# Patient Record
Sex: Male | Born: 1945 | Race: White | Hispanic: No | Marital: Married | Smoking: Former smoker
Health system: Southern US, Community
[De-identification: ages and names within clinical notes are randomized; demographics above are authoritative.]

## PROBLEM LIST (undated history)

## (undated) DIAGNOSIS — I639 Cerebral infarction, unspecified: Secondary | ICD-10-CM

## (undated) DIAGNOSIS — I350 Nonrheumatic aortic (valve) stenosis: Secondary | ICD-10-CM

## (undated) DIAGNOSIS — N183 Chronic kidney disease, stage 3 (moderate): Secondary | ICD-10-CM

## (undated) DIAGNOSIS — Z973 Presence of spectacles and contact lenses: Secondary | ICD-10-CM

## (undated) DIAGNOSIS — J111 Influenza due to unidentified influenza virus with other respiratory manifestations: Secondary | ICD-10-CM

## (undated) DIAGNOSIS — I77811 Abdominal aortic ectasia: Secondary | ICD-10-CM

## (undated) DIAGNOSIS — M1990 Unspecified osteoarthritis, unspecified site: Secondary | ICD-10-CM

## (undated) DIAGNOSIS — L409 Psoriasis, unspecified: Secondary | ICD-10-CM

## (undated) DIAGNOSIS — R55 Syncope and collapse: Secondary | ICD-10-CM

## (undated) DIAGNOSIS — I2510 Atherosclerotic heart disease of native coronary artery without angina pectoris: Secondary | ICD-10-CM

## (undated) DIAGNOSIS — G4730 Sleep apnea, unspecified: Secondary | ICD-10-CM

## (undated) DIAGNOSIS — I10 Essential (primary) hypertension: Secondary | ICD-10-CM

## (undated) DIAGNOSIS — K219 Gastro-esophageal reflux disease without esophagitis: Secondary | ICD-10-CM

## (undated) DIAGNOSIS — H25813 Combined forms of age-related cataract, bilateral: Secondary | ICD-10-CM

## (undated) DIAGNOSIS — R011 Cardiac murmur, unspecified: Secondary | ICD-10-CM

## (undated) DIAGNOSIS — H35033 Hypertensive retinopathy, bilateral: Secondary | ICD-10-CM

## (undated) DIAGNOSIS — H40003 Preglaucoma, unspecified, bilateral: Secondary | ICD-10-CM

## (undated) DIAGNOSIS — H43812 Vitreous degeneration, left eye: Secondary | ICD-10-CM

## (undated) DIAGNOSIS — Z860100 Personal history of colon polyps, unspecified: Secondary | ICD-10-CM

## (undated) DIAGNOSIS — I6529 Occlusion and stenosis of unspecified carotid artery: Secondary | ICD-10-CM

## (undated) DIAGNOSIS — R42 Dizziness and giddiness: Secondary | ICD-10-CM

## (undated) DIAGNOSIS — F341 Dysthymic disorder: Secondary | ICD-10-CM

## (undated) DIAGNOSIS — N529 Male erectile dysfunction, unspecified: Secondary | ICD-10-CM

## (undated) DIAGNOSIS — E785 Hyperlipidemia, unspecified: Secondary | ICD-10-CM

## (undated) DIAGNOSIS — Z87891 Personal history of nicotine dependence: Secondary | ICD-10-CM

## (undated) DIAGNOSIS — F482 Pseudobulbar affect: Secondary | ICD-10-CM

## (undated) DIAGNOSIS — J189 Pneumonia, unspecified organism: Secondary | ICD-10-CM

## (undated) HISTORY — PX: WISDOM TOOTH EXTRACTION: SHX21

## (undated) HISTORY — DX: Influenza due to unidentified influenza virus with other respiratory manifestations: J11.1

## (undated) HISTORY — DX: Dizziness and giddiness: R42

## (undated) HISTORY — PX: COLONOSCOPY W/ BIOPSIES AND POLYPECTOMY: SHX1376

## (undated) HISTORY — DX: Syncope and collapse: R55

## (undated) HISTORY — DX: Pneumonia, unspecified organism: J18.9

## (undated) HISTORY — DX: Male erectile dysfunction, unspecified: N52.9

## (undated) HISTORY — DX: Hyperlipidemia, unspecified: E78.5

## (undated) HISTORY — DX: Personal history of colonic polyps: Z86.010

## (undated) HISTORY — DX: Chronic kidney disease, stage 3 (moderate): N18.3

## (undated) HISTORY — DX: Dysthymic disorder: F34.1

## (undated) HISTORY — DX: Essential (primary) hypertension: I10

## (undated) HISTORY — DX: Preglaucoma, unspecified, bilateral: H40.003

## (undated) HISTORY — DX: Cerebral infarction, unspecified: I63.9

## (undated) HISTORY — DX: Combined forms of age-related cataract, bilateral: H25.813

## (undated) HISTORY — DX: Hypertensive retinopathy, bilateral: H35.033

## (undated) HISTORY — DX: Personal history of nicotine dependence: Z87.891

## (undated) HISTORY — DX: Abdominal aortic ectasia: I77.811

## (undated) HISTORY — PX: EYE SURGERY: SHX253

## (undated) HISTORY — DX: Personal history of colon polyps, unspecified: Z86.0100

## (undated) HISTORY — DX: Occlusion and stenosis of unspecified carotid artery: I65.29

## (undated) HISTORY — DX: Vitreous degeneration, left eye: H43.812

## (undated) HISTORY — DX: Pseudobulbar affect: F48.2

---

## 2013-12-30 ENCOUNTER — Encounter: Payer: Self-pay | Admitting: Emergency Medicine

## 2013-12-30 ENCOUNTER — Emergency Department
Admission: EM | Admit: 2013-12-30 | Discharge: 2013-12-30 | Disposition: A | Discharge status: Home / Self Care | Payer: PRIVATE HEALTH INSURANCE | Source: Home / Self Care | Attending: Family Medicine | Admitting: Family Medicine

## 2013-12-30 DIAGNOSIS — S61209A Unspecified open wound of unspecified finger without damage to nail, initial encounter: Secondary | ICD-10-CM

## 2013-12-30 DIAGNOSIS — IMO0001 Reserved for inherently not codable concepts without codable children: Secondary | ICD-10-CM

## 2013-12-30 HISTORY — DX: Psoriasis, unspecified: L40.9

## 2013-12-30 NOTE — Discharge Instructions (Signed)
Leave present dressing on wound for two days, then apply new Mepetel dressing and leave on for 3 days.  May apply Bacitracin ointment.  Keep wound clean and dry.  Return for any signs of infection (or follow-up with family doctor):  Increasing redness, swelling, pain, heat, drainage, etc. Return in 10 days for suture removal.     Laceration Care, Adult A laceration is a cut or lesion that goes through all layers of the skin and into the tissue just beneath the skin. TREATMENT  Some lacerations may not require closure. Some lacerations may not be able to be closed due to an increased risk of infection. It is important to see your caregiver as soon as possible after an injury to minimize the risk of infection and maximize the opportunity for successful closure. If closure is appropriate, pain medicines may be given, if needed. The wound will be cleaned to help prevent infection. Your caregiver will use stitches (sutures), staples, wound glue (adhesive), or skin adhesive strips to repair the laceration. These tools bring the skin edges together to allow for faster healing and a better cosmetic outcome. However, all wounds will heal with a scar. Once the wound has healed, scarring can be minimized by covering the wound with sunscreen during the day for 1 full year. HOME CARE INSTRUCTIONS  For sutures or staples:  Keep the wound clean and dry.  Wash the wound with soap and water. Rinse the wound off with water to remove all soap. Pat the wound dry with a clean towel.  After cleaning, apply a thin layer of the antibiotic ointment as recommended by your caregiver. This will help prevent infection and keep the dressing from sticking.   Do not soak the wound in water until the sutures are removed.  Only take over-the-counter or prescription medicines for pain, discomfort, or fever as directed by your caregiver.  Get your sutures or staples removed as directed by your caregiver.   You may need a  tetanus shot if:  You cannot remember when you had your last tetanus shot.  You have never had a tetanus shot. If you get a tetanus shot, your arm may swell, get red, and feel warm to the touch. This is common and not a problem. If you need a tetanus shot and you choose not to have one, there is a rare chance of getting tetanus. Sickness from tetanus can be serious. SEEK MEDICAL CARE IF:   You have redness, swelling, or increasing pain in the wound.  You see a red line that goes away from the wound.  You have yellowish-white fluid (pus) coming from the wound.  You have a fever.  You notice a bad smell coming from the wound or dressing.  Your wound breaks open before or after sutures have been removed.  You notice something coming out of the wound such as wood or glass.  Your wound is on your hand or foot and you cannot move a finger or toe. SEEK IMMEDIATE MEDICAL CARE IF:   Your pain is not controlled with prescribed medicine.  You have severe swelling around the wound causing pain and numbness or a change in color in your arm, hand, leg, or foot.  Your wound splits open and starts bleeding.  You have worsening numbness, weakness, or loss of function of any joint around or beyond the wound.  You develop painful lumps near the wound or on the skin anywhere on your body. MAKE SURE YOU:   Understand  these instructions.  Will watch your condition.  Will get help right away if you are not doing well or get worse. Document Released: 07/23/2005 Document Revised: 10/15/2011 Document Reviewed: 01/16/2011 [facility] Patient Information 2014 [place], [place].

## 2013-12-30 NOTE — ED Notes (Addendum)
Laceration to R ring finger from releasing a ratchet strap too quickly.   .Occured 12/29/13 @ 8 pm.  Bleeding stopped and site cleansed with Hibiclens. Tetanus UTD.

## 2013-12-30 NOTE — ED Provider Notes (Signed)
CSN: [id number]     [id number] date & time 12/30/13  1236 History   First MD Initiated Contact with Patient 12/30/13 1302     Chief Complaint  Patient presents with  . Extremity Laceration      HPI Comments: Patient injured his right fourth finger on the ratcheting mechanism of a tie-down strap.  Last Tdap was in 2012.  Patient is a 68 y.o. male presenting with skin laceration. The history is provided by the patient.  Laceration Location:  Finger Finger laceration location:  R ring finger Length (cm):  1.5 Depth:  Through dermis Bleeding: controlled   Time since incident:  2 hours Laceration mechanism:  Metal edge Pain details:    Quality:  Aching   Severity:  Mild   Timing:  Constant   Progression:  Unchanged Foreign body present:  No foreign bodies Relieved by:  Nothing Worsened by:  Movement Ineffective treatments:  None tried Tetanus status:  Up to date   Past Medical History  Diagnosis Date  . Psoriasis    History reviewed. No pertinent past surgical history. Family History  Problem Relation Age of Onset  . Cancer Mother     Uterine CA  . Cancer Father     Prostate   History  Substance Use Topics  . Smoking status: Former Games developermoker  . Smokeless tobacco: Never Used  . Alcohol Use: Yes    Review of Systems  All other systems reviewed and are negative.   Allergies  Review of patient's allergies indicates no known allergies.  Home Medications   Prior to Admission medications   Not on File   BP 187/80  Pulse 69  Temp(Src) 98.2 F (36.8 C) (Oral)  Ht 5' 8.5" (1.74 m)  Wt 188 lb (85.276 kg)  BMI 28.17 kg/m2  SpO2 99% Physical Exam  Nursing note and vitals reviewed. Constitutional: He is oriented to person, place, and time. He appears well-developed and well-nourished. No distress.  HENT:  Head: Atraumatic.  Eyes: Conjunctivae are normal. Pupils are equal, round, and reactive to light.  Musculoskeletal:       Right hand: He exhibits tenderness and  laceration. He exhibits normal range of motion, no bony tenderness, normal two-point discrimination, normal capillary refill, no deformity and no swelling. Normal sensation noted. Normal strength noted.       Hands: The palmar surface of the right 4th finger middle phalanx has a 1cm by 1.5cm simple flap laceration.  Finger has full range of motion.  Distal neurovascular function is intact.   Neurological: He is alert and oriented to person, place, and time.  Skin: Skin is warm and dry.    ED Course  Procedures  Laceration Repair Discussed benefits and risks of procedure and verbal consent obtained. Using sterile technique and digital 2% lidocaine without epinephrine, cleansed wound with Betadine followed by copious lavage with normal saline.  Wound carefully inspected for debris and foreign bodies; none found.  Wound closed with #3, 5-0 interrupted nylon sutures.   Non-stick Mepetel Lite dreessing applied.  Wound precautions explained to patient.  Return for suture removal in 10 days.           MDM   1. Laceration of fourth finger of right hand     Leave present dressing on wound for two days, then apply new Mepetel dressing and leave on for 3 days.  May apply Bacitracin ointment.  Keep wound clean and dry.  Return for any signs of infection (or follow-up with  family doctor):  Increasing redness, swelling, pain, heat, drainage, etc. Return in 10 days for suture removal.      Lattie Haw, MD 12/30/13 [phone]

## 2014-01-05 ENCOUNTER — Ambulatory Visit: Payer: PRIVATE HEALTH INSURANCE | Admitting: Family Medicine

## 2014-01-07 ENCOUNTER — Encounter: Payer: PRIVATE HEALTH INSURANCE | Admitting: Family Medicine

## 2014-01-09 ENCOUNTER — Encounter: Payer: Self-pay | Admitting: Emergency Medicine

## 2014-01-09 ENCOUNTER — Emergency Department
Admission: EM | Admit: 2014-01-09 | Discharge: 2014-01-09 | Disposition: A | Discharge status: Home / Self Care | Payer: PRIVATE HEALTH INSURANCE | Source: Home / Self Care | Attending: Family Medicine | Admitting: Family Medicine

## 2014-01-09 DIAGNOSIS — Z4802 Encounter for removal of sutures: Secondary | ICD-10-CM

## 2014-01-09 NOTE — Discharge Instructions (Signed)
Apply Bacitracin and bandage daily for two more days.  Begin finger stretching exercises.

## 2014-01-09 NOTE — ED Notes (Signed)
Here for removal of sutures from lateral R ring finger from 12/30/13.  Area cleaned with alcohol

## 2014-01-09 NOTE — ED Provider Notes (Signed)
CSN: [id number]     Arrival date & time 01/09/14  1019 History   First MD Initiated Contact with Patient 01/09/14 1039     Chief Complaint  Patient presents with  . Suture / Staple Removal      HPI Comments: Patient presents for suture removal.  He has no complaints.  The history is provided by the patient.    Past Medical History  Diagnosis Date  . Psoriasis   . Hypertension    No past surgical history on file. Family History  Problem Relation Age of Onset  . Cancer Mother     Uterine CA  . Cancer Father     Prostate   History  Substance Use Topics  . Smoking status: Former Games developer  . Smokeless tobacco: Never Used  . Alcohol Use: Yes    Review of Systems Right 4th finger has no pain.  No recent drainage from wound Allergies  Review of patient's allergies indicates no known allergies.  Home Medications   Prior to Admission medications   Medication Sig Start Date End Date Taking? Authorizing Provider  UNKNOWN TO PATIENT Topical ointment for Psoriasis    Historical Provider, MD   BP 162/84  Pulse 68  Temp(Src) 97.7 F (36.5 C) (Oral)  Ht 5' 8.5" (1.74 m)  Wt 190 lb 4 oz (86.297 kg)  BMI 28.50 kg/m2  SpO2 98% Physical Exam Nursing notes and Vital Signs reviewed. Appearance:  Patient appears healthy, stated age, and in no acute distress Right 4th finger:  Sutured wound has no drainage or swelling.  No erythema.  Finger has good range of motion.  ED Course  Procedures  none     MDM   1. Visit for suture removal; healing well.  No evidence infection   Sutures removed Bacitracin and bandage applied.  Apply Bacitracin and bandage daily for two more days.  Begin finger stretching exercises.   Lattie Haw, MD 01/09/14 1050

## 2014-09-16 ENCOUNTER — Emergency Department
Admission: EM | Admit: 2014-09-16 | Discharge: 2014-09-16 | Disposition: A | Discharge status: Home / Self Care | Payer: Medicare Other | Source: Home / Self Care | Attending: Emergency Medicine | Admitting: Emergency Medicine

## 2014-09-16 ENCOUNTER — Encounter: Payer: Self-pay | Admitting: *Deleted

## 2014-09-16 DIAGNOSIS — H109 Unspecified conjunctivitis: Secondary | ICD-10-CM | POA: Diagnosis not present

## 2014-09-16 MED ORDER — TOBRAMYCIN 0.3 % OP SOLN: 2.0000 [drp] | OPHTHALMIC | Status: DC

## 2014-09-16 NOTE — ED Notes (Signed)
Leonard Padilla c/o right eye pain and drainage x 3 days.

## 2014-09-16 NOTE — Discharge Instructions (Signed)

## 2014-09-16 NOTE — ED Provider Notes (Signed)
CSN: [id number]     Arrival date & time 09/16/14  1338 History   First MD Initiated Contact with Patient 09/16/14 1411     Chief Complaint  Patient presents with  . Eye Drainage   (Consider location/radiation/quality/duration/timing/severity/associated sxs/prior Treatment) Patient is a 69 y.o. male presenting with conjunctivitis. The history is provided by the patient. No language interpreter was used.  Conjunctivitis This is a new problem. The current episode started more than 2 days ago. The problem occurs constantly. The problem has not changed since onset.Nothing aggravates the symptoms. He has tried nothing for the symptoms. The treatment provided no relief.  normal eye exam and normal pressures 8 months ago  Past Medical History  Diagnosis Date  . Psoriasis    History reviewed. No pertinent past surgical history. Family History  Problem Relation Age of Onset  . Cancer Mother     Uterine CA  . Cancer Father     Prostate   History  Substance Use Topics  . Smoking status: Former Games developermoker  . Smokeless tobacco: Never Used  . Alcohol Use: Yes    Review of Systems  All other systems reviewed and are negative.   Allergies  Review of patient's allergies indicates no known allergies.  Home Medications   Prior to Admission medications   Medication Sig Start Date End Date Taking? Authorizing Provider  tobramycin (TOBREX) 0.3 % ophthalmic solution Place 2 drops into the right eye every 4 (four) hours. 09/16/14   Elson AreasLeslie K Elverta Dimiceli, PA-C  UNKNOWN TO PATIENT Topical ointment for Psoriasis    Historical Provider, MD   BP 162/96 mmHg  Pulse 84  Temp(Src) 97.8 F (36.6 C) (Oral)  Resp 14  Wt 204 lb (92.534 kg)  SpO2 99% Physical Exam  Constitutional: He is oriented to person, place, and time. He appears well-developed and well-nourished.  HENT:  Head: Normocephalic and atraumatic.  Eyes: Conjunctivae and EOM are normal. Pupils are equal, round, and reactive to light.  Injected  right conjunctiva  Neck: Normal range of motion.  Cardiovascular: Normal rate.   Pulmonary/Chest: Effort normal.  Abdominal: He exhibits no distension.  Musculoskeletal: Normal range of motion.  Neurological: He is alert and oriented to person, place, and time.  Skin: Skin is warm.  Psychiatric: He has a normal mood and affect.  Nursing note and vitals reviewed.   ED Course  Procedures (including critical care time) Labs Review Labs Reviewed - No data to display  Imaging Review No results found.   MDM   1. Conjunctivitis of right eye    Cool compresses Tobrex See your eye doctor for recheck if symptoms persist more than 3 days AVS   Elson AreasLeslie K Mosie Angus, PA-C 09/16/14 1439

## 2014-12-02 DIAGNOSIS — L409 Psoriasis, unspecified: Secondary | ICD-10-CM | POA: Insufficient documentation

## 2014-12-30 LAB — HM HEPATITIS C SCREENING LAB: HM Hepatitis Screen: NEGATIVE

## 2015-09-07 DIAGNOSIS — J189 Pneumonia, unspecified organism: Secondary | ICD-10-CM | POA: Insufficient documentation

## 2015-09-07 DIAGNOSIS — I639 Cerebral infarction, unspecified: Secondary | ICD-10-CM | POA: Insufficient documentation

## 2015-09-07 DIAGNOSIS — J111 Influenza due to unidentified influenza virus with other respiratory manifestations: Secondary | ICD-10-CM | POA: Insufficient documentation

## 2015-09-07 HISTORY — DX: Pneumonia, unspecified organism: J18.9

## 2015-09-07 HISTORY — DX: Influenza due to unidentified influenza virus with other respiratory manifestations: J11.1

## 2015-09-07 HISTORY — DX: Cerebral infarction, unspecified: I63.9

## 2015-09-30 LAB — LIPID PANEL
Cholesterol: 137 mg/dL (ref 0–200)
HDL: 43 mg/dL (ref 35–70)
LDL Cholesterol: 69 mg/dL
Triglycerides: 123 mg/dL (ref 40–160)

## 2015-09-30 LAB — BASIC METABOLIC PANEL
Calcium: 8.4 mg/dL
Creatinine: 1.1 mg/dL (ref <=1.3)
Glucose: 90 mg/dL
Potassium: 3.7 mmol/L (ref 3.4–5.3)

## 2015-09-30 LAB — COMPREHENSIVE METABOLIC PANEL
Alanine: 46
Alkaline Phosphatase: 66 U/L
Aspartate: 30

## 2015-09-30 LAB — CBC AND DIFFERENTIAL
Hemoglobin: 12.8 g/dL — AB (ref 13.5–17.5)
Platelets: 163 10*3/uL (ref 150–399)

## 2015-10-13 ENCOUNTER — Encounter: Payer: Self-pay | Admitting: Family Medicine

## 2015-10-13 ENCOUNTER — Ambulatory Visit (INDEPENDENT_AMBULATORY_CARE_PROVIDER_SITE_OTHER): Payer: Medicare Other | Admitting: Family Medicine

## 2015-10-13 VITALS — BP 131/86 | HR 86 | Wt 168.0 lb

## 2015-10-13 DIAGNOSIS — I10 Essential (primary) hypertension: Secondary | ICD-10-CM

## 2015-10-13 DIAGNOSIS — I6320 Cerebral infarction due to unspecified occlusion or stenosis of unspecified precerebral arteries: Secondary | ICD-10-CM

## 2015-10-13 DIAGNOSIS — K219 Gastro-esophageal reflux disease without esophagitis: Secondary | ICD-10-CM | POA: Insufficient documentation

## 2015-10-13 DIAGNOSIS — I639 Cerebral infarction, unspecified: Secondary | ICD-10-CM | POA: Insufficient documentation

## 2015-10-13 HISTORY — DX: Essential (primary) hypertension: I10

## 2015-10-13 LAB — LIPID PANEL
Cholesterol: 164 mg/dL (ref 125–200)
HDL: 53 mg/dL (ref >=40)
LDL Cholesterol: 78 mg/dL (ref <130)
Total CHOL/HDL Ratio: 3.1 Ratio (ref <=5.0)
Triglycerides: 167 mg/dL — ABNORMAL HIGH (ref <150)
VLDL: 33 mg/dL — ABNORMAL HIGH (ref <30)

## 2015-10-13 MED ORDER — OMEPRAZOLE 20 MG PO CPDR: 20.0000 mg | DELAYED_RELEASE_CAPSULE | Freq: Every day | ORAL | Status: DC

## 2015-10-13 NOTE — Progress Notes (Signed)
CC: Leonard Padilla is a 70 y.o. male is here for Establish Care   Subjective: HPI:  Very pleasant 70 year old here to establish care, husband of victoria.  Complains of epigastric burning that today is absent but yesterday in a few days beforehand was causing some mild discomfort. Pain was nonradiating and worse after eating. No interventions as of yet. He denies any regurgitation or nauseousness.   Earlier in February [place] was [place]. He had an episode where he woke up from sleeping and had lost strength in both legs. He was seen at a local urgent care and diagnosed with pneumonia and influenza A. The next day symptoms were worsening and he was seen in the emergency room and admitted. He was still having the weakness and after slurred speech and left-sided facial droop were noticed he had an MRI done which revealed an ischemic stroke. He was then started on 81 mg of aspirin daily, HCTZ and atorvastatin. He is taking his medication as prescribed. He denies any known side effects. He continues to unintentionally veer to the left when walking and has some residual weakness in his left hand. Family's request a referral to neurology which seems reasonable. He was not taking aspirin prior to the stroke.  Review Of Systems Outlined In HPI  Past Medical History  Diagnosis Date  . Psoriasis   . Essential hypertension 10/13/2015  . Hyperlipidemia   . Stroke [facility](HCC)     History reviewed. No pertinent past surgical history. Family History  Problem Relation Age of Onset  . Cancer Mother     Uterine CA  . Cancer Father     Prostate    Social History   Social History  . Marital Status: Married    Spouse Name: N/A  . Number of Children: N/A  . Years of Education: N/A   Occupational History  . Not on file.   Social History Main Topics  . Smoking status: Former Smoker    Quit date: 08/06/1984  . Smokeless tobacco: Never Used  . Alcohol Use: Yes  . Drug Use: No  . Sexual  Activity: Not Currently   Other Topics Concern  . Not on file   Social History Narrative     Objective: BP 131/86 mmHg  Pulse 86  Wt 168 lb (76.204 kg)  General: Alert and Oriented, No Acute Distress HEENT: Pupils equal, round, reactive to light. Conjunctivae clear.  Moist membranes pharynx unremarkable Lungs: Clear to auscultation bilaterally, no wheezing/ronchi/rales.  Comfortable work of breathing. Good air movement. Cardiac: Regular rate and rhythm. Normal S1/S2.  No murmurs, rubs, nor gallops.   Abdomen: Soft nontender Extremities: No peripheral edema.  Strong peripheral pulses.  Gait normal Mental Status: No depression, anxiety, nor agitation. Skin: Warm and dry.  Assessment & Plan: Leonard Padilla was seen today for establish care.  Diagnoses and all orders for this visit:  Gastroesophageal reflux disease, esophagitis presence not specified  Cerebrovascular accident (CVA) due to occlusion of precerebral artery (HCC) -     Lipid panel -     Ambulatory referral to Neurology -     Ambulatory referral to Physical Therapy  Essential hypertension -     Lipid panel  Other orders -     omeprazole (PRILOSEC) 20 MG capsule; Take 1 capsule (20 mg total) by mouth daily.   GERD: Uncontrolled, starting Prilosec for at least a month now. CVA: Continue aspirin and statin. Continue on hydrochlorothiazide blood pressure is acceptable today. Checking LDL cholesterol today.  Referral to physical therapy Assessment hypertension: Controlled   Return in about 3 months (around 01/13/2016).

## 2015-10-14 ENCOUNTER — Other Ambulatory Visit: Payer: Self-pay

## 2015-10-14 ENCOUNTER — Encounter: Payer: Self-pay | Admitting: Family Medicine

## 2015-10-14 MED ORDER — HYDROCHLOROTHIAZIDE 25 MG PO TABS: 25.0000 mg | ORAL_TABLET | Freq: Every day | ORAL | Status: DC

## 2015-10-14 MED ORDER — ATORVASTATIN CALCIUM 10 MG PO TABS: 10.0000 mg | ORAL_TABLET | Freq: Every day | ORAL | Status: DC

## 2015-10-17 ENCOUNTER — Other Ambulatory Visit: Payer: Self-pay | Admitting: Family Medicine

## 2015-10-17 NOTE — Progress Notes (Signed)
As of today patient has changes minor now would like physical therapy to come home due to difficulty with transporting to and from outpatient physical therapy in our building.

## 2015-10-17 NOTE — Addendum Note (Signed)
Addended by: Laren BoomHOMMEL, Matan Steen on: 10/17/2015 04:03 PM   Modules accepted: Orders

## 2015-10-18 ENCOUNTER — Encounter: Payer: Self-pay | Admitting: Family Medicine

## 2015-10-26 ENCOUNTER — Telehealth: Payer: Self-pay | Admitting: *Deleted

## 2015-10-26 ENCOUNTER — Ambulatory Visit: Payer: Medicare Other | Admitting: Diagnostic Neuroimaging

## 2015-10-26 NOTE — Telephone Encounter (Signed)
[place]ed to [place]; number no longer in service.

## 2015-10-26 NOTE — Telephone Encounter (Signed)
LVM requesting call back to reschedule due to provider being out of office today with illness.

## 2015-11-01 ENCOUNTER — Ambulatory Visit (INDEPENDENT_AMBULATORY_CARE_PROVIDER_SITE_OTHER): Payer: Medicare Other | Admitting: Diagnostic Neuroimaging

## 2015-11-01 ENCOUNTER — Encounter: Payer: Self-pay | Admitting: Neurology

## 2015-11-01 ENCOUNTER — Ambulatory Visit (INDEPENDENT_AMBULATORY_CARE_PROVIDER_SITE_OTHER): Payer: Self-pay | Admitting: Neurology

## 2015-11-01 ENCOUNTER — Encounter: Payer: Self-pay | Admitting: Diagnostic Neuroimaging

## 2015-11-01 VITALS — BP 124/80 | HR 78 | Resp 16 | Ht 68.0 in | Wt 173.0 lb

## 2015-11-01 VITALS — BP 148/81 | HR 79 | Ht 68.0 in | Wt 173.6 lb

## 2015-11-01 DIAGNOSIS — R351 Nocturia: Secondary | ICD-10-CM

## 2015-11-01 DIAGNOSIS — R0683 Snoring: Secondary | ICD-10-CM | POA: Diagnosis not present

## 2015-11-01 DIAGNOSIS — I10 Essential (primary) hypertension: Secondary | ICD-10-CM

## 2015-11-01 DIAGNOSIS — I63231 Cerebral infarction due to unspecified occlusion or stenosis of right carotid arteries: Secondary | ICD-10-CM | POA: Diagnosis not present

## 2015-11-01 DIAGNOSIS — E785 Hyperlipidemia, unspecified: Secondary | ICD-10-CM | POA: Diagnosis not present

## 2015-11-01 DIAGNOSIS — G4719 Other hypersomnia: Secondary | ICD-10-CM | POA: Diagnosis not present

## 2015-11-01 DIAGNOSIS — Z0289 Encounter for other administrative examinations: Secondary | ICD-10-CM

## 2015-11-01 DIAGNOSIS — R011 Cardiac murmur, unspecified: Secondary | ICD-10-CM

## 2015-11-01 DIAGNOSIS — R0681 Apnea, not elsewhere classified: Secondary | ICD-10-CM

## 2015-11-01 MED ORDER — CLOPIDOGREL BISULFATE 75 MG PO TABS: 75.0000 mg | ORAL_TABLET | Freq: Every day | ORAL | Status: DC

## 2015-11-01 NOTE — Progress Notes (Addendum)
Subjective:    Patient ID: Leonard Padilla is a 70 y.o. male.  HPI     Leonard Foley, MD, PhD [facility] [address] [address] [place], [place] [address]  Dear Satira Sark,   I saw your patient, Leonard Padilla, upon your kind request in my clinic today for initial consultation of his sleep disorder, concern for underlying obstructive sleep apnea. The patient is accompanied by his wife today. As you know, Leonard Padilla is a 70 year old right-handed gentleman with an underlying medical history of hypertension, hyperlipidemia, stroke in February 2017, pneumonia and influenza also in February 2017, psoriasis, and overweight state, who reports snoring and excessive daytime somnolence. His Epworth sleepiness score is 7 out of 24 today, his fatigue score is 24 out of 63.  I reviewed your office note from 11/01/2015.she has noted occasional pauses in his breathing. He was diagnosed with right basal ganglia acute stroke. He had a MRI brain on 09/27/2015. He had a CT angiography head and neck on 09/29/2015. These showed moderate stenosis of the right supraclinoid ICA, severe stenosis of the left vertebral artery, mild to moderate narrowing of the right vertebral artery, moderate right ICA stenosis, mild left ICA stenosis, mild to moderate right vertebral artery stenosis, mild left vertebral artery stenosis. Bilateral carotid ultrasound from 09/29/2015 showed 50-69% bilateral ICA stenosis. Transthoracic echocardiogram in February 2017 showed an EF of 65%, mild MR, mild AS.  His bedtime and waketime was variable prior to the stroke. Symptoms started on 09/22/2015 when he started having a cough. On 09/24/2014 he got up at night and fell and did not have the strength to get up. He was taken to urgent care. He was found to have the flu and was also diagnosed with pneumonia. He had some slurring of speech and weakness and was finally also diagnosed with stroke. Of note, he had not been compliant  with his blood pressure medication. He was not hydrating well. He was in [place] helping his youngest son fix his new home. He was physically active and also exerting himself but was not always hydrating well. He was not sleeping very well. He was in [place] for about 6 weeks altogether. Since history to [place] he has lost about 30 pounds. Snoring has since then improved. However, he does not sleep very well. In the past, he would not keep a set bedtime and wake time routine. Since his stroke he tries to be in bed between 10 and 11 PM. It may take him up to an hour and half to fall asleep. Wakeup time is around 8:30 AM. He does not have a family history of obstructive sleep apnea, denies restless leg symptoms, or leg twitching at night but has significant nocturia, about 3-4 times each night. He has also reduced his caffeine intake since the stroke and has increased his water intake. He tries to walk regularly and stay active physically. He has been compliant with his medications.  He quit smoking in 1985. He was a heavy smoker, 2-3 packs per day at the time. He drinks 2 glasses of wine per day, he denies using any illicit drugs, he drinks caffeine in the form of coffee, 3-4 cups per day. He has 2 grandchildren. He lives with his wife. He is retired. He has worked as a Automotive engineer other jobs.  His Past Medical History Is Significant For: Past Medical History  Diagnosis Date  . Psoriasis   . Essential hypertension 10/13/2015  . Hyperlipidemia   .  Stroke (HCC) 09/2015  . Flu 09/2015    influenza A  . Pneumonia 09/2015    His Past Surgical History Is Significant For: No past surgical history on file.  His Family History Is Significant For: Family History  Problem Relation Age of Onset  . Cancer Mother     ovarian cancer  . Cancer Father     Prostate    His Social History Is Significant For: Social History   Social History  . Marital Status: Married    Spouse Name: [name]  .  Number of Children: 2  . Years of Education: 13   Occupational History  . Retired     retired Clinical biochemist, Radiographer, therapeutic   Social History Main Topics  . Smoking status: Former Smoker    Quit date: 08/07/1983  . Smokeless tobacco: Never Used  . Alcohol Use: Yes     Comment: 2 glasses wine daily  . Drug Use: No  . Sexual Activity: Not Currently   Other Topics Concern  . None   Social History Narrative   Lives with wife   Caffeine use- coffee 3-4 cups daily    His Allergies Are:  No Known Allergies:   His Current Medications Are:  Outpatient Encounter Prescriptions as of 11/01/2015  Medication Sig  . aspirin 81 MG tablet Take 81 mg by mouth daily.  Marland Kitchen atorvastatin (LIPITOR) 10 MG tablet Take 1 tablet (10 mg total) by mouth daily.  . clopidogrel (PLAVIX) 75 MG tablet Take 1 tablet (75 mg total) by mouth daily.  . halobetasol (ULTRAVATE) 0.05 % ointment Apply topically 2 (two) times daily.  . hydrochlorothiazide (HYDRODIURIL) 25 MG tablet Take 1 tablet (25 mg total) by mouth daily.  Marland Kitchen omeprazole (PRILOSEC) 20 MG capsule Take 1 capsule (20 mg total) by mouth daily.  . psyllium (REGULOID) 0.52 g capsule Take 0.52 g by mouth daily.   No facility-administered encounter medications on file as of 11/01/2015.  :  Review of Systems:  Out of a complete 14 point review of systems, all are reviewed and negative with the exception of these symptoms as listed below:   Review of Systems  Neurological:       Patient reports that since his stroke he has had trouble falling asleep, wakes up many times during the night, Nocturia, snoring, witnessed apnea, wakes up feeling tired, denies taking naps.   Epworth Sleepiness Scale 0= would never doze 1= slight chance of dozing 2= moderate chance of dozing 3= high chance of dozing  Sitting and reading:1 Watching TV:3 Sitting inactive in a public place (ex. Theater or meeting):1 As a passenger in a car for an hour without a break:0 Lying down to  rest in the afternoon:2 Sitting and talking to someone:0 Sitting quietly after lunch (no alcohol):0 In a car, while stopped in traffic:0 Total:7   Objective:  Neurologic Exam  Physical Exam Physical Examination:   Filed Vitals:   11/01/15 1414  BP: 124/80  Pulse: 78  Resp: 16    General Examination: The patient is a very pleasant 70 y.o. male in no acute distress. He appears well-developed and well-nourished and well groomed.   HEENT: Normocephalic, atraumatic, pupils are equal, round and reactive to light and accommodation. Funduscopic exam is normal with sharp disc margins noted. Extraocular tracking is good without limitation to gaze excursion or nystagmus noted. Normal smooth pursuit is noted. Hearing is grossly intact. Tympanic membranes are clear bilaterally. Face is symmetric with normal facial animation and normal facial  sensation. Speech is clear with no dysarthria noted. There is no hypophonia. There is no lip, neck/head, jaw or voice tremor. Neck is supple with full range of passive and active motion. There are no carotid bruits on auscultation. Oropharynx exam reveals: mild mouth dryness, good dental hygiene and mild airway crowding, due to smaller airway entry and mildly redundant soft palate, otherwise a fairly patent airway, tonsils are small, Mallampati class I, tongue protrudes centrally and palate elevates symmetrically. Neck size is 16.5 inches. He has a Mild overbite. Nasal inspection reveals no significant nasal mucosal bogginess or redness and mild septal deviation to the left.   Chest: Clear to auscultation without wheezing, rhonchi or crackles noted.  Heart: S1+S2+0, regular and normal with 3/6 pansystolic heart murmur (not new, has had a murmur since he was 70 yo he says), no rubs or gallops noted.   Abdomen: Soft, non-tender and non-distended with normal bowel sounds appreciated on auscultation.  Extremities: There is no pitting edema in the distal lower  extremities bilaterally. Pedal pulses are intact.  Skin: Warm and dry without trophic changes noted, except for mild evidence of psoriasis in the extensor aspect of both lower legs. There are no varicose veins.  Musculoskeletal: exam reveals no obvious joint deformities, tenderness or joint swelling or erythema.   Neurologically:  Mental status: The patient is awake, alert and oriented in all 4 spheres. His immediate and remote memory, attention, language skills and fund of knowledge are appropriate. There is no evidence of aphasia, agnosia, apraxia or anomia. Speech is clear with normal prosody and enunciation. Thought process is linear. Mood is normal and affect is normal.  Cranial nerves II - XII are as described above under HEENT exam. In addition: shoulder shrug is normal with equal shoulder height noted. Motor exam: Normal bulk, strength and tone is noted. There is no drift, tremor or rebound. Romberg is negative. Reflexes are 1+ throughout. Babinski: Toes are flexor bilaterally. Fine motor skills and coordination: intact with normal finger taps, normal hand movements, normal rapid alternating patting, normal foot taps and normal foot agility.  Cerebellar testing: No dysmetria or intention tremor on finger to nose testing. Heel to shin is unremarkable bilaterally. There is no truncal or gait ataxia.  Sensory exam: intact to light touch, pinprick, vibration, temperature sense in the upper and lower extremities.  Gait, station and balance: He stands easily. No veering to one side is noted. No leaning to one side is noted. Posture is age-appropriate and stance is narrow based. Gait shows normal stride length and normal pace. No problems turning are noted. He turns en bloc. Tandem walk is very slightly challenging for him.  Assessment and Plan:   In summary, Josetta Huddleaul Chiou is a very pleasant 70 y.o.-year old male with an underlying medical history of hypertension, hyperlipidemia, stroke in February  2017, pneumonia and influenza also in February 2017, psoriasis, and overweight state, whose history and physical exam are indeed concerning for obstructive sleep apnea (OSA). I had a long chat with the patient and his wife about my findings and the diagnosis of OSA, its prognosis and treatment options. We talked about medical treatments, surgical interventions and non-pharmacological approaches. I explained in particular the risks and ramifications of untreated moderate to severe OSA, especially with respect to developing cardiovascular disease down the Road, including congestive heart failure, difficult to treat hypertension, cardiac arrhythmias, or stroke. Even type 2 diabetes has, in part, been linked to untreated OSA. Symptoms of untreated OSA include daytime sleepiness,  memory problems, mood irritability and mood disorder such as depression and anxiety, lack of energy, as well as recurrent headaches, especially morning headaches. We talked about trying to maintain a healthy lifestyle in general, as well as the importance of weight control. I encouraged the patient to eat healthy, exercise daily and keep well hydrated, to keep a scheduled bedtime and wake time routine, to not skip any meals and eat healthy snacks in between meals. I advised the patient not to drive when feeling sleepy. In addition, I talked to him about his stroke, secondary prevention for stroke, and recent test results, as well as the importance of medication and medical compliance. He is advised to further reduce his caffeine intake and increase his water intake, as well as limit his alcohol intake to perhaps 1 glass of wine per day, as opposed to 2 per day. I recommended the following at this time: sleep study with potential positive airway pressure titration. (We will score hypopneas at 4% and split the sleep study into diagnostic and treatment portion, if the estimated. 2 hour AHI is >20/h).   I explained the sleep test procedure to  the patient and also outlined possible surgical and non-surgical treatment options of OSA, including the use of a custom-made dental device (which would require a referral to a specialist dentist or oral surgeon), upper airway surgical options, such as pillar implants, radiofrequency surgery, tongue base surgery, and UPPP (which would involve a referral to an ENT surgeon). Rarely, jaw surgery such as mandibular advancement may be considered.  I also explained the CPAP treatment option to the patient, who indicated that he would be willing to try CPAP if the need arises. I explained the importance of being compliant with PAP treatment, not only for insurance purposes but primarily to improve His symptoms, and for the patient's long term health benefit, including to reduce His cardiovascular risks. I answered all their questions today and the patient and his wife were in agreement. I would like to see him back after the sleep study is completed and encouraged him to call with any interim questions, concerns, problems or updates.   Thank you very much for allowing me to participate in the care of this nice patient. If I can be of any further assistance to you please do not hesitate to talk to me. This visit was high in complexity.   Sincerely,   Leonard Foley, MD, PhD

## 2015-11-01 NOTE — Patient Instructions (Signed)
Thank you for coming to see [place] at [facility]. I hope we have been able to provide you high quality care today.  You may receive a patient satisfaction survey over the next few weeks. We would appreciate your feedback and comments so that we may continue to improve ourselves and the health of our patients.  - add plavix 25m to aspirin 860mdaily for 3 months; then will treat with plavix 75108maily alone - check sleep study - nutrition, physical activity reviewed   ~~~~~~~~~~~~~~~~~~~~~~~~~~~~~~~~~~~~~~~~~~~~~~~~~~~~~~~~~~~~~~~~~  DR. PENUMALLI'S GUIDE TO HAPPY AND HEALTHY LIVING These are some of my general health and wellness recommendations. Some of them may apply to you better than others. Please use common sense as you try these suggestions and feel free to ask me any questions.   ACTIVITY/FITNESS Mental, social, emotional and physical stimulation are very important for brain and body health. Try learning a new activity (arts, music, language, sports, games).  Keep moving your body to the best of your abilities. You can do this at home, inside or outside, the park, community center, gym or anywhere you like. Consider a physical therapist or personal trainer to get started. Consider the app Sworkit. Fitness trackers such as smart-watches, smart-phones or Fitbits can help as well.   NUTRITION Eat more plants: colorful vegetables, nuts, seeds and berries.  Eat less sugar, salt, preservatives and processed foods.  Avoid toxins such as cigarettes and alcohol.  Drink water when you are thirsty. Warm water with a slice of lemon is an excellent morning drink to start the day.  Consider these websites for more information The Nutrition Source (htthttps://www.henry-hernandez.biz/recision Nutrition (wwwWindowBlog.ch RELAXATION Consider practicing mindfulness meditation or other relaxation techniques such as deep breathing,  prayer, yoga, tai chi, massage. See website mindful.org or the apps Headspace or Calm to help get started.   SLEEP Try to get at least 7-8+ hours sleep per day. Regular exercise and reduced caffeine will help you sleep better. Practice good sleep hygeine techniques. See website sleep.org for more information.   PLANNING Prepare estate planning, living will, healthcare POA documents. Sometimes this is best planned with the help of an attorney. Theconversationproject.org and agingwithdignity.org are excellent resources.

## 2015-11-01 NOTE — Patient Instructions (Signed)

## 2015-11-01 NOTE — Progress Notes (Signed)
[facility]  PATIENT: Leonard Padilla DOB: 1945-10-12  REFERRING CLINICIAN: Laren Boom HISTORY FROM: patient and wife  REASON FOR VISIT: new consult    HISTORICAL  CHIEF COMPLAINT:  Chief Complaint  Patient presents with  . Cerebrovascular Accident    rm 7 New pt, wife- [name], hospital follow up to stroke, was in [place]    HISTORY OF PRESENT ILLNESS:   70 year old right-handed male here for evaluation of stroke. Patient has hypertension, hypercholesteremia, prior heavy tobacco user (quit 1985, prior 3 packs per day).  09/22/15 patient was visiting family in [place] when he developed cough and generalized weakness. In 09/26/15 patient fell out of bed several times. The next day he was severely ill and went to the hospital. He was diagnosed with influenza and pneumonia. Patient's son living in [place] had called to inquire how patient was doing. Patient's son raise possibility of stroke due to patient's left-sided weakness. Patient had CT and MRI of the brain which confirmed acute right brain subcortical ischemic infarction. Patient was found to have intracranial and extra cranial stenoses. Patient was started on aspirin, blood pressure control, statin. After a few days patient gradually improved symptoms. He was discharged home. Patient took a train back to [place].  Patient history of high blood pressure high cholesterol date back to his 40s. He was on medication for 15-20 years. Around age 57 years old he decided to stop all his medications on his own. In 2013 patient moved to [facility]. In 2016 he reestablished with a primary care doctor and was sporadic in taking his medications.  Since patient stroke in February 2017, he has improved 80% back to normal. Patient is taking his medications on a regular basis. Patient is trying to improve his nutrition, physical activity and general health habits.   REVIEW OF SYSTEMS: Full 14 system review of systems  performed and negative with exception of: Joint pain insomnia snoring impotence rash ringing in ears murmur.  ALLERGIES: No Known Allergies  HOME MEDICATIONS: Outpatient Prescriptions Prior to Visit  Medication Sig Dispense Refill  . aspirin 81 MG tablet Take 81 mg by mouth daily.    Marland Kitchen atorvastatin (LIPITOR) 10 MG tablet Take 1 tablet (10 mg total) by mouth daily. 90 tablet 0  . halobetasol (ULTRAVATE) 0.05 % ointment Apply topically 2 (two) times daily.    . hydrochlorothiazide (HYDRODIURIL) 25 MG tablet Take 1 tablet (25 mg total) by mouth daily. 90 tablet 0  . omeprazole (PRILOSEC) 20 MG capsule Take 1 capsule (20 mg total) by mouth daily. 30 capsule 0   No facility-administered medications prior to visit.    PAST MEDICAL HISTORY: Past Medical History  Diagnosis Date  . Psoriasis   . Essential hypertension 10/13/2015  . Hyperlipidemia   . Stroke (HCC) 09/2015  . Flu 09/2015    influenza A  . Pneumonia 09/2015    PAST SURGICAL HISTORY: History reviewed. No pertinent past surgical history.  FAMILY HISTORY: Family History  Problem Relation Age of Onset  . Cancer Mother     ovarian cancer  . Cancer Father     Prostate    SOCIAL HISTORY:  Social History   Social History  . Marital Status: Married    Spouse Name: [name]  . Number of Children: 2  . Years of Education: 13   Occupational History  .      retired Clinical biochemist, Radiographer, therapeutic   Social History Main Topics  . Smoking status: Former Smoker  Quit date: 08/07/1983  . Smokeless tobacco: Never Used  . Alcohol Use: Yes     Comment: 2 glasses wine daily  . Drug Use: No  . Sexual Activity: Not Currently   Other Topics Concern  . Not on file   Social History Narrative   Lives with wife   Caffeine use- coffee 3-4 cups daily     PHYSICAL EXAM  GENERAL EXAM/CONSTITUTIONAL: Vitals:  Filed Vitals:   11/01/15 0927  BP: 148/81  Pulse: 79  Height:  (1.727 m)  Weight: 173 lb 9.6 oz (78.744 kg)      Body mass index is 26.4 kg/(m^2).  Visual Acuity Screening   Right eye Left eye Both eyes  Without correction: 20/30 20/30   With correction:        Patient is in no distress; well developed, nourished and groomed; neck is supple  CARDIOVASCULAR:  Examination of carotid arteries is normal; BILATERAL (RIGHT > LEFT CAROTID BRUITS)  Regular rate and rhythm; SYSTOLIC MURMUR  Examination of peripheral vascular system by observation and palpation is normal  EYES:  Ophthalmoscopic exam of optic discs and posterior segments is normal; no papilledema or hemorrhages  MUSCULOSKELETAL:  Gait, strength, tone, movements noted in Neurologic exam below  NEUROLOGIC: MENTAL STATUS:  No flowsheet data found.  awake, alert, oriented to person, place and time  recent and remote memory intact  normal attention and concentration  language fluent, comprehension intact, naming intact,   fund of knowledge appropriate  CRANIAL NERVE:   2nd - no papilledema on fundoscopic exam  2nd, 3rd, 4th, 6th - pupils equal and reactive to light, visual fields full to confrontation, extraocular muscles intact, no nystagmus  5th - facial sensation symmetric  7th - facial strength --> SLIGHT DECR LEFT NASOLABIAL FOLD  8th - hearing intact  9th - palate elevates symmetrically, uvula midline  11th - shoulder shrug symmetric  12th - tongue protrusion midline  MOTOR:   normal bulk and tone, full strength in the BUE, BLE  SENSORY:   normal and symmetric to light touch, temperature, vibration  COORDINATION:   finger-nose-finger, fine finger movements normal  REFLEXES:   deep tendon reflexes present and symmetric  GAIT/STATION:   narrow based gait; able to walk on toes, tandem; romberg is negative    DIAGNOSTIC DATA (LABS, IMAGING, TESTING) - I reviewed patient records, labs, notes, testing and imaging myself where available.  Lab Results  Component Value Date   HGB 12.8*  09/30/2015   PLT 163 09/30/2015      Component Value Date/Time   K 3.7 09/30/2015   CREATININE 1.1 09/30/2015   CALCIUM 8.4 09/30/2015   ALKPHOS 66 09/30/2015   Lab Results  Component Value Date   CHOL 164 10/13/2015   HDL 53 10/13/2015   LDLCALC 78 10/13/2015   TRIG 167* 10/13/2015   CHOLHDL 3.1 10/13/2015   No results found for: HGBA1C No results found for: VITAMINB12 No results found for: TSH   09/27/15 MRI brain [I reviewed images myself and agree with interpretation. -VRP]  - acute right basal ganglia, periventricular white matter, frontal centrum semiovale, right insular non-hemorrhagic ischemic infarction - mild chronic small vessel ischemic disease   09/29/15 CTA head [I reviewed images myself and agree with interpretation. -VRP]  - moderate stenosis of right supraclinoid ICA - severe stenosis of left vertebral artery - mild-moderate narrowing of right vertebral artery  09/29/15 CTA neck [I reviewed images myself and agree with interpretation. -VRP]  - moderate  right ICA stenosis --> 50-60% stenosis with calcifications - mild left ICA stenosis - mild-moderate right vertebral artery stenosis - mild left vertebral artery stenosis  09/29/15 carotid u/s - 50-69% bilateral ICA stenosis - antegrade flow in vertebral arteries  Feb 2017 TTE - EF 65% - mild MR, mild AS     ASSESSMENT AND PLAN  70 y.o. year old male here with right brain subcortical ischemic infarction likely due to right ICA stenosis. Patient has tandem stenosis of right ICA in the right supraclinoid segment as well as right cervical proximal segment. Recommend aggressive risk factor management. We'll add Plavix 75 mg to aspirin 81 mg for 3 months and then go to single antiplatelet regimen. Will check sleep study to rule out obstructive sleep apnea. Will refer patient to vascular surgery for consideration of revascularization options of right internal carotid artery stenosis.   Dx: symptomatic right  ICA stenosis with right brain infarcts (Feb 2017)  1. 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 Accelerated hypertension   3. Hyperlipidemia   4. Snoring   5. Excessive daytime sleepiness      PLAN: - add plavix 75mg  to aspirin 81mg  daily for 3 months; then will treat with plavix 75mg  daily alone - check sleep study - nutrition, physical activity reviewed - will consider revascularization of right ICA (will refer to vascular surgery)   Orders Placed This Encounter  Procedures  . Ambulatory referral to Sleep Studies  . Ambulatory referral to Vascular Surgery   Meds ordered this encounter  Medications  . clopidogrel (PLAVIX) 75 MG tablet    Sig: Take 1 tablet (75 mg total) by mouth daily.    Dispense:  30 tablet    Refill:  11   Return in about 6 weeks (around 12/13/2015).  I reviewed images, labs, notes, records myself. I summarized findings and reviewed with patient, for this high risk condition (stroke, HTN, carotid stenosis) requiring high complexity decision making.    Suanne MarkerVIKRAM R. Keiston Manley, MD 11/01/2015, 9:45 AM Certified in Neurology, Neurophysiology and Neuroimaging  [facility] [address], [address] [place], [place] [address] [phone]

## 2015-11-07 ENCOUNTER — Encounter: Payer: Self-pay | Admitting: *Deleted

## 2015-11-17 ENCOUNTER — Encounter: Payer: Self-pay | Admitting: Surgery

## 2015-11-22 ENCOUNTER — Encounter: Payer: Self-pay | Admitting: Surgery

## 2015-11-22 ENCOUNTER — Ambulatory Visit (INDEPENDENT_AMBULATORY_CARE_PROVIDER_SITE_OTHER): Payer: Medicare Other | Admitting: Surgery

## 2015-11-22 VITALS — BP 136/86 | HR 82 | Temp 97.4°F | Resp 16 | Ht 69.0 in | Wt 171.0 lb

## 2015-11-22 DIAGNOSIS — I6521 Occlusion and stenosis of right carotid artery: Secondary | ICD-10-CM

## 2015-11-22 NOTE — Progress Notes (Signed)
[facility] [place]  Patient name: Leonard Padilla MRN: [id number] DOB: 10/11/1945 Sex: male  REASON FOR CONSULT: Carotid Stenosis Referring Physician:   Dr. Danae OrleansPenumali  HPI: Leonard Huddleaul Cerniglia is a 70 y.o. male, who is referred for management of carotid stenosis in the setting of a stroke.  In February 2017, while visiting family in [place], the patient developed a cough and weakness.  He was diagnosed with influenza a and pneumonia.  Also at that time he began having symptoms of left-sided weakness and slurred speech and facial drooping.  Imaging studies revealed an acute right brain subcortical ischemic infarct.  He was also found to have intracranial and extracranial stenosis.  Several years ago, the patient stopped taking his medications, after this event he was started back on a statin for hypercholesterolemia medications for his hypertension as well as an aspirin and Plavix.  His symptoms have nearly resolved.  He no longer has left arm or leg symptoms.  He will occasionally have some difficulty with cognitive issues but these are getting better.  The patient has a history of smoking this was many years ago.    Past Medical History  Diagnosis Date  . Psoriasis   . Essential hypertension 10/13/2015  . Hyperlipidemia   . Stroke (HCC) 09/2015  . Flu 09/2015    influenza A  . Pneumonia 09/2015    Family History  Problem Relation Age of Onset  . Cancer Mother     ovarian cancer  . Cancer Father     Prostate    SOCIAL HISTORY: Social History   Social History  . Marital Status: Married    Spouse Name: [name]  . Number of Children: 2  . Years of Education: 13   Occupational History  . 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 Smoking status: Former Smoker    Quit date: 08/07/1983  . Smokeless tobacco: Never Used  . Alcohol Use: Yes     Comment: 2 glasses wine daily  . Drug Use: No  . Sexual Activity: Not Currently   Other  Topics Concern  . Not on file   Social History Narrative   Lives with wife   Caffeine use- coffee 3-4 cups daily    No Known Allergies  Current Outpatient Prescriptions  Medication Sig Dispense Refill  . aspirin 81 MG tablet Take 81 mg by mouth daily.    Marland Kitchen. atorvastatin (LIPITOR) 10 MG tablet Take 1 tablet (10 mg total) by mouth daily. 90 tablet 0  . clopidogrel (PLAVIX) 75 MG tablet Take 1 tablet (75 mg total) by mouth daily. 30 tablet 11  . halobetasol (ULTRAVATE) 0.05 % ointment Apply topically 2 (two) times daily.    . hydrochlorothiazide (HYDRODIURIL) 25 MG tablet Take 1 tablet (25 mg total) by mouth daily. 90 tablet 0  . omeprazole (PRILOSEC) 20 MG capsule Take 1 capsule (20 mg total) by mouth daily. 30 capsule 0  . psyllium (REGULOID) 0.52 g capsule Take 0.52 g by mouth daily.     No current facility-administered medications for this visit.    REVIEW OF SYSTEMS:  [X]  denotes positive finding, [ ]  denotes negative finding Cardiac  Comments:  Chest pain or chest pressure:    Shortness of breath upon exertion:    Short of breath when lying flat:    Irregular heart rhythm:        Vascular    Pain in calf, thigh, or  hip brought on by ambulation:    Pain in feet at night that wakes you up from your sleep:     Blood clot in your veins:    Leg swelling:         Pulmonary    Oxygen at home:    Productive cough:     Wheezing:         Neurologic    Sudden weakness in arms or legs:     Sudden numbness in arms or legs:     Sudden onset of difficulty speaking or slurred speech:    Temporary loss of vision in one eye:     Problems with dizziness:         Gastrointestinal    Blood in stool:     Vomited blood:         Genitourinary    Burning when urinating:     Blood in urine:        Psychiatric    Major depression:         Hematologic    Bleeding problems:    Problems with blood clotting too easily:        Skin    Rashes or ulcers: Psoriasis         Constitutional    Fever or chills:      PHYSICAL EXAM: Filed Vitals:   11/22/15 0959 11/22/15 1002  BP: 137/82 136/86  Pulse: 82 82  Temp: 97.4 F (36.3 C)   TempSrc: Oral   Resp: 16   Height: 5' 9" (1.753 m)   Weight: 171 lb (77.565 kg)   SpO2: 99%     GENERAL: The patient is a well-nourished male, in no acute distress. The vital signs are documented above. CARDIAC: There is a regular rate and rhythm.  VASCULAR: Systolic ejection murmur which radiates into both carotid arteries  PULMONARY: There is good air exchange bilaterally without wheezing or rales. ABDOMEN: Soft and non-tender with normal pitched bowel sounds.  MUSCULOSKELETAL: There are no major deformities or cyanosis. NEUROLOGIC: No focal weakness or paresthesias are detected. SKIN: There are no ulcers or rashes noted. PSYCHIATRIC: The patient has a normal affect.  DATA:  Have personally reviewed the images for the CT scan.  The carotid bifurcation is in the mid neck.  This approximates 60-70% right carotid stenosis.    MEDICAL ISSUES: Symptomatic carotid stenosis: I discussed with the patient proceeding with right carotid endarterectomy for stroke prevention.  In addition, we will continue with maximal medical therapy, which he is on.  We discussed the risks and benefits surgery including the risk of incisional numbness, stroke, nerve injury, cardiopulmonary couple cases, and bleeding.  I will not stop his Plavix.  I'm going to send the patient for cardiac clearance.  He wants to continue to think about whether or not he wishes to proceed with surgery.  He understands the pros and cons of medical management versus intervention.  He will contact me after his cardiology visit.   Javelle Donigan, [place] [facility] [place] Beeper: [phone]     

## 2015-11-24 ENCOUNTER — Ambulatory Visit (INDEPENDENT_AMBULATORY_CARE_PROVIDER_SITE_OTHER): Payer: Medicare Other | Admitting: Neurology

## 2015-11-24 DIAGNOSIS — G4733 Obstructive sleep apnea (adult) (pediatric): Secondary | ICD-10-CM

## 2015-11-24 DIAGNOSIS — G4720 Circadian rhythm sleep disorder, unspecified type: Secondary | ICD-10-CM

## 2015-11-24 DIAGNOSIS — G479 Sleep disorder, unspecified: Secondary | ICD-10-CM

## 2015-11-29 ENCOUNTER — Telehealth: Payer: Self-pay | Admitting: Neurology

## 2015-11-29 ENCOUNTER — Encounter: Payer: Self-pay | Admitting: Family Medicine

## 2015-11-29 DIAGNOSIS — G4733 Obstructive sleep apnea (adult) (pediatric): Secondary | ICD-10-CM

## 2015-11-29 DIAGNOSIS — G479 Sleep disorder, unspecified: Secondary | ICD-10-CM

## 2015-11-29 DIAGNOSIS — G4720 Circadian rhythm sleep disorder, unspecified type: Secondary | ICD-10-CM

## 2015-11-29 HISTORY — DX: Obstructive sleep apnea (adult) (pediatric): G47.33

## 2015-11-29 NOTE — Telephone Encounter (Signed)
Patient referred by Dr. Marjory LiesPenumalli, seen by me on 11/01/15, diagnostic PSG on 11/24/15, ins: [employer] MCR.   Please call and notify the patient that the recent sleep study did confirm the diagnosis of near moderate to severe obstructive sleep apnea and that I recommend treatment for this in the form of CPAP. This will require a repeat sleep study for proper titration and mask fitting. Please explain to patient and arrange for a CPAP titration study. I have placed an order in the chart. Thanks, and please [facility] for scheduling next sleep study.  Huston FoleySaima Jastin Fore, MD, PhD [facility]

## 2015-11-29 NOTE — Telephone Encounter (Signed)
Called pt. Relayed results per Dr Frances FurbishAthar note below. He would like to move forward with second sleep study. Advised I will let Dawn know and she will call and get second sleep study scheduled. He verbalized understanding. Told him to call if he has further questions/concerns. He verbalized understanding.

## 2015-11-30 ENCOUNTER — Encounter: Payer: Self-pay | Admitting: Cardiology

## 2015-11-30 ENCOUNTER — Ambulatory Visit (INDEPENDENT_AMBULATORY_CARE_PROVIDER_SITE_OTHER): Payer: Medicare Other | Admitting: Cardiology

## 2015-11-30 VITALS — BP 120/90 | HR 98 | Ht 69.0 in | Wt 171.2 lb

## 2015-11-30 DIAGNOSIS — Z01818 Encounter for other preprocedural examination: Secondary | ICD-10-CM | POA: Diagnosis not present

## 2015-11-30 NOTE — Progress Notes (Signed)
[name]   Date:  11/30/2015   ID:  Leonard Padilla, DOB Nov 12, 1945, MRN [id number]  PCP:  Leonard BoomHommel, Sean, DO  Cardiologist:   Leonard Barletta Jorja LoaMartin Jahred Tatar, MD    Chief Complaint  Patient presents with  . New Patient (Initial Visit)    cardiac clearance     History of Present Illness: Leonard Padilla is a 70 y.o. male who presents today for cardiology evaluation.   He has carotid stenosis in the setting of stroke.  In February 2017, he was visiting [place] influenza A and pneumonia. During that time, he was found to have left-sided weakness with slurred speech and facial drooping. Imaging studies showed a right brain subcortical ischemic infarct. He was found to have intracranial and extracranial carotid stenosis. Several years ago, he stopped taking his medications, but was started back on a statin, aspirin and Plavix during the time of this episode. Since that time, he has recovered well. He has not had any recent chest pain, shortness of breath, PND, orthopnea. He sleeps on one pillow and does not wake up at night feeling short of breath. He says that he does get short of breath when he exerts himself but he does not feel like his shortness of breath is worse then his friends that are exercising with him.   Today, he denies symptoms of palpitations, chest pain, shortness of breath, orthopnea, PND, lower extremity edema, claudication, dizziness, presyncope, syncope, bleeding, or neurologic sequela. The patient is tolerating medications without difficulties and is otherwise without complaint today.    Past Medical History  Diagnosis Date  . Psoriasis   . Essential hypertension 10/13/2015  . Hyperlipidemia   . Stroke (HCC) 09/2015  . Flu 09/2015    influenza A  . Pneumonia 09/2015   Past Surgical History  Procedure Laterality Date  . No past surgeries       Current Outpatient Prescriptions  Medication Sig Dispense Refill  . aspirin 81 MG tablet Take 81 mg by mouth  daily.    Marland Kitchen. atorvastatin (LIPITOR) 10 MG tablet Take 1 tablet (10 mg total) by mouth daily. 90 tablet 0  . clopidogrel (PLAVIX) 75 MG tablet Take 1 tablet (75 mg total) by mouth daily. 30 tablet 11  . halobetasol (ULTRAVATE) 0.05 % ointment Apply topically 2 (two) times daily.    . hydrochlorothiazide (HYDRODIURIL) 25 MG tablet Take 1 tablet (25 mg total) by mouth daily. 90 tablet 0  . omeprazole (PRILOSEC) 20 MG capsule Take 1 capsule (20 mg total) by mouth daily. 30 capsule 0  . psyllium (REGULOID) 0.52 g capsule Take 0.52 g by mouth daily.     No current facility-administered medications for this visit.    Allergies:   Review of patient's allergies indicates no known allergies.   Social History:  The patient  reports that he quit smoking about 32 years ago. He has never used smokeless tobacco. He reports that he drinks alcohol. He reports that he does not use illicit drugs.   Family History:  The patient's family history includes Cancer in his father and mother.    ROS:  Please see the history of present illness.   Otherwise, review of systems is positive for rash (psoriasis).   All other systems are reviewed and negative.    PHYSICAL EXAM: VS:  BP 120/90 mmHg  Pulse 98  Ht 5\' 9"  (1.753 m)  Wt 171 lb 3.2 oz (77.656 kg)  BMI 25.27 kg/m2 , BMI  Body mass index is 25.27 kg/(m^2). GEN: Well nourished, well developed, in no acute distress HEENT: normal Neck: no JVD, carotid bruits, or masses Cardiac: RRR; no murmurs, rubs, or gallops,no edema  Respiratory:  clear to auscultation bilaterally, normal work of breathing GI: soft, nontender, nondistended, + BS MS: no deformity or atrophy Skin: warm and dry Neuro:  Strength and sensation are intact Psych: euthymic mood, full affect  EKG:  EKG is ordered today. The ekg ordered today shows sinus rhythm, inferior Q waves, lateral Q waves, rate 98  Recent Labs: 09/30/2015: Creatinine 1.1; Hemoglobin 12.8*; Platelets 163; Potassium 3.7     Lipid Panel     Component Value Date/Time   CHOL 164 10/13/2015 1145   TRIG 167* 10/13/2015 1145   HDL 53 10/13/2015 1145   CHOLHDL 3.1 10/13/2015 1145   VLDL 33* 10/13/2015 1145   LDLCALC 78 10/13/2015 1145     Wt Readings from Last 3 Encounters:  11/30/15 171 lb 3.2 oz (77.656 kg)  11/22/15 171 lb (77.565 kg)  11/01/15 173 lb (78.472 kg)      Other studies Reviewed: Additional studies/ records that were reviewed today include: Epic notes    ASSESSMENT AND PLAN:  1.  Pre surgical evaluation: Currently, he is not having chest pain, shortness of breath, PND, or orthopnea. He does not have any signs or symptoms of heart failure or coronary artery disease. He currently has plans for carotid endarterectomy. He does have abnormalities on his EKG with inferior and lateral Q waves. We'll get an echocardiogram to determine if he has any structural heart disease. If he does not, he would be at intermediate risk for an intermediate to high risk procedure. He is on a statin for his hyperlipidemia. Would continue the statin through the periprocedural time.    Current medicines are reviewed at length with the patient today.   The patient does not have concerns regarding his medicines.  The following changes were made today:  none  Labs/ tests ordered today include:  No orders of the defined types were placed in this encounter.     Disposition:   FU with Treylan Mcclintock PRN post TTE  Signed, Zaharah Amir Jorja Loa, MD  11/30/2015 3:03 PM     [facility] [address] [place] [address] [phone] (office) [phone] (fax)

## 2015-11-30 NOTE — Patient Instructions (Signed)
Medication Instructions:  Your physician recommends that you continue on your current medications as directed. Please refer to the Current Medication list given to you today.   Labwork: None ordered   Testing/Procedures: Your physician has requested that you have an echocardiogram. Echocardiography is a painless test that uses sound waves to create images of your heart. It provides your doctor with information about the size and shape of your heart and how well your heart's chambers and valves are working. This procedure takes approximately one hour. There are no restrictions for this procedure.    Follow-Up: Your physician recommends that you schedule a follow-up appointment as needed.  Will call you with the results of your echo and clear for surgery based on results.   Any Other Special Instructions Will Be Listed Below (If Applicable).     If you need a refill on your cardiac medications before your next appointment, please call your pharmacy.

## 2015-12-01 ENCOUNTER — Ambulatory Visit (HOSPITAL_COMMUNITY): Payer: Medicare Other | Attending: Cardiology

## 2015-12-01 ENCOUNTER — Other Ambulatory Visit: Payer: Self-pay

## 2015-12-01 ENCOUNTER — Ambulatory Visit (INDEPENDENT_AMBULATORY_CARE_PROVIDER_SITE_OTHER): Payer: Medicare Other | Admitting: Neurology

## 2015-12-01 DIAGNOSIS — E785 Hyperlipidemia, unspecified: Secondary | ICD-10-CM | POA: Insufficient documentation

## 2015-12-01 DIAGNOSIS — Z87891 Personal history of nicotine dependence: Secondary | ICD-10-CM | POA: Diagnosis not present

## 2015-12-01 DIAGNOSIS — Z01810 Encounter for preprocedural cardiovascular examination: Secondary | ICD-10-CM | POA: Diagnosis not present

## 2015-12-01 DIAGNOSIS — G4733 Obstructive sleep apnea (adult) (pediatric): Secondary | ICD-10-CM

## 2015-12-01 DIAGNOSIS — G479 Sleep disorder, unspecified: Secondary | ICD-10-CM

## 2015-12-01 DIAGNOSIS — I350 Nonrheumatic aortic (valve) stenosis: Secondary | ICD-10-CM | POA: Diagnosis not present

## 2015-12-01 DIAGNOSIS — Z01818 Encounter for other preprocedural examination: Secondary | ICD-10-CM | POA: Diagnosis not present

## 2015-12-01 DIAGNOSIS — I10 Essential (primary) hypertension: Secondary | ICD-10-CM | POA: Diagnosis not present

## 2015-12-01 DIAGNOSIS — G4720 Circadian rhythm sleep disorder, unspecified type: Secondary | ICD-10-CM

## 2015-12-05 ENCOUNTER — Other Ambulatory Visit: Payer: Self-pay

## 2015-12-06 ENCOUNTER — Telehealth: Payer: Self-pay | Admitting: Neurology

## 2015-12-06 ENCOUNTER — Telehealth: Payer: Self-pay | Admitting: Family Medicine

## 2015-12-06 DIAGNOSIS — G4733 Obstructive sleep apnea (adult) (pediatric): Secondary | ICD-10-CM

## 2015-12-06 NOTE — Telephone Encounter (Signed)
[place] gave results and recommendations. She states that patient wants to proceed with treatment. [facility] I will send report to PCP and send the patient a letter reminding him to make f/u appt and stress the importance of compliance.

## 2015-12-06 NOTE — Telephone Encounter (Signed)
cpap started

## 2015-12-06 NOTE — Telephone Encounter (Signed)
Patient referred by Dr. Marjory LiesPenumalli, seen by me on 11/01/15, diagnostic PSG on 11/24/15, CPAP study on 12/01/15, ins: [employer] MCR.  Please call and inform patient that I have entered an order for treatment with positive airway pressure (PAP) treatment of obstructive sleep apnea (OSA). He did well during the latest sleep study with CPAP. We will, therefore, arrange for a machine for home use through a DME (durable medical equipment) company of His choice; and I will see the patient back in follow-up in about 8-10 weeks. Please also explain to the patient that I will be looking out for compliance data, which can be downloaded from the machine (stored on an SD card, that is inserted in the machine) or via remote access through a modem, that is built into the machine. At the time of the followup appointment we will discuss sleep study results and how it is going with PAP treatment at home. Please advise patient to bring His machine at the time of the first FU visit, even though this is cumbersome. Bringing the machine for every visit after that will likely not be needed, but often helps for the first visit to troubleshoot if needed. Please re-enforce the importance of compliance with treatment and the need for us to monitor compliance data - often an insurance requirement and actually good feedback for the patient as far as how they are doing.  Also remind patient, that any interim PAP machine or mask issues should be first addressed with the DME company, as they can often help better with technical and mask fit issues. Please ask if patient has a preference regarding DME company.  Please also make sure, the patient has a follow-up appointment with me in about 8-10 weeks from the setup date, thanks.  Once you have spoken to the patient - and faxed/routed report to PCP and referring MD (if other than PCP), you can close this encounter, thanks,   Huston FoleySaima Kristol Almanzar, MD, PhD [facility] ([facility])

## 2015-12-07 ENCOUNTER — Telehealth: Payer: Self-pay | Admitting: *Deleted

## 2015-12-07 NOTE — Telephone Encounter (Signed)
Patient echo results reviewed by Dr. Elberta Fortisamnitz.  Advised to follow with general cardiology in 6 months to follow moderate aortic stenosis. Recall placed in EPIC to establish care with Dr. Clifton JamesMcAlhany in 6 months (reviewed with his nurse, Dennie BiblePat)

## 2015-12-12 ENCOUNTER — Encounter (HOSPITAL_COMMUNITY): Payer: Self-pay

## 2015-12-12 ENCOUNTER — Encounter (HOSPITAL_COMMUNITY)
Admission: RE | Admit: 2015-12-12 | Discharge: 2015-12-12 | Disposition: A | Discharge status: Home / Self Care | Payer: Medicare Other | Source: Ambulatory Visit | Attending: Surgery | Admitting: Surgery

## 2015-12-12 ENCOUNTER — Ambulatory Visit: Payer: Self-pay | Admitting: Family Medicine

## 2015-12-12 HISTORY — DX: Unspecified osteoarthritis, unspecified site: M19.90

## 2015-12-12 HISTORY — DX: Nonrheumatic aortic (valve) stenosis: I35.0

## 2015-12-12 HISTORY — DX: Gastro-esophageal reflux disease without esophagitis: K21.9

## 2015-12-12 HISTORY — DX: Sleep apnea, unspecified: G47.30

## 2015-12-12 HISTORY — DX: Cardiac murmur, unspecified: R01.1

## 2015-12-12 LAB — CBC
HCT: 42.1 % (ref 39.0–52.0)
Hemoglobin: 13.6 g/dL (ref 13.0–17.0)
MCH: 29.8 pg (ref 26.0–34.0)
MCHC: 32.3 g/dL (ref 30.0–36.0)
MCV: 92.3 fL (ref 78.0–100.0)
Platelets: 230 10*3/uL (ref 150–400)
RBC: 4.56 MIL/uL (ref 4.22–5.81)
RDW: 13.1 % (ref 11.5–15.5)
WBC: 6 10*3/uL (ref 4.0–10.5)

## 2015-12-12 LAB — COMPREHENSIVE METABOLIC PANEL
ALT: 30 U/L (ref 17–63)
AST: 28 U/L (ref 15–41)
Albumin: 4.2 g/dL (ref 3.5–5.0)
Alkaline Phosphatase: 73 U/L (ref 38–126)
Anion gap: 11 (ref 5–15)
BUN: 20 mg/dL (ref 6–20)
CO2: 30 mmol/L (ref 22–32)
Calcium: 9.9 mg/dL (ref 8.9–10.3)
Chloride: 97 mmol/L — ABNORMAL LOW (ref 101–111)
Creatinine, Ser: 1.54 mg/dL — ABNORMAL HIGH (ref 0.61–1.24)
GFR calc Af Amer: 51 mL/min — ABNORMAL LOW (ref >60)
GFR calc non Af Amer: 44 mL/min — ABNORMAL LOW (ref >60)
Glucose, Bld: 103 mg/dL — ABNORMAL HIGH (ref 65–99)
Potassium: 4.2 mmol/L (ref 3.5–5.1)
Sodium: 138 mmol/L (ref 135–145)
Total Bilirubin: 0.9 mg/dL (ref 0.3–1.2)
Total Protein: 7.7 g/dL (ref 6.5–8.1)

## 2015-12-12 LAB — TYPE AND SCREEN
ABO/RH(D): A POS
Antibody Screen: NEGATIVE

## 2015-12-12 LAB — APTT: aPTT: 31 seconds (ref 24–37)

## 2015-12-12 LAB — PROTIME-INR
INR: 1.02 (ref 0.00–1.49)
Prothrombin Time: 13.6 seconds (ref 11.6–15.2)

## 2015-12-12 LAB — SURGICAL PCR SCREEN
MRSA, PCR: NEGATIVE
Staphylococcus aureus: POSITIVE — AB

## 2015-12-12 LAB — ABO/RH: ABO/RH(D): A POS

## 2015-12-12 NOTE — Progress Notes (Signed)
I called a prescription for Mupiro[place] ointment to [facility], [place]

## 2015-12-12 NOTE — Pre-Procedure Instructions (Addendum)
Leonard Padilla  12/12/2015    Your procedure is scheduled on Thursday, May 11.  [facility] 7:30 A.M.   Call this number if you have problems the morning of surgery:[phone]     Remember:  Do not eat food or drink liquids after midnight Wednesday, May 10. ,   Take these medicines the morning of surgery with A SIP OF WATER : Aspirin                  Clopidrogrel (Plavix) as instructed by  Dr Leonard Padilla.                  DO not take any Ibuprofen, Vitamins, Herbal Products, Naproxex starting now   Do not wear jewelry, make-up or nail polish.  Do not wear lotions, powders, or perfumes.              Men may shave face and neck.  Do not bring valuables to the hospital.  [facility] is not responsible for any belongings or valuables.  Contacts, dentures or bridgework may not be worn into surgery.  Leave your suitcase in the car.  After surgery it may be brought to your room.  For patients admitted to the hospital, discharge time will be determined by your treatment team.  Special instructions:   Special Instructions: Leonard Padilla - Preparing for Surgery  Before surgery, you can play an important role.  Because skin is not sterile, your skin needs to be as free of germs as possible.  You can reduce the number of germs on you skin by washing with CHG (chlorahexidine gluconate) soap before surgery.  CHG is an antiseptic cleaner which kills germs and bonds with the skin to continue killing germs even after washing.  Please DO NOT use if you have an allergy to CHG or antibacterial soaps.  If your skin becomes reddened/irritated stop using the CHG and inform your nurse when you arrive at Short Stay.  Do not shave (including legs and underarms) for at least 48 hours prior to the first CHG shower.  You may shave your face.  Please follow these instructions carefully:   1.  Shower with CHG Soap the night before surgery and the morning of Surgery.  2.  If you  choose to wash your hair, wash your hair first as usual with your normal shampoo.  3.  After you shampoo, rinse your hair and body thoroughly to remove the Shampoo.  4.  Use CHG as you would any other liquid soap.  You can apply chg directly  to the skin and wash gently with scrungie or a clean washcloth.  5.  Apply the CHG Soap to your body ONLY FROM THE NECK DOWN.  Do not use on open wounds or open sores.  Avoid contact with your eyes ears, mouth and genitals (private parts).  Wash genitals (private parts)       with your normal soap.  6.  Wash thoroughly, paying special attention to the area where your surgery will be performed.  7.  Thoroughly rinse your body with warm water from the neck down.  8.  DO NOT shower/wash with your normal soap after using and rinsing off the CHG Soap.  9.  Pat yourself dry with a clean towel.            10.  Wear clean pajamas.            11.  Place clean sheets on your bed the night of your first shower and do not sleep with pets.  Day of Surgery  Do not apply any lotions/deodorants the morning of surgery.  Please wear clean clothes to the hospital/surgery center.  Please read over the following fact sheets that you were given. Pain Booklet, Coughing and Deep Breathing, Blood Transfusion Information, MRSA Information and Surgical Site Infection Prevention

## 2015-12-12 NOTE — Progress Notes (Signed)
To continue aspirin and plavix and take dos per pt per dr Myra Gianottibrabham. To pick up cpap tomorrow and start

## 2015-12-13 ENCOUNTER — Encounter (HOSPITAL_COMMUNITY): Payer: Self-pay

## 2015-12-13 NOTE — Progress Notes (Signed)
Anesthesia Chart Review: Patient is a 70 year old male scheduled for right CEA on 12/15/15 by Dr. Myra GianottiBrabham. He has symptomatic right carotid stenosis.  History includes HTN, HLD, psoriasis, murmur with moderate AS (12/01/15 echo), OSA (picking up CPAP 12/13/15), GERD, arthritis, influenza A with PNA 09/2015, CVA 09/2015 (while visiting family in [place]).   - PCP is Dr. Laren BoomSean Hommel. - Neurologist is Dr. Marjory LiesPenumalli. Saw Dr. Frances FurbishAthar for OSA. - He was seen by cardiologist Dr. Elberta Fortisamnitz on 11/30/15 for preoperative cardiology evaluation. Following a recent echo, he felt patient would be okay to proceed with surgery but recommended 6-12 months follow-up regarding moderate AS (he is being referred for general cardiology follow-up with Dr. Clifton JamesMcAlhany).   Meds include ASA, Lipitor, Plavix, Fiber, HCTZ. VVS has instructed him to continue ASA and Plavix perioperatively.   PAT Vitals: BP 145/74, HR 81, RR 18, T 36.4C, O2 sat 100%. BMI 24.70.   11/30/15 EKG: NSR, inferior infarct (age undetermined), possible anterolateral infarct (age undetermined).  12/01/15 Echo: Study Conclusions - Left ventricle: The cavity size was normal. Systolic function was  normal. The estimated ejection fraction was in the range of 55%  to 60%. Wall motion was normal; there were no regional wall  motion abnormalities. Doppler parameters are consistent with  abnormal left ventricular relaxation (grade 1 diastolic  dysfunction). - Aortic valve: Valve mobility was restricted. There was moderate  stenosis. Peak velocity (S): 349 cm/s. Mean gradient (S): 28 mm  Hg.  09/27/15 [facility] brain([facility]) - acute right basal ganglia, periventricular white matter, frontal centrum semiovale, right insular non-hemorrhagic ischemic infarction - mild chronic small vessel ischemic disease   09/29/15 CTA head [facility]) - moderate stenosis of right supraclinoid ICA - severe stenosis of left vertebral artery - mild-moderate  narrowing of right vertebral artery  09/29/15 CTA neck [facility]) - moderate right ICA stenosis --> 50-60% stenosis with calcifications - mild left ICA stenosis - mild-moderate right vertebral artery stenosis - mild left vertebral artery stenosis  09/29/15 carotid u/s [facility]) - 50-69% bilateral ICA stenosis - antegrade flow in vertebral arteries  09/29/15 1V CXR [facility])  - Mild interval increase in patchy LLL infiltrates. - Mild interval increase in focal RUL infiltrates. - Stable RLL infiltrates. - Stable hyperexpansion of the lungs.   Preoperative labs noted. BUN 20, Cr 1.54 (previous labs show Cr 1.0 - 1.4 09/2015; some scanned under Media tab). Glucose 103. CBC, PT/PTT WNL. Needs UA day of surgery. A1c 5.5 on 09/28/15 (scanned under Media tab).   If no acute changes then I anticipate that he can proceed as planned. He will need follow-up of his renal function post-operatively.  Velna Ochsllison Zelenak, PA-C [facility] Short Stay Center/Anesthesiology Phone [phone] 12/13/2015 10:48 AM

## 2015-12-15 ENCOUNTER — Inpatient Hospital Stay (HOSPITAL_COMMUNITY): Payer: Medicare Other | Admitting: Vascular Surgery

## 2015-12-15 ENCOUNTER — Encounter (HOSPITAL_COMMUNITY)
Admission: RE | Disposition: A | Discharge status: Home / Self Care | Payer: Self-pay | Source: Ambulatory Visit | Attending: Surgery

## 2015-12-15 ENCOUNTER — Encounter (HOSPITAL_COMMUNITY): Payer: Self-pay | Admitting: *Deleted

## 2015-12-15 ENCOUNTER — Inpatient Hospital Stay (HOSPITAL_COMMUNITY)
Admission: RE | Admit: 2015-12-15 | Discharge: 2015-12-16 | DRG: 039 | Disposition: A | Discharge status: Home / Self Care | Payer: Medicare Other | Source: Ambulatory Visit | Attending: Surgery | Admitting: Surgery

## 2015-12-15 ENCOUNTER — Inpatient Hospital Stay (HOSPITAL_COMMUNITY): Payer: Medicare Other | Admitting: Anesthesiology

## 2015-12-15 DIAGNOSIS — E876 Hypokalemia: Secondary | ICD-10-CM | POA: Diagnosis present

## 2015-12-15 DIAGNOSIS — Z87891 Personal history of nicotine dependence: Secondary | ICD-10-CM | POA: Diagnosis not present

## 2015-12-15 DIAGNOSIS — I6521 Occlusion and stenosis of right carotid artery: Principal | ICD-10-CM | POA: Diagnosis present

## 2015-12-15 DIAGNOSIS — I10 Essential (primary) hypertension: Secondary | ICD-10-CM | POA: Diagnosis present

## 2015-12-15 DIAGNOSIS — Z7982 Long term (current) use of aspirin: Secondary | ICD-10-CM

## 2015-12-15 DIAGNOSIS — Z8673 Personal history of transient ischemic attack (TIA), and cerebral infarction without residual deficits: Secondary | ICD-10-CM

## 2015-12-15 DIAGNOSIS — E785 Hyperlipidemia, unspecified: Secondary | ICD-10-CM | POA: Diagnosis present

## 2015-12-15 DIAGNOSIS — Z7902 Long term (current) use of antithrombotics/antiplatelets: Secondary | ICD-10-CM | POA: Diagnosis not present

## 2015-12-15 DIAGNOSIS — Z79899 Other long term (current) drug therapy: Secondary | ICD-10-CM | POA: Diagnosis not present

## 2015-12-15 DIAGNOSIS — I6529 Occlusion and stenosis of unspecified carotid artery: Secondary | ICD-10-CM

## 2015-12-15 DIAGNOSIS — Z23 Encounter for immunization: Secondary | ICD-10-CM | POA: Diagnosis not present

## 2015-12-15 DIAGNOSIS — Z9889 Other specified postprocedural states: Secondary | ICD-10-CM | POA: Diagnosis present

## 2015-12-15 HISTORY — DX: Other specified postprocedural states: Z98.890

## 2015-12-15 HISTORY — PX: ENDARTERECTOMY: SHX5162

## 2015-12-15 HISTORY — PX: PATCH ANGIOPLASTY: SHX6230

## 2015-12-15 HISTORY — DX: Occlusion and stenosis of unspecified carotid artery: I65.29

## 2015-12-15 LAB — URINALYSIS, ROUTINE W REFLEX MICROSCOPIC
Bilirubin Urine: NEGATIVE
Glucose, UA: NEGATIVE mg/dL
Hgb urine dipstick: NEGATIVE
Ketones, ur: NEGATIVE mg/dL
Leukocytes, UA: NEGATIVE
Nitrite: NEGATIVE
Protein, ur: NEGATIVE mg/dL
Specific Gravity, Urine: 1.008 (ref 1.005–1.030)
pH: 5.5 (ref 5.0–8.0)

## 2015-12-15 SURGERY — ENDARTERECTOMY, CAROTID
Anesthesia: General | Site: Neck | Laterality: Right

## 2015-12-15 MED ORDER — LACTATED RINGERS IV SOLN
INTRAVENOUS | Status: DC
  Administered 2015-12-15 (×3): via INTRAVENOUS

## 2015-12-15 MED ORDER — SODIUM CHLORIDE 0.9 % IV SOLN: INTRAVENOUS | Status: DC

## 2015-12-15 MED ORDER — LIDOCAINE HCL (CARDIAC) 20 MG/ML IV SOLN
INTRAVENOUS | Status: DC | PRN
  Administered 2015-12-15: 40 mg via INTRAVENOUS

## 2015-12-15 MED ORDER — LIDOCAINE HCL (PF) 1 % IJ SOLN
INTRAMUSCULAR | Status: AC
  Filled 2015-12-15: qty 30

## 2015-12-15 MED ORDER — SUGAMMADEX SODIUM 200 MG/2ML IV SOLN
INTRAVENOUS | Status: AC
  Filled 2015-12-15: qty 2

## 2015-12-15 MED ORDER — PANTOPRAZOLE SODIUM 40 MG PO TBEC
40.0000 mg | DELAYED_RELEASE_TABLET | Freq: Every day | ORAL | Status: DC
  Administered 2015-12-15 – 2015-12-16 (×2): 40 mg via ORAL
  Filled 2015-12-15 (×2): qty 1

## 2015-12-15 MED ORDER — CHLORHEXIDINE GLUCONATE CLOTH 2 % EX PADS: 6.0000 | MEDICATED_PAD | Freq: Once | CUTANEOUS | Status: DC

## 2015-12-15 MED ORDER — ACETAMINOPHEN 325 MG PO TABS: 325.0000 mg | ORAL_TABLET | ORAL | Status: DC | PRN

## 2015-12-15 MED ORDER — POTASSIUM CHLORIDE CRYS ER 20 MEQ PO TBCR
20.0000 meq | EXTENDED_RELEASE_TABLET | Freq: Every day | ORAL | Status: AC | PRN
  Administered 2015-12-16: 40 meq via ORAL
  Filled 2015-12-15: qty 2

## 2015-12-15 MED ORDER — PNEUMOCOCCAL VAC POLYVALENT 25 MCG/0.5ML IJ INJ
0.5000 mL | INJECTION | INTRAMUSCULAR | Status: AC
  Administered 2015-12-16: 0.5 mL via INTRAMUSCULAR
  Filled 2015-12-15: qty 0.5

## 2015-12-15 MED ORDER — PROTAMINE SULFATE 10 MG/ML IV SOLN
INTRAVENOUS | Status: DC | PRN
  Administered 2015-12-15 (×2): 25 mg via INTRAVENOUS
  Administered 2015-12-15: 5 mg via INTRAVENOUS
  Administered 2015-12-15: 20 mg via INTRAVENOUS

## 2015-12-15 MED ORDER — PHENYLEPHRINE HCL 10 MG/ML IJ SOLN
INTRAMUSCULAR | Status: DC | PRN
  Administered 2015-12-15 (×2): 120 ug via INTRAVENOUS
  Administered 2015-12-15: 80 ug via INTRAVENOUS

## 2015-12-15 MED ORDER — FENTANYL CITRATE (PF) 250 MCG/5ML IJ SOLN
INTRAMUSCULAR | Status: AC
  Filled 2015-12-15: qty 5

## 2015-12-15 MED ORDER — OXYCODONE HCL 5 MG PO TABS: 5.0000 mg | ORAL_TABLET | Freq: Once | ORAL | Status: DC | PRN

## 2015-12-15 MED ORDER — OXYCODONE-ACETAMINOPHEN 5-325 MG PO TABS: 1.0000 | ORAL_TABLET | ORAL | Status: DC | PRN

## 2015-12-15 MED ORDER — DEXTROSE 5 % IV SOLN
1.5000 g | Freq: Two times a day (BID) | INTRAVENOUS | Status: AC
  Administered 2015-12-15 – 2015-12-16 (×2): 1.5 g via INTRAVENOUS
  Filled 2015-12-15 (×3): qty 1.5

## 2015-12-15 MED ORDER — SODIUM CHLORIDE 0.9 % IV SOLN
INTRAVENOUS | Status: DC | PRN
  Administered 2015-12-15: 10:00:00

## 2015-12-15 MED ORDER — SODIUM CHLORIDE 0.9 % IV SOLN
INTRAVENOUS | Status: DC
  Administered 2015-12-15: 19:00:00 via INTRAVENOUS

## 2015-12-15 MED ORDER — LABETALOL HCL 5 MG/ML IV SOLN: 10.0000 mg | INTRAVENOUS | Status: DC | PRN

## 2015-12-15 MED ORDER — OXYCODONE HCL 5 MG/5ML PO SOLN: 5.0000 mg | Freq: Once | ORAL | Status: DC | PRN

## 2015-12-15 MED ORDER — 0.9 % SODIUM CHLORIDE (POUR BTL) OPTIME
TOPICAL | Status: DC | PRN
Start: 2015-12-15 — End: 2015-12-15
  Administered 2015-12-15: 2000 mL
  Administered 2015-12-15: 1000 mL

## 2015-12-15 MED ORDER — ACETAMINOPHEN 325 MG PO TABS
325.0000 mg | ORAL_TABLET | ORAL | Status: DC | PRN
  Administered 2015-12-16 (×2): 650 mg via ORAL
  Filled 2015-12-15 (×2): qty 2

## 2015-12-15 MED ORDER — ONDANSETRON HCL 4 MG/2ML IJ SOLN
4.0000 mg | Freq: Four times a day (QID) | INTRAMUSCULAR | Status: DC | PRN
Start: 2015-12-15 — End: 2015-12-16

## 2015-12-15 MED ORDER — PHENOL 1.4 % MT LIQD: 1.0000 | OROMUCOSAL | Status: DC | PRN

## 2015-12-15 MED ORDER — ACETAMINOPHEN 325 MG RE SUPP: 325.0000 mg | RECTAL | Status: DC | PRN

## 2015-12-15 MED ORDER — ALBUMIN HUMAN 5 % IV SOLN
12.5000 g | Freq: Once | INTRAVENOUS | Status: AC
  Administered 2015-12-15: 12.5 g via INTRAVENOUS

## 2015-12-15 MED ORDER — HEMOSTATIC AGENTS (NO CHARGE) OPTIME
TOPICAL | Status: DC | PRN
  Administered 2015-12-15 (×2): 1 via TOPICAL

## 2015-12-15 MED ORDER — METOPROLOL TARTRATE 5 MG/5ML IV SOLN: 2.0000 mg | INTRAVENOUS | Status: DC | PRN

## 2015-12-15 MED ORDER — HYDROCORTISONE 1 % EX OINT
1.0000 | TOPICAL_OINTMENT | Freq: Two times a day (BID) | CUTANEOUS | Status: DC
Start: 2015-12-15 — End: 2015-12-16
  Filled 2015-12-15: qty 28.35

## 2015-12-15 MED ORDER — PROPOFOL 10 MG/ML IV BOLUS
INTRAVENOUS | Status: AC
  Filled 2015-12-15: qty 20

## 2015-12-15 MED ORDER — PHENYLEPHRINE HCL 10 MG/ML IJ SOLN
10.0000 mg | INTRAVENOUS | Status: DC | PRN
  Administered 2015-12-15: 10 ug/min via INTRAVENOUS

## 2015-12-15 MED ORDER — CEFUROXIME SODIUM 1.5 G IJ SOLR
1.5000 g | INTRAMUSCULAR | Status: AC
  Administered 2015-12-15: 1.5 g via INTRAVENOUS
  Filled 2015-12-15: qty 1.5

## 2015-12-15 MED ORDER — FENTANYL CITRATE (PF) 100 MCG/2ML IJ SOLN
INTRAMUSCULAR | Status: DC | PRN
  Administered 2015-12-15: 100 ug via INTRAVENOUS

## 2015-12-15 MED ORDER — ALUM & MAG HYDROXIDE-SIMETH 200-200-20 MG/5ML PO SUSP: 15.0000 mL | ORAL | Status: DC | PRN

## 2015-12-15 MED ORDER — ONDANSETRON HCL 4 MG/2ML IJ SOLN
INTRAMUSCULAR | Status: DC | PRN
  Administered 2015-12-15: 4 mg via INTRAVENOUS

## 2015-12-15 MED ORDER — LIDOCAINE HCL 4 % EX SOLN
CUTANEOUS | Status: DC | PRN
  Administered 2015-12-15: 4 mL via TOPICAL

## 2015-12-15 MED ORDER — ASPIRIN EC 81 MG PO TBEC
81.0000 mg | DELAYED_RELEASE_TABLET | Freq: Every day | ORAL | Status: DC
  Administered 2015-12-15 – 2015-12-16 (×2): 81 mg via ORAL
  Filled 2015-12-15 (×3): qty 1

## 2015-12-15 MED ORDER — ROCURONIUM BROMIDE 50 MG/5ML IV SOLN
INTRAVENOUS | Status: AC
  Filled 2015-12-15: qty 1

## 2015-12-15 MED ORDER — SUGAMMADEX SODIUM 200 MG/2ML IV SOLN
INTRAVENOUS | Status: DC | PRN
  Administered 2015-12-15: 150 mg via INTRAVENOUS

## 2015-12-15 MED ORDER — ALBUMIN HUMAN 5 % IV SOLN
INTRAVENOUS | Status: AC
  Administered 2015-12-15: 12.5 g via INTRAVENOUS
  Filled 2015-12-15: qty 250

## 2015-12-15 MED ORDER — DOCUSATE SODIUM 100 MG PO CAPS
100.0000 mg | ORAL_CAPSULE | Freq: Every day | ORAL | Status: DC
  Administered 2015-12-16: 100 mg via ORAL
  Filled 2015-12-15: qty 1

## 2015-12-15 MED ORDER — ATORVASTATIN CALCIUM 10 MG PO TABS
10.0000 mg | ORAL_TABLET | Freq: Every day | ORAL | Status: DC
  Administered 2015-12-15 – 2015-12-16 (×2): 10 mg via ORAL
  Filled 2015-12-15 (×2): qty 1

## 2015-12-15 MED ORDER — ROCURONIUM BROMIDE 100 MG/10ML IV SOLN
INTRAVENOUS | Status: DC | PRN
  Administered 2015-12-15: 50 mg via INTRAVENOUS

## 2015-12-15 MED ORDER — SODIUM CHLORIDE 0.9 % IV SOLN
0.0125 ug/kg/min | INTRAVENOUS | Status: AC
  Administered 2015-12-15: .3 ug/kg/min via INTRAVENOUS
  Filled 2015-12-15: qty 2000

## 2015-12-15 MED ORDER — FENTANYL CITRATE (PF) 100 MCG/2ML IJ SOLN: 25.0000 ug | INTRAMUSCULAR | Status: DC | PRN

## 2015-12-15 MED ORDER — GUAIFENESIN-DM 100-10 MG/5ML PO SYRP: 15.0000 mL | ORAL_SOLUTION | ORAL | Status: DC | PRN

## 2015-12-15 MED ORDER — HEPARIN SODIUM (PORCINE) 1000 UNIT/ML IJ SOLN
INTRAMUSCULAR | Status: DC | PRN
  Administered 2015-12-15: 7000 [IU] via INTRAVENOUS

## 2015-12-15 MED ORDER — HYDRALAZINE HCL 20 MG/ML IJ SOLN: 5.0000 mg | INTRAMUSCULAR | Status: DC | PRN

## 2015-12-15 MED ORDER — CLOPIDOGREL BISULFATE 75 MG PO TABS
75.0000 mg | ORAL_TABLET | Freq: Every day | ORAL | Status: DC
  Administered 2015-12-16: 75 mg via ORAL
  Filled 2015-12-15: qty 1

## 2015-12-15 MED ORDER — MORPHINE SULFATE (PF) 2 MG/ML IV SOLN: 2.0000 mg | INTRAVENOUS | Status: DC | PRN

## 2015-12-15 MED ORDER — OXYCODONE-ACETAMINOPHEN 5-325 MG PO TABS: 1.0000 | ORAL_TABLET | Freq: Four times a day (QID) | ORAL | Status: DC | PRN

## 2015-12-15 MED ORDER — ONDANSETRON HCL 4 MG/2ML IJ SOLN
INTRAMUSCULAR | Status: AC
  Filled 2015-12-15: qty 2

## 2015-12-15 MED ORDER — HYDROCHLOROTHIAZIDE 25 MG PO TABS
25.0000 mg | ORAL_TABLET | Freq: Every day | ORAL | Status: DC
  Administered 2015-12-15 – 2015-12-16 (×2): 25 mg via ORAL
  Filled 2015-12-15 (×2): qty 1

## 2015-12-15 MED ORDER — ACETAMINOPHEN 160 MG/5ML PO SOLN
325.0000 mg | ORAL | Status: DC | PRN
Start: 2015-12-15 — End: 2015-12-15
  Filled 2015-12-15: qty 20.3

## 2015-12-15 MED ORDER — MAGNESIUM SULFATE 2 GM/50ML IV SOLN: 2.0000 g | Freq: Every day | INTRAVENOUS | Status: DC | PRN

## 2015-12-15 MED ORDER — EPHEDRINE SULFATE 50 MG/ML IJ SOLN
INTRAMUSCULAR | Status: DC | PRN
  Administered 2015-12-15: 10 mg via INTRAVENOUS
  Administered 2015-12-15: 5 mg via INTRAVENOUS
  Administered 2015-12-15: 10 mg via INTRAVENOUS
  Administered 2015-12-15: 5 mg via INTRAVENOUS

## 2015-12-15 MED ORDER — PROPOFOL 10 MG/ML IV BOLUS
INTRAVENOUS | Status: DC | PRN
  Administered 2015-12-15 (×2): 20 mg via INTRAVENOUS
  Administered 2015-12-15: 30 mg via INTRAVENOUS

## 2015-12-15 MED ORDER — SODIUM CHLORIDE 0.9 % IV SOLN: 500.0000 mL | Freq: Once | INTRAVENOUS | Status: DC | PRN

## 2015-12-15 SURGICAL SUPPLY — 47 items
CANISTER SUCTION 2500CC (MISCELLANEOUS) ×2 IMPLANT
CATH ROBINSON RED A/P 18FR (CATHETERS) ×2 IMPLANT
CATH SUCT 10FR WHISTLE TIP (CATHETERS) ×2 IMPLANT
CLIP TI MEDIUM 6 (CLIP) ×2 IMPLANT
CLIP TI WIDE RED SMALL 6 (CLIP) ×2 IMPLANT
CRADLE DONUT ADULT HEAD (MISCELLANEOUS) ×2 IMPLANT
DRAIN CHANNEL 15F RND FF W/TCR (WOUND CARE) ×2 IMPLANT
ELECT REM PT RETURN 9FT ADLT (ELECTROSURGICAL) ×2
ELECTRODE REM PT RTRN 9FT ADLT (ELECTROSURGICAL) ×1 IMPLANT
EVACUATOR SILICONE 100CC (DRAIN) IMPLANT
GAUZE SPONGE 4X4 12PLY STRL (GAUZE/BANDAGES/DRESSINGS) ×2 IMPLANT
GLOVE BIO SURGEON STRL SZ 6.5 (GLOVE) ×4 IMPLANT
GLOVE BIOGEL PI IND STRL 6.5 (GLOVE) ×2 IMPLANT
GLOVE BIOGEL PI IND STRL 7.5 (GLOVE) ×1 IMPLANT
GLOVE BIOGEL PI INDICATOR 6.5 (GLOVE) ×2
GLOVE BIOGEL PI INDICATOR 7.5 (GLOVE) ×1
GLOVE SURG SS PI 7.5 STRL IVOR (GLOVE) ×2 IMPLANT
GOWN STRL REUS W/ TWL LRG LVL3 (GOWN DISPOSABLE) ×2 IMPLANT
GOWN STRL REUS W/ TWL XL LVL3 (GOWN DISPOSABLE) ×1 IMPLANT
GOWN STRL REUS W/TWL LRG LVL3 (GOWN DISPOSABLE) ×2
GOWN STRL REUS W/TWL XL LVL3 (GOWN DISPOSABLE) ×1
HEMOSTAT SNOW SURGICEL 2X4 (HEMOSTASIS) ×4 IMPLANT
INSERT FOGARTY SM (MISCELLANEOUS) IMPLANT
KIT BASIN OR (CUSTOM PROCEDURE TRAY) ×2 IMPLANT
KIT ROOM TURNOVER OR (KITS) ×2 IMPLANT
LIQUID BAND (GAUZE/BANDAGES/DRESSINGS) ×2 IMPLANT
NEEDLE HYPO 25GX1X1/2 BEV (NEEDLE) IMPLANT
NS IRRIG 1000ML POUR BTL (IV SOLUTION) ×6 IMPLANT
PACK CAROTID (CUSTOM PROCEDURE TRAY) ×2 IMPLANT
PAD ARMBOARD 7.5X6 YLW CONV (MISCELLANEOUS) ×4 IMPLANT
PATCH VASC XENOSURE 1CMX6CM (Vascular Products) ×1 IMPLANT
PATCH VASC XENOSURE 1X6 (Vascular Products) ×1 IMPLANT
SHUNT CAROTID BYPASS 10 (VASCULAR PRODUCTS) IMPLANT
SHUNT CAROTID BYPASS 12FRX15.5 (VASCULAR PRODUCTS) IMPLANT
SPONGE INTESTINAL PEANUT (DISPOSABLE) ×2 IMPLANT
SUT ETHILON 3 0 PS 1 (SUTURE) IMPLANT
SUT PROLENE 6 0 BV (SUTURE) ×10 IMPLANT
SUT PROLENE 7 0 BV 1 (SUTURE) IMPLANT
SUT PROLENE 7 0 BV1 MDA (SUTURE) ×2 IMPLANT
SUT SILK 2 0 SH (SUTURE) ×2 IMPLANT
SUT SILK 3 0 TIES 17X18 (SUTURE)
SUT SILK 3-0 18XBRD TIE BLK (SUTURE) IMPLANT
SUT VIC AB 3-0 SH 27 (SUTURE) ×2
SUT VIC AB 3-0 SH 27X BRD (SUTURE) ×2 IMPLANT
SUT VICRYL 4-0 PS2 18IN ABS (SUTURE) ×2 IMPLANT
SYR CONTROL 10ML LL (SYRINGE) IMPLANT
WATER STERILE IRR 1000ML POUR (IV SOLUTION) ×2 IMPLANT

## 2015-12-15 NOTE — Interval H&P Note (Signed)
History and Physical Interval Note:  12/15/2015 7:38 AM  Bertha StakesPaul M Ebert  has presented today for surgery, with the diagnosis of Right carotid artery stenosis I65.21  The various methods of treatment have been discussed with the patient and family. After consideration of risks, benefits and other options for treatment, the patient has consented to  Procedure(s): ENDARTERECTOMY CAROTID (Right) as a surgical intervention .  The patient's history has been reviewed, patient examined, no change in status, stable for surgery.  I have reviewed the patient's chart and labs.  Questions were answered to the patient's satisfaction.     Durene CalBrabham, [place]

## 2015-12-15 NOTE — Anesthesia Procedure Notes (Addendum)
Procedure Name: Intubation Date/Time: 12/15/2015 9:36 AM Performed by: Trixie Deis A Pre-anesthesia Checklist: Patient identified, Timeout performed, Emergency Drugs available, Suction available and Patient being monitored Patient Re-evaluated:Patient Re-evaluated prior to inductionOxygen Delivery Method: Circle system utilized Preoxygenation: Pre-oxygenation with 100% oxygen Intubation Type: IV induction Ventilation: Mask ventilation without difficulty and Oral airway inserted - appropriate to patient size Laryngoscope Size: Mac and 4 Grade View: Grade I Tube type: Oral Tube size: 7.5 mm Number of attempts: 1 Airway Equipment and Method: Stylet and LTA kit utilized Placement Confirmation: ETT inserted through vocal cords under direct vision,  breath sounds checked- equal and bilateral and positive ETCO2 Secured at: 23 cm Tube secured with: Tape

## 2015-12-15 NOTE — H&P (View-Only) (Signed)
[facility]ist of [place]  Patient name: Leonard Padilla Messineo MRN: [id number] DOB: [birth year] Sex: male  REASON FOR CONSULT: Carotid Stenosis Referring Physician:   Dr. Danae OrleansPenumali  HPI: Leonard Padilla Hepp is a 70 y.o. male, who is referred for management of carotid stenosis in the setting of a stroke.  In February 2017, while visiting family in [place], the patient developed a cough and weakness.  He was diagnosed with influenza a and pneumonia.  Also at that time he began having symptoms of left-sided weakness and slurred speech and facial drooping.  Imaging studies revealed an acute right brain subcortical ischemic infarct.  He was also found to have intracranial and extracranial stenosis.  Several years ago, the patient stopped taking his medications, after this event he was started back on a statin for hypercholesterolemia medications for his hypertension as well as an aspirin and Plavix.  His symptoms have nearly resolved.  He no longer has left arm or leg symptoms.  He will occasionally have some difficulty with cognitive issues but these are getting better.  The patient has a history of smoking this was many years ago.    Past Medical History  Diagnosis Date  . Psoriasis   . Essential hypertension 10/13/2015  . Hyperlipidemia   . Stroke (HCC) 09/2015  . Flu 09/2015    influenza A  . Pneumonia 09/2015    Family History  Problem Relation Age of Onset  . Cancer Mother     ovarian cancer  . Cancer Father     Prostate    SOCIAL HISTORY: Social History   Social History  . Marital Status: Married    Spouse Name: [name]  . Number of Children: 2  . Years of Education: 13   Occupational History  . 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 Smoking status: Former Smoker    Quit date: 08/07/1983  . Smokeless tobacco: Never Used  . Alcohol Use: Yes     Comment: 2 glasses wine daily  . Drug Use: No  . Sexual Activity: Not Currently   Other  Topics Concern  . Not on file   Social History Narrative   Lives with wife   Caffeine use- coffee 3-4 cups daily    No Known Allergies  Current Outpatient Prescriptions  Medication Sig Dispense Refill  . aspirin 81 MG tablet Take 81 mg by mouth daily.    Marland Kitchen. atorvastatin (LIPITOR) 10 MG tablet Take 1 tablet (10 mg total) by mouth daily. 90 tablet 0  . clopidogrel (PLAVIX) 75 MG tablet Take 1 tablet (75 mg total) by mouth daily. 30 tablet 11  . halobetasol (ULTRAVATE) 0.05 % ointment Apply topically 2 (two) times daily.    . hydrochlorothiazide (HYDRODIURIL) 25 MG tablet Take 1 tablet (25 mg total) by mouth daily. 90 tablet 0  . omeprazole (PRILOSEC) 20 MG capsule Take 1 capsule (20 mg total) by mouth daily. 30 capsule 0  . psyllium (REGULOID) 0.52 g capsule Take 0.52 g by mouth daily.     No current facility-administered medications for this visit.    REVIEW OF SYSTEMS:  [X]  denotes positive finding, [ ]  denotes negative finding Cardiac  Comments:  Chest pain or chest pressure:    Shortness of breath upon exertion:    Short of breath when lying flat:    Irregular heart rhythm:        Vascular    Pain in calf, thigh, or  hip brought on by ambulation:    Pain in feet at night that wakes you up from your sleep:     Blood clot in your veins:    Leg swelling:         Pulmonary    Oxygen at home:    Productive cough:     Wheezing:         Neurologic    Sudden weakness in arms or legs:     Sudden numbness in arms or legs:     Sudden onset of difficulty speaking or slurred speech:    Temporary loss of vision in one eye:     Problems with dizziness:         Gastrointestinal    Blood in stool:     Vomited blood:         Genitourinary    Burning when urinating:     Blood in urine:        Psychiatric    Major depression:         Hematologic    Bleeding problems:    Problems with blood clotting too easily:        Skin    Rashes or ulcers: Psoriasis         Constitutional    Fever or chills:      PHYSICAL EXAM: Filed Vitals:   11/22/15 0959 11/22/15 1002  BP: 137/82 136/86  Pulse: 82 82  Temp: 97.4 F (36.3 C)   TempSrc: Oral   Resp: 16   Height:  (1.753 m)   Weight: 171 lb (77.565 kg)   SpO2: 99%     GENERAL: The patient is a well-nourished male, in no acute distress. The vital signs are documented above. CARDIAC: There is a regular rate and rhythm.  VASCULAR: Systolic ejection murmur which radiates into both carotid arteries  PULMONARY: There is good air exchange bilaterally without wheezing or rales. ABDOMEN: Soft and non-tender with normal pitched bowel sounds.  MUSCULOSKELETAL: There are no major deformities or cyanosis. NEUROLOGIC: No focal weakness or paresthesias are detected. SKIN: There are no ulcers or rashes noted. PSYCHIATRIC: The patient has a normal affect.  DATA:  Have personally reviewed the images for the CT scan.  The carotid bifurcation is in the mid neck.  This approximates 60-70% right carotid stenosis.    MEDICAL ISSUES: Symptomatic carotid stenosis: I discussed with the patient proceeding with right carotid endarterectomy for stroke prevention.  In addition, we will continue with maximal medical therapy, which he is on.  We discussed the risks and benefits surgery including the risk of incisional numbness, stroke, nerve injury, cardiopulmonary couple cases, and bleeding.  I will not stop his Plavix.  I'm going to send the patient for cardiac clearance.  He wants to continue to think about whether or not he wishes to proceed with surgery.  He understands the pros and cons of medical management versus intervention.  He will contact me after his cardiology visit.   Durene Cal [facility] [employer]: [phone]

## 2015-12-15 NOTE — Transfer of Care (Signed)
Immediate Anesthesia Transfer of Care Note  Patient: Leonard Padilla  Procedure(s) Performed: Procedure(s): RIGHT CAROTID ENDARTERECTOMY WITH PATCH ANGIOPLASTY (Right) RIGHT CAROTID PATCH ANGIOPLASTY (Right)  Patient Location: PACU  Anesthesia Type:General  Level of Consciousness: awake, alert  and oriented  Airway & Oxygen Therapy: Patient Spontanous Breathing and Patient connected to nasal cannula oxygen  Post-op Assessment: Report given to RN, Post -op Vital signs reviewed and stable, Patient moving all extremities and L sided drooping when patient smiles. Pt reports this as baseline even though he denied residual weakness preop.   Post vital signs: Reviewed and stable  Last Vitals:  Filed Vitals:   12/15/15 0800 12/15/15 1152  BP: 161/73   Pulse: 70   Temp: 37.1 C 36.4 C  Resp: 18     Last Pain: There were no vitals filed for this visit.    Patients Stated Pain Goal: 3 (12/15/15 0804)  Complications: No apparent anesthesia complications

## 2015-12-15 NOTE — Progress Notes (Addendum)
[facility] [place]  Subjective  - Comfortable.   Objective 121/88 57 97.6 F (36.4 C) (Oral) 16 100%  Intake/Output Summary (Last 24 hours) at 12/15/15 1416 Last data filed at 12/15/15 1112  Gross per 24 hour  Intake   1000 ml  Output    200 ml  Net    800 ml    Right neck incision soft, JP output 50 cc since surgery No tongue deviation and smile is symmetric  Assessment/Planning: S/P right CEA  Pending 3S bed Disposition stable  COLLINS, EMMA MAUREEN 12/15/2015 2:16 PM --  Laboratory Lab Results: No results for input(s): WBC, HGB, HCT, PLT in the last 72 hours. BMET No results for input(s): NA, K, CL, CO2, GLUCOSE, BUN, CREATININE, CALCIUM in the last 72 hours.  COAG Lab Results  Component Value Date   INR 1.02 12/12/2015   No results found for: PTT    S/p R CEA.  Neuro intact.  JP minimal Anticipate d/c tomorrow am  WElls L-3 CommunicationsBRabham

## 2015-12-15 NOTE — Anesthesia Preprocedure Evaluation (Signed)
Anesthesia Evaluation  Patient identified by MRN, date of birth, ID band Patient awake    Reviewed: Allergy & Precautions, NPO status , Patient's Chart, lab work & pertinent test results  History of Anesthesia Complications Negative for: history of anesthetic complications  Airway Mallampati: II  TM Distance: >3 FB Neck ROM: Full    Dental  (+) Teeth Intact,    Pulmonary neg shortness of breath, sleep apnea and Continuous Positive Airway Pressure Ventilation , neg COPD, neg recent URI, former smoker,    breath sounds clear to auscultation       Cardiovascular hypertension, Pt. on medications (-) angina(-) CAD, (-) Past MI and (-) CHF + Valvular Problems/Murmurs AS  Rhythm:Regular     Neuro/Psych neg Seizures CVA, No Residual Symptoms negative psych ROS   GI/Hepatic GERD  Medicated and Controlled,  Endo/Other  negative endocrine ROS  Renal/GU negative Renal ROS     Musculoskeletal  (+) Arthritis ,   Abdominal   Peds  Hematology negative hematology ROS (+)   Anesthesia Other Findings Moderate AS  Reproductive/Obstetrics                             Anesthesia Physical Anesthesia Plan  ASA: III  Anesthesia Plan: General   Post-op Pain Management:    Induction: Intravenous  Airway Management Planned: Oral ETT  Additional Equipment: Arterial line  Intra-op Plan:   Post-operative Plan: Extubation in OR  Informed Consent: I have reviewed the patients History and Physical, chart, labs and discussed the procedure including the risks, benefits and alternatives for the proposed anesthesia with the patient or authorized representative who has indicated his/her understanding and acceptance.   Dental advisory given  Plan Discussed with: CRNA and Surgeon  Anesthesia Plan Comments:         Anesthesia Quick Evaluation

## 2015-12-15 NOTE — Anesthesia Postprocedure Evaluation (Signed)
Anesthesia Post Note  Patient: Leonard Padilla  Procedure(s) Performed: Procedure(s) (LRB): RIGHT CAROTID ENDARTERECTOMY WITH PATCH ANGIOPLASTY (Right) RIGHT CAROTID PATCH ANGIOPLASTY (Right)  Patient location during evaluation: PACU Anesthesia Type: General Level of consciousness: awake Pain management: pain level controlled Vital Signs Assessment: post-procedure vital signs reviewed and stable Respiratory status: spontaneous breathing Cardiovascular status: stable Postop Assessment: no signs of nausea or vomiting Anesthetic complications: no    Last Vitals:  Filed Vitals:   12/15/15 1430 12/15/15 1435  BP: 103/60 109/66  Pulse: 53 61  Temp:    Resp: 17 20    Last Pain: There were no vitals filed for this visit.               Tyasia Packard

## 2015-12-15 NOTE — Op Note (Addendum)
Patient name: Leonard Padilla M Ashton MRN: [id number] DOB: 07/27/1946 Sex: male  12/15/2015 Pre-operative Diagnosis: symptomatic   right carotid stenosis Post-operative diagnosis:  Same Surgeon:  Durene CalBrabham, Wells Assistants:  Karsten RoKim Trinh Procedure:    right carotid Endarterectomy with bovine pericardial patch angioplasty Anesthesia:  General Blood Loss:  See anesthesia record Specimens:  Carotid Plaque to pathology  Findings:  70 %stenosis; Thrombus:  none  Indications:  Leonard Padilla is a 70 y.o. male, who is referred for management of carotid stenosis in the setting of a stroke. In February 2017, while visiting family in [place], the patient developed a cough and weakness. He was diagnosed with influenza a and pneumonia. Also at that time he began having symptoms of left-sided weakness and slurred speech and facial drooping. Imaging studies revealed an acute right brain subcortical ischemic infarct. He was also found to have intracranial and extracranial stenosis.  Procedure:  The patient was identified in the holding area and [facility]  The patient was then placed supine on the table.   General endotrachial anesthesia was administered.  The patient was prepped and draped in the usual sterile fashion.  A time out was called and antibiotics were administered.  The incision was made along the anterior border of the right sternocleidomastoid muscle.  Cautery was used to dissect through the subcutaneous tissue.  The platysma muscle was divided with cautery.  The internal jugular vein was exposed along its anterior medial border.  The common facial vein was exposed and then divided between 2-0 silk ties and metal clips.  The common carotid artery was then circumferentially exposed and encircled with an umbilical tape.  The vagus nerve was identified and protected.  Next sharp dissection was used to expose the external carotid artery and the superior thyroid artery.  The were encircled with a blue  vessel loop and a 2-0 silk tie respectively.  Finally, the internal carotid was carefully dissected free.  An umbilical tape was placed around the internal carotid artery distal to the diseased segment.  The hypoglossal nerve was visualized throughout and protected.  The patient was given systemic heparinization.  A bovine carotid patch was selected and prepared on the back table.  A 10 french shunt was also prepared.  After blood pressure readings were appropriate and the heparin had been given time to circulate, the internal carotid artery was occluded with a baby Gregory clamp.  The external and common carotid arteries were then occluded with vascular clamps and the 2-0 tie tightened on the superior thyroid artery.  A #11 blade was used to make an arteriotomy in the common carotid artery.  This was extended with Potts scissors along the anterior and lateral border of the common and internal carotid artery.  Approximately 70% stenosis was identified.  There was no thrombus identified.  The 10 french shunt was then placed.  A kleiner kuntz elevator was used to perform endarterectomy.  An eversion endarterectomy was performed in the external carotid artery.  A good distal endpoint was obtained in the internal carotid artery.  The specimen was removed and sent to pathology.  Heparinized saline was used to irrigate the endarterectomized field.  All potential embolic debris was removed.  Bovine pericardial patch angioplasty was then performed using a running 6-0 Prolene. Just prior to completion of the repair, the shunt was removed. The common internal and external carotid arteries were all appropriately flushed. The artery was again irrigated with heparin saline.  The anastomosis  was then secured. The clamp was first released on the external carotid artery followed by the common carotid artery approximately 30 seconds later, bloodflow was reestablish through the internal carotid artery.  Next, a hand-held  Doppler  was used to evaluate the signals in the common, external, and internal  carotid arteries, all of which had appropriate signals. I then administered  50 mg protamine. The wound was then irrigated.  After hemostasis was achieved, the carotid sheath was reapproximated with 3-0 Vicryl. The  platysma muscle was reapproximated with running 3-0 Vicryl. The skin  was closed with 4-0 Vicryl. Dermabond was placed on the skin. The  patient was then successfully extubated. His neurologic exam was  similar to his preprocedural exam. The patient was then taken to recovery room  in stable condition. There were no complications.     Disposition:  To PACU in stable condition.  Relevant Operative Details:  Normal bifurcation.  Decreased ICA backbleeding.  Minimal time without shunt. Hypoglossal and vagus nerves visualized and protected.  Small ulcer at bifurcation  V. Durene Cal, M.D. [facility] Office: [phone] Pager:  [phone]

## 2015-12-15 NOTE — Progress Notes (Signed)
PCR positive for staph, has had 6 doses of mupirocin. Last dose was this morning. Pt brought from home, placed on chart.

## 2015-12-16 ENCOUNTER — Encounter (HOSPITAL_COMMUNITY): Payer: Self-pay | Admitting: Surgery

## 2015-12-16 ENCOUNTER — Telehealth: Payer: Self-pay | Admitting: Surgery

## 2015-12-16 DIAGNOSIS — I6521 Occlusion and stenosis of right carotid artery: Secondary | ICD-10-CM | POA: Diagnosis not present

## 2015-12-16 LAB — CBC
HCT: 32.3 % — ABNORMAL LOW (ref 39.0–52.0)
Hemoglobin: 10.3 g/dL — ABNORMAL LOW (ref 13.0–17.0)
MCH: 29.1 pg (ref 26.0–34.0)
MCHC: 31.9 g/dL (ref 30.0–36.0)
MCV: 91.2 fL (ref 78.0–100.0)
Platelets: 168 10*3/uL (ref 150–400)
RBC: 3.54 MIL/uL — ABNORMAL LOW (ref 4.22–5.81)
RDW: 13 % (ref 11.5–15.5)
WBC: 8 10*3/uL (ref 4.0–10.5)

## 2015-12-16 LAB — BASIC METABOLIC PANEL
Anion gap: 11 (ref 5–15)
BUN: 17 mg/dL (ref 6–20)
CO2: 28 mmol/L (ref 22–32)
Calcium: 9.2 mg/dL (ref 8.9–10.3)
Chloride: 100 mmol/L — ABNORMAL LOW (ref 101–111)
Creatinine, Ser: 1.43 mg/dL — ABNORMAL HIGH (ref 0.61–1.24)
GFR calc Af Amer: 55 mL/min — ABNORMAL LOW (ref >60)
GFR calc non Af Amer: 48 mL/min — ABNORMAL LOW (ref >60)
Glucose, Bld: 105 mg/dL — ABNORMAL HIGH (ref 65–99)
Potassium: 3.3 mmol/L — ABNORMAL LOW (ref 3.5–5.1)
Sodium: 139 mmol/L (ref 135–145)

## 2015-12-16 NOTE — Progress Notes (Addendum)
[facility]  Subjective  - Doing well, has voided and tolerating PO's.   Objective 129/70 67 98.6 F (37 C) (Oral) 19 99%  Intake/Output Summary (Last 24 hours) at 12/16/15 0728 Last data filed at 12/16/15 0719  Gross per 24 hour  Intake 1944.34 ml  Output   1560 ml  Net 384.34 ml    Drain Out put 60 cc since surgery Right neck incision clean and dry Grip 5/5 bilaterally, no tongue deviation, smile is symmetric Lung non labored breathing Heart RRR  Assessment/Planning: POD # 1 right CEA  Pending tolerating breakfast and ambulating he will be discharged home later today. D/V JP drain Hypokalemia replenished K+ 40 meq given PO Disposition stable F/U in 2 weeks with Dr. Diamond NickelBrabham  COLLINS, EMMA [facility] 12/16/2015 7:28 AM --  Laboratory Lab Results:  Recent Labs  12/16/15 0419  WBC 8.0  HGB 10.3*  HCT 32.3*  PLT 168   BMET  Recent Labs  12/16/15 0419  NA 139  K 3.3*  CL 100*  CO2 28  GLUCOSE 105*  BUN 17  CREATININE 1.43*  CALCIUM 9.2    COAG Lab Results  Component Value Date   INR 1.02 12/12/2015   No results found for: PTT

## 2015-12-16 NOTE — Progress Notes (Signed)
Patient discharge instructions reviewed with patient and wife TurkeyVictoria who is at bedside. S/s of infection reviewed and both verbalize understanding of instructions. Prescription given to patient. Appointment information reviewed. PIV removed.  VSS. Pt in no acute distress. Patient discharged via wheelchair.

## 2015-12-16 NOTE — Telephone Encounter (Signed)
Sched appt 5/22 ato 9:45. Spoke to pt's wife to inform them of appt.

## 2015-12-16 NOTE — Telephone Encounter (Signed)
-----   Message from Phillips Odorarol S Pullins, RN sent at 12/15/2015  1:13 PM EDT ----- Regarding: needs 2 wk. f/u with [facility]   ----- Message -----    From: Raymond GurneyKimberly A Trinh, PA-C    Sent: 12/15/2015  11:31 AM      To: [facility] Charge Pool  S/p right CEA 12/15/15  F/u with Dr. Myra GianottiBrabham in 2 weeks  Thanks Selena BattenKim

## 2015-12-20 ENCOUNTER — Ambulatory Visit: Payer: Medicare Other | Admitting: Diagnostic Neuroimaging

## 2015-12-20 ENCOUNTER — Encounter: Payer: Self-pay | Admitting: Surgery

## 2015-12-20 NOTE — Discharge Summary (Signed)
[facility] Discharge Summary   Patient ID:  Leonard Padilla MRN: [id number] DOB/AGE: [birth year] 70 y.o.  Admit date: 12/15/2015 Discharge date: 12/16/2015 Date of Surgery: 12/15/2015 Surgeon: Surgeon(s): Nada LibmanVance W Brabham, MD  Admission Diagnosis: Right carotid artery stenosis I65.21  Discharge Diagnoses:  Right carotid artery stenosis I65.21  Secondary Diagnoses: Past Medical History  Diagnosis Date  . Psoriasis   . Essential hypertension 10/13/2015  . Hyperlipidemia   . Stroke (HCC) 09/2015  . Flu 09/2015    influenza A  . Pneumonia 09/2015  . Heart murmur   . Sleep apnea     to pick cpap tomorrow 12/13/15  . GERD (gastroesophageal reflux disease)     occ  . Arthritis   . Aortic stenosis     moderate AS 12/01/15 echo (Dr. Elberta Fortisamnitz)    Procedure(s): RIGHT CAROTID ENDARTERECTOMY WITH PATCH ANGIOPLASTY RIGHT CAROTID PATCH ANGIOPLASTY  Discharged Condition: good  HPI: Leonard Padilla is a 70 y.o. male, who is referred for management of carotid stenosis in the setting of a stroke. In February 2017, while visiting family in [place], the patient developed a cough and weakness. He was diagnosed with influenza a and pneumonia. Also at that time he began having symptoms of left-sided weakness and slurred speech and facial drooping. Imaging studies revealed an acute right brain subcortical ischemic infarct. He was also found to have intracranial and extracranial stenosis. Several years ago, the patient stopped taking his medications, after this event he was started back on a statin for hypercholesterolemia medications for his hypertension as well as an aspirin and Plavix. His symptoms have nearly resolved. He no longer has left arm or leg symptoms. He will occasionally have some difficulty with cognitive issues but these are getting better.  CT scan reviewed by Dr. Myra GianottiBrabham showsapproximates 60-70% right carotid stenosis.    Hospital Course:  Leonard Padilla is a 70  y.o. male is S/P Right Procedure(s): RIGHT CAROTID ENDARTERECTOMY WITH PATCH ANGIOPLASTY RIGHT CAROTID PATCH ANGIOPLASTY  Drain Out put 60 cc since surgery Right neck incision clean and dry Grip 5/5 bilaterally, no tongue deviation, smile is symmetric Lung non labored breathing Heart RRR  Assessment/Planning: POD # 1 right CEA  Pending tolerating breakfast and ambulating he will be discharged home later today. D/V JP drain Hypokalemia replenished K+ 40 meq given PO Disposition stable F/U in 2 weeks with Dr. Myra GianottiBrabham   Significant Diagnostic Studies: CBC Lab Results  Component Value Date   WBC 8.0 12/16/2015   HGB 10.3* 12/16/2015   HCT 32.3* 12/16/2015   MCV 91.2 12/16/2015   PLT 168 12/16/2015    BMET    Component Value Date/Time   NA 139 12/16/2015 0419   K 3.3* 12/16/2015 0419   CL 100* 12/16/2015 0419   CO2 28 12/16/2015 0419   GLUCOSE 105* 12/16/2015 0419   BUN 17 12/16/2015 0419   CREATININE 1.43* 12/16/2015 0419   CREATININE 1.1 09/30/2015   CALCIUM 9.2 12/16/2015 0419   CALCIUM 8.4 09/30/2015   GFRNONAA 48* 12/16/2015 0419   GFRAA 55* 12/16/2015 0419   COAG Lab Results  Component Value Date   INR 1.02 12/12/2015     Disposition:  Discharge to :Home Discharge Instructions    CAROTID Sugery: Call MD for difficulty swallowing or speaking; weakness in arms or legs that is a new symtom; severe headache.  If you have increased swelling in the neck and/or  are having difficulty breathing, CALL [phone]    Complete by:  As directed      Call MD for:  redness, tenderness, or signs of infection (pain, swelling, bleeding, redness, odor or green/yellow discharge around incision site)    Complete by:  As directed      Call MD for:  severe or increased pain, loss or decreased feeling  in affected limb(s)    Complete by:  As directed      Call MD for:  temperature >100.5    Complete by:  As directed      Discharge patient    Complete by:  As directed   Discharge  pt to home     Discharge wound care:    Complete by:  As directed   Wash wound daily with soap and water and pat dry. Do not apply any creams or ointments on your incisions.     Driving Restrictions    Complete by:  As directed   No driving for 1 week     Increase activity slowly    Complete by:  As directed   Walk with assistance use walker or cane as needed     Lifting restrictions    Complete by:  As directed   No lifting for 2 weeks     Resume previous diet    Complete by:  As directed             Medication List    TAKE these medications        aspirin 81 MG tablet  Take 81 mg by mouth daily.     atorvastatin 10 MG tablet  Commonly known as:  LIPITOR  Take 1 tablet (10 mg total) by mouth daily.     clopidogrel 75 MG tablet  Commonly known as:  PLAVIX  Take 1 tablet (75 mg total) by mouth daily.     FIBER PO  Take 6 capsules by mouth 2 (two) times daily.     hydrochlorothiazide 25 MG tablet  Commonly known as:  HYDRODIURIL  Take 1 tablet (25 mg total) by mouth daily.     hydrocortisone 1 % ointment  Apply 1 application topically 2 (two) times daily.     omeprazole 20 MG capsule  Commonly known as:  PRILOSEC  Take 1 capsule (20 mg total) by mouth daily.     oxyCODONE-acetaminophen 5-325 MG tablet  Commonly known as:  ROXICET  Take 1 tablet by mouth every 6 (six) hours as needed.       Verbal and written Discharge instructions given to the patient. Wound care per Discharge AVS     Follow-up Information    Follow up with Durene Cal, MD In 2 weeks.   Specialties:  Vascular Surgery, Cardiology   Why:  Our office will call you to arrange an appointment (sent)   Contact information:   [address] [place] [address] [phone]       Signed: Clinton Gallant [facility] 12/20/2015, 1:12 PM  --- For [facility] use --- Instructions: Press F2 to tab through selections.  Delete question if not applicable.   Modified Rankin score at D/C (0-6):  Rankin Score=0  IV medication needed for:  1. Hypertension: No 2. Hypotension: No  Post-op Complications: No  1. Post-op CVA or TIA: No  If yes: Event classification (right eye, left eye, right cortical, left cortical, verterobasilar, other):   If yes: Timing of event (intra-op, <6 hrs post-op, >=6 hrs post-op, unknown):   2. CN injury: No  If yes: CN 0 injuried  3. Myocardial infarction: No  If yes: Dx by (EKG or clinical, Troponin):   4.  CHF: No  5.  Dysrhythmia (new): No  6. Wound infection: No  7. Reperfusion symptoms: No  8. Return to OR: No  If yes: return to OR for (bleeding, neurologic, other CEA incision, other):   Discharge medications: Statin use:  Yes ASA use:  Yes Beta blocker use:  No  for medical reason   ACE-Inhibitor use:  No  for medical reason   P2Y12 Antagonist use: [ ]  None, [x ] Plavix, [ ]  Plasugrel, [ ]  Ticlopinine, [ ]  Ticagrelor, [ ]  Other, [ ]  No for medical reason, [ ]  Non-compliant, [ ]  Not-indicated Anti-coagulant use:  [x ] None, [ ]  Warfarin, [ ]  Rivaroxaban, [ ]  Dabigatran, [ ]  Other, [ ]  No for medical reason, [ ]  Non-compliant, [ ]  Not-indicated

## 2015-12-26 ENCOUNTER — Ambulatory Visit (INDEPENDENT_AMBULATORY_CARE_PROVIDER_SITE_OTHER): Payer: Medicare Other | Admitting: Surgery

## 2015-12-26 ENCOUNTER — Encounter: Payer: Self-pay | Admitting: Surgery

## 2015-12-26 VITALS — BP 150/84 | HR 70 | Temp 97.3°F | Ht 69.0 in | Wt 166.0 lb

## 2015-12-26 DIAGNOSIS — I6521 Occlusion and stenosis of right carotid artery: Secondary | ICD-10-CM

## 2015-12-26 NOTE — Progress Notes (Signed)
   Patient name: Leonard Padilla MRN: [id number] DOB: [birth year] Sex: male  REASON FOR VISIT: Post-op  HPI: Leonard Padilla is a 70 y.o. male who returns for follow-up.  He is status post right carotid endarterectomy with patch angioplasty on 12/15/2015 for symptomatic right carotid stenosis.  Intraoperative findings included a 70% stenosis.  The patient initially began having symptoms in February 2017 while visiting family in [place].  He had slurred speech and facial drooping.  Imaging revealed an acute ischemic infarct as well as intracranial and extracranial stenosis.  He also had left-sided symptoms.  His postoperative course was uncomplicated.  Current Outpatient Prescriptions  Medication Sig Dispense Refill  . aspirin 81 MG tablet Take 81 mg by mouth daily.    Marland Kitchen. atorvastatin (LIPITOR) 10 MG tablet Take 1 tablet (10 mg total) by mouth daily. 90 tablet 0  . clopidogrel (PLAVIX) 75 MG tablet Take 1 tablet (75 mg total) by mouth daily. 30 tablet 11  . FIBER PO Take 6 capsules by mouth 2 (two) times daily.    . hydrochlorothiazide (HYDRODIURIL) 25 MG tablet Take 1 tablet (25 mg total) by mouth daily. 90 tablet 0  . hydrocortisone 1 % ointment Apply 1 application topically 2 (two) times daily.    Marland Kitchen. oxyCODONE-acetaminophen (ROXICET) 5-325 MG tablet Take 1 tablet by mouth every 6 (six) hours as needed. 15 tablet 0  . omeprazole (PRILOSEC) 20 MG capsule Take 1 capsule (20 mg total) by mouth daily. (Patient not taking: Reported on 12/08/2015) 30 capsule 0   No current facility-administered medications for this visit.    REVIEW OF SYSTEMS:  [X]  denotes positive finding, [ ]  denotes negative finding Cardiac  Comments:  Chest pain or chest pressure: \   Shortness of breath upon exertion:    Short of breath when lying flat:    Irregular heart rhythm:    Constitutional    Fever or chills:      PHYSICAL EXAM: Filed Vitals:   12/26/15 0954 12/26/15 1005  BP: 160/77 150/84  Pulse: 70   Temp:  97.3 F (36.3 C)   TempSrc: Oral   Height: 5\' 9"  (1.753 m)   Weight: 166 lb (75.297 kg)   SpO2: 99%     GENERAL: The patient is a well-nourished male, in no acute distress. The vital signs are documented above. CARDIOVASCULAR: There is a regular rate and rhythm. PULMONARY: There is good air exchange bilaterally without wheezing or rales. Right carotid incision is healing nicely.  He is neurologically intact  MEDICAL ISSUES: Patient will continue with maximal medical therapy.  He'll follow up in 8 months with a repeat carotid duplex.  Durene CalWells Brabham. MD [facility] Tel: [phone] Pager: [phone]

## 2016-01-23 ENCOUNTER — Encounter: Payer: Self-pay | Admitting: Neurology

## 2016-01-23 ENCOUNTER — Ambulatory Visit (INDEPENDENT_AMBULATORY_CARE_PROVIDER_SITE_OTHER): Payer: Medicare Other | Admitting: Neurology

## 2016-01-23 VITALS — BP 146/75 | HR 72 | Resp 16 | Ht 69.0 in | Wt 168.0 lb

## 2016-01-23 DIAGNOSIS — G4733 Obstructive sleep apnea (adult) (pediatric): Secondary | ICD-10-CM | POA: Diagnosis not present

## 2016-01-23 DIAGNOSIS — Z9989 Dependence on other enabling machines and devices: Principal | ICD-10-CM

## 2016-01-23 DIAGNOSIS — Z9889 Other specified postprocedural states: Secondary | ICD-10-CM

## 2016-01-23 NOTE — Patient Instructions (Addendum)
Please continue using your CPAP regularly. While your insurance requires that you use CPAP at least 4 hours each night on 70% of the nights, I recommend, that you not skip any nights and use it throughout the night if you can. Getting used to CPAP and staying with the treatment long term does take time and patience and discipline. Untreated obstructive sleep apnea when it is moderate to severe can have an adverse impact on cardiovascular health and raise her risk for heart disease, arrhythmias, hypertension, congestive heart failure, stroke and diabetes. Untreated obstructive sleep apnea causes sleep disruption, nonrestorative sleep, and sleep deprivation. This can have an impact on your day to day functioning and cause daytime sleepiness and impairment of cognitive function, memory loss, mood disturbance, and problems focussing. Using CPAP regularly can improve these symptoms.  Please try for at least another 3 months with the CPAP, I appreciate your willingness to keep going for now.   You can try Melatonin at night for sleep: take 2 to 3 mg, one to 2 hours before your bedtime. You can go up to 5 mg if needed, even 10 mg as needed. It is over the counter and comes in pill form, chewable form and spray, if you prefer.

## 2016-01-23 NOTE — Progress Notes (Signed)
Subjective:    Patient ID: Leonard Padilla is a 70 y.o. male.  HPI     Interim history:   Leonard Padilla is a 70 year old right-handed gentleman with an underlying medical history of hypertension, hyperlipidemia, stroke in February 2017, pneumonia and influenza also in February 2017, psoriasis, and overweight state, who presents for follow-up consultation of his obstructive sleep apnea, after his recent sleep studies. The patient is unaccompanied today. I first met him on 11/01/2015 at the request of Dr. Leta Baptist, at which time the patient reported snoring and excessive daytime somnolence. I invited him back for sleep study. He had a baseline sleep study, followed by a CPAP titration study. I went over his test results with him in detail today. His baseline sleep study from 11/24/2015 showed a sleep efficiency of only 48.1% with a latency to sleep of 29 minutes and wake after sleep onset of 226 minutes with moderate sleep fragmentation noted. He had an increased percentage of stage II sleep, absence of slow-wave sleep and a decreased percentage of REM sleep at 5.1% with a markedly prolonged REM latency. He had no significant PLMS, EKG or EEG changes. He had overall mild snoring, AHI was 14.7 per hour, rising to 35 per hour during supine sleep. Average oxygen saturation was 94%, nadir was 85%. Based on his sleep related complaints and his medical history of invited him back for a CPAP titration study. He had this on 12/01/2015. Sleep efficiency was 66.7%, sleep latency 39 minutes and wake after sleep onset was 103.5 minutes with mild to moderate sleep fragmentation noted. He had an increased percentage of stage II sleep, absence of slow-wave sleep and a mildly decreased percentage of REM sleep with a normal REM latency. He had no significant PLMS, EKG or EEG changes. CPAP was titrated from 5 cm to 10 cm. AHI was 0 per hour on the final pressure with supine REM sleep achieved. Average oxygen saturation was 96%,  nadir was 90%. Based on his test results are prescribed CPAP therapy for home use at a pressure of 10 cm be a nasal pillows.  Today, 01/23/2016: I reviewed his CPAP compliance data from 12/20/2015 through 01/18/2016 which is a total of 30 days during which time he used his machine 29 days with percent used days greater than 4 hours at 77%, indicating adequate compliance with an average usage for all nights of 4 hours and 30 minutes, residual AHI of 4.2 per hour, leak at times high with the 95th percentile at 24.7 L/m, pressure of 10 cm with EPR of 2.  Today, 01/23/2016: He reports that he is struggling with CPAP therapy. He would like if anything to stop using it. He has not noticed much in the way of improvement in his sleep or sleep related symptoms. In fact, he has trouble going to sleep and staying asleep with the CPAP on, most nights he will put it on while awake and watch TV and have trouble going to*sleep. He has never tried an over-the-counter sleep aid as he feels that without CPAP he had no trouble going to sleep or staying asleep. He is somewhat frustrated about the situation. He is now on a full face mask which he preferred over the nasal pillows. In the interim, on 12/15/2015 he underwent right carotid endarterectomy with patch angioplasty under Dr. Trula Slade. He has done well after surgery. He has no other new complaints.  Previously:  11/01/2015: He reports snoring and excessive daytime somnolence. His Epworth sleepiness score is  7 out of 24 today, his fatigue score is 24 out of 63.  I reviewed your office note from 11/01/2015.she has noted occasional pauses in his breathing. He was diagnosed with right basal ganglia acute stroke. He had a MRI brain on 09/27/2015. He had a CT angiography head and neck on 09/29/2015. These showed moderate stenosis of the right supraclinoid ICA, severe stenosis of the left vertebral artery, mild to moderate narrowing of the right vertebral artery, moderate  right ICA stenosis, mild left ICA stenosis, mild to moderate right vertebral artery stenosis, mild left vertebral artery stenosis. Bilateral carotid ultrasound from 09/29/2015 showed 50-69% bilateral ICA stenosis. Transthoracic echocardiogram in February 2017 showed an EF of 65%, mild MR, mild AS.  His bedtime and waketime was variable prior to the stroke. Symptoms started on 09/22/2015 when he started having a cough. On 09/24/2014 he got up at night and fell and did not have the strength to get up. He was taken to urgent care. He was found to have the flu and was also diagnosed with pneumonia. He had some slurring of speech and weakness and was finally also diagnosed with stroke. Of note, he had not been compliant with his blood pressure medication. He was not hydrating well. He was in [place] helping his youngest son fix his new home. He was physically active and also exerting himself but was not always hydrating well. He was not sleeping very well. He was in [place] for about 6 weeks altogether. Since history to [place] he has lost about 30 pounds. Snoring has since then improved. However, he does not sleep very well. In the past, he would not keep a set bedtime and wake time routine. Since his stroke he tries to be in bed between 10 and 11 PM. It may take him up to an hour and half to fall asleep. Wakeup time is around 8:30 AM. He does not have a family history of obstructive sleep apnea, denies restless leg symptoms, or leg twitching at night but has significant nocturia, about 3-4 times each night. He has also reduced his caffeine intake since the stroke and has increased his water intake. He tries to walk regularly and stay active physically. He has been compliant with his medications.  He quit smoking in 1985. He was a heavy smoker, 2-3 packs per day at the time. He drinks 2 glasses of wine per day, he denies using any illicit drugs, he drinks caffeine in the form of coffee, 3-4 cups per day. He  has 2 grandchildren. He lives with his wife. He is retired. He has worked as a Building surveyor other jobs.  His Past Medical History Is Significant For: Past Medical History  Diagnosis Date  . Psoriasis   . Essential hypertension 10/13/2015  . Hyperlipidemia   . Stroke ([place]) 09/2015  . Flu 09/2015    influenza A  . Pneumonia 09/2015  . Heart murmur   . Sleep apnea     to pick cpap tomorrow 12/13/15  . GERD (gastroesophageal reflux disease)     occ  . Arthritis   . Aortic stenosis     moderate AS 12/01/15 echo (Dr. Curt Bears)    His Past Surgical History Is Significant For: Past Surgical History  Procedure Laterality Date  . No past surgeries      no surgeries  . Endarterectomy Right 12/15/2015    Procedure: RIGHT CAROTID ENDARTERECTOMY WITH PATCH ANGIOPLASTY;  Surgeon: Serafina Mitchell, MD;  Location: [facility];  Service: Vascular;  Laterality: Right;  . Patch angioplasty Right 12/15/2015    Procedure: RIGHT CAROTID PATCH ANGIOPLASTY;  Surgeon: Serafina Mitchell, MD;  Location: [facility];  Service: Vascular;  Laterality: Right;    His Family History Is Significant For: Family History  Problem Relation Age of Onset  . Cancer Mother     ovarian cancer  . Cancer Father     Prostate    His Social History Is Significant For: Social History   Social History  . Marital Status: Married    Spouse Name: [name]  . Number of Children: 2  . Years of Education: 13   Occupational History  . Retired     retired Nature conservation officer, [employer]  . Smoking status: Former Smoker    Quit date: 08/07/1983  . Smokeless tobacco: Never Used  . Alcohol Use: Yes     Comment: 2 glasses wine daily quit for now  . Drug Use: No  . Sexual Activity: Not Currently   Other Topics Concern  . None   Social History Narrative   Lives with wife   Caffeine use- coffee 3-4 cups daily    His Allergies Are:  No Known Allergies:   His Current Medications Are:  Outpatient Encounter  Prescriptions as of 01/23/2016  Medication Sig  . aspirin 81 MG tablet Take 81 mg by mouth daily.  Marland Kitchen atorvastatin (LIPITOR) 10 MG tablet Take 1 tablet (10 mg total) by mouth daily.  . clopidogrel (PLAVIX) 75 MG tablet Take 1 tablet (75 mg total) by mouth daily.  Marland Kitchen FIBER PO Take 6 capsules by mouth 2 (two) times daily.  . hydrochlorothiazide (HYDRODIURIL) 25 MG tablet Take 1 tablet (25 mg total) by mouth daily.  . hydrocortisone 1 % ointment Apply 1 application topically 2 (two) times daily.  . [DISCONTINUED] omeprazole (PRILOSEC) 20 MG capsule Take 1 capsule (20 mg total) by mouth daily. (Patient not taking: Reported on 12/08/2015)  . [DISCONTINUED] oxyCODONE-acetaminophen (ROXICET) 5-325 MG tablet Take 1 tablet by mouth every 6 (six) hours as needed.   No facility-administered encounter medications on file as of 01/23/2016.  :  Review of Systems:  Out of a complete 14 point review of systems, all are reviewed and negative with the exception of these symptoms as listed below:   Review of Systems  Neurological:       Patient states that he would like to stop CPAP treatment. He does not feel that it is making a difference in how he feels.     Objective:  Neurologic Exam  Physical Exam Physical Examination:   Filed Vitals:   01/23/16 0933  BP: 146/75  Pulse: 72  Resp: 16    General Examination: The patient is a very pleasant 70 y.o. male in no acute distress. He appears well-developed and well-nourished and well groomed.   HEENT: Normocephalic, atraumatic, pupils are equal, round and reactive to light and accommodation. Extraocular tracking is good without limitation to gaze excursion or nystagmus noted. Normal smooth pursuit is noted. Hearing is grossly intact. Face is symmetric with normal facial animation and normal facial sensation. Speech is clear with no dysarthria noted. There is no hypophonia. There is no lip, neck/head, jaw or voice tremor. Unremarkable right sided carotid  endarterectomy scar. Neck is supple with full range of passive and active motion. There are no carotid bruits on auscultation. Oropharynx exam reveals: mild mouth dryness, good dental hygiene and mild airway crowding, due to smaller airway entry and  mildly redundant soft palate, otherwise a fairly patent airway, tonsils are small, Mallampati class I, tongue protrudes centrally and palate elevates symmetrically.   Chest: Clear to auscultation without wheezing, rhonchi or crackles noted.  Heart: S1+S2+0, regular and normal with 3/6 pansystolic heart murmur (not new, has had a murmur since he was 70 yo he says), no rubs or gallops noted.   Abdomen: Soft, non-tender and non-distended with normal bowel sounds appreciated on auscultation.  Extremities: There is no pitting edema in the distal lower extremities bilaterally. Pedal pulses are intact.  Skin: Warm and dry without trophic changes noted, except for mild evidence of psoriasis in the extensor aspect of both lower legs. There are no varicose veins.  Musculoskeletal: exam reveals no obvious joint deformities, tenderness or joint swelling or erythema.   Neurologically:  Mental status: The patient is awake, alert and oriented in all 4 spheres. His immediate and remote memory, attention, language skills and fund of knowledge are appropriate. There is no evidence of aphasia, agnosia, apraxia or anomia. Speech is clear with normal prosody and enunciation. Thought process is linear. Mood is normal and affect is normal.  Cranial nerves II - XII are as described above under HEENT exam. In addition: shoulder shrug is normal with equal shoulder height noted. Motor exam: Normal bulk, strength and tone is noted. There is no drift, tremor or rebound. Romberg is negative. Reflexes are 1+ throughout. Fine motor skills and coordination: intact with normal finger taps, normal hand movements, normal rapid alternating patting, normal foot taps and normal foot agility.   Cerebellar testing: No dysmetria or intention tremor on finger to nose testing. Heel to shin is unremarkable bilaterally. There is no truncal or gait ataxia.  Sensory exam: intact to light touch in the upper and lower extremities.  Gait, station and balance: He stands easily. No veering to one side is noted. No leaning to one side is noted. Posture is age-appropriate and stance is narrow based. Gait shows normal stride length and normal pace. No problems turning are noted. He turns en bloc. Tandem walk is very slightly challenging for him, stable.  Assessment and Plan:   In summary, Leonard Padilla is a very pleasant 70 year old male with an underlying medical history of hypertension, hyperlipidemia, stroke in February 2017, pneumonia and influenza also in February 2017, psoriasis, and overweight state, who presents for follow-up consultation of his obstructive sleep apnea, after his sleep studies in April. His baseline sleep study on 11/24/2015 showed new moderate obstructive sleep apnea, possibly underestimation of his sleep disordered breathing because of very little sleep. Second sleep study on 12/01/2015 showed fairly decent response to CPAP therapy with some telltale changes noted. However, patient is struggling with CPAP therapy. He would like to stop using it but is encouraged to continue for another at least 3 months. I explained to him that it can take some months to get adjusted to treatment. Furthermore, he has a history of stroke and recent right carotid endarterectomy, in light of his medical history I would like for him to continue CPAP therapy for shocked of sleep apnea. I advised him again regarding the risks and ramifications of untreated OSA when it is moderate to severe. We talked about CPAP compliance. CPAP pressure seems adequate. He prefers to full facemask. I did suggest that he could try over-the-counter melatonin to help him go to sleep a little bit better with the CPAP. He's willing to  try. Physically exam is stable. I would like to see  him back in 3 months, sooner as needed. He is willing to continue with treatment for now and is commended for his willingness to continue with therapy with CPAP. I answered all his questions today and he was in agreement with the plan.  I spent 25 minutes in total face-to-face time with the patient, more than 50% of which was spent in counseling and coordination of care, reviewing test results, reviewing medication and discussing or reviewing the diagnosis of OSA, its prognosis and treatment options.

## 2016-01-26 ENCOUNTER — Encounter: Payer: Self-pay | Admitting: Neurology

## 2016-01-27 ENCOUNTER — Other Ambulatory Visit: Payer: Self-pay | Admitting: Family Medicine

## 2016-01-27 ENCOUNTER — Encounter: Payer: Self-pay | Admitting: Family Medicine

## 2016-01-27 MED ORDER — ATORVASTATIN CALCIUM 10 MG PO TABS: 10.0000 mg | ORAL_TABLET | Freq: Every day | ORAL | Status: DC

## 2016-01-30 ENCOUNTER — Encounter: Payer: Self-pay | Admitting: Diagnostic Neuroimaging

## 2016-01-31 ENCOUNTER — Telehealth: Payer: Self-pay | Admitting: *Deleted

## 2016-01-31 NOTE — Telephone Encounter (Signed)
Per Dr Marjory LiesPenumalli, spoke with patient and advised he stop aspirin 81 mg on 02/01/16. Continue plavix 75 mg daily, have PCP refill. Patient repeated instructions correctly, verbalized understanding.

## 2016-03-06 ENCOUNTER — Other Ambulatory Visit: Payer: Self-pay | Admitting: *Deleted

## 2016-03-06 DIAGNOSIS — I6523 Occlusion and stenosis of bilateral carotid arteries: Secondary | ICD-10-CM

## 2016-03-27 ENCOUNTER — Other Ambulatory Visit: Payer: Self-pay | Admitting: Family Medicine

## 2016-03-27 ENCOUNTER — Encounter: Payer: Self-pay | Admitting: Family Medicine

## 2016-04-03 ENCOUNTER — Encounter: Payer: Self-pay | Admitting: Family Medicine

## 2016-04-03 ENCOUNTER — Ambulatory Visit (INDEPENDENT_AMBULATORY_CARE_PROVIDER_SITE_OTHER): Payer: Medicare Other | Admitting: Family Medicine

## 2016-04-03 VITALS — BP 164/76 | HR 65 | Wt 173.0 lb

## 2016-04-03 DIAGNOSIS — Z125 Encounter for screening for malignant neoplasm of prostate: Secondary | ICD-10-CM | POA: Diagnosis not present

## 2016-04-03 DIAGNOSIS — I6320 Cerebral infarction due to unspecified occlusion or stenosis of unspecified precerebral arteries: Secondary | ICD-10-CM

## 2016-04-03 DIAGNOSIS — Z0000 Encounter for general adult medical examination without abnormal findings: Secondary | ICD-10-CM

## 2016-04-03 MED ORDER — DEXTROMETHORPHAN-QUINIDINE 20-10 MG PO CAPS: ORAL_CAPSULE | ORAL | 1 refills | Status: DC

## 2016-04-03 NOTE — Progress Notes (Signed)
Subjective:    Leonard Padilla is a 70 y.o. male who presents for Medicare Annual/Subsequent preventive examination.   Preventive Screening-Counseling & Management  Tobacco History  Smoking Status  . Former Smoker  . Quit date: 08/07/1983  Smokeless Tobacco  . Never Used   Colonoscopy: Declined, wants no further testing Prostate: Discussed screening risks/beneifts with patient today, will get PSA  Influenza Vaccine: UTD Pneumovax: Needs prevnar next year Td/Tdap: UTD Zoster: UTD  He thinks he has PBA, crying over sad things on TV or sad news when he doesn't feel depressed or genuinely sad.  No history of inappropriate laughing. This all started after his CVA and is embarassing   Problems Prior to Visit 1. CVA  Current Problems (verified) Patient Active Problem List   Diagnosis Date Noted  . Symptomatic carotid artery stenosis 12/15/2015  . Obstructive sleep apnea 11/29/2015  . GERD (gastroesophageal reflux disease) 10/13/2015  . CVA (cerebral vascular accident) ([facility]) 10/13/2015  . Essential hypertension 10/13/2015  . Psoriasis 12/02/2014    Medications Prior to Visit Current Outpatient Prescriptions on File Prior to Visit  Medication Sig Dispense Refill  . aspirin 81 MG tablet Take 81 mg by mouth daily.    Marland Kitchen atorvastatin (LIPITOR) 10 MG tablet Take 1 tablet (10 mg total) by mouth daily. 90 tablet 2  . clopidogrel (PLAVIX) 75 MG tablet Take 1 tablet (75 mg total) by mouth daily. 30 tablet 11  . FIBER PO Take 6 capsules by mouth 2 (two) times daily.    . hydrochlorothiazide (HYDRODIURIL) 25 MG tablet Take 1 tablet (25 mg total) by mouth daily. APPOINTMENT NEEDED FOR FURTHER REFILLS 30 tablet 0  . hydrocortisone 1 % ointment Apply 1 application topically 2 (two) times daily.     No current facility-administered medications on file prior to visit.     Current Medications (verified) Current Outpatient Prescriptions  Medication Sig Dispense Refill  . aspirin 81 MG  tablet Take 81 mg by mouth daily.    Marland Kitchen atorvastatin (LIPITOR) 10 MG tablet Take 1 tablet (10 mg total) by mouth daily. 90 tablet 2  . clopidogrel (PLAVIX) 75 MG tablet Take 1 tablet (75 mg total) by mouth daily. 30 tablet 11  . Dextromethorphan-Quinidine 20-10 MG CAPS One by mouth daily 30 capsule 1  . FIBER PO Take 6 capsules by mouth 2 (two) times daily.    . hydrochlorothiazide (HYDRODIURIL) 25 MG tablet Take 1 tablet (25 mg total) by mouth daily. APPOINTMENT NEEDED FOR FURTHER REFILLS 30 tablet 0  . hydrocortisone 1 % ointment Apply 1 application topically 2 (two) times daily.     No current facility-administered medications for this visit.      Allergies (verified) Review of patient's allergies indicates no known allergies.   PAST HISTORY  Family History Family History  Problem Relation Age of Onset  . Cancer Mother     ovarian cancer  . Cancer Father     Prostate    Social History Social History  Substance Use Topics  . Smoking status: Former Smoker    Quit date: 08/07/1983  . Smokeless tobacco: Never Used  . Alcohol use Yes     Comment: 2 glasses wine daily quit for now    Are there smokers in your home (other than you)?  No  Risk Factors Current exercise habits: Gym/ health club routine includes cardio.  Dietary issues discussed: DASH   Cardiac risk factors: advanced age (older than 86 for men, 36 for women), dyslipidemia  and hypertension.  Depression Screen (Note: if answer to either of the following is "Yes", a more complete depression screening is indicated)   Q1: Over the past two weeks, have you felt down, depressed or hopeless? No  Q2: Over the past two weeks, have you felt little interest or pleasure in doing things? Yes  Have you lost interest or pleasure in daily life? No  Do you often feel hopeless? No  Do you cry easily over simple problems? Yes  Activities of Daily Living In your present state of health, do you have any difficulty performing the  following activities?:  Driving? No Managing money?  No Feeding yourself? No Getting from bed to chair? No Climbing a flight of stairs? No Preparing food and eating?: No Bathing or showering? No Getting dressed: No Getting to the toilet? No Using the toilet:No Moving around from place to place: No In the past year have you fallen or had a near fall?:No   Are you sexually active?  No  Do you have more than one partner?  No  Hearing Difficulties: No Do you often ask people to speak up or repeat themselves? No Do you experience ringing or noises in your ears? No Do you have difficulty understanding soft or whispered voices? No   Do you feel that you have a problem with memory? No  Do you often misplace items? No  Do you feel safe at home?  Yes  Cognitive Testing  Alert? Yes  Normal Appearance?Yes  Oriented to person? Yes  Place? Yes   Time? Yes  Recall of three objects?  Yes  Can perform simple calculations? Yes  Displays appropriate judgment?Yes  Can read the correct time from a watch face?Yes   Advanced Directives have been discussed with the patient? Yes   List the Names of Other Physician/Practitioners you currently use: 1.    Indicate any recent Medical Services you may have received from other than [facility] providers in the past year (date may be approximate).  Immunization History  Administered Date(s) Administered  . Pneumococcal Polysaccharide-23 12/16/2015  . Tdap 08/06/2012  . Zoster 08/06/2010    Screening Tests Health Maintenance  Topic Date Due  . Hepatitis C Screening  09-Sep-1945  . TETANUS/TDAP  08/02/1963  . ZOSTAVAX  08/01/2004  . COLONOSCOPY  04/03/2017 (Originally 08/01/1994)  . PNA vac Low Risk Adult (2 of 2 - PCV13) 12/15/2016  . INFLUENZA VACCINE  Completed    All answers were reviewed with the patient and necessary referrals were made:  Laren BoomHommel, Kimie Pidcock, DO   04/03/2016   History reviewed: allergies, current medications, past family  history, past medical history, past social history, past surgical history and problem list  Review of Systems Review of Systems - General ROS: negative for - chills, fever, night sweats, weight gain or weight loss Ophthalmic ROS: negative for - decreased vision Psychological ROS: negative for - anxiety or depression ENT ROS: negative for - hearing change, nasal congestion, tinnitus or allergies Hematological and Lymphatic ROS: negative for - bleeding problems, bruising or swollen lymph nodes Breast ROS: negative Respiratory ROS: no cough, shortness of breath, or wheezing Cardiovascular ROS: no chest pain or dyspnea on exertion Gastrointestinal ROS: no abdominal pain, change in bowel habits, or black or bloody stools Genito-Urinary ROS: negative for - genital discharge, genital ulcers, incontinence or abnormal bleeding from genitals Musculoskeletal ROS: negative for - joint pain or muscle pain Neurological ROS: negative for - headaches or memory loss Dermatological ROS: negative for lumps,  mole changes, rash and skin lesion changes    Objective:     Vision by Snellen chart: right eye:20/30, left eye:20/25 Blood pressure (!) 164/76, pulse 65, weight 173 lb (78.5 kg). Body mass index is 25.55 kg/m.  General: No Acute Distress HEENT: Atraumatic, normocephalic, conjunctivae normal without scleral icterus.  No nasal discharge, hearing grossly intact, TMs with good landmarks bilaterally with no middle ear abnormalities, posterior pharynx clear without oral lesions. Neck: Supple, trachea midline, no cervical nor supraclavicular adenopathy. Pulmonary: Clear to auscultation bilaterally without wheezing, rhonchi, nor rales. Cardiac: Regular rate and rhythm.  No murmurs, rubs, nor gallops. No peripheral edema.  2+ peripheral pulses bilaterally. Abdomen: Bowel sounds normal.  No masses.  Non-tender without rebound.  Negative Murphy's sign. MSK: Grossly intact, no signs of weakness.  Full strength  throughout upper and lower extremities.  Full ROM in upper and lower extremities.  No midline spinal tenderness. Neuro: Gait unremarkable, CN II-XII grossly intact.  C5-C6 Reflex 2/4 Bilaterally, L4 Reflex 2/4 Bilaterally.  Cerebellar function intact. Skin: No rashes. Psych: Alert and oriented to person/place/time.  Thought process normal. No anxiety/depression.     Assessment:     Up-to-date on preventative services, patient declines colonoscopy. Low suspicion of PBA but I'm okay with him at least trying Nudexta     Plan:     During the course of the visit the patient was educated and counseled about appropriate screening and preventive services including:    F/U 1 month for BP visit  Diet review for nutrition referral?  Not Indicated ____  He wants to try Nudexta for his crying spells  Patient Instructions (the written plan) was given to the patient.  Medicare Attestation I have personally reviewed: The patient's medical and social history Their use of alcohol, tobacco or illicit drugs Their current medications and supplements The patient's functional ability including ADLs,fall risks, home safety risks, cognitive, and hearing and visual impairment Diet and physical activities Evidence for depression or mood disorders  The patient's weight, height, BMI, and visual acuity have been recorded in the chart.  I have made referrals, counseling, and provided education to the patient based on review of the above and I have provided the patient with a written personalized care plan for preventive services.     Laren Boom, DO   04/03/2016

## 2016-04-04 ENCOUNTER — Telehealth: Payer: Self-pay | Admitting: Family Medicine

## 2016-04-04 LAB — CBC
HCT: 41.4 % (ref 38.5–50.0)
Hemoglobin: 13.9 g/dL (ref 13.2–17.1)
MCH: 31 pg (ref 27.0–33.0)
MCHC: 33.6 g/dL (ref 32.0–36.0)
MCV: 92.4 fL (ref 80.0–100.0)
MPV: 9.3 fL (ref 7.5–12.5)
Platelets: 237 10*3/uL (ref 140–400)
RBC: 4.48 MIL/uL (ref 4.20–5.80)
RDW: 13.7 % (ref 11.0–15.0)
WBC: 5.6 10*3/uL (ref 3.8–10.8)

## 2016-04-04 LAB — COMPLETE METABOLIC PANEL WITH GFR
ALT: 25 U/L (ref 9–46)
AST: 22 U/L (ref 10–35)
Albumin: 4.6 g/dL (ref 3.6–5.1)
Alkaline Phosphatase: 69 U/L (ref 40–115)
BUN: 23 mg/dL (ref 7–25)
CO2: 29 mmol/L (ref 20–31)
Calcium: 9.9 mg/dL (ref 8.6–10.3)
Chloride: 102 mmol/L (ref 98–110)
Creat: 1.22 mg/dL — ABNORMAL HIGH (ref 0.70–1.18)
GFR, Est African American: 69 mL/min (ref >=60)
GFR, Est Non African American: 59 mL/min — ABNORMAL LOW (ref >=60)
Glucose, Bld: 97 mg/dL (ref 65–99)
Potassium: 4.9 mmol/L (ref 3.5–5.3)
Sodium: 141 mmol/L (ref 135–146)
Total Bilirubin: 0.6 mg/dL (ref 0.2–1.2)
Total Protein: 7.4 g/dL (ref 6.1–8.1)

## 2016-04-04 LAB — LIPID PANEL
Cholesterol: 223 mg/dL — ABNORMAL HIGH (ref 125–200)
HDL: 75 mg/dL (ref >=40)
LDL Cholesterol: 115 mg/dL (ref <130)
Total CHOL/HDL Ratio: 3 Ratio (ref <=5.0)
Triglycerides: 163 mg/dL — ABNORMAL HIGH (ref <150)
VLDL: 33 mg/dL — ABNORMAL HIGH (ref <30)

## 2016-04-04 LAB — PSA: PSA: 0.5 ng/mL (ref <=4.0)

## 2016-04-04 MED ORDER — ATORVASTATIN CALCIUM 40 MG PO TABS: 40.0000 mg | ORAL_TABLET | Freq: Every day | ORAL | 1 refills | Status: DC

## 2016-04-04 NOTE — Telephone Encounter (Signed)
Pt notified of results and medication changes.

## 2016-04-04 NOTE — Telephone Encounter (Signed)
We please let patient know that his kidney function is stable, blood sugar and liver function are normal and blood cell counts are normal. His PSA prostate test is normal and reassuring. His LDL cholesterol has risen above 100 and I would recommend that he increase his Lipitor to 40 mg daily. Also I would recommend that he return in 3 months to have his cholesterol rechecked.

## 2016-04-25 ENCOUNTER — Ambulatory Visit: Payer: Medicare Other | Admitting: Neurology

## 2016-04-26 ENCOUNTER — Encounter: Payer: Self-pay | Admitting: Family Medicine

## 2016-04-26 ENCOUNTER — Other Ambulatory Visit: Payer: Self-pay | Admitting: Family Medicine

## 2016-06-13 ENCOUNTER — Encounter: Payer: Self-pay | Admitting: Emergency Medicine

## 2016-06-13 ENCOUNTER — Emergency Department
Admission: EM | Admit: 2016-06-13 | Discharge: 2016-06-13 | Disposition: A | Discharge status: Home / Self Care | Payer: Medicare Other | Source: Home / Self Care | Attending: Family Medicine | Admitting: Family Medicine

## 2016-06-13 DIAGNOSIS — S61215A Laceration without foreign body of left ring finger without damage to nail, initial encounter: Secondary | ICD-10-CM | POA: Diagnosis not present

## 2016-06-13 NOTE — Discharge Instructions (Signed)
°  Please keep bandage in place for at least 24 hours unless it gets wet or dirty, or if your finger starts to go numb.  You may gently clean wound with warm soap and water then apply another regular bandage to keep protected and clean until wound is healed.

## 2016-06-13 NOTE — ED Provider Notes (Signed)
CSN: [id number]     Arrival date & time 06/13/16  1515 History   First MD Initiated Contact with Patient 06/13/16 1609     Chief Complaint  Patient presents with  . Extremity Laceration   (Consider location/radiation/quality/duration/timing/severity/associated sxs/prior Treatment) HPI Leonard Padilla is a 70 y.o. male presenting to UC with laceration to his Left ring finger that occurred just PTA. Pt notes he was cutting cardboard for recycle with a newly sharpened utility knife blade. He notes he is on Plavix.  Bleeding controlled PTA with direct pressure. Pt is unsure how deep the cut is but felt it went deep. Pain is minimal at this time. Denies numbness or weakness to his finger.  Last tetanus: ~1 year ago.   Past Medical History:  Diagnosis Date  . Aortic stenosis    moderate AS 12/01/15 echo (Dr. Elberta Fortisamnitz)  . Arthritis   . Essential hypertension 10/13/2015  . Flu 09/2015   influenza A  . GERD (gastroesophageal reflux disease)    occ  . Heart murmur   . Hyperlipidemia   . Pneumonia 09/2015  . Psoriasis   . Sleep apnea    to pick cpap tomorrow 12/13/15  . Stroke [facility](HCC) 09/2015   Past Surgical History:  Procedure Laterality Date  . ENDARTERECTOMY Right 12/15/2015   Procedure: RIGHT CAROTID ENDARTERECTOMY WITH PATCH ANGIOPLASTY;  Surgeon: Nada LibmanVance W Brabham, MD;  [facility];  Service: Vascular;  Laterality: Right;  . NO PAST SURGERIES     no surgeries  . PATCH ANGIOPLASTY Right 12/15/2015   Procedure: RIGHT CAROTID PATCH ANGIOPLASTY;  Surgeon: Nada LibmanVance W Brabham, MD;  [facility];  Service: Vascular;  Laterality: Right;   Family History  Problem Relation Age of Onset  . Cancer Mother     ovarian cancer  . Cancer Father     Prostate   Social History  Substance Use Topics  . Smoking status: Former Smoker    Quit date: 08/07/1983  . Smokeless tobacco: Never Used  . Alcohol use Yes     Comment: 2 glasses wine daily quit for now    Review of Systems  Musculoskeletal: Negative  for arthralgias and joint swelling.  Skin: Positive for wound. Negative for color change.  Neurological: Negative for weakness and numbness.    Allergies  Patient has no known allergies.  Home Medications   Prior to Admission medications   Medication Sig Start Date End Date Taking? Authorizing Provider  aspirin 81 MG tablet Take 81 mg by mouth daily.    Historical Provider, MD  atorvastatin (LIPITOR) 40 MG tablet Take 1 tablet (40 mg total) by mouth daily. 04/04/16   Laren BoomSean Hommel, DO  clopidogrel (PLAVIX) 75 MG tablet Take 1 tablet (75 mg total) by mouth daily. 11/01/15   Suanne MarkerVikram R Penumalli, MD  Dextromethorphan-Quinidine 20-10 MG CAPS One by mouth daily 04/03/16   Laren BoomSean Hommel, DO  FIBER PO Take 6 capsules by mouth 2 (two) times daily.    Historical Provider, MD  hydrochlorothiazide (HYDRODIURIL) 25 MG tablet Take 1 tablet (25 mg total) by mouth daily. 04/26/16   Sunnie NielsenNatalie Alexander, DO  hydrocortisone 1 % ointment Apply 1 application topically 2 (two) times daily.    Historical Provider, MD   Meds Ordered and Administered this Visit  Medications - No data to display  BP 152/77 (BP Location: Left Arm)   Pulse 76   Temp 98.1 F (36.7 C) (Oral)   Ht 5\' 8"  (1.727 m)   Wt 187 lb (  84.8 kg)   SpO2 99%   BMI 28.43 kg/m  No data found.   Physical Exam  Constitutional: He appears well-developed and well-nourished. No distress.  Eyes: EOM are normal.  Neck: Normal range of motion.  Cardiovascular: Normal rate.   Pulmonary/Chest: Effort normal. No respiratory distress.  Musculoskeletal: Normal range of motion. He exhibits no edema or tenderness.  Left ring finger: full ROM, non-tender.  Skin: Skin is warm and dry. Capillary refill takes less than 2 seconds. He is not diaphoretic.  Left ring finger, radial aspect: scant red blood. 1cm Superficial laceration. No foreign bodies seen or palpated.  Nursing note and vitals reviewed.   Urgent Care Course   Clinical Course     Procedures  (including critical care time)  Labs Review Labs Reviewed - No data to display  Imaging Review No results found.    MDM   1. Laceration of left ring finger without foreign body without damage to nail, initial encounter    Superficial laceration to Left ring finger. No indication for wound closure. Wound cleaned, bacitracin and pressure bandage applied. Home care instructions given. F/u with PCP or UC as needed.    Junius FinnerErin O'Malley, PA-C 06/13/16 1714

## 2016-06-13 NOTE — ED Triage Notes (Signed)
Left 4th finger laceration

## 2016-07-31 ENCOUNTER — Other Ambulatory Visit: Payer: Self-pay | Admitting: Osteopathic Medicine

## 2016-08-03 ENCOUNTER — Ambulatory Visit (INDEPENDENT_AMBULATORY_CARE_PROVIDER_SITE_OTHER): Payer: Medicare Other | Admitting: Physician Assistant

## 2016-08-03 ENCOUNTER — Encounter: Payer: Self-pay | Admitting: Physician Assistant

## 2016-08-03 VITALS — BP 163/89 | HR 62 | Wt 189.0 lb

## 2016-08-03 DIAGNOSIS — I6521 Occlusion and stenosis of right carotid artery: Secondary | ICD-10-CM | POA: Diagnosis not present

## 2016-08-03 DIAGNOSIS — N183 Chronic kidney disease, stage 3 unspecified: Secondary | ICD-10-CM

## 2016-08-03 DIAGNOSIS — I10 Essential (primary) hypertension: Secondary | ICD-10-CM

## 2016-08-03 DIAGNOSIS — Z8673 Personal history of transient ischemic attack (TIA), and cerebral infarction without residual deficits: Secondary | ICD-10-CM | POA: Diagnosis not present

## 2016-08-03 DIAGNOSIS — Z0000 Encounter for general adult medical examination without abnormal findings: Secondary | ICD-10-CM | POA: Diagnosis not present

## 2016-08-03 HISTORY — DX: Personal history of transient ischemic attack (TIA), and cerebral infarction without residual deficits: Z86.73

## 2016-08-03 LAB — LIPID PANEL
Cholesterol: 198 mg/dL (ref <200)
HDL: 60 mg/dL (ref >40)
LDL Cholesterol: 115 mg/dL — ABNORMAL HIGH (ref <100)
Total CHOL/HDL Ratio: 3.3 Ratio (ref <5.0)
Triglycerides: 114 mg/dL (ref <150)
VLDL: 23 mg/dL (ref <30)

## 2016-08-03 LAB — CBC
HCT: 41.5 % (ref 38.5–50.0)
Hemoglobin: 14 g/dL (ref 13.2–17.1)
MCH: 31.2 pg (ref 27.0–33.0)
MCHC: 33.7 g/dL (ref 32.0–36.0)
MCV: 92.4 fL (ref 80.0–100.0)
MPV: 9.1 fL (ref 7.5–12.5)
Platelets: 211 10*3/uL (ref 140–400)
RBC: 4.49 MIL/uL (ref 4.20–5.80)
RDW: 13.4 % (ref 11.0–15.0)
WBC: 4.9 10*3/uL (ref 3.8–10.8)

## 2016-08-03 LAB — COMPREHENSIVE METABOLIC PANEL
ALT: 30 U/L (ref 9–46)
AST: 26 U/L (ref 10–35)
Albumin: 4.3 g/dL (ref 3.6–5.1)
Alkaline Phosphatase: 71 U/L (ref 40–115)
BUN: 27 mg/dL — ABNORMAL HIGH (ref 7–25)
CO2: 26 mmol/L (ref 20–31)
Calcium: 9.5 mg/dL (ref 8.6–10.3)
Chloride: 104 mmol/L (ref 98–110)
Creat: 1.23 mg/dL — ABNORMAL HIGH (ref 0.70–1.18)
Glucose, Bld: 97 mg/dL (ref 65–99)
Potassium: 4.6 mmol/L (ref 3.5–5.3)
Sodium: 139 mmol/L (ref 135–146)
Total Bilirubin: 0.5 mg/dL (ref 0.2–1.2)
Total Protein: 6.9 g/dL (ref 6.1–8.1)

## 2016-08-03 MED ORDER — DEXTROMETHORPHAN-QUINIDINE 20-10 MG PO CAPS: ORAL_CAPSULE | ORAL | 1 refills | Status: DC

## 2016-08-03 MED ORDER — VALSARTAN 40 MG PO TABS: 40.0000 mg | ORAL_TABLET | Freq: Every day | ORAL | 0 refills | Status: DC

## 2016-08-03 MED ORDER — CLOPIDOGREL BISULFATE 75 MG PO TABS: 75.0000 mg | ORAL_TABLET | Freq: Every day | ORAL | 2 refills | Status: DC

## 2016-08-03 MED ORDER — CLOPIDOGREL BISULFATE 75 MG PO TABS: 75.0000 mg | ORAL_TABLET | Freq: Every day | ORAL | 1 refills | Status: DC

## 2016-08-03 MED ORDER — HYDROCHLOROTHIAZIDE 25 MG PO TABS: 25.0000 mg | ORAL_TABLET | Freq: Every day | ORAL | 1 refills | Status: DC

## 2016-08-03 MED ORDER — ATORVASTATIN CALCIUM 40 MG PO TABS: 40.0000 mg | ORAL_TABLET | Freq: Every day | ORAL | 1 refills | Status: DC

## 2016-08-03 MED ORDER — DEXTROMETHORPHAN-QUINIDINE 20-10 MG PO CAPS: ORAL_CAPSULE | ORAL | 2 refills | Status: DC

## 2016-08-03 MED ORDER — ATORVASTATIN CALCIUM 40 MG PO TABS: 40.0000 mg | ORAL_TABLET | Freq: Every day | ORAL | 2 refills | Status: DC

## 2016-08-03 MED ORDER — HYDROCHLOROTHIAZIDE 25 MG PO TABS: 25.0000 mg | ORAL_TABLET | Freq: Every day | ORAL | 2 refills | Status: DC

## 2016-08-03 NOTE — Progress Notes (Signed)
HPI:                                                                Leonard Padilla is a 70 y.o. male who [facility] Medicine today to establish care and refill meds  HTN: on monotherapy with HCTZ 25mg . Does not check BP's at home.  Denies headache, chest pain, dyspnea, lightheadedness, and edema.   Carotid Artery Stenosis: patient is followed by vascular surgery. He is s/p right carotid endarterectomy 12/15/15. He is taking Lipitor, Plavix and baby aspirin daily.  Hx of CVA: occurred in Feb 2017. He is followed by Neurology. 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   No new concerns today.  Health Maintenance Health Maintenance  Topic Date Due  . Hepatitis C Screening  [birth year]  . COLONOSCOPY  04/03/2017 (Originally 08/01/1994)  . PNA vac Low Risk Adult (2 of 2 - PCV13) 12/15/2016  . TETANUS/TDAP  08/06/2022  . INFLUENZA VACCINE  Completed  . ZOSTAVAX  Completed  Declines further colonoscopies   Past Medical History:  Diagnosis Date  . Aortic stenosis    moderate AS 12/01/15 echo (Dr. Elberta Fortisamnitz)  . Arthritis   . Essential hypertension 10/13/2015  . Flu 09/2015   influenza A  . GERD (gastroesophageal reflux disease)    occ  . Heart murmur   . Hyperlipidemia   . Pneumonia 09/2015  . Psoriasis   . Sleep apnea    to pick cpap tomorrow 12/13/15  . Stroke [facility](HCC) 09/2015   Past Surgical History:  Procedure Laterality Date  . ENDARTERECTOMY Right 12/15/2015   Procedure: RIGHT CAROTID ENDARTERECTOMY WITH PATCH ANGIOPLASTY;  Surgeon: Nada LibmanVance W Brabham, MD;  [facility];  Service: Vascular;  Laterality: Right;  . NO PAST SURGERIES     no surgeries  . PATCH ANGIOPLASTY Right 12/15/2015   Procedure: RIGHT CAROTID PATCH ANGIOPLASTY;  Surgeon: Nada LibmanVance W Brabham, MD;  [facility];  Service: Vascular;  Laterality: Right;   Social History  Substance Use Topics  . Smoking  status: Former Smoker    Quit date: 08/07/1983  . Smokeless tobacco: Never Used  . Alcohol use Yes     Comment: 2 glasses wine daily quit for now   family history includes Cancer in his father and mother.  Review of Systems  Constitutional: Negative for chills, fever, malaise/fatigue and weight loss.  HENT: Positive for tinnitus. Negative for hearing loss.   Eyes: Positive for blurred vision (wears corrective lenses).  Respiratory: Negative for shortness of breath and wheezing.        Sleep apnea  Cardiovascular: Negative for chest pain, palpitations and orthopnea.  Gastrointestinal: Positive for constipation. Negative for abdominal pain and blood in stool.  Genitourinary: Negative.   Musculoskeletal: Negative.   Skin: Negative for rash (psoriasis, legs).  Neurological: Negative for tremors, sensory change, speech change, focal weakness and headaches.  Endo/Heme/Allergies: Negative.   Psychiatric/Behavioral: Negative for depression. The patient is not nervous/anxious and does not have insomnia.        Inappropriate crying    Medications: Current Outpatient Prescriptions  Medication Sig Dispense Refill  . aspirin 81 MG tablet Take 81 mg by mouth daily.    .Marland Kitchen  atorvastatin (LIPITOR) 40 MG tablet Take 1 tablet (40 mg total) by mouth daily. 90 tablet 1  . clopidogrel (PLAVIX) 75 MG tablet Take 1 tablet (75 mg total) by mouth daily. 30 tablet 11  . Dextromethorphan-Quinidine 20-10 MG CAPS One by mouth daily 30 capsule 1  . hydrochlorothiazide (HYDRODIURIL) 25 MG tablet Take 1 tablet (25 mg total) by mouth daily. APPOINTMENT NEEDED FOR FURTHER REFILLS 30 tablet 0   No current facility-administered medications for this visit.    No Known Allergies     Objective:  BP (!) 163/89   Pulse 62   Wt 189 lb (85.7 kg)   BMI 28.74 kg/m  Gen: well-groomed, cooperative, not ill-appearing, no distress HEENT: normal conjunctiva, TM's clear, oropharynx clear, moist mucus membranes, no  thyromegaly or tenderness Lungs: Normal work of breathing, clear to auscultation bilaterally Heart: Normal rate, regular rhythm, s1 and s2 distinct, pansystolic murmur that radiates over the precordium Abd: Soft. Nondistended, Nontender Neuro: alert and oriented x 3, slight EOM deficit with left upgaze, PERRLA, DTR's intact MSK: strength 5/5 and symmetric, normal gait Extremities: distal pulses intact, no peripheral edema Skin: warm and dry, no rashes or lesions on exposed skin Psych: normal affect, pleasant mood, normal speech and thought content   No results found for this or any previous visit (from the past 72 hour(s)). No results found.    Assessment and Plan: 70 y.o. male with   1. Status post CVA - negative PHQ9 today - Dextromethorphan-Quinidine 20-10 MG CAPS; One by mouth daily    2. Essential hypertension - BP out of range today. Patient on max dose of HCTZ. Adding low-dose Valsartan. - instructed to return for BP check in 2 weeks - hydrochlorothiazide (HYDRODIURIL) 25 MG tablet; Take 1 tablet (25 mg total) by mouth daily. APPOINTMENT NEEDED FOR FURTHER REFILLS  Dispense: 90 tablet; Refill: 2 - valsartan (DIOVAN) 40 MG tablet; Take 1 tablet (40 mg total) by mouth daily.   3. Symptomatic stenosis of right carotid artery - cont to follow-up with vascular - cont baby ASA daily - clopidogrel (PLAVIX) 75 MG tablet; Take 1 tablet (75 mg total) by mouth daily.  Dispense: 90 tablet; Refill: 2 - atorvastatin (LIPITOR) 40 MG tablet; Take 1 tablet (40 mg total) by mouth daily.  Dispense: 90 tablet; Refill: 2  4. Encounter for preventative adult health care examination - CBC - Comprehensive metabolic panel - Hemoglobin A1c - Lipid panel - TSH - VITAMIN D 25 Hydroxy (Vit-D Deficiency, Fractures)   No orders of the defined types were placed in this encounter.    Patient education and anticipatory guidance given Patient agrees with treatment plan Follow-up in 6 months or  sooner as needed  Levonne Hubertharley E. Afnan Cadiente PA-C

## 2016-08-03 NOTE — Patient Instructions (Addendum)
I am starting you on an additional low-dose blood pressure medicine called Valsartan (see below) Please return in 1 week for a nurse visit Blood Pressure check Fasting labs ordered Medications refilled for 90 day supply with 2 refills Otherwise, see you in 6 months for follow-up Have a happy new year!  Valsartan tablets What is this medicine? VALSARTAN (val SAR tan) is used to treat high blood pressure. This drug is also used to treat patients with heart failure and patients who have had a heart attack. This medicine may be used for other purposes; ask your health care provider or pharmacist if you have questions. COMMON BRAND NAME(S): Diovan What should I tell my health care provider before I take this medicine? They need to know if you have any of these conditions: -heart failure -kidney disease -liver disease -an unusual or allergic reaction to valsartan, other medicines, foods, dyes, or preservatives -pregnant or trying to get pregnant -breast-feeding How should I use this medicine? Take this medicine by mouth with a glass of water. Follow the directions on the prescription label. This medicine can be taken with or without food. Take your medicine at regular intervals. Do not take it more often than directed. Talk to your pediatrician regarding the use of this medicine in children. While this drug may be prescribed for children as young as 6 years for selected conditions, precautions do apply. Overdosage: If you think you have taken too much of this medicine contact a poison control center or emergency room at once. NOTE: This medicine is only for you. Do not share this medicine with others. What if I miss a dose? If you miss a dose, take it as soon as you can. If it is almost time for your next dose, take only that dose. Do not take double or extra doses. What may interact with this medicine? -blood pressure medicines -lithium -diuretics, especially triamterene, spironolactone or  amiloride -potassium salts or potassium supplements This list may not describe all possible interactions. Give your health care provider a list of all the medicines, herbs, non-prescription drugs, or dietary supplements you use. Also tell them if you smoke, drink alcohol, or use illegal drugs. Some items may interact with your medicine. What should I watch for while using this medicine? Visit your doctor or health care professional for regular checks on your progress. Check your blood pressure as directed. Ask your doctor or health care professional what your blood pressure should be and when you should contact him or her. Call your doctor or health care professional if you notice an irregular or fast heart beat. Women should inform their doctor if they wish to become pregnant or think they might be pregnant. There is a potential for serious side effects to an unborn child, particularly in the second or third trimester. Talk to your health care professional or pharmacist for more information. You may get drowsy or dizzy. Do not drive, use machinery, or do anything that needs mental alertness until you know how this drug affects you. Do not stand or sit up quickly, especially if you are an older patient. This reduces the risk of dizzy or fainting spells. Alcohol can make you more drowsy and dizzy. Avoid alcoholic drinks. Avoid salt substitutes unless you are told otherwise by your doctor or health care professional. Do not treat yourself for coughs, colds, or pain while you are taking this medicine without asking your doctor or health care professional for advice. Some ingredients may increase your blood pressure.  What side effects may I notice from receiving this medicine? Side effects that you should report to your doctor or health care professional as soon as possible: -confusion, dizziness, light headedness or fainting spells -decreased amount of urine passed -difficulty breathing or swallowing,  hoarseness, or tightening of the throat -fast or irregular heart beat, palpitations, or chest pain -skin rash, itching -swelling of your face, lips, tongue, hands, or feet Side effects that usually do not require medical attention (report to your doctor or health care professional if they continue or are bothersome): -cough -decreased sexual function -headache -nausea or stomach pain This list may not describe all possible side effects. Call your doctor for medical advice about side effects. You may report side effects to FDA at 1-800-FDA-1088. Where should I keep my medicine? Keep out of the reach of children. Store at room temperature between 15 and 30 degrees C (59 and 86 degrees F). Keep your medicine container tightly closed and protect from moisture. Throw away any unused medicine after the expiration date. NOTE: This sheet is a summary. It may not cover all possible information. If you have questions about this medicine, talk to your doctor, pharmacist, or health care provider.  2017 Elsevier/Gold Standard (2012-10-23 12:39:59)

## 2016-08-04 ENCOUNTER — Encounter: Payer: Self-pay | Admitting: Physician Assistant

## 2016-08-04 DIAGNOSIS — N1831 Chronic kidney disease, stage 3a: Secondary | ICD-10-CM

## 2016-08-04 DIAGNOSIS — N183 Chronic kidney disease, stage 3 unspecified: Secondary | ICD-10-CM | POA: Insufficient documentation

## 2016-08-04 HISTORY — DX: Chronic kidney disease, stage 3a: N18.31

## 2016-08-04 HISTORY — DX: Chronic kidney disease, stage 3 unspecified: N18.30

## 2016-08-04 LAB — HEMOGLOBIN A1C
Hgb A1c MFr Bld: 5.4 % (ref <5.7)
Mean Plasma Glucose: 108 mg/dL

## 2016-08-04 LAB — TSH: TSH: 3.28 mIU/L (ref 0.40–4.50)

## 2016-08-04 LAB — VITAMIN D 25 HYDROXY (VIT D DEFICIENCY, FRACTURES): Vit D, 25-Hydroxy: 59 ng/mL (ref 30–100)

## 2016-08-04 NOTE — Progress Notes (Addendum)
CMP shows CKD stage 3a (estimated GFR 58). This has been present for about 7 months, but not previously documented   Lab Results  Component Value Date   CREATININE 1.23 (H) 08/03/2016   CREATININE 1.22 (H) 04/03/2016   CREATININE 1.43 (H) 12/16/2015   Plan: - Added Valsartan yesterday for better blood pressure control; goal 130/80. He is returning in 1-2 weeks for a BP check. Will check urine microalbumin at that time.  Levonne Hubertharley E. Cummings PA-C

## 2016-08-04 NOTE — Addendum Note (Signed)
Addended by: Gena FrayUMMINGS, Joseph Johns E on: 08/04/2016 02:07 PM   Modules accepted: Orders

## 2016-08-27 ENCOUNTER — Ambulatory Visit (INDEPENDENT_AMBULATORY_CARE_PROVIDER_SITE_OTHER): Payer: Medicare Other | Admitting: Physician Assistant

## 2016-08-27 VITALS — BP 124/79 | HR 88 | Temp 98.4°F | Wt 184.0 lb

## 2016-08-27 DIAGNOSIS — R55 Syncope and collapse: Secondary | ICD-10-CM

## 2016-08-27 DIAGNOSIS — J069 Acute upper respiratory infection, unspecified: Secondary | ICD-10-CM | POA: Diagnosis not present

## 2016-08-27 DIAGNOSIS — R42 Dizziness and giddiness: Secondary | ICD-10-CM | POA: Insufficient documentation

## 2016-08-27 HISTORY — DX: Syncope and collapse: R55

## 2016-08-27 HISTORY — DX: Dizziness and giddiness: R42

## 2016-08-27 MED ORDER — IPRATROPIUM BROMIDE 0.06 % NA SOLN: 1.0000 | Freq: Four times a day (QID) | NASAL | 0 refills | Status: DC | PRN

## 2016-08-27 NOTE — Patient Instructions (Addendum)
Make an appointment with your Cardiologist, Dr. Clifton JamesMcAlhany to follow-up on this dizziness/presyncope  For your post-nasal drip: 1. Atrovent nasal spray up to 4 times daily 2. SUDAED PE (PHENYLEPHRINE) - 10 mg every 6 hours, maximum 40 mg per day 3. Continue Allegra 60mg  twice daily 4. Tylenol 500mg  every 6 hours as needed for headache Contact me if you are not feeling better within the next 3 days   Near-Syncope Introduction Near-syncope is when you suddenly become weak or dizzy, or you feel like you might pass out (faint). During an episode of near-syncope, you may:  Feel dizzy or light-headed.  Feel nauseous.  See all white or all black in your field of vision.  Have cold, clammy skin. This condition is caused by a sudden decrease in blood flow to the brain. This decrease can result from various causes, but most of those causes are not dangerous. However, near-syncope can be a sign of a serious medical problem, so it is important to seek medical care. If you fainted, get medical help right away.Call your local emergency services (911 in the [place] Do not drive yourself to the hospital. Follow these instructions at home: Pay attention to any changes in your symptoms. Take these actions to help with your condition:  Have someone stay with you until you feel stable.  Do not drive, use machinery, or play sports until your health care provider says it is okay.  Keep all follow-up visits as told by your health care provider. This is important.  If you start to feel like you might faint, lie down right away and raise (elevate) your feet above the level of your heart. Breathe deeply and steadily. Wait until all of the symptoms have passed.  Drink enough fluid to keep your urine clear or pale yellow.  If you are taking blood pressure or heart medicine, get up slowly and take several minutes to sit and then stand. This can reduce dizziness.  Take over-the-counter and prescription  medicines only as told by your health care provider. Get help right away if:  You have a severe headache.  You have unusual pain in your chest, abdomen, or back.  You are bleeding from your mouth or rectum, or you have black or tarry stool.  You have a very fast or irregular heartbeat (palpitations).  You faint once or repeatedly.  You have a seizure.  You are confused.  You have trouble walking.  You have severe weakness.  You have vision problems. These symptoms may represent a serious problem that is an emergency. Do not wait to see if your symptoms will go away. Get medical help right away. Call your local emergency services (911 in the [place] Do not drive yourself to the hospital.  This information is not intended to replace advice given to you by your health care provider. Make sure you discuss any questions you have with your health care provider. Document Released: 07/23/2005 Document Revised: 12/29/2015 Document Reviewed: 04/06/2015  2017 Elsevier

## 2016-08-27 NOTE — Progress Notes (Signed)
HPI:                                                                Leonard Padilla is a 71 y.o. male who presents to [facility] Padilla: Primary Care Sports Medicine today for a sick visit  Patient reports cold symptoms x 10 days, including sinus congestion, post-nasal drip, sore throat, and cough. States that the symptoms are improving and he was going to cancel his appointment today, but he had a dizzy spell yesterday. Patient reports he became clammy and felt like he was going to pass out while showering yesterday. Stated that he was able to lay down and symptoms improved. Denied loss of consciousness. Patient states he stayed in bed for most of the day yesterday. Denies vision change or focal weakness. Denies chest pain.   Health Maintenance Health Maintenance  Topic Date Due  . Hepatitis C Screening  12-19-1945  . COLONOSCOPY  04/03/2017 (Originally 08/01/1994)  . PNA vac Low Risk Adult (2 of 2 - PCV13) 12/15/2016  . TETANUS/TDAP  08/06/2022  . INFLUENZA VACCINE  Completed  . ZOSTAVAX  Completed    Past Medical History:  Diagnosis Date  . Aortic stenosis    moderate AS 12/01/15 echo (Dr. Elberta Padilla)  . Arthritis   . Essential hypertension 10/13/2015  . Flu 09/2015   influenza A  . GERD (gastroesophageal reflux disease)    occ  . Heart murmur   . Hyperlipidemia   . Pneumonia 09/2015  . Psoriasis   . Sleep apnea    to pick cpap tomorrow 12/13/15  . Stroke [facility]) 09/2015   Past Surgical History:  Procedure Laterality Date  . ENDARTERECTOMY Right 12/15/2015   Procedure: RIGHT CAROTID ENDARTERECTOMY WITH PATCH ANGIOPLASTY;  Surgeon: Leonard Libman, MD;  Location: [facility] OR;  Service: Vascular;  Laterality: Right;  . NO PAST SURGERIES     no surgeries  . PATCH ANGIOPLASTY Right 12/15/2015   Procedure: RIGHT CAROTID PATCH ANGIOPLASTY;  Surgeon: Leonard Libman, MD;  Location: [facility] OR;  Service: Vascular;  Laterality: Right;   Social History  Substance Use Topics  . Smoking  status: Former Smoker    Quit date: 08/07/1983  . Smokeless tobacco: Never Used  . Alcohol use Yes     Comment: 2 glasses wine daily quit for now   family history includes Cancer in his father and mother.  ROS: negative except as noted in the HPI  Medications: Current Outpatient Prescriptions  Medication Sig Dispense Refill  . aspirin 81 MG tablet Take 81 mg by mouth daily.    Marland Kitchen atorvastatin (LIPITOR) 40 MG tablet Take 1 tablet (40 mg total) by mouth daily. 90 tablet 2  . clopidogrel (PLAVIX) 75 MG tablet Take 1 tablet (75 mg total) by mouth daily. 90 tablet 2  . Dextromethorphan-Quinidine 20-10 MG CAPS One by mouth daily 90 capsule 2  . hydrochlorothiazide (HYDRODIURIL) 25 MG tablet Take 1 tablet (25 mg total) by mouth daily. APPOINTMENT NEEDED FOR FURTHER REFILLS 90 tablet 2  . valsartan (DIOVAN) 40 MG tablet Take 1 tablet (40 mg total) by mouth daily. 30 tablet 0   No current facility-administered medications for this visit.    No Known Allergies     Objective:  BP 124/79  Pulse 88   Temp 98.4 F (36.9 C) (Oral)   Wt 184 lb (83.5 kg)   SpO2 99%   BMI 27.98 kg/m  Gen: well-groomed, cooperative, not ill-appearing, no distress HEENT: normal conjunctiva, TM's clear, oropharynx clear, moist mucus membranes, no thyromegaly or tenderness Lungs: Normal work of breathing, clear to auscultation bilaterally Heart: Normal rate, regular rhythm, s1 and s2 distinct, grade 3/6 aortic ejection murmur that radiates to the carotids, Abd: Soft. Nondistended, Nontender Neuro: alert and oriented x 3, EOM's intact, PERRLA, DTR's intact MSK: strength 5/5 and symmetric, normal gait Extremities: distal pulses intact, no peripheral edema Skin: warm and dry, no rashes or lesions on exposed skin Psych: normal affect, pleasant mood, normal speech and thought content   Transthoracic Echocardiography  Patient:    Leonard, Padilla MR #:       [id number] Study Date: 12/01/2015 Gender:     M Age:         46 Height:     175.3 cm Weight:     77.7 kg BSA:        1.95 m^2 Pt. Status: Room:   SONOGRAPHER  Leonard Padilla, RDCS  PERFORMING   [facility], Outpatient  ATTENDING    Leonard Elberta Fortis, MD  ORDERING     Leonard Elberta Fortis, MD  REFERRING    Leonard Elberta Fortis, MD  cc:  ------------------------------------------------------------------- LV EF: 55% -   60%  ------------------------------------------------------------------- Indications:      Pre-operative cardiovascular exam (Z01.810 / V72.81).  ------------------------------------------------------------------- History:   PMH:  History of carotid stenosis. Acquired from the patient and from the patient&'s chart.  Stroke.  Risk factors: Former tobacco use. Hypertension. Dyslipidemia.  ------------------------------------------------------------------- Study Conclusions  - Left ventricle: The cavity size was normal. Systolic function was   normal. The estimated ejection fraction was in the range of 55%   to 60%. Wall motion was normal; there were no regional wall   motion abnormalities. Doppler parameters are consistent with   abnormal left ventricular relaxation (grade 1 diastolic   dysfunction). - Aortic valve: Valve mobility was restricted. There was moderate   stenosis. Peak velocity (S): 349 cm/s. Mean gradient (S): 28 mm   Hg.  ------------------------------------------------------------------- Labs, prior tests, procedures, and surgery: ECG.     Abnormal. Transthoracic echocardiography.  M-mode, complete 2D, spectral Doppler, and color Doppler.  Birthdate:  Patient birthdate: [birth year].  Age:  Patient is 71 yr old.  Sex:  Gender: male. BMI: 25.3 kg/m^2.  Blood pressure:     120/90  Patient status: Outpatient.  Study date:  Study date: 12/01/2015. Study time: 11:30 AM.  Location:  [place]  3  -------------------------------------------------------------------  ------------------------------------------------------------------- Left ventricle:  The cavity size was normal. Systolic function was normal. The estimated ejection fraction was in the range of 55% to 60%. Wall motion was normal; there were no regional wall motion abnormalities. Doppler parameters are consistent with abnormal left ventricular relaxation (grade 1 diastolic dysfunction).  ------------------------------------------------------------------- Aortic valve:   Trileaflet; moderately thickened, moderately calcified leaflets. Valve mobility was restricted.  Doppler: There was moderate stenosis.   There was no regurgitation.    VTI ratio of LVOT to aortic valve: 0.33. Valve area (VTI): 1.03 cm^2. Indexed valve area (VTI): 0.53 cm^2/m^2. Peak velocity ratio of LVOT to aortic valve: 0.34. Valve area (Vmax): 1.06 cm^2. Indexed valve area (Vmax): 0.54 cm^2/m^2. Mean velocity ratio of LVOT to aortic valve: 0.31. Valve area (Vmean): 0.97 cm^2. Indexed valve area (Vmean): 0.49 cm^2/m^2.    Mean gradient (S):  28 mm Hg. Peak gradient (S): 49 mm Hg.  ------------------------------------------------------------------- Aorta:  Aortic root: The aortic root was normal in size.  ------------------------------------------------------------------- Mitral valve:   Structurally normal valve.   Mobility was not restricted.  Doppler:  Transvalvular velocity was within the normal range. There was no evidence for stenosis. There was trivial regurgitation.    Peak gradient (D): 2 mm Hg.  ------------------------------------------------------------------- Left atrium:  The atrium was normal in size.  ------------------------------------------------------------------- Right ventricle:  The cavity size was normal. Wall thickness was normal. Systolic function was  normal.  ------------------------------------------------------------------- Pulmonic valve:    Structurally normal valve.   Cusp separation was normal.  Doppler:  Transvalvular velocity was within the normal range. There was no evidence for stenosis. There was no regurgitation.  ------------------------------------------------------------------- Tricuspid valve:   Structurally normal valve.    Doppler: Transvalvular velocity was within the normal range. There was mild regurgitation.  ------------------------------------------------------------------- Pulmonary artery:   The main pulmonary artery was normal-sized. Systolic pressure was within the normal range.  ------------------------------------------------------------------- Right atrium:  The atrium was normal in size. 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  ------------------------------------------------------------------- Pericardium:  There was no pericardial effusion.  ------------------------------------------------------------------- Systemic veins: Inferior vena cava: The vessel was normal in size.  ------------------------------------------------------------------- Measurements   Left ventricle                            Value          Reference  LV ID, ED, PLAX chordal           (L)     41.8  mm       43 - 52  LV ID, ES, PLAX chordal                   23.4  mm       23 - 38  LV fx shortening, PLAX chordal            44    %        >=29  LV PW thickness, ED                       10.3  mm       ---------  IVS/LV PW ratio, ED                       0.8            <=1.3  Stroke volume, 2D                         80    ml       ---------  Stroke volume/bsa, 2D                     41    ml/m^2   ---------  LV e&', lateral                            8.66  cm/s     ---------  LV E/e&', lateral                          8.6            ---------  LV s&', lateral  9.32  cm/s      ---------  LV e&', medial                             5.92  cm/s     ---------  LV E/e&', medial                           12.58          ---------  LV e&', average                            7.29  cm/s     ---------  LV E/e&', average                          10.22          ---------    Ventricular septum                        Value          Reference  IVS thickness, ED                         8.23  mm       ---------    LVOT                                      Value          Reference  LVOT ID, S                                20    mm       ---------  LVOT area                                 3.14  cm^2     ---------  LVOT peak velocity, S                     118   cm/s     ---------  LVOT mean velocity, S                     72.6  cm/s     ---------  LVOT VTI, S                               25.5  cm       ---------  LVOT peak gradient, S                     6     mm Hg    ---------    Aortic valve                              Value          Reference  Aortic valve peak velocity, S             349  cm/s     ---------  Aortic valve mean velocity, S             236   cm/s     ---------  Aortic valve VTI, S                       77.8  cm       ---------  Aortic mean gradient, S                   28    mm Hg    ---------  Aortic peak gradient, S                   49    mm Hg    ---------  VTI ratio, LVOT/AV                        0.33           ---------  Aortic valve area, VTI                    1.03  cm^2     ---------  Aortic valve area/bsa, VTI                0.53  cm^2/m^2 ---------  Velocity ratio, peak, LVOT/AV             0.34           ---------  Aortic valve area, peak velocity          1.06  cm^2     ---------  Aortic valve area/bsa, peak               0.54  cm^2/m^2 ---------  velocity  Velocity ratio, mean, LVOT/AV             0.31           ---------  Aortic valve area, mean velocity          0.97  cm^2     ---------  Aortic valve area/bsa, mean               0.49   cm^2/m^2 ---------  velocity    Aorta                                     Value          Reference  Aortic root ID, ED                        32    mm       ---------    Left atrium                               Value          Reference  LA ID, A-P, ES                            36    mm       ---------  LA ID/bsa, A-P                            1.84  cm/m^2   <=2.2  LA volume, S                              55    ml       ---------  LA volume/bsa, S                          28.1  ml/m^2   ---------  LA volume, ES, 1-p A4C                    48    ml       ---------  LA volume/bsa, ES, 1-p A4C                24.6  ml/m^2   ---------  LA volume, ES, 1-p A2C                    61    ml       ---------  LA volume/bsa, ES, 1-p A2C                31.2  ml/m^2   ---------    Mitral valve                              Value          Reference  Mitral E-wave peak velocity               74.5  cm/s     ---------  Mitral A-wave peak velocity               106   cm/s     ---------  Mitral deceleration time                  218   ms       150 - 230  Mitral peak gradient, D                   2     mm Hg    ---------  Mitral E/A ratio, peak                    0.7            ---------    Right ventricle                           Value          Reference  RV s&', lateral, S                         9.32  cm/s     ---------  Legend: (L)  and  (H)  mark values outside specified reference range.  ------------------------------------------------------------------- Prepared and Electronically Authenticated by  Donato Schultz, M.D. 2017-04-27T15:41:50  I personally reviewed the ECG performed on 11/30/15 and compared it to ECG performed today, which showed normal sinus rhythm at 74 bpm, normal PRI, normal Qtc and no acute change from prior.    Assessment and Plan: 71 y.o. male with   Postural dizziness with presyncope - I feel this is likely related to patient's moderated Aortic Stenosis. ECHO from  12/01/15 shows valve area of 1.06 cm^2, moderately thickened calcified leaflets and restricted  valve mobility. Systolic function normal  - Recommend close follow-up with Cardiology. Patient previously referred to Dr. Clifton James and I provided him with the contact information for his practice  Acute upper respiratory infection - symptomatic management - atrovent nasal spray - follow-up in 3 days if no improvement   Patient education and anticipatory guidance given Patient agrees with treatment plan Follow-up as needed if symptoms worsen or fail to improve  Levonne Hubert PA-C

## 2016-08-28 ENCOUNTER — Encounter: Payer: Self-pay | Admitting: Physician Assistant

## 2016-08-31 ENCOUNTER — Other Ambulatory Visit: Payer: Self-pay | Admitting: Physician Assistant

## 2016-08-31 DIAGNOSIS — I10 Essential (primary) hypertension: Secondary | ICD-10-CM

## 2016-09-10 ENCOUNTER — Encounter: Payer: Self-pay | Admitting: Physician Assistant

## 2016-09-11 ENCOUNTER — Ambulatory Visit: Payer: Medicare Other | Admitting: Cardiology

## 2016-09-24 ENCOUNTER — Ambulatory Visit: Payer: Medicare Other | Admitting: Surgery

## 2016-09-24 ENCOUNTER — Encounter (HOSPITAL_COMMUNITY): Payer: Medicare Other

## 2016-09-25 ENCOUNTER — Encounter: Payer: Self-pay | Admitting: Surgery

## 2016-10-01 ENCOUNTER — Ambulatory Visit (HOSPITAL_COMMUNITY)
Admission: RE | Admit: 2016-10-01 | Discharge: 2016-10-01 | Disposition: A | Discharge status: Home / Self Care | Payer: Medicare Other | Source: Ambulatory Visit | Attending: Surgery | Admitting: Surgery

## 2016-10-01 ENCOUNTER — Ambulatory Visit (INDEPENDENT_AMBULATORY_CARE_PROVIDER_SITE_OTHER): Payer: Medicare Other | Admitting: Surgery

## 2016-10-01 ENCOUNTER — Encounter: Payer: Self-pay | Admitting: Surgery

## 2016-10-01 VITALS — BP 142/78 | HR 82 | Temp 97.3°F | Resp 16 | Ht 68.0 in | Wt 186.0 lb

## 2016-10-01 DIAGNOSIS — I6523 Occlusion and stenosis of bilateral carotid arteries: Secondary | ICD-10-CM | POA: Diagnosis present

## 2016-10-01 DIAGNOSIS — I6521 Occlusion and stenosis of right carotid artery: Secondary | ICD-10-CM

## 2016-10-01 LAB — VAS US CAROTID
LEFT ECA DIAS: -32 cm/s
Left CCA dist dias: 30 cm/s
Left CCA dist sys: 94 cm/s
Left CCA prox dias: 20 cm/s
Left CCA prox sys: 92 cm/s
Left ICA dist dias: -27 cm/s
Left ICA dist sys: -75 cm/s
Left ICA prox dias: 31 cm/s
Left ICA prox sys: 111 cm/s
RIGHT CCA MID DIAS: 24 cm/s
RIGHT ECA DIAS: -37 cm/s
Right CCA prox dias: 16 cm/s
Right CCA prox sys: 79 cm/s
Right cca dist sys: -91 cm/s

## 2016-10-01 NOTE — Progress Notes (Signed)
[facility] [place]  Patient name: Leonard Padilla MRN: [id number] DOB: 1946/02/19 Sex: male   REASON FOR VISIT:    Follow up [facility]  HISOTRY OF PRESENT ILLNESS:   Leonard Padilla is a 71 y.o. male who returns for follow-up.  He is status post right carotid endarterectomy with patch angioplasty on 12/15/2015 for symptomatic right carotid stenosis.  Intraoperative findings included a 70% stenosis.  The patient initially began having symptoms in February 2017 while visiting family in [place].  He had slurred speech and facial drooping.  Imaging revealed an acute ischemic infarct as well as intracranial and extracranial stenosis.  He also had left-sided symptoms.  His postoperative course was uncomplicated.  He reports no problems today.  He has had one bout of dizziness recently but this has resolved.  He has no neurologic symptoms.  PAST MEDICAL HISTORY:   Past Medical History:  Diagnosis Date  . Aortic stenosis    moderate AS 12/01/15 echo (Dr. Elberta Fortis)  . Arthritis   . Essential hypertension 10/13/2015  . Flu 09/2015   influenza A  . GERD (gastroesophageal reflux disease)    occ  . Heart murmur   . Hyperlipidemia   . Pneumonia 09/2015  . Psoriasis   . Sleep apnea    to pick cpap tomorrow 12/13/15  . Stroke [facility]) 09/2015     FAMILY HISTORY:   Family History  Problem Relation Age of Onset  . Cancer Mother     ovarian cancer  . Cancer Father     Prostate    SOCIAL HISTORY:   Social History  Substance Use Topics  . Smoking status: Former Smoker    Quit date: 08/07/1983  . Smokeless tobacco: Never Used  . Alcohol use Yes     Comment: 2 glasses wine daily quit for now     ALLERGIES:   No Known Allergies   CURRENT MEDICATIONS:   Current Outpatient Prescriptions  Medication Sig Dispense Refill  . atorvastatin (LIPITOR) 40 MG tablet Take 1 tablet (40 mg total) by mouth daily. 90 tablet 2  . clopidogrel (PLAVIX)  75 MG tablet Take 1 tablet (75 mg total) by mouth daily. 90 tablet 2  . hydrochlorothiazide (HYDRODIURIL) 25 MG tablet Take 1 tablet (25 mg total) by mouth daily. APPOINTMENT NEEDED FOR FURTHER REFILLS 90 tablet 2  . valsartan (DIOVAN) 40 MG tablet take 1 tablet by mouth once daily 90 tablet 1   No current facility-administered medications for this visit.     REVIEW OF SYSTEMS:   [X]  denotes positive finding, [ ]  denotes negative finding Cardiac  Comments:  Chest pain or chest pressure:    Shortness of breath upon exertion:    Short of breath when lying flat:    Irregular heart rhythm:        Vascular    Pain in calf, thigh, or hip brought on by ambulation:    Pain in feet at night that wakes you up from your sleep:     Blood clot in your veins:    Leg swelling:         Pulmonary    Oxygen at home:    Productive cough:     Wheezing:         Neurologic    Sudden weakness in arms or legs:     Sudden numbness in arms or legs:     Sudden onset of difficulty speaking or slurred speech:    Temporary loss  of vision in one eye:     Problems with dizziness:         Gastrointestinal    Blood in stool:     Vomited blood:         Genitourinary    Burning when urinating:     Blood in urine:        Psychiatric    Major depression:         Hematologic    Bleeding problems:    Problems with blood clotting too easily:        Skin    Rashes or ulcers:        Constitutional    Fever or chills:      PHYSICAL EXAM:   Vitals:   10/01/16 1426 10/01/16 1429  BP: (!) 142/88 (!) 142/78  Pulse: 82   Resp: 16   Temp: 97.3 F (36.3 C)   TempSrc: Oral   SpO2: 100%   Weight: 186 lb (84.4 kg)   Height: 5\' 8"  (1.727 m)     GENERAL: The patient is a well-nourished male, in no acute distress. The vital signs are documented above. CARDIAC: There is a regular rate and rhythm.  VASCULAR: Right carotid Incision has healed nicely. PULMONARY: Non-labored  respirations MUSCULOSKELETAL: There are no major deformities or cyanosis. NEUROLOGIC: No focal weakness or paresthesias are detected. SKIN: There are no ulcers or rashes noted. PSYCHIATRIC: The patient has a normal affect.  STUDIES:   I have reviewed his carotid ultrasound which shows no evidence of plaque in either internal carotid artery.   MEDICAL ISSUES:   Status post right carotid endarterectomy for symptomatic stenosis: The patient is doing very well with no residual neurologic symptoms.  Ultrasound today shows a widely patent right carotid artery and left carotid artery.  He will proceed with annual surveillance.    Durene CalWells Aayra Hornbaker, MD [facility] Tel [phone] Pager [phone]

## 2016-10-04 NOTE — Addendum Note (Signed)
Addended by: Burton ApleyPETTY, Kehaulani Fruin A on: 10/04/2016 09:22 AM   Modules accepted: Orders

## 2017-01-08 ENCOUNTER — Encounter: Payer: Self-pay | Admitting: Physician Assistant

## 2017-01-08 DIAGNOSIS — F482 Pseudobulbar affect: Secondary | ICD-10-CM

## 2017-01-08 HISTORY — DX: Pseudobulbar affect: F48.2

## 2017-01-08 MED ORDER — DEXTROMETHORPHAN-QUINIDINE 20-10 MG PO CAPS: 10.0000 mg | ORAL_CAPSULE | Freq: Every day | ORAL | 1 refills | Status: DC

## 2017-03-05 ENCOUNTER — Encounter: Payer: Self-pay | Admitting: Physician Assistant

## 2017-03-05 ENCOUNTER — Other Ambulatory Visit: Payer: Self-pay | Admitting: Physician Assistant

## 2017-03-05 MED ORDER — CANDESARTAN CILEXETIL 4 MG PO TABS: 4.0000 mg | ORAL_TABLET | Freq: Every day | ORAL | 1 refills | Status: DC

## 2017-03-18 ENCOUNTER — Encounter: Payer: Self-pay | Admitting: Physician Assistant

## 2017-03-18 ENCOUNTER — Ambulatory Visit (INDEPENDENT_AMBULATORY_CARE_PROVIDER_SITE_OTHER): Payer: Medicare Other | Admitting: Physician Assistant

## 2017-03-18 ENCOUNTER — Telehealth: Payer: Self-pay

## 2017-03-18 VITALS — BP 166/85 | HR 71 | Ht 68.0 in | Wt 184.0 lb

## 2017-03-18 DIAGNOSIS — I350 Nonrheumatic aortic (valve) stenosis: Secondary | ICD-10-CM | POA: Insufficient documentation

## 2017-03-18 DIAGNOSIS — I10 Essential (primary) hypertension: Secondary | ICD-10-CM | POA: Diagnosis not present

## 2017-03-18 DIAGNOSIS — E785 Hyperlipidemia, unspecified: Secondary | ICD-10-CM

## 2017-03-18 DIAGNOSIS — Z23 Encounter for immunization: Secondary | ICD-10-CM

## 2017-03-18 DIAGNOSIS — F341 Dysthymic disorder: Secondary | ICD-10-CM

## 2017-03-18 DIAGNOSIS — R454 Irritability and anger: Secondary | ICD-10-CM

## 2017-03-18 DIAGNOSIS — N529 Male erectile dysfunction, unspecified: Secondary | ICD-10-CM

## 2017-03-18 HISTORY — DX: Hyperlipidemia, unspecified: E78.5

## 2017-03-18 HISTORY — DX: Irritability and anger: R45.4

## 2017-03-18 HISTORY — DX: Dysthymic disorder: F34.1

## 2017-03-18 HISTORY — DX: Male erectile dysfunction, unspecified: N52.9

## 2017-03-18 MED ORDER — SERTRALINE HCL 25 MG PO TABS: 25.0000 mg | ORAL_TABLET | Freq: Every day | ORAL | 1 refills | Status: DC

## 2017-03-18 MED ORDER — TADALAFIL 5 MG PO TABS: 5.0000 mg | ORAL_TABLET | Freq: Every day | ORAL | 0 refills | Status: DC | PRN

## 2017-03-18 NOTE — Patient Instructions (Addendum)
For mood/irritability: - Sertraline 1 tab every morning or at bedtime - Prescription for exercise: brisk walk x 45 minutes, 3-4 days per week - Follow-up in 4 weeks    Tips for Managing Your Anger HOW CAN ANGER AFFECT MY LIFE? Everyone feels angry from time to time. It is okay and normal to feel angry. However, the way that you behave or react to anger can make it a problem. If you react too strongly to anger or you cannot control your anger, that can cause relationships problems at home and work. Anger can also affect your health. Uncontrolled anger increases your risk of heart disease. When you are angry, your heart rate and blood pressure rise. Levels of certain energy hormones, such as adrenaline, also increase. When this happens, your heart has to work harder. In extreme cases, anger can cause the blood vessels to become narrow. This reduces the supply of blood and oxygen to the heart, and that can trigger chest pain (angina). Anger can also trigger stress-related problems, such as:  Headaches.  Poor digestion.  Trouble sleeping (insomnia).  WHAT ACTIONS CAN I TAKE TO HELP MANAGE MY ANGER? You can take actions to help you manage your anger. For example:  Express your anger. When you express your anger in a healthy way, it is a form of communication. The following strategies can help you to express your anger in a healthy way and when you are ready to do so: ? Step away. When you are feeling reactive, it may take at least 20 minutes for your body to return to its normal blood pressure and heart rate. To help your body do this, take a walk, listen to music, stretch, take deep breaths, and avoid the situation or person who is making you angry. Try to only discuss your anger when you feel calm again. ? Try to consider how others feel before you react. Avoid swearing, sighing, raising your voice, or blaming. ? Choose a good time to work through problems. You may be more likely to lose your  temper at the end of the day when you are tired. ? Keep an anger journal. Writing down the situations that make you angry can help you figure out what triggers your anger and why.  Consider changing your perception. Is there another way you can view the situation that will leave you with a different emotion? Sometimes, changing the way you think about a situation can make it seem less infuriating. Here are some ways to do that: ? Remind yourself that everyone is not out to get you. ? Remind yourself that a disappointing result is not the end of the world. ? Take steps to solve or prevent the situation that upsets you. ? Find the humor in an aggravating situation. ? Deal with the physical effects by taking deep breaths, exercising, or taking a walk. ? Slowly repeat the word "relax" or another calming phrase. ? Picture a relaxing image in your mind. Close your eyes and use that image to help you calm yourself.  WHEN SHOULD I SEEK ADDITIONAL HELP? Anger becomes a problem if it occurs frequently and lasts for long periods of time. You may also need help managing your anger if:  You use physical force or aggression when you are angry and others feel threatened and fearful.  You feel that your anger is out of control.  Anger is interfering with your job.  Anger is causing problems with your health.  Anger is causing problems with  your relationships.  Anger is affecting your ability to tolerate normal daily situations, such as sitting in a traffic jam or waiting in line.  You treat others disrespectfully.  You do not trust people around you.  It may help to ask someone you trust whether he or she thinks you show any of these signs. Sometimes, it can be hard to recognize the problem yourself. WHERE CAN I GET SUPPORT? A psychologist or another licensed mental health professional can help you learn how to manage your anger. Ask your health care provider for a referral, or look online to find a  psychologist who specializes in anger management. You can search the websites of many mental health organizations to find a mental health care provider. Local Domestic Abuse Projects are also available for help. Your local hospital or behavioral counselors in your area may also offer anger management programs or support groups that can help. WHERE CAN I FIND MORE INFORMATION?  The U.S. Centers for Disease Control and Prevention: VisualTrips.huwww.cdc.gov/bam/life/getting-along3.html  American Psychological Association: https://www.jennings-kim.com/www.apa.org/topics/anger/  The Substance Abuse and Mental Health Services Administration: EpicRoom.plhttp://www.samhsa.gov/samhsaNewsLetter/Volume_22_Number_3/working_with_anger/  [employer] on Domestic Violence: ChurchReunion.co.ukhttp://www.nrcdv.org/dvam/  This information is not intended to replace advice given to you by your health care provider. Make sure you discuss any questions you have with your health care provider. Document Released: 05/20/2007 Document Revised: 03/25/2016 Document Reviewed: 05/27/2015 Elsevier Interactive Patient Education  2018 [employer]

## 2017-03-18 NOTE — Telephone Encounter (Signed)
-----   Message from [facility] LLCCharley Elizabeth Padilla, [place] sent at 03/18/2017 12:14 PM EDT ----- Please advise patient that his blood pressures have been elevated at his recent office visits. I would like him to increase his Candesartan to 2 tabs (8mg ) daily

## 2017-03-18 NOTE — Telephone Encounter (Signed)
Pt informed. Pt expressed understanding and is agreeable. Emmagrace Runkel CMA, RT 

## 2017-03-18 NOTE — Progress Notes (Signed)
HPI:                                                                Leonard Padilla is a 71 y.o. male who presents to [name] today for anger/irritability  Patient reports anhedonia, irritability and anger present for years that has been gradually worsening the last few months. States his wife is unhappy with his moods and asked for a divorce. Patient states he has lost interest in his typical activities and is unable to concentrate on Sudoku/puzzles tha the used to enjoy. Denies depressed mood, hopelessness, or sleep disturbance. Denies symptoms of mania/hypomania. Denies suicidal thinking. Denies auditory/visual hallucinations.  Patient also reports history of erectile dysfunction. He has been on Sildenafil in the past, but states he had visual changes where everything appeared tinted blue. He also states his wife reported worsening mood on Sildenafil. He also endorses low libido.   Past Medical History:  Diagnosis Date  . Aortic stenosis    moderate AS 12/01/15 echo (Dr. Elberta Fortis)  . Arthritis   . CKD stage G3a/A1, GFR 45-59 and albumin creatinine ratio <30 mg/g 08/04/2016  . Dysthymia 03/18/2017  . Essential hypertension 10/13/2015  . Flu 09/2015   influenza A  . GERD (gastroesophageal reflux disease)    occ  . Heart murmur   . Hyperlipidemia   . 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 Pneumonia 09/2015  . Postural dizziness with presyncope 08/27/2016  . Psoriasis   . Sleep apnea    to pick cpap tomorrow 12/13/15  . Stroke (HCC) 09/2015  . Symptomatic carotid artery stenosis 12/15/2015  . 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 ENDARTERECTOMY Right 12/15/2015   Procedure: RIGHT CAROTID ENDARTERECTOMY WITH PATCH ANGIOPLASTY;  Surgeon: Nada Libman, MD;  Location: [facility] OR;  Service: Vascular;  Laterality: Right;  . PATCH ANGIOPLASTY Right 12/15/2015   Procedure: RIGHT CAROTID PATCH  ANGIOPLASTY;  Surgeon: Nada Libman, MD;  Location: [facility] OR;  Service: Vascular;  Laterality: Right;   Social History  Substance Use Topics  . Smoking status: Former Smoker    Quit date: 08/07/1983  . Smokeless tobacco: Never Used  . Alcohol use Yes     Comment: 2 glasses wine daily quit for now   family history includes Cancer in his father and mother.  ROS: negative except as noted in the HPI  Medications: Current Outpatient Prescriptions  Medication Sig Dispense Refill  . atorvastatin (LIPITOR) 40 MG tablet Take 1 tablet (40 mg total) by mouth daily. 90 tablet 2  . candesartan (ATACAND) 4 MG tablet Take 1 tablet (4 mg total) by mouth daily. 90 tablet 1  . clopidogrel (PLAVIX) 75 MG tablet Take 1 tablet (75 mg total) by mouth daily. 90 tablet 2  . Dextromethorphan-Quinidine 20-10 MG CAPS Take 10 mg by mouth daily. 90 capsule 1  . hydrochlorothiazide (HYDRODIURIL) 25 MG tablet Take 1 tablet (25 mg total) by mouth daily. APPOINTMENT NEEDED FOR FURTHER REFILLS 90 tablet 2  . sertraline (ZOLOFT) 25 MG tablet Take 1 tablet (25 mg total) by mouth daily. 30 tablet 1  . tadalafil (CIALIS) 5 MG tablet Take 1 tablet (5 mg total) by mouth daily as needed  for erectile dysfunction. 10 tablet 0   No current facility-administered medications for this visit.    Allergies  Allergen Reactions  . Viagra [Sildenafil Citrate]     "blue sensation"       Objective:  BP (!) 166/85   Pulse 71   Ht 5\' 8"  (1.727 m)   Wt 184 lb (83.5 kg)   BMI 27.98 kg/m  Gen: not ill-appearing, no distress, appropriate for age HEENT: normal conjunctiva, trachea midline Pulm: Normal work of breathing, normal phonation Neuro: alert and oriented x 3, no tremor MSK: extremities atraumatic, normal gait and station Skin: no rashes on exposed skin, no cyanosis Psych: well-groomed, cooperative, good eye contact, euthymic mood, affect mood-congruent, normal speech and thought content   No results found for this or  any previous visit (from the past 72 hour(s)). No results found.  Depression screen [facility] 2/9 03/18/2017 08/03/2016 04/03/2016  Decreased Interest 3 0 0  Down, Depressed, Hopeless 0 0 0  PHQ - 2 Score 3 0 0  Altered sleeping 0 0 -  Tired, decreased energy 1 0 -  Change in appetite 0 1 -  Feeling bad or failure about yourself  0 0 -  Trouble concentrating 3 0 -  Moving slowly or fidgety/restless 0 0 -  Suicidal thoughts 0 0 -  PHQ-9 Score 7 1 -   GAD 7 : Generalized Anxiety Score 03/18/2017  Nervous, Anxious, on Edge 1  Control/stop worrying 0  Worry too much - different things 0  Trouble relaxing 0  Restless 0  Easily annoyed or irritable 3  Afraid - awful might happen 0  Total GAD 7 Score 4       Assessment and Plan: 71 y.o. male with   1. Dysthymia - PHQ9 score 7, anhedonia predominating, no SI/HI - starting low-dose sertraline - sertraline (ZOLOFT) 25 MG tablet; Take 1 tablet (25 mg total) by mouth daily.  Dispense: 30 tablet; Refill: 1  2. Irritability and anger - sertraline (ZOLOFT) 25 MG tablet; Take 1 tablet (25 mg total) by mouth daily.  Dispense: 30 tablet; Refill: 1  3. Erectile dysfunction, unspecified erectile dysfunction type - patient had visual changes with Sildenafil - tadalafil (CIALIS) 5 MG tablet; Take 1 tablet (5 mg total) by mouth daily as needed for erectile dysfunction.  Dispense: 10 tablet; Refill: 0  4. Elevated blood pressure reading with diagnosis of hypertension BP Readings from Last 3 Encounters:  03/18/17 (!) 166/85  10/01/16 (!) 142/78  08/27/16 124/79  - increasing Candesartan to 8mg  daily - history of CVA and CKD. Goal <130/80  5. Need for pneumococcal vaccination - patient received pneumovax over 1 year ago (12/2015) - given Prevnar today  Patient education and anticipatory guidance given Patient agrees with treatment plan Follow-up in 4 weeks or sooner as needed  Levonne Hubertharley E. Annabel Gibeau PA-C

## 2017-03-21 ENCOUNTER — Telehealth: Payer: Self-pay | Admitting: *Deleted

## 2017-03-21 NOTE — Telephone Encounter (Signed)
Submitted PA for cialis on 03/18/17 and received letter stating cialis has been denied under the patients medicatr part D benefit but his enhanced benefit allows coverage of this drug and has approved coverage for payment for the requested drug for one year. The insurance company has also sent a letter to the patient letting him know of this decision.letter sent to scan

## 2017-03-28 ENCOUNTER — Telehealth: Payer: Self-pay

## 2017-03-28 NOTE — Telephone Encounter (Signed)
-----   Message from [facility], [place] sent at 03/18/2017 12:14 PM EDT ----- Please advise patient that his blood pressures have been elevated at his recent office visits. I would like him to increase his Candesartan to 2 tabs (8mg ) daily

## 2017-03-28 NOTE — Telephone Encounter (Signed)
Pt notified -EH/RMA  

## 2017-03-28 NOTE — Telephone Encounter (Signed)
-----   Message from Charley Elizabeth Cummings, PA-C sent at 03/18/2017 12:14 PM EDT ----- Please advise patient that his blood pressures have been elevated at his recent office visits. I would like him to increase his Candesartan to 2 tabs (8mg) daily 

## 2017-04-10 ENCOUNTER — Encounter: Payer: Self-pay | Admitting: Physician Assistant

## 2017-04-10 DIAGNOSIS — I6521 Occlusion and stenosis of right carotid artery: Secondary | ICD-10-CM

## 2017-04-11 MED ORDER — ATORVASTATIN CALCIUM 40 MG PO TABS: 40.0000 mg | ORAL_TABLET | Freq: Every day | ORAL | 2 refills | Status: DC

## 2017-04-12 ENCOUNTER — Other Ambulatory Visit: Payer: Self-pay | Admitting: Physician Assistant

## 2017-04-12 DIAGNOSIS — R454 Irritability and anger: Secondary | ICD-10-CM

## 2017-04-12 DIAGNOSIS — F341 Dysthymic disorder: Secondary | ICD-10-CM

## 2017-04-19 ENCOUNTER — Ambulatory Visit: Payer: Self-pay | Admitting: Physician Assistant

## 2017-04-24 ENCOUNTER — Encounter: Payer: Self-pay | Admitting: Physician Assistant

## 2017-04-24 ENCOUNTER — Ambulatory Visit (INDEPENDENT_AMBULATORY_CARE_PROVIDER_SITE_OTHER): Payer: Medicare Other | Admitting: Physician Assistant

## 2017-04-24 VITALS — BP 128/75 | HR 69 | Wt 178.0 lb

## 2017-04-24 DIAGNOSIS — Z5181 Encounter for therapeutic drug level monitoring: Secondary | ICD-10-CM

## 2017-04-24 DIAGNOSIS — Z23 Encounter for immunization: Secondary | ICD-10-CM | POA: Diagnosis not present

## 2017-04-24 DIAGNOSIS — R399 Unspecified symptoms and signs involving the genitourinary system: Secondary | ICD-10-CM | POA: Diagnosis not present

## 2017-04-24 DIAGNOSIS — Z87891 Personal history of nicotine dependence: Secondary | ICD-10-CM | POA: Diagnosis not present

## 2017-04-24 DIAGNOSIS — F341 Dysthymic disorder: Secondary | ICD-10-CM

## 2017-04-24 DIAGNOSIS — Z79899 Other long term (current) drug therapy: Secondary | ICD-10-CM

## 2017-04-24 DIAGNOSIS — E785 Hyperlipidemia, unspecified: Secondary | ICD-10-CM | POA: Diagnosis not present

## 2017-04-24 DIAGNOSIS — I10 Essential (primary) hypertension: Secondary | ICD-10-CM | POA: Diagnosis not present

## 2017-04-24 DIAGNOSIS — Z130 Encounter for screening for diseases of the blood and blood-forming organs and certain disorders involving the immune mechanism: Secondary | ICD-10-CM

## 2017-04-24 DIAGNOSIS — R454 Irritability and anger: Secondary | ICD-10-CM

## 2017-04-24 DIAGNOSIS — Z136 Encounter for screening for cardiovascular disorders: Secondary | ICD-10-CM

## 2017-04-24 DIAGNOSIS — I6521 Occlusion and stenosis of right carotid artery: Secondary | ICD-10-CM

## 2017-04-24 LAB — PSA: PSA: 0.7 ng/mL (ref <=4.0)

## 2017-04-24 LAB — CBC
HCT: 38.9 % (ref 38.5–50.0)
Hemoglobin: 13.4 g/dL (ref 13.2–17.1)
MCH: 31.5 pg (ref 27.0–33.0)
MCHC: 34.4 g/dL (ref 32.0–36.0)
MCV: 91.5 fL (ref 80.0–100.0)
MPV: 9.9 fL (ref 7.5–12.5)
Platelets: 223 10*3/uL (ref 140–400)
RBC: 4.25 10*6/uL (ref 4.20–5.80)
RDW: 12.3 % (ref 11.0–15.0)
WBC: 5.5 10*3/uL (ref 3.8–10.8)

## 2017-04-24 LAB — LIPID PANEL W/REFLEX DIRECT LDL
Cholesterol: 190 mg/dL (ref <200)
HDL: 69 mg/dL (ref >40)
LDL Cholesterol (Calc): 97 mg/dL (calc)
Non-HDL Cholesterol (Calc): 121 mg/dL (calc) (ref <130)
Total CHOL/HDL Ratio: 2.8 (calc) (ref <5.0)
Triglycerides: 140 mg/dL (ref <150)

## 2017-04-24 LAB — COMPLETE METABOLIC PANEL WITH GFR
AG Ratio: 1.9 (calc) (ref 1.0–2.5)
ALT: 21 U/L (ref 9–46)
AST: 19 U/L (ref 10–35)
Albumin: 4.7 g/dL (ref 3.6–5.1)
Alkaline phosphatase (APISO): 60 U/L (ref 40–115)
BUN/Creatinine Ratio: 26 (calc) — ABNORMAL HIGH (ref 6–22)
BUN: 36 mg/dL — ABNORMAL HIGH (ref 7–25)
CO2: 24 mmol/L (ref 20–32)
Calcium: 9.6 mg/dL (ref 8.6–10.3)
Chloride: 102 mmol/L (ref 98–110)
Creat: 1.38 mg/dL — ABNORMAL HIGH (ref 0.70–1.18)
GFR, Est African American: 59 mL/min/{1.73_m2} — ABNORMAL LOW (ref >=60)
GFR, Est Non African American: 51 mL/min/{1.73_m2} — ABNORMAL LOW (ref >=60)
Globulin: 2.5 g/dL (calc) (ref 1.9–3.7)
Glucose, Bld: 106 mg/dL — ABNORMAL HIGH (ref 65–99)
Potassium: 4.2 mmol/L (ref 3.5–5.3)
Sodium: 135 mmol/L (ref 135–146)
Total Bilirubin: 0.9 mg/dL (ref 0.2–1.2)
Total Protein: 7.2 g/dL (ref 6.1–8.1)

## 2017-04-24 MED ORDER — HYDROCHLOROTHIAZIDE 25 MG PO TABS
25.0000 mg | ORAL_TABLET | Freq: Every day | ORAL | 1 refills | Status: DC
Start: 2017-04-24 — End: 2017-08-12

## 2017-04-24 MED ORDER — CANDESARTAN CILEXETIL 8 MG PO TABS: 8.0000 mg | ORAL_TABLET | Freq: Every day | ORAL | 1 refills | Status: DC

## 2017-04-24 MED ORDER — SERTRALINE HCL 25 MG PO TABS: ORAL_TABLET | ORAL | 1 refills | Status: DC

## 2017-04-24 NOTE — Patient Instructions (Addendum)
I have also ordered fasting labs. The lab is a walk-in open M-F 7:30a-4:30p (closed 12:30-1:30p). Nothing to eat or drink after midnight or at least 8 hours before your blood draw. You can have water and your medications.     Managing Your Hypertension Hypertension is commonly called high blood pressure. This is when the force of your blood pressing against the walls of your arteries is too strong. Arteries are blood vessels that carry blood from your heart throughout your body. Hypertension forces the heart to work harder to pump blood, and may cause the arteries to become narrow or stiff. Having untreated or uncontrolled hypertension can cause heart attack, stroke, kidney disease, and other problems. What are blood pressure readings? A blood pressure reading consists of a higher number over a lower number. Ideally, your blood pressure should be below 120/80. The first ("top") number is called the systolic pressure. It is a measure of the pressure in your arteries as your heart beats. The second ("bottom") number is called the diastolic pressure. It is a measure of the pressure in your arteries as the heart relaxes. What does my blood pressure reading mean? Blood pressure is classified into four stages. Based on your blood pressure reading, your health care provider may use the following stages to determine what type of treatment you need, if any. Systolic pressure and diastolic pressure are measured in a unit called mm Hg. Normal  Systolic pressure: below 120.  Diastolic pressure: below 80. Elevated  Systolic pressure: 120-129.  Diastolic pressure: below 80. Hypertension stage 1  Systolic pressure: 130-139.  Diastolic pressure: 80-89. Hypertension stage 2  Systolic pressure: 140 or above.  Diastolic pressure: 90 or above. What health risks are associated with hypertension? Managing your hypertension is an important responsibility. Uncontrolled hypertension can lead to:  A heart  attack.  A stroke.  A weakened blood vessel (aneurysm).  Heart failure.  Kidney damage.  Eye damage.  Metabolic syndrome.  Memory and concentration problems.  What changes can I make to manage my hypertension? Hypertension can be managed by making lifestyle changes and possibly by taking medicines. Your health care provider will help you make a plan to bring your blood pressure within a normal range. Eating and drinking  Eat a diet that is high in fiber and potassium, and low in salt (sodium), added sugar, and fat. An example eating plan is called the DASH (Dietary Approaches to Stop Hypertension) diet. To eat this way: ? Eat plenty of fresh fruits and vegetables. Try to fill half of your plate at each meal with fruits and vegetables. ? Eat whole grains, such as whole wheat pasta, brown rice, or whole grain bread. Fill about one quarter of your plate with whole grains. ? Eat low-fat diary products. ? Avoid fatty cuts of meat, processed or cured meats, and poultry with skin. Fill about one quarter of your plate with lean proteins such as fish, chicken without skin, beans, eggs, and tofu. ? Avoid premade and processed foods. These tend to be higher in sodium, added sugar, and fat.  Reduce your daily sodium intake. Most people with hypertension should eat less than 1,500 mg of sodium a day.  Limit alcohol intake to no more than 1 drink a day for nonpregnant women and 2 drinks a day for men. One drink equals 12 oz of beer, 5 oz of wine, or 1 oz of hard liquor. Lifestyle  Work with your health care provider to maintain a healthy body weight, or  to lose weight. Ask what an ideal weight is for you.  Get at least 30 minutes of exercise that causes your heart to beat faster (aerobic exercise) most days of the week. Activities may include walking, swimming, or biking.  Include exercise to strengthen your muscles (resistance exercise), such as weight lifting, as part of your weekly exercise  routine. Try to do these types of exercises for 30 minutes at least 3 days a week.  Do not use any products that contain nicotine or tobacco, such as cigarettes and e-cigarettes. If you need help quitting, ask your health care provider.  Control any long-term (chronic) conditions you have, such as high cholesterol or diabetes. Monitoring  Monitor your blood pressure at home as told by your health care provider. Your personal target blood pressure may vary depending on your medical conditions, your age, and other factors.  Have your blood pressure checked regularly, as often as told by your health care provider. Working with your health care provider  Review all the medicines you take with your health care provider because there may be side effects or interactions.  Talk with your health care provider about your diet, exercise habits, and other lifestyle factors that may be contributing to hypertension.  Visit your health care provider regularly. Your health care provider can help you create and adjust your plan for managing hypertension. Will I need medicine to control my blood pressure? Your health care provider may prescribe medicine if lifestyle changes are not enough to get your blood pressure under control, and if:  Your systolic blood pressure is 130 or higher.  Your diastolic blood pressure is 80 or higher.  Take medicines only as told by your health care provider. Follow the directions carefully. Blood pressure medicines must be taken as prescribed. The medicine does not work as well when you skip doses. Skipping doses also puts you at risk for problems. Contact a health care provider if:  You think you are having a reaction to medicines you have taken.  You have repeated (recurrent) headaches.  You feel dizzy.  You have swelling in your ankles.  You have trouble with your vision. Get help right away if:  You develop a severe headache or confusion.  You have unusual  weakness or numbness, or you feel faint.  You have severe pain in your chest or abdomen.  You vomit repeatedly.  You have trouble breathing. Summary  Hypertension is when the force of blood pumping through your arteries is too strong. If this condition is not controlled, it may put you at risk for serious complications.  Your personal target blood pressure may vary depending on your medical conditions, your age, and other factors. For most people, a normal blood pressure is less than 120/80.  Hypertension is managed by lifestyle changes, medicines, or both. Lifestyle changes include weight loss, eating a healthy, low-sodium diet, exercising more, and limiting alcohol. This information is not intended to replace advice given to you by your health care provider. Make sure you discuss any questions you have with your health care provider. Document Released: 04/16/2012 Document Revised: 06/20/2016 Document Reviewed: 06/20/2016 Elsevier Interactive Patient Education  [employer].

## 2017-04-24 NOTE — Progress Notes (Signed)
HPI:                                                                Leonard Padilla is a 71 y.o. male who presents to [name]: [facility] today for hypertension follow-up  HTN: taking Candesartan  and HCTZ  daily. Compliant with medications. Does not monitor BP's at home. Denies vision change, headache, chest pain with exertion, orthopnea, lightheadedness, syncope and edema. Risk factors include: male sex, HLD, hx of CVA, carotid stenosis  Depression/Anxiety: taking Zoloft without difficulty. Reports he has noticed an improvement in his irritability. States his wife has noticed too. Denies depressed mood or anhedonia. Denies symptoms of mania/hypomania. Denies suicidal thinking. Denies auditory/visual hallucinations.  Patient also is requesting a urology referral. He endorses incomplete bladder emptying, polyuria, hesitancy, and weak urinary stream. Symptoms present daily, worsening. Onset was approximately 3 months ago. Denies constitutional symptoms.  Past Medical History:  Diagnosis Date  . Aortic stenosis    moderate AS 12/01/15 echo (Dr. Elberta Fortis)  . Arthritis   . CKD stage G3a/A1, GFR 45-59 and albumin creatinine ratio <30 mg/g 08/04/2016  . Dysthymia 03/18/2017  . Essential hypertension 10/13/2015  . Flu 09/2015   influenza A  . GERD (gastroesophageal reflux disease)    occ  . Heart murmur   . Hyperlipidemia   . 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 Pneumonia 09/2015  . Postural dizziness with presyncope 08/27/2016  . Psoriasis   . Sleep apnea    to pick cpap tomorrow 12/13/15  . Stroke (HCC) 09/2015  . Symptomatic carotid artery stenosis 12/15/2015  . 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 ENDARTERECTOMY Right 12/15/2015   Procedure: RIGHT CAROTID ENDARTERECTOMY WITH PATCH ANGIOPLASTY;  Surgeon: Nada Libman, MD;  Location: [facility] OR;  Service: Vascular;  Laterality: Right;  .  PATCH ANGIOPLASTY Right 12/15/2015   Procedure: RIGHT CAROTID PATCH ANGIOPLASTY;  Surgeon: Nada Libman, MD;  Location: [facility] OR;  Service: Vascular;  Laterality: Right;   Social History  Substance Use Topics  . Smoking status: Former Smoker    Quit date: 08/07/1983  . Smokeless tobacco: Never Used  . Alcohol use Yes     Comment: 2 glasses wine daily quit for now   family history includes Cancer in his father and mother.  ROS: negative except as noted in the HPI  Medications: Current Outpatient Prescriptions  Medication Sig Dispense Refill  . atorvastatin (LIPITOR) 40 MG tablet Take 1 tablet (40 mg total) by mouth daily. 90 tablet 2  . candesartan (ATACAND) 4 MG tablet Take 1 tablet (4 mg total) by mouth daily. 90 tablet 1  . clopidogrel (PLAVIX) 75 MG tablet Take 1 tablet (75 mg total) by mouth daily. 90 tablet 2  . Dextromethorphan-Quinidine 20-10 MG CAPS Take 10 mg by mouth daily. 90 capsule 1  . hydrochlorothiazide (HYDRODIURIL) 25 MG tablet Take 1 tablet (25 mg total) by mouth daily. APPOINTMENT NEEDED FOR FURTHER REFILLS 90 tablet 2  . sertraline (ZOLOFT) 25 MG tablet 1 tab PO daily. NEEDS APPT FOR REFILLS 90 tablet 0  . tadalafil (CIALIS) 5 MG tablet Take 1 tablet (5 mg total) by mouth daily as needed for erectile dysfunction. 10  tablet 0   No current facility-administered medications for this visit.    Allergies  Allergen Reactions  . Viagra [Sildenafil Citrate]     "blue sensation"       Objective:  BP 128/75   Pulse 69   Wt 178 lb (80.7 kg)   BMI 27.06 kg/m  Gen:  alert, not ill-appearing, no distress, appropriate for age HEENT: head normocephalic without obvious abnormality, conjunctiva and cornea clear, trachea midline Pulm: Normal work of breathing, normal phonation, clear to auscultation bilaterally, no wheezes, rales or rhonchi CV: Normal rate, regular rhythm, s1 and s2 distinct, no murmurs, clicks or rubs  Neuro: alert and oriented x 3, no tremor MSK:  extremities atraumatic, normal gait and station Skin: intact, no rashes on exposed skin, no jaundice, no cyanosis Psych: well-groomed, cooperative, good eye contact, euthymic mood, affect mood-congruent, speech is articulate, and thought processes clear and goal-directed    No results found for this or any previous visit (from the past 72 hour(s)). No results found.  Depression screen [facility] 2/9 04/24/2017 03/18/2017 08/03/2016 04/03/2016  Decreased Interest 0 3 0 0  Down, Depressed, Hopeless 0 0 0 0  PHQ - 2 Score 0 3 0 0  Altered sleeping 0 0 0 -  Tired, decreased energy 1 1 0 -  Change in appetite 0 0 1 -  Feeling bad or failure about yourself  0 0 0 -  Trouble concentrating 0 3 0 -  Moving slowly or fidgety/restless 0 0 0 -  Suicidal thoughts 0 0 0 -  PHQ-9 Score -   GAD 7 : Generalized Anxiety Score 04/29/2017 03/18/2017  Nervous, Anxious, on Edge 0 1  Control/stop worrying 0 0  Worry too much - different things 0 0  Trouble relaxing 0 0  Restless 0 0  Easily annoyed or irritable 1 3  Afraid - awful might happen 0 0  Total GAD 7 Score 1 4      Assessment and Plan: 71 y.o. male with   1. Needs flu shot - Flu vaccine HIGH DOSE PF (Fluzone High dose)  2. Dysthymia - sertraline (ZOLOFT) 25 MG tablet; 1 tab PO daily. NEEDS APPT FOR REFILLS  Dispense: 90 tablet; Refill: 1  3. Irritability and anger - PHQ2 and GAD7 negative - sertraline (ZOLOFT) 25 MG tablet; 1 tab PO daily. NEEDS APPT FOR REFILLS  Dispense: 90 tablet; Refill: 1  4. Essential hypertension BP Readings from Last 3 Encounters:  04/24/17 128/75  03/18/17 (!) 166/85  10/01/16 (!) 142/78  - BP in range today. Continue daily medications - therapeutic lifestyle changes - fasting lipid panel and metabolic panel - candesartan (ATACAND) 8 MG tablet; Take 1 tablet (8 mg total) by mouth daily.  Dispense: 90 tablet; Refill: 1 - COMPLETE METABOLIC PANEL WITH GFR - hydrochlorothiazide (HYDRODIURIL) 25 MG tablet;  Take 1 tablet (25 mg total) by mouth daily.  Dispense: 90 tablet; Refill: 1  5. Lower urinary tract symptoms (LUTS) - AUASS 18, mostly dissatisfied - Ambulatory referral to Urology - PSA  6. Encounter for monitoring statin therapy - Lipid Panel w/reflex Direct LDL  7. Screening for blood disease - CBC  8. Dyslipidemia, goal LDL below 70   9. Encounter for abdominal aortic aneurysm (AAA) screening - VAS [place] AAA DUPLEX; Future  10. Former smoker, stopped smoking in distant past      Patient education and anticipatory guidance given Patient agrees with treatment plan Follow-up as needed if symptoms worsen or  fail to improve  Darlyne Russian PA-C

## 2017-04-26 ENCOUNTER — Encounter: Payer: Self-pay | Admitting: Physician Assistant

## 2017-04-26 ENCOUNTER — Other Ambulatory Visit: Payer: Self-pay

## 2017-04-26 DIAGNOSIS — I6521 Occlusion and stenosis of right carotid artery: Secondary | ICD-10-CM

## 2017-04-26 MED ORDER — ATORVASTATIN CALCIUM 40 MG PO TABS: 80.0000 mg | ORAL_TABLET | Freq: Every day | ORAL | 0 refills | Status: DC

## 2017-04-26 NOTE — Progress Notes (Signed)
Good morning Leonard Padilla,  Your labs look good overall 1. Prostate specific antigen is normal 2. Chronic kidney disease is stable 3. Normal blood counts 4. Your LDL cholesterol is 97. Ideally, it should be less than 70 to prevent future strokes/heart attacks. If you can tolerate it, I would increase your Atorvastatin to 80 mg nightly. At the same time, work on low-cholesterol diet.  Let me know if you have any questions or concerns  Best, Vinetta Bergamo

## 2017-04-29 ENCOUNTER — Other Ambulatory Visit: Payer: Self-pay | Admitting: Physician Assistant

## 2017-04-29 DIAGNOSIS — R399 Unspecified symptoms and signs involving the genitourinary system: Secondary | ICD-10-CM | POA: Insufficient documentation

## 2017-04-29 HISTORY — DX: Unspecified symptoms and signs involving the genitourinary system: R39.9

## 2017-04-30 ENCOUNTER — Other Ambulatory Visit: Payer: Self-pay | Admitting: Physician Assistant

## 2017-04-30 DIAGNOSIS — Z136 Encounter for screening for cardiovascular disorders: Secondary | ICD-10-CM

## 2017-05-06 ENCOUNTER — Telehealth: Payer: Self-pay | Admitting: *Deleted

## 2017-05-06 NOTE — Telephone Encounter (Signed)
Pre Authorization sent to cover my meds. [id number] Outcome  Approvedtoday  CaseId:[id number];Status:Approved;Review Type:Qty;Coverage Start Date:04/06/2017;Coverage End Date:05/05/2020;  faxed approval to pharm and ask that they notify patient

## 2017-05-07 ENCOUNTER — Encounter: Payer: Self-pay | Admitting: Physician Assistant

## 2017-05-07 ENCOUNTER — Ambulatory Visit (HOSPITAL_BASED_OUTPATIENT_CLINIC_OR_DEPARTMENT_OTHER): Payer: Medicare Other

## 2017-05-07 ENCOUNTER — Ambulatory Visit (HOSPITAL_BASED_OUTPATIENT_CLINIC_OR_DEPARTMENT_OTHER)
Admission: RE | Admit: 2017-05-07 | Discharge: 2017-05-07 | Disposition: A | Discharge status: Home / Self Care | Payer: Medicare Other | Source: Ambulatory Visit | Attending: Physician Assistant | Admitting: Physician Assistant

## 2017-05-07 DIAGNOSIS — Z136 Encounter for screening for cardiovascular disorders: Secondary | ICD-10-CM | POA: Diagnosis present

## 2017-05-07 DIAGNOSIS — I77811 Abdominal aortic ectasia: Secondary | ICD-10-CM | POA: Insufficient documentation

## 2017-05-07 HISTORY — DX: Abdominal aortic ectasia: I77.811

## 2017-05-07 NOTE — Progress Notes (Signed)
Hi Leonard Padilla,  Your ultrasound did not show any aneurysm. There is still mild enlargement of your aorta, but this is stable. Recommend repeat ultrasound in 5 years.  Best, Vinetta Bergamo

## 2017-05-22 ENCOUNTER — Encounter: Payer: Self-pay | Admitting: Physician Assistant

## 2017-08-03 ENCOUNTER — Other Ambulatory Visit: Payer: Self-pay | Admitting: Family Medicine

## 2017-08-03 DIAGNOSIS — I6521 Occlusion and stenosis of right carotid artery: Secondary | ICD-10-CM

## 2017-08-08 ENCOUNTER — Telehealth: Payer: Self-pay | Admitting: Physician Assistant

## 2017-08-08 NOTE — Telephone Encounter (Signed)
Attempted to contact Pt to triage his upcoming MyChart appointment. It states he is being seen for "Experience shortness of breath and chest pains with mild exercise"   No answer, left VM to return clinic call.

## 2017-08-09 NOTE — Telephone Encounter (Signed)
Patient refuses going to ER or urgent care for chest pain. States he only gets a mild chest pain with exertion and is only willing to wait and see PCP. Advise strongly he needed to seek medical Emergency due to sx and to call 9-1-1 if he starts to experience extreme discomfort, pain or SOB.

## 2017-08-09 NOTE — Telephone Encounter (Signed)
Patient called stating that he is doing okay just experience SOB and chest pain during physical activities. Patient state he is able to wait to his appt date 08/12/2016 and sees no urgency to be seen sooner.

## 2017-08-09 NOTE — Telephone Encounter (Signed)
Please contact patient Leonard Padilla should be seen in the emergency room as soon as possible Chest pain with exertion is a concerning symptom and requires an urgent work-up  Waiting until the 7th is a bad idea and I strongly advise against it

## 2017-08-12 ENCOUNTER — Ambulatory Visit: Payer: Medicare Other | Admitting: Physician Assistant

## 2017-08-12 ENCOUNTER — Ambulatory Visit (INDEPENDENT_AMBULATORY_CARE_PROVIDER_SITE_OTHER): Payer: Medicare Other

## 2017-08-12 ENCOUNTER — Encounter: Payer: Self-pay | Admitting: Physician Assistant

## 2017-08-12 VITALS — BP 98/61 | HR 86 | Ht 68.0 in | Wt 184.9 lb

## 2017-08-12 DIAGNOSIS — I10 Essential (primary) hypertension: Secondary | ICD-10-CM | POA: Diagnosis not present

## 2017-08-12 DIAGNOSIS — R079 Chest pain, unspecified: Secondary | ICD-10-CM

## 2017-08-12 DIAGNOSIS — R9431 Abnormal electrocardiogram [ECG] [EKG]: Secondary | ICD-10-CM | POA: Diagnosis not present

## 2017-08-12 DIAGNOSIS — I7090 Unspecified atherosclerosis: Secondary | ICD-10-CM | POA: Diagnosis not present

## 2017-08-12 DIAGNOSIS — R011 Cardiac murmur, unspecified: Secondary | ICD-10-CM

## 2017-08-12 DIAGNOSIS — I5189 Other ill-defined heart diseases: Secondary | ICD-10-CM

## 2017-08-12 MED ORDER — NITROGLYCERIN 0.4 MG SL SUBL: 0.4000 mg | SUBLINGUAL_TABLET | SUBLINGUAL | 0 refills | Status: DC | PRN

## 2017-08-12 MED ORDER — HYDROCHLOROTHIAZIDE 12.5 MG PO TABS: 12.5000 mg | ORAL_TABLET | Freq: Every day | ORAL | 0 refills | Status: DC

## 2017-08-12 NOTE — Progress Notes (Signed)
HPI:                                                                Leonard Padilla is a 72 y.o. male who presents to [name]: Primary Care Sports Medicine today for chest pain and dyspnea  Chest Pain   This is a recurrent problem. The current episode started more than 1 month ago (x October). The onset quality is undetermined. The problem occurs every several days. The problem has been unchanged. The pain is present in the substernal region. The quality of the pain is described as sharp. The pain does not radiate. Associated symptoms include exertional chest pressure and shortness of breath. Pertinent negatives include no hemoptysis, irregular heartbeat, near-syncope or palpitations. The pain is aggravated by walking.  Pertinent negatives for family medical history include: no early MI. Prior diagnostic workup includes exercise treadmill test (years ago).  Patient has known atherosclerotic disease of the carotid arteries, s/p endarterectomy. He is on Plavix and high-intensity Atorvastatin.   Past Medical History:  Diagnosis Date  . Aortic stenosis    moderate AS 12/01/15 echo (Dr. Elberta Fortis)  . Arthritis   . CKD (chronic kidney disease) stage 3, GFR 30-59 ml/min (HCC) 08/04/2016  . CKD stage G3a/A1, GFR 45-59 and albumin creatinine ratio <30 mg/g (HCC) 08/04/2016  . Dysthymia 03/18/2017  . 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 Essential hypertension 10/13/2015  . Flu 09/2015   influenza A  . Former smoker, stopped smoking in distant past   . GERD (gastroesophageal reflux disease)    occ  . Heart murmur   . History of colon polyps   . Hyperlipidemia   . 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 Pneumonia 09/2015  . Postural dizziness with presyncope 08/27/2016  . Psoriasis   . Sleep apnea    to pick cpap tomorrow 12/13/15  . Stroke (HCC) 09/2015  . Symptomatic carotid artery stenosis 12/15/2015  . 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 ENDARTERECTOMY Right 12/15/2015   Procedure: RIGHT CAROTID ENDARTERECTOMY WITH PATCH ANGIOPLASTY;  Surgeon: Nada Libman, MD;  Location: [facility] OR;  Service: Vascular;  Laterality: Right;  . PATCH ANGIOPLASTY Right 12/15/2015   Procedure: RIGHT CAROTID PATCH ANGIOPLASTY;  Surgeon: Nada Libman, MD;  Location: [facility] OR;  Service: Vascular;  Laterality: Right;   Social History   Tobacco Use  . Smoking status: Former Smoker    Last attempt to quit: 08/07/1983    Years since quitting: 34.0  . Smokeless tobacco: Never Used  Substance Use Topics  . Alcohol use: Yes    Comment: 2 glasses wine daily quit for now   family history includes Cancer in his father and mother.  ROS: negative except as noted in the HPI  Medications: Current Outpatient Medications  Medication Sig Dispense Refill  . atorvastatin (LIPITOR) 40 MG tablet TAKE 2 TABLETS(80 MG) BY MOUTH DAILY 180 tablet 0  . candesartan (ATACAND) 8 MG tablet Take 1 tablet (8 mg total) by mouth daily. 90 tablet 1  . clopidogrel (PLAVIX) 75 MG tablet Take 1 tablet (75 mg total) by mouth daily. 90 tablet 2  . Dextromethorphan-Quinidine 20-10 MG CAPS  Take 10 mg by mouth daily. 90 capsule 1  . hydrochlorothiazide (HYDRODIURIL) 25 MG tablet Take 1 tablet (25 mg total) by mouth daily. 90 tablet 1  . sertraline (ZOLOFT) 25 MG tablet 1 tab PO daily. NEEDS APPT FOR REFILLS 90 tablet 1  . tadalafil (CIALIS) 5 MG tablet Take 1 tablet (5 mg total) by mouth daily as needed for erectile dysfunction. 10 tablet 0  . tamsulosin (FLOMAX) 0.4 MG CAPS capsule TK 1 C PO QD  3   No current facility-administered medications for this visit.    Allergies  Allergen Reactions  . Viagra [Sildenafil Citrate]     "blue sensation"       Objective:  BP 98/61 (BP Location: Left Arm, Patient Position: Sitting, Cuff Size: Normal)   Pulse 86   Ht 5\' 8"  (1.727 m)   Wt 184 lb 14.4 oz (83.9 kg)   BMI  28.11 kg/m  Gen:  alert, not ill-appearing, no distress, appropriate for age HEENT: head normocephalic without obvious abnormality, conjunctiva and cornea clear, trachea midline Pulm: Normal work of breathing, normal phonation, clear to auscultation bilaterally, no wheezes, rales or rhonchi CV: Normal rate, regular rhythm, s1 and s2 distinct, grade 3/6 aortic systolic murmur, no clicks or rubs  Neuro: alert and oriented x 3, no tremor MSK: extremities atraumatic, normal gait and station Skin: intact, no rashes on exposed skin, no jaundice, no cyanosis Psych: well-groomed, cooperative, good eye contact, euthymic mood, affect mood-congruent, speech is articulate, and thought processes clear and goal-directed  ECG 08/12/2017 13:36 Vent rate 64 bpm PR-I 168 ms QRS 86 ms QT/QTc 428/441 ms Normal sinus rhythm   No results found for this or any previous visit (from the past 72 hour(s)). No results found.    Assessment and Plan: 72 y.o. male with   1. Chest pain on exertion - patient with known atherosclerotic disease on Plavix, presents with 3 months of substernal chest pain and dyspnea with minimal exertion. Unable to complete 4 METS of activity without symptoms. High likelihood of coronary artery disease. Angina is stable; resolves with rest. Referring to Cardiology for stress test or cardiac cath - ECG today did not show ST abnormalities - checking labs and chest x-ray today - stopping Cialis. Patient given instruction on Nitro prn and when to go to the ER  - DG Chest 2 View - Ambulatory referral to Cardiology - CBC with Differential/Platelet - Comprehensive metabolic panel - CK total and CKMB (cardiac)[facility] - nitroGLYCERIN (NITROSTAT) 0.4 MG SL tablet; Place 1 tablet (0.4 mg total) under the tongue every 5 (five) minutes as needed for chest pain.  Dispense: 20 tablet; Refill: 0  2. Atherosclerosis - continue Atorvastatin 80 mg and Plavix  3. Hypertension goal BP (blood  pressure) < 140/80 BP Readings from Last 3 Encounters:  08/12/17 98/61  04/24/17 128/75  03/18/17 (!) 166/85  - reducing thiazide from 25 mg to 12.5 mg - hydrochlorothiazide (HYDRODIURIL) 12.5 MG tablet; Take 1 tablet (12.5 mg total) by mouth daily.  Dispense: 90 tablet; Refill: 0  4. Abnormal resting ECG findings - borderline QTc of 441 - Q waves in II, III, and aVF  5. Systolic murmur - moderate aortic stenosis on echo from 11/28/2015, normal EF 55-60%, no wall motion abnormalities, grade 1 diastolic dysfunction  Patient education and anticipatory guidance given Patient agrees with treatment plan Follow-up as needed if symptoms worsen or fail to improve  Levonne Hubertharley E. Cummings PA-C

## 2017-08-12 NOTE — Patient Instructions (Addendum)
Go downstairs for labs and Chest x-ray today  - STOP Cialis, no sexual activity for now - For chest pain, nitro dissolved under your tongue every 5 minutes as needed for chest pain. After 3 doses, to the Emergency Room May cause a terrible headache  For your blood pressure: - BP goal <140/80 - stop Hydrochlorothiazide 25 mg, reduce dose to 12.5 mg daily. Continue your candesartan - Monitor your blood pressure at home - Return if BP <90/60 or >150/90 - Check around the same time each day in a relaxed setting - Limit salt to <2000 mg/day - Follow DASH eating plan - limit alcohol to 2 standard drinks per day - avoid tobacco products - weight loss: 7% of current body weight  Coronary Artery Disease, Male Coronary artery disease (CAD) is a condition in which the arteries that lead to the heart (coronary arteries) become narrow or blocked. The narrowing or blockage can lead to decreased blood flow to the heart. Prolonged reduced blood flow can cause a heart attack (myocardial infarction or MI). This condition may also be called coronary heart disease. Because CAD is the leading cause of death in men, it is important to understand what causes this condition and how it is treated. What are the causes? CAD is most often caused by atherosclerosis. This is the buildup of fat and cholesterol (plaque) on the inside of the arteries. Over time, the plaque may narrow or block the artery, reducing blood flow to the heart. Plaque can also become weak and break off within a coronary artery and cause a sudden blockage. Other less common causes of CAD include:  An embolism or blood clot in a coronary artery.  A tearing of the artery (spontaneous coronary artery dissection).  An aneurysm.  Inflammation (vasculitis) in the artery wall.  What increases the risk? The following factors may make you more likely to develop this condition:  Age. Men over age 26 are at a greater risk of CAD.  Family history  of CAD.  Gender. Men often develop CAD earlier in life than women.  High blood pressure (hypertension).  Diabetes.  High cholesterol levels.  Tobacco use.  Excessive alcohol use.  Lack of exercise.  A diet high in saturated and trans fats, such as fried food and processed meat.  Other possible risk factors include:  High stress levels.  Depression.  Obesity.  Sleep apnea.  What are the signs or symptoms? Many people do not have any symptoms during the early stages of CAD. As the condition progresses, symptoms may include:  Chest pain (angina). The pain can: ? Feel like a crushing or squeezing, or a tightness, pressure, fullness, or heaviness in the chest. ? Last more than a few minutes or can stop and recur. The pain tends to get worse with exercise or stress and to fade with rest.  Pain in the arms, neck, jaw, or back.  Unexplained heartburn or indigestion.  Shortness of breath.  Nausea or vomiting.  Sudden light-headedness.  Sudden cold sweats.  Fluttering or fast heartbeat (palpitations).  How is this diagnosed? This condition is diagnosed based on:  Your family and medical history.  A physical exam.  Tests, including: ? A test to check the electrical signals in your heart (electrocardiogram). ? Exercise stress test. This looks for signs of blockage when the heart is stressed with exercise, such as running on a treadmill. ? Pharmacologic stress test. This test looks for signs of blockage when the heart is being stressed  with a medicine. ? Blood tests. ? Coronary angiogram. This is a procedure to look at the coronary arteries to see if there is any blockage. During this test, a dye is injected into your arteries so they appear on an X-ray. ? A test that uses sound waves to take a picture of your heart (echocardiogram). ? Chest X-ray.  How is this treated? This condition may be treated by:  Healthy lifestyle changes to reduce risk  factors.  Medicines such as: ? Antiplatelet medicines and blood-thinning medicines, such as aspirin. These help to prevent blood clots. ? Nitroglycerin. ? Blood pressure medicines. ? Cholesterol-lowering medicine.  Coronary angioplasty and stenting. During this procedure, a thin, flexible tube is inserted through a blood vessel and into a blocked artery. A balloon or similar device on the end of the tube is inflated to open up the artery. In some cases, a small, mesh tube (stent) is inserted into the artery to keep it open.  Coronary artery bypass surgery. During this surgery, veins or arteries from other parts of the body are used to create a bypass around the blockage and allow blood to reach your heart.  Follow these instructions at home: Medicines  Take over-the-counter and prescription medicines only as told by your health care provider.  Do not take the following medicines unless your health care provider approves: ? NSAIDs, such as ibuprofen, naproxen, or celecoxib. ? Vitamin supplements that contain vitamin A, vitamin E, or both. Lifestyle  Follow an exercise program approved by your health care provider. Aim for 150 minutes of moderate exercise or 75 minutes of vigorous exercise each week.  Maintain a healthy weight or lose weight as approved by your health care provider.  Rest when you are tired.  Learn to manage stress or try to limit your stress. Ask your health care provider for suggestions if you need help.  Get screened for depression and seek treatment, if needed.  Do not use any products that contain nicotine or tobacco, such as cigarettes and e-cigarettes. If you need help quitting, ask your health care provider.  Do not use illegal drugs. Eating and drinking  Follow a heart-healthy diet. A dietitian can help educate you about healthy food options and changes. In general, eat plenty of fruits and vegetables, lean meats, and whole grains.  Avoid foods high  in: ? Sugar. ? Salt (sodium). ? Saturated fat, such as processed or fatty meat. ? Trans fat, such as fried foods.  Use healthy cooking methods such as roasting, grilling, broiling, baking, poaching, steaming, or stir-frying.  If you drink alcohol, and your health care provider approves, limit your alcohol intake to no more than 2 drinks per day. One drink equals 12 ounces of beer, 5 ounces of wine, or 1 ounces of hard liquor. General instructions  Manage any other health conditions, such as hypertension and diabetes. These conditions affect your heart.  Your health care provider may ask you to monitor your blood pressure. Ideally, your blood pressure should be below 130/80.  Keep all follow-up visits as told by your health care provider. This is important. Get help right away if:  You have pain in your chest, neck, arm, jaw, stomach, or back that: ? Lasts more than a few minutes. ? Is recurring. ? Is not relieved by taking medicine under your tongue (sublingualnitroglycerin).  You have too much (profuse) sweating without cause.  You have unexplained: ? Heartburn or indigestion. ? Shortness of breath or difficulty breathing. ? Fluttering or  fast heartbeat (palpitations). ? Nausea or vomiting. ? Fatigue. ? Feelings of nervousness or anxiety. ? Weakness. ? Diarrhea.  You have sudden light-headedness or dizziness.  You faint.  You feel like hurting yourself or think about taking your own life. These symptoms may represent a serious problem that is an emergency. Do not wait to see if the symptoms will go away. Get medical help right away. Call your local emergency services (911 in the [place] Do not drive yourself to the hospital. Summary  Coronary artery disease (CAD) is a process in which the arteries that lead to the heart (coronary arteries) become narrow or blocked. The narrowing or blockage can lead to a heart attack.  Many people do not have any symptoms during the  early stages of CAD. This is called "silent CAD."  CAD can be treated with lifestyle changes, medicines, surgery, or a combination of these treatments. This information is not intended to replace advice given to you by your health care provider. Make sure you discuss any questions you have with your health care provider. Document Released: 02/17/2014 Document Revised: 07/13/2016 Document Reviewed: 07/13/2016 Elsevier Interactive Patient Education  [employer]2018 Elsevier Inc.

## 2017-08-13 LAB — COMPREHENSIVE METABOLIC PANEL
AG Ratio: 1.6 (calc) (ref 1.0–2.5)
ALT: 24 U/L (ref 9–46)
AST: 21 U/L (ref 10–35)
Albumin: 4.5 g/dL (ref 3.6–5.1)
Alkaline phosphatase (APISO): 74 U/L (ref 40–115)
BUN/Creatinine Ratio: 22 (calc) (ref 6–22)
BUN: 33 mg/dL — ABNORMAL HIGH (ref 7–25)
CO2: 28 mmol/L (ref 20–32)
Calcium: 9.9 mg/dL (ref 8.6–10.3)
Chloride: 99 mmol/L (ref 98–110)
Creat: 1.48 mg/dL — ABNORMAL HIGH (ref 0.70–1.18)
Globulin: 2.8 g/dL (calc) (ref 1.9–3.7)
Glucose, Bld: 94 mg/dL (ref 65–99)
Potassium: 4.9 mmol/L (ref 3.5–5.3)
Sodium: 136 mmol/L (ref 135–146)
Total Bilirubin: 0.5 mg/dL (ref 0.2–1.2)
Total Protein: 7.3 g/dL (ref 6.1–8.1)

## 2017-08-13 LAB — CBC WITH DIFFERENTIAL/PLATELET
Basophils Absolute: 63 cells/uL (ref 0–200)
Basophils Relative: 0.9 %
Eosinophils Absolute: 238 cells/uL (ref 15–500)
Eosinophils Relative: 3.4 %
HCT: 39.2 % (ref 38.5–50.0)
Hemoglobin: 13.5 g/dL (ref 13.2–17.1)
Lymphs Abs: 1701 cells/uL (ref 850–3900)
MCH: 31.3 pg (ref 27.0–33.0)
MCHC: 34.4 g/dL (ref 32.0–36.0)
MCV: 91 fL (ref 80.0–100.0)
MPV: 9.7 fL (ref 7.5–12.5)
Monocytes Relative: 7 %
Neutro Abs: 4508 cells/uL (ref 1500–7800)
Neutrophils Relative %: 64.4 %
Platelets: 262 10*3/uL (ref 140–400)
RBC: 4.31 10*6/uL (ref 4.20–5.80)
RDW: 11.5 % (ref 11.0–15.0)
Total Lymphocyte: 24.3 %
WBC mixed population: 490 cells/uL (ref 200–950)
WBC: 7 10*3/uL (ref 3.8–10.8)

## 2017-08-13 LAB — CK TOTAL AND CKMB (NOT AT ARMC)
CK, MB: 2.3 ng/mL (ref 0–5.0)
Relative Index: 1.5 (ref 0–4.0)
Total CK: 154 U/L (ref 44–196)

## 2017-08-13 NOTE — Progress Notes (Signed)
Good afternoon Leonard Padilla,  Your labs look good. Your chronic kidney disease is stable. Recommend we monitor this every 6 months.  Your chest x-ray was normal.  Treatment plan does not change. I still think your chest pain is heart-related and you need a stress test or a heart cath. I placed an urgent referral to cardiology for you and you should be hearing from them this week.  Best, Vinetta Bergamoharley

## 2017-08-14 ENCOUNTER — Other Ambulatory Visit: Payer: Self-pay | Admitting: Physician Assistant

## 2017-08-19 ENCOUNTER — Encounter: Payer: Self-pay | Admitting: Physician Assistant

## 2017-08-19 DIAGNOSIS — I5189 Other ill-defined heart diseases: Secondary | ICD-10-CM

## 2017-08-19 DIAGNOSIS — I7090 Unspecified atherosclerosis: Secondary | ICD-10-CM

## 2017-08-19 DIAGNOSIS — R011 Cardiac murmur, unspecified: Secondary | ICD-10-CM | POA: Insufficient documentation

## 2017-08-19 HISTORY — DX: Other ill-defined heart diseases: I51.89

## 2017-08-19 HISTORY — DX: Unspecified atherosclerosis: I70.90

## 2017-08-22 ENCOUNTER — Other Ambulatory Visit: Payer: Self-pay

## 2017-08-22 ENCOUNTER — Ambulatory Visit: Payer: Medicare Other | Admitting: Cardiology

## 2017-08-22 ENCOUNTER — Encounter: Payer: Self-pay | Admitting: Cardiology

## 2017-08-22 VITALS — BP 122/64 | HR 80 | Ht 68.0 in | Wt 189.0 lb

## 2017-08-22 DIAGNOSIS — I77811 Abdominal aortic ectasia: Secondary | ICD-10-CM

## 2017-08-22 DIAGNOSIS — E785 Hyperlipidemia, unspecified: Secondary | ICD-10-CM | POA: Diagnosis not present

## 2017-08-22 DIAGNOSIS — Z9889 Other specified postprocedural states: Secondary | ICD-10-CM

## 2017-08-22 DIAGNOSIS — I209 Angina pectoris, unspecified: Secondary | ICD-10-CM

## 2017-08-22 DIAGNOSIS — R079 Chest pain, unspecified: Secondary | ICD-10-CM | POA: Diagnosis not present

## 2017-08-22 DIAGNOSIS — I10 Essential (primary) hypertension: Secondary | ICD-10-CM

## 2017-08-22 DIAGNOSIS — Z8673 Personal history of transient ischemic attack (TIA), and cerebral infarction without residual deficits: Secondary | ICD-10-CM | POA: Diagnosis not present

## 2017-08-22 DIAGNOSIS — I350 Nonrheumatic aortic (valve) stenosis: Secondary | ICD-10-CM | POA: Diagnosis not present

## 2017-08-22 DIAGNOSIS — Z87891 Personal history of nicotine dependence: Secondary | ICD-10-CM

## 2017-08-22 MED ORDER — TAMSULOSIN HCL 0.4 MG PO CAPS: 0.4000 mg | ORAL_CAPSULE | Freq: Every day | ORAL | 0 refills | Status: DC

## 2017-08-22 MED ORDER — ASPIRIN EC 81 MG PO TBEC: 81.0000 mg | DELAYED_RELEASE_TABLET | Freq: Every day | ORAL | 1 refills | Status: AC

## 2017-08-22 NOTE — Progress Notes (Signed)
Cardiology Office Note:    Date:  08/22/2017   ID:  Leonard Padilla, DOB [birth year], MRN [id number]  PCP:  Carlis Stable, PA-C  Cardiologist:  Garwin Brothers, MD   Referring MD: Donzetta Kohut*    ASSESSMENT:    1. Angina pectoris (HCC)   2. Ectatic abdominal aorta (HCC)   3. Hypertension goal BP (blood pressure) < 140/80   4. Moderate aortic stenosis by prior echocardiogram   5. Chest pain on exertion   6. Dyslipidemia, goal LDL below 70   7. Former smoker, stopped smoking in distant past   8. Status post CVA   9. Status post carotid endarterectomy    PLAN:    In order of problems listed above:  1. Secondary prevention stressed with patient.  Importance of compliance with diet and medications stressed and he vocalized understanding.  Diet was discussed for dyslipidemia this is followed by his primary care physician.  Blood pressure is stable. 2. In view of his significant symptoms I gave him the following options.In view of the patient's symptoms, I discussed with the patient options for evaluation. Invasive and noninvasive options were given to the patient. I discussed stress testing and coronary angiography and left heart catheterization at length. Benefits, pros and cons of each approach were discussed at length. Patient had multiple questions which were answered to the patient's satisfaction. Patient opted for invasive evaluation and we will set up for coronary angiography and left heart catheterization. Further recommendations will be made based on the findings with coronary angiography. In the interim if the patient has any significant symptoms in hospital to the nearest emergency room. 3. The evaluation of his murmur suggesting of aortic stenosis will also be achieved by coronary angiography and left heart catheterization.  He has renal insufficiency on lab work done previously and he may not necessarily need a left ventriculogram but I would leave it to  the discretion of my colleagues who is performing the procedure. 4. He may need medications for renal protection during the procedure. 5. He has had carotid evaluation last year and this reveals less than 50% stenosis and I reviewed the report. 6. Sublingual nitroglycerin prescription was sent, its protocol and 911 protocol explained and the patient vocalized understanding questions were answered to the patient's satisfaction   Medication Adjustments/Labs and Tests Ordered: Current medicines are reviewed at length with the patient today.  Concerns regarding medicines are outlined above.  Orders Placed This Encounter  Procedures  . DG Chest 2 View  . Protime-INR  . Basic metabolic panel  . CBC with Differential/Platelet  . ECHOCARDIOGRAM COMPLETE   Meds ordered this encounter  Medications  . aspirin EC 81 MG tablet    Sig: Take 1 tablet (81 mg total) by mouth daily.    Dispense:  90 tablet    Refill:  1     History of Present Illness:    Leonard Padilla is a 72 y.o. male who is being seen today for the evaluation of chest pain on exertion at the request of Donzetta Kohut*.  Patient is a pleasant 72 year old male.  He has past medical history of atherosclerotic vascular disease and right carotid endarterectomy.  He has had stroke in the past.  He has history of essential hypertension and dyslipidemia.  He has been smoking in the remote past.  He mentions to me that he has had some chest tightness on exertion which occurs consistently when he exerts himself.  It  radiates to the neck.  No orthopnea or PND.  For this reason he is here for an evaluation.  There is history of aortic stenosis documented in the chart.  Past Medical History:  Diagnosis Date  . Aortic stenosis    moderate AS 12/01/15 echo (Dr. Elberta Fortisamnitz)  . Arthritis   . CKD (chronic kidney disease) stage 3, GFR 30-59 ml/min (HCC) 08/04/2016  . CKD stage G3a/A1, GFR 45-59 and albumin creatinine ratio <30 mg/g (HCC)  08/04/2016  . Dysthymia 03/18/2017  . 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 Essential hypertension 10/13/2015  . Flu 09/2015   influenza A  . Former smoker, stopped smoking in distant past   . GERD (gastroesophageal reflux disease)    occ  . Heart murmur   . History of colon polyps   . Hyperlipidemia   . 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 Pneumonia 09/2015  . Postural dizziness with presyncope 08/27/2016  . Psoriasis   . Sleep apnea    to pick cpap tomorrow 12/13/15  . Stroke (HCC) 09/2015  . Symptomatic carotid artery stenosis 12/15/2015  . 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 ENDARTERECTOMY Right 12/15/2015   Procedure: RIGHT CAROTID ENDARTERECTOMY WITH PATCH ANGIOPLASTY;  Surgeon: Nada LibmanVance W Brabham, MD;  [facility];  Service: Vascular;  Laterality: Right;  . PATCH ANGIOPLASTY Right 12/15/2015   Procedure: RIGHT CAROTID PATCH ANGIOPLASTY;  Surgeon: Nada LibmanVance W Brabham, MD;  Location: [facility] OR;  Service: Vascular;  Laterality: Right;    Current Medications: Current Meds  Medication Sig  . atorvastatin (LIPITOR) 40 MG tablet TAKE 2 TABLETS(80 MG) BY MOUTH DAILY  . candesartan (ATACAND) 8 MG tablet Take 1 tablet (8 mg total) by mouth daily.  . clopidogrel (PLAVIX) 75 MG tablet Take 1 tablet (75 mg total) by mouth daily.  Marland Kitchen. Dextromethorphan-Quinidine 20-10 MG CAPS Take 10 mg by mouth daily.  . hydrochlorothiazide (HYDRODIURIL) 12.5 MG tablet Take 1 tablet (12.5 mg total) by mouth daily.  . nitroGLYCERIN (NITROSTAT) 0.4 MG SL tablet Place 1 tablet (0.4 mg total) under the tongue every 5 (five) minutes as needed for chest pain.  Marland Kitchen. sertraline (ZOLOFT) 25 MG tablet 1 tab PO daily. NEEDS APPT FOR REFILLS (Patient taking differently: Take 25 mg by mouth daily. 1 tab PO daily. NEEDS APPT FOR REFILLS)  . [DISCONTINUED] tamsulosin (FLOMAX) 0.4 MG CAPS capsule TK 1 C PO QD     Allergies:   Viagra  [sildenafil citrate]   Social History   Socioeconomic History  . Marital status: Married    Spouse name: [name]  . Number of children: 2  . Years of education: 2513  . Highest education level: None  Social Needs  . Financial resource strain: None  . Food insecurity - worry: None  . Food insecurity - inability: None  . Transportation needs - medical: None  . Transportation needs - non-medical: None  Occupational History  . 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 Smoking status: Former Smoker    Last attempt to quit: 08/07/1983    Years since quitting: 34.0  . Smokeless tobacco: Never Used  Substance and Sexual Activity  . Alcohol use: Yes    Comment: 2 glasses wine daily quit for now  . Drug use: No  . Sexual activity: Not Currently  Other Topics Concern  . None  Social History Narrative  Lives with wife   Caffeine use- coffee 3-4 cups daily     Family History: The patient's family history includes Cancer in his father and mother.  ROS:   Please see the history of present illness.    All other systems reviewed and are negative.  EKGs/Labs/Other Studies Reviewed:    The following studies were reviewed today: Discussed my findings with the patient at extensive length.  Physician records extensively.   Recent Labs: 08/12/2017: ALT 24; BUN 33; Creat 1.48; Hemoglobin 13.5; Platelets 262; Potassium 4.9; Sodium 136  Recent Lipid Panel    Component Value Date/Time   CHOL 190 04/24/2017 1156   TRIG 140 04/24/2017 1156   HDL 69 04/24/2017 1156   CHOLHDL 2.8 04/24/2017 1156   VLDL 23 08/03/2016 1115   LDLCALC 115 (H) 08/03/2016 1115    Physical Exam:    VS:  BP 122/64 (BP Location: Right Arm, Patient Position: Sitting, Cuff Size: Normal)   Pulse 80   Ht 5\' 8"  (1.727 m)   Wt 189 lb (85.7 kg)   SpO2 98%   BMI 28.74 kg/m     Wt Readings from Last 3 Encounters:  08/22/17 189 lb (85.7 kg)  08/12/17 184 lb 14.4 oz (83.9 kg)    04/24/17 178 lb (80.7 kg)     GEN: Patient is in no acute distress HEENT: Normal NECK: No JVD; No carotid bruits LYMPHATICS: No lymphadenopathy CARDIAC: S1 S2 regular, 2/6 systolic murmur at the apex. RESPIRATORY:  Clear to auscultation without rales, wheezing or rhonchi  ABDOMEN: Soft, non-tender, non-distended MUSCULOSKELETAL:  No edema; No deformity  SKIN: Warm and dry NEUROLOGIC:  Alert and oriented x 3 PSYCHIATRIC:  Normal affect    Signed, Garwin Brothers, MD  08/22/2017 2:52 PM    [facility]

## 2017-08-22 NOTE — Patient Instructions (Signed)
Medication Instructions:  Your physician has recommended you make the following change in your medication:  START EC aspirin 81 mg daily  Labwork: Your physician recommends that you have the following labs drawn: CBC, Bmp and INR  Testing/Procedures: Your physician has requested that you have an echocardiogram. Echocardiography is a painless test that uses sound waves to create images of your heart. It provides your doctor with information about the size and shape of your heart and how well your heart's chambers and valves are working. This procedure takes approximately one hour. There are no restrictions for this procedure.  A chest x-ray takes a picture of the organs and structures inside the chest, including the heart, lungs, and blood vessels. This test can show several things, including, whether the heart is enlarges; whether fluid is building up in the lungs; and whether pacemaker / defibrillator leads are still in place.    [facility] CARDIOVASCULAR DIVISION [facility] [place] [address], [address] [place] [address] Dept: [phone] Loc: [phone]  IRVIN BASTIN  08/22/2017  You are scheduled for a Cardiac Catheterization on Friday, January 18 with Dr. Peter Swaziland.  1. Please arrive at the [facility] (Main Entrance A) at [facility]: [address] [place], [place] [address] at 11:30 AM (two hours before your procedure to ensure your preparation). Free valet parking service is available.   Special note: Every effort is made to have your procedure done on time. Please understand that emergencies sometimes delay scheduled procedures.  2. Diet: Do not eat or drink anything after midnight prior to your procedure except sips of water to take medications.  3. Labs: Done today  4. Medication instructions in preparation for your procedure:  Be sure to take your plavix and blood pressure medications the morning of.  On the  morning of your procedure, take your Aspirin and any morning medicines NOT listed above.  You may use sips of water.  5. Plan for one night stay--bring personal belongings. 6. Bring a current list of your medications and current insurance cards. 7. You MUST have a responsible person to drive you home. 8. Someone MUST be with you the first 24 hours after you arrive home or your discharge will be delayed. 9. Please wear clothes that are easy to get on and off and wear slip-on shoes.  Thank you for allowing [place] to care for you!   --  Invasive Cardiovascular services   Follow-Up: Your physician recommends that you schedule a follow-up appointment in: 1 month  Any Other Special Instructions Will Be Listed Below (If Applicable).     If you need a refill on your cardiac medications before your next appointment, please call your pharmacy.   [facility]  Garey Ham, RN, BSN    Coronary Angiogram With Stent Coronary angiogram with stent placement is a procedure to widen or open a narrow blood vessel of the heart (coronary artery). Arteries may become blocked by cholesterol buildup (plaques) in the lining of the wall. When a coronary artery becomes partially blocked, blood flow to that area decreases. This may lead to chest pain or a heart attack (myocardial infarction). A stent is a small piece of metal that looks like mesh or a spring. Stent placement may be done as treatment for a heart attack or right after a coronary angiogram in which a blocked artery is found. Let your health care provider know about:  Any allergies  you have.  All medicines you are taking, including vitamins, herbs, eye drops, creams, and over-the-counter medicines.  Any problems you or family members have had with anesthetic medicines.  Any blood disorders you have.  Any surgeries you have had.  Any medical conditions you have.  Whether you are pregnant or may be pregnant. What are the  risks? Generally, this is a safe procedure. However, problems may occur, including:  Damage to the heart or its blood vessels.  A return of blockage.  Bleeding, infection, or bruising at the insertion site.  A collection of blood under the skin (hematoma) at the insertion site.  A blood clot in another part of the body.  Kidney injury.  Allergic reaction to the dye or contrast that is used.  Bleeding into the abdomen (retroperitoneal bleeding).  What happens before the procedure? Staying hydrated Follow instructions from your health care provider about hydration, which may include:  Up to 2 hours before the procedure - you may continue to drink clear liquids, such as water, clear fruit juice, black coffee, and plain tea.  Eating and drinking restrictions Follow instructions from your health care provider about eating and drinking, which may include:  8 hours before the procedure - stop eating heavy meals or foods such as meat, fried foods, or fatty foods.  6 hours before the procedure - stop eating light meals or foods, such as toast or cereal.  2 hours before the procedure - stop drinking clear liquids.  Ask your health care provider about:  Changing or stopping your regular medicines. This is especially important if you are taking diabetes medicines or blood thinners.  Taking medicines such as ibuprofen. These medicines can thin your blood. Do not take these medicines before your procedure if your health care provider instructs you not to. Generally, aspirin is recommended before a procedure of passing a small, thin tube (catheter) through a blood vessel and into the heart (cardiac catheterization).  What happens during the procedure?  An IV tube will be inserted into one of your veins.  You will be given one or more of the following: ? A medicine to help you relax (sedative). ? A medicine to numb the area where the catheter will be inserted into an artery (local  anesthetic).  To reduce your risk of infection: ? Your health care team will wash or sanitize their hands. ? Your skin will be washed with soap. ? Hair may be removed from the area where the catheter will be inserted.  Using a guide wire, the catheter will be inserted into an artery. The location may be in your groin, in your wrist, or in the fold of your arm (near your elbow).  A type of X-ray (fluoroscopy) will be used to help guide the catheter to the opening of the arteries in the heart.  A dye will be injected into the catheter, and X-rays will be taken. The dye will help to show where any narrowing or blockages are located in the arteries.  A tiny wire will be guided to the blocked spot, and a balloon will be inflated to make the artery wider.  The stent will be expanded and will crush the plaques into the wall of the vessel. The stent will hold the area open and improve the blood flow. Most stents have a drug coating to reduce the risk of the stent narrowing over time.  The artery may be made wider using a drill, laser, or other tools to remove  plaques.  When the blood flow is better, the catheter will be removed. The lining of the artery will grow over the stent, which stays where it was placed. This procedure may vary among health care providers and hospitals. What happens after the procedure?  If the procedure is done through the leg, you will be kept in bed lying flat for about 6 hours. You will be instructed to not bend and not cross your legs.  The insertion site will be checked frequently.  The pulse in your foot or wrist will be checked frequently.  You may have additional blood tests, X-rays, and a test that records the electrical activity of your heart (electrocardiogram, or ECG). This information is not intended to replace advice given to you by your health care provider. Make sure you discuss any questions you have with your health care provider. Document Released:  01/27/2003 Document Revised: 03/22/2016 Document Reviewed: 02/26/2016 Elsevier Interactive Patient Education  2018 [employer] Echocardiogram An echocardiogram, or echocardiography, uses sound waves (ultrasound) to produce an image of your heart. The echocardiogram is simple, painless, obtained within a short period of time, and offers valuable information to your health care provider. The images from an echocardiogram can provide information such as:  Evidence of coronary artery disease (CAD).  Heart size.  Heart muscle function.  Heart valve function.  Aneurysm detection.  Evidence of a past heart attack.  Fluid buildup around the heart.  Heart muscle thickening.  Assess heart valve function.  Tell a health care provider about:  Any allergies you have.  All medicines you are taking, including vitamins, herbs, eye drops, creams, and over-the-counter medicines.  Any problems you or family members have had with anesthetic medicines.  Any blood disorders you have.  Any surgeries you have had.  Any medical conditions you have.  Whether you are pregnant or may be pregnant. What happens before the procedure? No special preparation is needed. Eat and drink normally. What happens during the procedure?  In order to produce an image of your heart, gel will be applied to your chest and a wand-like tool (transducer) will be moved over your chest. The gel will help transmit the sound waves from the transducer. The sound waves will harmlessly bounce off your heart to allow the heart images to be captured in real-time motion. These images will then be recorded.  You may need an IV to receive a medicine that improves the quality of the pictures. What happens after the procedure? You may return to your normal schedule including diet, activities, and medicines, unless your health care provider tells you otherwise. This information is not intended to replace advice given to you by  your health care provider. Make sure you discuss any questions you have with your health care provider. Document Released: 07/20/2000 Document Revised: 03/10/2016 Document Reviewed: 03/30/2013 Elsevier Interactive Patient Education  2017 [employer]

## 2017-08-22 NOTE — H&P (View-Only) (Signed)
Cardiology Office Note:    Date:  08/22/2017   ID:  Leonard Padilla, DOB [birth year], MRN [id number]  PCP:  Carlis Stable, PA-C  Cardiologist:  Garwin Brothers, MD   Referring MD: Donzetta Kohut*    ASSESSMENT:    1. Angina pectoris (HCC)   2. Ectatic abdominal aorta (HCC)   3. Hypertension goal BP (blood pressure) < 140/80   4. Moderate aortic stenosis by prior echocardiogram   5. Chest pain on exertion   6. Dyslipidemia, goal LDL below 70   7. Former smoker, stopped smoking in distant past   8. Status post CVA   9. Status post carotid endarterectomy    PLAN:    In order of problems listed above:  1. Secondary prevention stressed with patient.  Importance of compliance with diet and medications stressed and he vocalized understanding.  Diet was discussed for dyslipidemia this is followed by his primary care physician.  Blood pressure is stable. 2. In view of his significant symptoms I gave him the following options.In view of the patient's symptoms, I discussed with the patient options for evaluation. Invasive and noninvasive options were given to the patient. I discussed stress testing and coronary angiography and left heart catheterization at length. Benefits, pros and cons of each approach were discussed at length. Patient had multiple questions which were answered to the patient's satisfaction. Patient opted for invasive evaluation and we will set up for coronary angiography and left heart catheterization. Further recommendations will be made based on the findings with coronary angiography. In the interim if the patient has any significant symptoms in hospital to the nearest emergency room. 3. The evaluation of his murmur suggesting of aortic stenosis will also be achieved by coronary angiography and left heart catheterization.  He has renal insufficiency on lab work done previously and he may not necessarily need a left ventriculogram but I would leave it to  the discretion of my colleagues who is performing the procedure. 4. He may need medications for renal protection during the procedure. 5. He has had carotid evaluation last year and this reveals less than 50% stenosis and I reviewed the report. 6. Sublingual nitroglycerin prescription was sent, its protocol and 911 protocol explained and the patient vocalized understanding questions were answered to the patient's satisfaction   Medication Adjustments/Labs and Tests Ordered: Current medicines are reviewed at length with the patient today.  Concerns regarding medicines are outlined above.  Orders Placed This Encounter  Procedures  . DG Chest 2 View  . Protime-INR  . Basic metabolic panel  . CBC with Differential/Platelet  . ECHOCARDIOGRAM COMPLETE   Meds ordered this encounter  Medications  . aspirin EC 81 MG tablet    Sig: Take 1 tablet (81 mg total) by mouth daily.    Dispense:  90 tablet    Refill:  1     History of Present Illness:    Leonard Padilla is a 72 y.o. male who is being seen today for the evaluation of chest pain on exertion at the request of Donzetta Kohut*.  Patient is a pleasant 72 year old male.  He has past medical history of atherosclerotic vascular disease and right carotid endarterectomy.  He has had stroke in the past.  He has history of essential hypertension and dyslipidemia.  He has been smoking in the remote past.  He mentions to me that he has had some chest tightness on exertion which occurs consistently when he exerts himself.  It  radiates to the neck.  No orthopnea or PND.  For this reason he is here for an evaluation.  There is history of aortic stenosis documented in the chart.  Past Medical History:  Diagnosis Date  . Aortic stenosis    moderate AS 12/01/15 echo (Dr. Elberta Fortisamnitz)  . Arthritis   . CKD (chronic kidney disease) stage 3, GFR 30-59 ml/min (HCC) 08/04/2016  . CKD stage G3a/A1, GFR 45-59 and albumin creatinine ratio <30 mg/g (HCC)  08/04/2016  . Dysthymia 03/18/2017  . 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 Essential hypertension 10/13/2015  . Flu 09/2015   influenza A  . Former smoker, stopped smoking in distant past   . GERD (gastroesophageal reflux disease)    occ  . Heart murmur   . History of colon polyps   . Hyperlipidemia   . 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 Pneumonia 09/2015  . Postural dizziness with presyncope 08/27/2016  . Psoriasis   . Sleep apnea    to pick cpap tomorrow 12/13/15  . Stroke (HCC) 09/2015  . Symptomatic carotid artery stenosis 12/15/2015  . 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 ENDARTERECTOMY Right 12/15/2015   Procedure: RIGHT CAROTID ENDARTERECTOMY WITH PATCH ANGIOPLASTY;  Surgeon: Nada LibmanVance W Brabham, MD;  [facility];  Service: Vascular;  Laterality: Right;  . PATCH ANGIOPLASTY Right 12/15/2015   Procedure: RIGHT CAROTID PATCH ANGIOPLASTY;  Surgeon: Nada LibmanVance W Brabham, MD;  Location: [facility] OR;  Service: Vascular;  Laterality: Right;    Current Medications: Current Meds  Medication Sig  . atorvastatin (LIPITOR) 40 MG tablet TAKE 2 TABLETS(80 MG) BY MOUTH DAILY  . candesartan (ATACAND) 8 MG tablet Take 1 tablet (8 mg total) by mouth daily.  . clopidogrel (PLAVIX) 75 MG tablet Take 1 tablet (75 mg total) by mouth daily.  Marland Kitchen. Dextromethorphan-Quinidine 20-10 MG CAPS Take 10 mg by mouth daily.  . hydrochlorothiazide (HYDRODIURIL) 12.5 MG tablet Take 1 tablet (12.5 mg total) by mouth daily.  . nitroGLYCERIN (NITROSTAT) 0.4 MG SL tablet Place 1 tablet (0.4 mg total) under the tongue every 5 (five) minutes as needed for chest pain.  Marland Kitchen. sertraline (ZOLOFT) 25 MG tablet 1 tab PO daily. NEEDS APPT FOR REFILLS (Patient taking differently: Take 25 mg by mouth daily. 1 tab PO daily. NEEDS APPT FOR REFILLS)  . [DISCONTINUED] tamsulosin (FLOMAX) 0.4 MG CAPS capsule TK 1 C PO QD     Allergies:   Viagra  [sildenafil citrate]   Social History   Socioeconomic History  . Marital status: Married    Spouse name: [name]  . Number of children: 2  . Years of education: 2513  . Highest education level: None  Social Needs  . Financial resource strain: None  . Food insecurity - worry: None  . Food insecurity - inability: None  . Transportation needs - medical: None  . Transportation needs - non-medical: None  Occupational History  . 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 Smoking status: Former Smoker    Last attempt to quit: 08/07/1983    Years since quitting: 34.0  . Smokeless tobacco: Never Used  Substance and Sexual Activity  . Alcohol use: Yes    Comment: 2 glasses wine daily quit for now  . Drug use: No  . Sexual activity: Not Currently  Other Topics Concern  . None  Social History Narrative  Lives with wife   Caffeine use- coffee 3-4 cups daily     Family History: The patient's family history includes Cancer in his father and mother.  ROS:   Please see the history of present illness.    All other systems reviewed and are negative.  EKGs/Labs/Other Studies Reviewed:    The following studies were reviewed today: Discussed my findings with the patient at extensive length.  Physician records extensively.   Recent Labs: 08/12/2017: ALT 24; BUN 33; Creat 1.48; Hemoglobin 13.5; Platelets 262; Potassium 4.9; Sodium 136  Recent Lipid Panel    Component Value Date/Time   CHOL 190 04/24/2017 1156   TRIG 140 04/24/2017 1156   HDL 69 04/24/2017 1156   CHOLHDL 2.8 04/24/2017 1156   VLDL 23 08/03/2016 1115   LDLCALC 115 (H) 08/03/2016 1115    Physical Exam:    VS:  BP 122/64 (BP Location: Right Arm, Patient Position: Sitting, Cuff Size: Normal)   Pulse 80   Ht 5\' 8"  (1.727 m)   Wt 189 lb (85.7 kg)   SpO2 98%   BMI 28.74 kg/m     Wt Readings from Last 3 Encounters:  08/22/17 189 lb (85.7 kg)  08/12/17 184 lb 14.4 oz (83.9 kg)    04/24/17 178 lb (80.7 kg)     GEN: Patient is in no acute distress HEENT: Normal NECK: No JVD; No carotid bruits LYMPHATICS: No lymphadenopathy CARDIAC: S1 S2 regular, 2/6 systolic murmur at the apex. RESPIRATORY:  Clear to auscultation without rales, wheezing or rhonchi  ABDOMEN: Soft, non-tender, non-distended MUSCULOSKELETAL:  No edema; No deformity  SKIN: Warm and dry NEUROLOGIC:  Alert and oriented x 3 PSYCHIATRIC:  Normal affect    Signed, Garwin Brothers, MD  08/22/2017 2:52 PM    [facility]

## 2017-08-23 ENCOUNTER — Ambulatory Visit (HOSPITAL_COMMUNITY)
Admission: RE | Admit: 2017-08-23 | Discharge: 2017-08-23 | Disposition: A | Discharge status: Home / Self Care | Payer: Medicare Other | Source: Ambulatory Visit | Attending: Cardiology | Admitting: Cardiology

## 2017-08-23 ENCOUNTER — Encounter (HOSPITAL_COMMUNITY)
Admission: RE | Disposition: A | Discharge status: Home / Self Care | Payer: Self-pay | Source: Ambulatory Visit | Attending: Cardiology

## 2017-08-23 DIAGNOSIS — Z79899 Other long term (current) drug therapy: Secondary | ICD-10-CM | POA: Diagnosis not present

## 2017-08-23 DIAGNOSIS — I10 Essential (primary) hypertension: Secondary | ICD-10-CM | POA: Diagnosis present

## 2017-08-23 DIAGNOSIS — Z8673 Personal history of transient ischemic attack (TIA), and cerebral infarction without residual deficits: Secondary | ICD-10-CM | POA: Insufficient documentation

## 2017-08-23 DIAGNOSIS — G4733 Obstructive sleep apnea (adult) (pediatric): Secondary | ICD-10-CM | POA: Diagnosis present

## 2017-08-23 DIAGNOSIS — Z87891 Personal history of nicotine dependence: Secondary | ICD-10-CM | POA: Insufficient documentation

## 2017-08-23 DIAGNOSIS — G4730 Sleep apnea, unspecified: Secondary | ICD-10-CM | POA: Insufficient documentation

## 2017-08-23 DIAGNOSIS — I129 Hypertensive chronic kidney disease with stage 1 through stage 4 chronic kidney disease, or unspecified chronic kidney disease: Secondary | ICD-10-CM | POA: Insufficient documentation

## 2017-08-23 DIAGNOSIS — I25119 Atherosclerotic heart disease of native coronary artery with unspecified angina pectoris: Secondary | ICD-10-CM | POA: Insufficient documentation

## 2017-08-23 DIAGNOSIS — I2584 Coronary atherosclerosis due to calcified coronary lesion: Secondary | ICD-10-CM | POA: Diagnosis not present

## 2017-08-23 DIAGNOSIS — N183 Chronic kidney disease, stage 3 unspecified: Secondary | ICD-10-CM | POA: Diagnosis present

## 2017-08-23 DIAGNOSIS — I77811 Abdominal aortic ectasia: Secondary | ICD-10-CM | POA: Insufficient documentation

## 2017-08-23 DIAGNOSIS — I350 Nonrheumatic aortic (valve) stenosis: Secondary | ICD-10-CM | POA: Insufficient documentation

## 2017-08-23 DIAGNOSIS — Z7902 Long term (current) use of antithrombotics/antiplatelets: Secondary | ICD-10-CM | POA: Insufficient documentation

## 2017-08-23 DIAGNOSIS — R079 Chest pain, unspecified: Secondary | ICD-10-CM | POA: Diagnosis not present

## 2017-08-23 DIAGNOSIS — E785 Hyperlipidemia, unspecified: Secondary | ICD-10-CM | POA: Diagnosis not present

## 2017-08-23 HISTORY — PX: RIGHT/LEFT HEART CATH AND CORONARY ANGIOGRAPHY: CATH118266

## 2017-08-23 LAB — POCT I-STAT 3, ART BLOOD GAS (G3+)
Acid-base deficit: 1 mmol/L (ref 0.0–2.0)
Bicarbonate: 23.3 mmol/L (ref 20.0–28.0)
O2 Saturation: 98 %
TCO2: 24 mmol/L (ref 22–32)
pCO2 arterial: 38.1 mmHg (ref 32.0–48.0)
pH, Arterial: 7.393 (ref 7.350–7.450)
pO2, Arterial: 106 mmHg (ref 83.0–108.0)

## 2017-08-23 LAB — POCT I-STAT 3, VENOUS BLOOD GAS (G3P V)
Bicarbonate: 25.4 mmol/L (ref 20.0–28.0)
O2 Saturation: 74 %
TCO2: 27 mmol/L (ref 22–32)
pCO2, Ven: 43.2 mmHg — ABNORMAL LOW (ref 44.0–60.0)
pH, Ven: 7.376 (ref 7.250–7.430)
pO2, Ven: 41 mmHg (ref 32.0–45.0)

## 2017-08-23 LAB — PROTIME-INR
INR: 1 (ref 0.8–1.2)
Prothrombin Time: 10.5 s (ref 9.1–12.0)

## 2017-08-23 SURGERY — RIGHT/LEFT HEART CATH AND CORONARY ANGIOGRAPHY
Anesthesia: LOCAL

## 2017-08-23 MED ORDER — SODIUM CHLORIDE 0.9 % WEIGHT BASED INFUSION
3.0000 mL/kg/h | INTRAVENOUS | Status: AC
  Administered 2017-08-23: 3 mL/kg/h via INTRAVENOUS

## 2017-08-23 MED ORDER — ASPIRIN 81 MG PO CHEW: 81.0000 mg | CHEWABLE_TABLET | ORAL | Status: DC

## 2017-08-23 MED ORDER — SODIUM CHLORIDE 0.9 % IV SOLN: 250.0000 mL | INTRAVENOUS | Status: DC | PRN

## 2017-08-23 MED ORDER — LIDOCAINE HCL (PF) 1 % IJ SOLN
INTRAMUSCULAR | Status: AC
  Filled 2017-08-23: qty 30

## 2017-08-23 MED ORDER — HEPARIN (PORCINE) IN NACL 2-0.9 UNIT/ML-% IJ SOLN
INTRAMUSCULAR | Status: AC | PRN
  Administered 2017-08-23: 1000 mL

## 2017-08-23 MED ORDER — VERAPAMIL HCL 2.5 MG/ML IV SOLN
INTRAVENOUS | Status: DC | PRN
  Administered 2017-08-23: 10 mL via INTRA_ARTERIAL

## 2017-08-23 MED ORDER — SODIUM CHLORIDE 0.9% FLUSH: 3.0000 mL | INTRAVENOUS | Status: DC | PRN

## 2017-08-23 MED ORDER — HEPARIN SODIUM (PORCINE) 1000 UNIT/ML IJ SOLN
INTRAMUSCULAR | Status: DC | PRN
  Administered 2017-08-23: 4500 [IU] via INTRAVENOUS

## 2017-08-23 MED ORDER — HEPARIN SODIUM (PORCINE) 1000 UNIT/ML IJ SOLN
INTRAMUSCULAR | Status: AC
  Filled 2017-08-23: qty 1

## 2017-08-23 MED ORDER — SODIUM CHLORIDE 0.9 % WEIGHT BASED INFUSION: 1.0000 mL/kg/h | INTRAVENOUS | Status: DC

## 2017-08-23 MED ORDER — LIDOCAINE HCL (PF) 1 % IJ SOLN
INTRAMUSCULAR | Status: DC | PRN
  Administered 2017-08-23: 2 mL

## 2017-08-23 MED ORDER — IOPAMIDOL (ISOVUE-370) INJECTION 76%
INTRAVENOUS | Status: DC | PRN
  Administered 2017-08-23: 70 mL via INTRA_ARTERIAL

## 2017-08-23 MED ORDER — IOPAMIDOL (ISOVUE-370) INJECTION 76%
INTRAVENOUS | Status: AC
  Filled 2017-08-23: qty 100

## 2017-08-23 MED ORDER — HEPARIN (PORCINE) IN NACL 2-0.9 UNIT/ML-% IJ SOLN
INTRAMUSCULAR | Status: AC
  Filled 2017-08-23: qty 1000

## 2017-08-23 MED ORDER — VERAPAMIL HCL 2.5 MG/ML IV SOLN
INTRAVENOUS | Status: AC
  Filled 2017-08-23: qty 2

## 2017-08-23 MED ORDER — MIDAZOLAM HCL 2 MG/2ML IJ SOLN
INTRAMUSCULAR | Status: DC | PRN
  Administered 2017-08-23: 1 mg via INTRAVENOUS

## 2017-08-23 MED ORDER — SODIUM CHLORIDE 0.9% FLUSH: 3.0000 mL | Freq: Two times a day (BID) | INTRAVENOUS | Status: DC

## 2017-08-23 MED ORDER — MIDAZOLAM HCL 2 MG/2ML IJ SOLN
INTRAMUSCULAR | Status: AC
  Filled 2017-08-23: qty 2

## 2017-08-23 MED ORDER — FENTANYL CITRATE (PF) 100 MCG/2ML IJ SOLN
INTRAMUSCULAR | Status: DC | PRN
  Administered 2017-08-23: 25 ug via INTRAVENOUS

## 2017-08-23 MED ORDER — FENTANYL CITRATE (PF) 100 MCG/2ML IJ SOLN
INTRAMUSCULAR | Status: AC
  Filled 2017-08-23: qty 2

## 2017-08-23 SURGICAL SUPPLY — 15 items
CATH 5FR JL3.5 JR4 ANG PIG MP (CATHETERS) ×2 IMPLANT
CATH BALLN WEDGE 5F 110CM (CATHETERS) ×2 IMPLANT
CATH INFINITI 5FR AL1 (CATHETERS) ×2 IMPLANT
CATH LAUNCHER 5F RADR (CATHETERS) ×1 IMPLANT
CATHETER LAUNCHER 5F RADR (CATHETERS) ×2
DEVICE RAD COMP TR BAND LRG (VASCULAR PRODUCTS) ×2 IMPLANT
GLIDESHEATH SLEND SS 6F .021 (SHEATH) ×2 IMPLANT
GUIDEWIRE INQWIRE 1.5J.035X260 (WIRE) ×2 IMPLANT
INQWIRE 1.5J .035X260CM (WIRE) ×4
KIT HEART LEFT (KITS) ×2 IMPLANT
PACK CARDIAC CATHETERIZATION (CUSTOM PROCEDURE TRAY) ×2 IMPLANT
SHEATH GLIDE SLENDER 4/5FR (SHEATH) ×2 IMPLANT
TRANSDUCER W/STOPCOCK (MISCELLANEOUS) ×2 IMPLANT
TUBING CIL FLEX 10 FLL-RA (TUBING) ×2 IMPLANT
WIRE EMERALD ST .035X150CM (WIRE) ×2 IMPLANT

## 2017-08-23 NOTE — Interval H&P Note (Signed)
History and Physical Interval Note:  08/23/2017 1:03 PM  Leonard Padilla  has presented today for surgery, with the diagnosis of angina  The various methods of treatment have been discussed with the patient and family. After consideration of risks, benefits and other options for treatment, the patient has consented to  Procedure(s): RIGHT/LEFT HEART CATH AND CORONARY ANGIOGRAPHY (N/A) as a surgical intervention .  The patient's history has been reviewed, patient examined, no change in status, stable for surgery.  I have reviewed the patient's chart and labs.  Questions were answered to the patient's satisfaction.   Cath Lab Visit (complete for each Cath Lab visit)  Clinical Evaluation Leading to the Procedure:   ACS: No.  Non-ACS:    Anginal Classification: CCS III  Anti-ischemic medical therapy: No Therapy  Non-Invasive Test Results: No non-invasive testing performed  Prior CABG: No previous CABG        Peter SwazilandJordan MD,FACC 08/23/2017 1:03 PM

## 2017-08-23 NOTE — Research (Signed)
OPTIMIZE Informed Consent   Subject Name: MANFORD SPRONG  Subject met inclusion and exclusion criteria.  The informed consent form, study requirements and expectations were reviewed with the subject and questions and concerns were addressed prior to the signing of the consent form.  The subject verbalized understanding of the trail requirements.  The subject agreed to participate in the OPTIMIZE trial and signed the informed consent.  The informed consent was obtained prior to performance of any protocol-specific procedures for the subject.  A copy of the signed informed consent was given to the subject and a copy was placed in the subject's medical record.  Hedrick,Linton Stolp W 08/23/2017, 1150

## 2017-08-23 NOTE — Research (Signed)
CADFEM Informed Consent   Subject Name: Leonard Padilla  Subject met inclusion and exclusion criteria.  The informed consent form, study requirements and expectations were reviewed with the subject and questions and concerns were addressed prior to the signing of the consent form.  The subject verbalized understanding of the trail requirements.  The subject agreed to participate in the CADFEM trial and signed the informed consent.  The informed consent was obtained prior to performance of any protocol-specific procedures for the subject.  A copy of the signed informed consent was given to the subject and a copy was placed in the subject's medical record.  [name] 08/23/2017, 12:11 PM

## 2017-08-23 NOTE — Discharge Instructions (Signed)
Radial Site Care °Refer to this sheet in the next few weeks. These instructions provide you with information about caring for yourself after your procedure. Your health care provider may also give you more specific instructions. Your treatment has been planned according to current medical practices, but problems sometimes occur. Call your health care provider if you have any problems or questions after your procedure. °What can I expect after the procedure? °After your procedure, it is typical to have the following: °· Bruising at the radial site that usually fades within 1-2 weeks. °· Blood collecting in the tissue (hematoma) that may be painful to the touch. It should usually decrease in size and tenderness within 1-2 weeks. ° °Follow these instructions at home: °· Take medicines only as directed by your health care provider. °· You may shower 24-48 hours after the procedure or as directed by your health care provider. Remove the bandage (dressing) and gently wash the site with plain soap and water. Pat the area dry with a clean towel. Do not rub the site, because this may cause bleeding. °· Do not take baths, swim, or use a hot tub until your health care provider approves. °· Check your insertion site every day for redness, swelling, or drainage. °· Do not apply powder or lotion to the site. °· Do not flex or bend the affected arm for 24 hours or as directed by your health care provider. °· Do not push or pull heavy objects with the affected arm for 24 hours or as directed by your health care provider. °· Do not lift over 10 lb (4.5 kg) for 5 days after your procedure or as directed by your health care provider. °· Ask your health care provider when it is okay to: °? Return to work or school. °? Resume usual physical activities or sports. °? Resume sexual activity. °· Do not drive home if you are discharged the same day as the procedure. Have someone else drive you. °· You may drive 24 hours after the procedure  unless otherwise instructed by your health care provider. °· Do not operate machinery or power tools for 24 hours after the procedure. °· If your procedure was done as an outpatient procedure, which means that you went home the same day as your procedure, a responsible adult should be with you for the first 24 hours after you arrive home. °· Keep all follow-up visits as directed by your health care provider. This is important. °Contact a health care provider if: °· You have a fever. °· You have chills. °· You have increased bleeding from the radial site. Hold pressure on the site. °Get help right away if: °· You have unusual pain at the radial site. °· You have redness, warmth, or swelling at the radial site. °· You have drainage (other than a small amount of blood on the dressing) from the radial site. °· The radial site is bleeding, and the bleeding does not stop after 30 minutes of holding steady pressure on the site. °· Your arm or hand becomes pale, cool, tingly, or numb. °This information is not intended to replace advice given to you by your health care provider. Make sure you discuss any questions you have with your health care provider. °Document Released: 08/25/2010 Document Revised: 12/29/2015 Document Reviewed: 02/08/2014 °[employer]vier Interactive Patient Education © 2018 [employer] ° ° ° °Moderate Conscious Sedation, Adult, Care After °These instructions provide you with information about caring for yourself after your procedure. Your health care provider   may also give you more specific instructions. Your treatment has been planned according to current medical practices, but problems sometimes occur. Call your health care provider if you have any problems or questions after your procedure. °What can I expect after the procedure? °After your procedure, it is common: °· To feel sleepy for several hours. °· To feel clumsy and have poor balance for several hours. °· To have poor judgment for several  hours. °· To vomit if you eat too soon. ° °Follow these instructions at home: °For at least 24 hours after the procedure: ° °· Do not: °? Participate in activities where you could fall or become injured. °? Drive. °? Use heavy machinery. °? Drink alcohol. °? Take sleeping pills or medicines that cause drowsiness. °? Make important decisions or sign legal documents. °? Take care of children on your own. °· Rest. °Eating and drinking °· Follow the diet recommended by your health care provider. °· If you vomit: °? Drink water, juice, or soup when you can drink without vomiting. °? Make sure you have little or no nausea before eating solid foods. °General instructions °· Have a responsible adult stay with you until you are awake and alert. °· Take over-the-counter and prescription medicines only as told by your health care provider. °· If you smoke, do not smoke without supervision. °· Keep all follow-up visits as told by your health care provider. This is important. °Contact a health care provider if: °· You keep feeling nauseous or you keep vomiting. °· You feel light-headed. °· You develop a rash. °· You have a fever. °Get help right away if: °· You have trouble breathing. °This information is not intended to replace advice given to you by your health care provider. Make sure you discuss any questions you have with your health care provider. °Document Released: 05/13/2013 Document Revised: 12/26/2015 Document Reviewed: 11/12/2015 °[employer]vier Interactive Patient Education © 2018 [employer] ° °

## 2017-08-26 ENCOUNTER — Telehealth: Payer: Self-pay | Admitting: Cardiology

## 2017-08-26 ENCOUNTER — Encounter (HOSPITAL_COMMUNITY): Payer: Self-pay | Admitting: Cardiology

## 2017-08-26 ENCOUNTER — Telehealth: Payer: Self-pay

## 2017-08-26 ENCOUNTER — Other Ambulatory Visit: Payer: Self-pay

## 2017-08-26 DIAGNOSIS — R931 Abnormal findings on diagnostic imaging of heart and coronary circulation: Secondary | ICD-10-CM

## 2017-08-26 NOTE — Telephone Encounter (Signed)
The patient's wife called asking if it was necessary for the patient to have his scheduled ECHO tomorrow since he had a catheterization on Friday.   She was also inquiring about a referral to the cardio thoracic team. Message routed to the provider for his recommendation.

## 2017-08-26 NOTE — Telephone Encounter (Signed)
Spoke to patient's wife.Dr.Jordan advised keep echo appointment as planned.

## 2017-08-26 NOTE — Telephone Encounter (Signed)
Patient referred.

## 2017-08-26 NOTE — Telephone Encounter (Signed)
Patient wife Leonard Padilla([place]) calling, states that Dr. SwazilandJordan was going to refer the patient to specialist and she would like an update on the matter.

## 2017-08-26 NOTE — Telephone Encounter (Signed)
Patients wife said Dr. SwazilandJordan said Dr. Tomie Chinaevankar would refer Leonard Padilla to a surgeon.

## 2017-08-26 NOTE — Telephone Encounter (Signed)
Returned the call to [place]. Per Dr. Swazil[place]Jordan, the patient will need to have the ECHO completed tomorrow. Dr. Elvis CoilJordan's nurse will be touching base with Morrie Sheldonshley to make her aware.

## 2017-08-26 NOTE — Telephone Encounter (Signed)
Morrie Sheldonshley calling with Dr. Lynnell Dikeevinkar, has a few questions about the tests that patient is scheduled to complete

## 2017-08-26 NOTE — Telephone Encounter (Signed)
Echo appointment left in the system per Dr. SwazilandJordan. AMB referral to [facility] made by Dr. Tomie Chinaevankar.

## 2017-08-26 NOTE — Progress Notes (Signed)
AMB referral to [facility] made. Echo stays scheduled per Dr. SwazilandJordan.

## 2017-08-27 ENCOUNTER — Encounter: Payer: Self-pay | Admitting: Physician Assistant

## 2017-08-27 ENCOUNTER — Ambulatory Visit (HOSPITAL_COMMUNITY)
Admission: RE | Admit: 2017-08-27 | Discharge: 2017-08-27 | Disposition: A | Discharge status: Home / Self Care | Payer: Medicare Other | Source: Ambulatory Visit | Attending: Cardiology | Admitting: Cardiology

## 2017-08-27 DIAGNOSIS — I209 Angina pectoris, unspecified: Secondary | ICD-10-CM

## 2017-08-27 DIAGNOSIS — I5030 Unspecified diastolic (congestive) heart failure: Secondary | ICD-10-CM | POA: Diagnosis not present

## 2017-08-27 DIAGNOSIS — I080 Rheumatic disorders of both mitral and aortic valves: Secondary | ICD-10-CM | POA: Insufficient documentation

## 2017-08-27 NOTE — Progress Notes (Signed)
  Echocardiogram 2D Echocardiogram has been performed.  Leonard Padilla 08/27/2017, 10:43 AM

## 2017-09-02 ENCOUNTER — Other Ambulatory Visit: Payer: Self-pay

## 2017-09-02 ENCOUNTER — Encounter: Payer: Self-pay | Admitting: Cardiothoracic Surgery

## 2017-09-02 ENCOUNTER — Institutional Professional Consult (permissible substitution): Payer: Medicare Other | Admitting: Cardiothoracic Surgery

## 2017-09-02 VITALS — BP 117/74 | HR 75 | Resp 18 | Ht 68.5 in | Wt 181.4 lb

## 2017-09-02 DIAGNOSIS — I350 Nonrheumatic aortic (valve) stenosis: Secondary | ICD-10-CM | POA: Diagnosis not present

## 2017-09-02 DIAGNOSIS — I2510 Atherosclerotic heart disease of native coronary artery without angina pectoris: Secondary | ICD-10-CM

## 2017-09-02 NOTE — Progress Notes (Signed)
[address]       [place] [address]             [phone]                    Leonard Padilla [facility] Medical Record #[id number] Date of Birth: [birth year]  Referring: Dr Swaziland Primary Care: Carlis Stable, [place] Primary Cardiologist: Garwin Brothers, MD  Chief Complaint:    Chief Complaint  Patient presents with  . New Patient (Initial Visit)    AVR and CABG evaluation, Cath 08/23/2017 ECHO 08/27/2017    History of Present Illness:    Leonard Padilla 72 y.o. male is seen in the office  Today at the request of Dr. Swaziland following cardiac catheterization done January 18.  Patient gives a history over the past 6 months but worsening over the past 3 months of shortness of breath when walking up the hill from his mailbox he also has associated chest tightness with the shortness of breath.  This primarily occurs with exertion.  His wife noted at least one episode when he was moving chair became so tired that he laid on the floor for 10 or 15 minutes.  He specifically denies any syncopal episodes.  He has a history of a murmur since age 33.  Notes that he was accepted in [employer] with a known murmur. He has a previous history of stroke which occurred in February 2017 while in [place], manifest primarily with left arm and leg weakness which resolved over several months.  Subsequently returning to [facility] he underwent right carotid endarterectomy.  Patient denies diabetes, is a distant smoker but quit in 1983  He has known chronic kidney disease: Stage III  Current Activity/ Functional Status:  Patient is independent with mobility/ambulation, transfers, ADL's, IADL's.   Zubrod Score: At the time of surgery this patient's most appropriate activity status/level should be described as: []     0    Normal activity, no symptoms [x]     1    Restricted in physical strenuous activity but ambulatory, able to do out light work []     2    Ambulatory  and capable of self care, unable to do work activities, up and about               >50 % of waking hours                              []     3    Only limited self care, in bed greater than 50% of waking hours []     4    Completely disabled, no self care, confined to bed or chair []     5    Moribund   Past Medical History:  Diagnosis Date  . Aortic stenosis    moderate AS 12/01/15 echo (Dr. Elberta Fortis)  . Arthritis   . CKD (chronic kidney disease) stage 3, GFR 30-59 ml/min (HCC) 08/04/2016  . CKD stage G3a/A1, GFR 45-59 and albumin creatinine ratio <30 mg/g (HCC) 08/04/2016  . Dysthymia 03/18/2017  . 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 Essential hypertension 10/13/2015  . Flu 09/2015   influenza A  . Former smoker, stopped smoking in distant past   . GERD (gastroesophageal reflux disease)    occ  . Heart murmur   .  History of colon polyps   . Hyperlipidemia   . 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 Pneumonia 09/2015  . Postural dizziness with presyncope 08/27/2016  . Psoriasis   . Sleep apnea    to pick cpap tomorrow 12/13/15  . Stroke (HCC) 09/2015  . Symptomatic carotid artery stenosis 12/15/2015  . 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 ENDARTERECTOMY Right 12/15/2015   Procedure: RIGHT CAROTID ENDARTERECTOMY WITH PATCH ANGIOPLASTY;  Surgeon: Nada Libman, MD;  [facility];  Service: Vascular;  Laterality: Right;  . PATCH ANGIOPLASTY Right 12/15/2015   Procedure: RIGHT CAROTID PATCH ANGIOPLASTY;  Surgeon: Nada Libman, MD;  Location: [facility] OR;  Service: Vascular;  Laterality: Right;  . RIGHT/LEFT HEART CATH AND CORONARY ANGIOGRAPHY N/A 08/23/2017   Procedure: RIGHT/LEFT HEART CATH AND CORONARY ANGIOGRAPHY;  Surgeon: Swaziland, Peter M, MD;  Location: [facility] INVASIVE CV LAB;  Service: Cardiovascular;  Laterality: N/A;    Family History  Problem Relation Age of Onset  . Cancer Mother          ovarian cancer  . Cancer Father        Prostate    Social History   Socioeconomic History  . Marital status: Married    Spouse name: [name]  . Number of children: 2  . Years of education: 64  . Highest education level: Not on file  Social Needs  . Financial resource strain: Not on file  . Food insecurity - worry: Not on file  . Food insecurity - inability: Not on file  . Transportation needs - medical: Not on file  . Transportation needs - non-medical: Not on file  Occupational History  . Occupation: Retired    Comment: retired Clinical biochemist, [employer]  Tobacco Use  . Smoking status: Former Smoker    Last attempt to quit: 08/07/1983    Years since quitting: 34.0  . Smokeless tobacco: Never Used  Substance and Sexual Activity  . Alcohol use: Yes    Comment: 2 glasses wine daily quit for now  . Drug use: No  . Sexual activity: Not Currently  Other Topics Concern  . Not on file  Social History Narrative   Lives with wife   Caffeine use- coffee 3-4 cups daily    Social History   Tobacco Use  Smoking Status Former Smoker  . Last attempt to quit: 08/07/1983  . Years since quitting: 34.0  Smokeless Tobacco Never Used    Social History   Substance and Sexual Activity  Alcohol Use Yes   Comment: 2 glasses wine daily quit for now     Allergies  Allergen Reactions  . Viagra [Sildenafil Citrate] Other (See Comments)    "blue sensation"    Current Outpatient Medications  Medication Sig Dispense Refill  . aspirin EC 81 MG tablet Take 1 tablet (81 mg total) by mouth daily. 90 tablet 1  . atorvastatin (LIPITOR) 40 MG tablet TAKE 2 TABLETS(80 MG) BY MOUTH DAILY (Patient taking differently: TAKE 2 TABLETS(80 MG) BY MOUTH AT BEDTIME) 180 tablet 0  . candesartan (ATACAND) 8 MG tablet Take 1 tablet (8 mg total) by mouth daily. 90 tablet 1  . hydrochlorothiazide (HYDRODIURIL) 12.5 MG tablet Take 1 tablet (12.5 mg total) by mouth daily. 90 tablet 0  . nitroGLYCERIN  (NITROSTAT) 0.4 MG SL tablet Place 1 tablet (0.4 mg total) under the tongue every 5 (five) minutes as needed for chest pain. 20 tablet 0  .  sertraline (ZOLOFT) 25 MG tablet 1 tab PO daily. NEEDS APPT FOR REFILLS (Patient taking differently: Take 25 mg by mouth daily. 1 tab PO daily. NEEDS APPT FOR REFILLS) 90 tablet 1  . tamsulosin (FLOMAX) 0.4 MG CAPS capsule Take 1 capsule (0.4 mg total) by mouth daily. 90 capsule 0  . clobetasol cream (TEMOVATE) 0.05 % Apply 1 application topically daily.    . clopidogrel (PLAVIX) 75 MG tablet Take 75 mg by mouth daily.     No current facility-administered medications for this visit.     Pertinent items are noted in HPI.   Review of Systems:  Review of Systems  Constitutional: Positive for malaise/fatigue. Negative for chills, diaphoresis, fever and weight loss.  HENT: Negative.   Eyes: Negative.   Respiratory: Positive for shortness of breath. Negative for cough, hemoptysis, sputum production and wheezing.   Cardiovascular: Positive for chest pain and orthopnea. Negative for palpitations, claudication, leg swelling and PND.  Gastrointestinal: Negative for abdominal pain, blood in stool, constipation, diarrhea, heartburn, melena, nausea and vomiting.  Genitourinary: Positive for frequency. Negative for flank pain, hematuria and urgency.  Musculoskeletal: Positive for joint pain. Negative for back pain, falls, myalgias and neck pain.  Skin: Positive for rash. Negative for itching.  Neurological: Positive for weakness. Negative for dizziness, tingling, tremors, sensory change, speech change, focal weakness, seizures, loss of consciousness and headaches.  Endo/Heme/Allergies: Negative for environmental allergies and polydipsia. Bruises/bleeds easily.  Psychiatric/Behavioral: Negative.    Dental: Patient gets regular dental care, is unaware of any current issues Immunizations: Flu up to date [ YES]; Pneumococcal up to date [ YES ];   Physical Exam: BP  117/74 (BP Location: Left Arm, Patient Position: Sitting, Cuff Size: Large)   Pulse 75   Resp 18   Ht 5' 8.5" (1.74 m)   Wt 181 lb 6.4 oz (82.3 kg)   SpO2 98% Comment: RA  BMI 27.18 kg/m   PHYSICAL EXAMINATION: General appearance: alert, cooperative and appears older than stated age Head: Normocephalic, without obvious abnormality, atraumatic Neck: no adenopathy, no carotid bruit, no JVD, supple, symmetrical, trachea midline and thyroid not enlarged, symmetric, no tenderness/mass/nodules Lymph nodes: Cervical, supraclavicular, and axillary nodes normal. Resp: clear to auscultation bilaterally Back: symmetric, no curvature. ROM normal. No CVA tenderness. Cardio: systolic murmur: holosystolic 3/6, crescendo throughout the precordium GI: soft, non-tender; bowel sounds normal; no masses,  no organomegaly Extremities: extremities normal, atraumatic, no cyanosis or edema Neurologic: Grossly normal Patient has 1+ DP and PT pulses bilaterally Healed right carotid endarterectomy scar  Diagnostic Studies & Laboratory data:     Recent Radiology Findings:   Dg Chest 2 View  Result Date: 08/12/2017 CLINICAL DATA:  Midsternal chest pain. EXAM: CHEST  2 VIEW COMPARISON:  None. FINDINGS: Normal heart size and mediastinal contours. No acute infiltrate or edema. No effusion or pneumothorax. Spondylosis. No acute osseous findings. IMPRESSION: No evidence of active disease. Electronically Signed   By: Marnee Spring M.D.   On: 08/12/2017 16:16     I have independently reviewed the above radiology studies  and reviewed the findings with the patient.   Recent Lab Findings: Lab Results  Component Value Date   WBC 7.0 08/12/2017   HGB 13.5 08/12/2017   HCT 39.2 08/12/2017   PLT 262 08/12/2017   GLUCOSE 94 08/12/2017   CHOL 190 04/24/2017   TRIG 140 04/24/2017   HDL 69 04/24/2017   LDLCALC 115 (H) 08/03/2016   ALT 24 08/12/2017   AST 21 08/12/2017  NA 136 08/12/2017   K 4.9 08/12/2017   CL  99 08/12/2017   CREATININE 1.48 (H) 08/12/2017   BUN 33 (H) 08/12/2017   CO2 28 08/12/2017   TSH 3.28 08/03/2016   INR 1.0 08/22/2017   HGBA1C 5.4 08/03/2016   ECHO: Transthoracic Echocardiography  Patient:    Gaylen, Venning MR #:       [id number] Study Date: 08/27/2017 Gender:     M Age:        72 Height:     174 cm Weight:     83.9 kg BSA:        2.03 m^2 Pt. Status: Room:   ATTENDING    Revankar, Elby Showers R  ORDERING     Belva Crome, MD  REFERRING    Belva Crome, MD  PERFORMING   [facility], Outpatient  SONOGRAPHER  Tobin Chad  cc:  ------------------------------------------------------------------- LV EF: 65% -   70%  ------------------------------------------------------------------- Indications:      (Angina pectoris).  ------------------------------------------------------------------- History:   PMH:  Systolic murmur CKD Moderate aortic stenosis Chest pain.  Risk factors:  Hypertension.  ------------------------------------------------------------------- Study Conclusions  - Left ventricle: The cavity size was normal. There was mild focal   basal hypertrophy of the septum. Systolic function was vigorous.   The estimated ejection fraction was in the range of 65% to 70%.   Wall motion was normal; there were no regional wall motion   abnormalities. Doppler parameters are consistent with abnormal   left ventricular relaxation (grade 1 diastolic dysfunction). - Aortic valve: Valve mobility was restricted. There was severe   stenosis. There was mild regurgitation. Peak velocity (S): 442   cm/s. Mean gradient (S): 50 mm Hg. Valve area (VTI): 0.82 cm^2.   Valve area (Vmax): 0.73 cm^2. Valve area (Vmean): 0.76 cm^2. - Mitral valve: There was mild regurgitation.  ------------------------------------------------------------------- Study data:  Comparison was made to the study of 12/01/2015.  Study status:  Routine.  Procedure:  Technically difficult  subcostal views. The patient reported no pain pre or post test. Transthoracic echocardiography. Image quality was adequate.  Study completion: There were no complications.          Transthoracic echocardiography.  M-mode, complete 2D, spectral Doppler, and color Doppler.  Birthdate:  Patient birthdate: March 09, 1946.  Age:  Patient is 71 yr old.  Sex:  Gender: male.    BMI: 27.7 kg/m^2.  Blood pressure:     110/60  Patient status:  Outpatient.  Study date: Study date: 08/27/2017. Study time: 09:54 AM.  Location:  Echo laboratory.  -------------------------------------------------------------------  ------------------------------------------------------------------- Left ventricle:  The cavity size was normal. There was mild focal basal hypertrophy of the septum. Systolic function was vigorous. The estimated ejection fraction was in the range of 65% to 70%. Wall motion was normal; there were no regional wall motion abnormalities. Doppler parameters are consistent with abnormal left ventricular relaxation (grade 1 diastolic dysfunction).  ------------------------------------------------------------------- Aortic valve:   Trileaflet; moderately thickened, moderately calcified leaflets. Valve mobility was restricted.  Doppler: There was severe stenosis.   There was mild regurgitation.    VTI ratio of LVOT to aortic valve: 0.29. Valve area (VTI): 0.82 cm^2. Indexed valve area (VTI): 0.4 cm^2/m^2. Peak velocity ratio of LVOT to aortic valve: 0.26. Valve area (Vmax): 0.73 cm^2. Indexed valve area (Vmax): 0.36 cm^2/m^2. Mean velocity ratio of LVOT to aortic valve: 0.27. Valve area (Vmean): 0.76 cm^2. Indexed valve area (Vmean): 0.38 cm^2/m^2.    Mean gradient (S): 50 mm Hg. Peak gradient (  S): 78 mm Hg.  ------------------------------------------------------------------- Aorta:  Aortic root: The aortic root was normal in  size.  ------------------------------------------------------------------- Mitral valve:   Structurally normal valve.   Mobility was not restricted.  Doppler:  Transvalvular velocity was within the normal range. There was no evidence for stenosis. There was mild regurgitation.    Peak gradient (D): 3 mm Hg.  ------------------------------------------------------------------- Left atrium:  The atrium was normal in size.  ------------------------------------------------------------------- Right ventricle:  The cavity size was normal. Wall thickness was normal. Systolic function was normal.  ------------------------------------------------------------------- Pulmonic valve:   Poorly visualized.  Structurally normal valve. Cusp separation was normal.  Doppler:  Transvalvular velocity was within the normal range. There was no evidence for stenosis. There was no regurgitation.  ------------------------------------------------------------------- Tricuspid valve:   Structurally normal valve.    Doppler: Transvalvular velocity was within the normal range. There was no regurgitation.  ------------------------------------------------------------------- Pulmonary artery:   The main pulmonary artery was normal-sized. Systolic pressure was within the normal range.  ------------------------------------------------------------------- Right atrium:  The atrium was normal in size.  ------------------------------------------------------------------- Pericardium:  There was no pericardial effusion.  ------------------------------------------------------------------- Systemic veins: Inferior vena cava: The vessel was normal in size.  ------------------------------------------------------------------- Measurements   Left ventricle                           Value          Reference  LV ID, ED, PLAX chordal           (L)    41.1  mm       43 - 52  LV ID, ES, PLAX chordal                   25    mm       23 - 38  LV fx shortening, PLAX chordal           39    %        >=29  LV PW thickness, ED                      11.6  mm       ----------  IVS/LV PW ratio, ED                      1.03           <=1.3  Stroke volume, 2D                        80    ml       ----------  Stroke volume/bsa, 2D                    39    ml/m^2   ----------  LV e&', lateral                           9.32  cm/s     ----------  LV E/e&', lateral                         9.31           ----------  LV e&', medial                            8.38  cm/s     ----------  LV E/e&', medial                          10.36          ----------  LV e&', average                           8.85  cm/s     ----------  LV E/e&', average                         9.81           ----------  Longitudinal strain, TDI                 22    %        ----------    Ventricular septum                       Value          Reference  IVS thickness, ED                        12    mm       ----------    LVOT                                     Value          Reference  LVOT ID, S                               19    mm       ----------  LVOT area                                2.84  cm^2     ----------  LVOT peak velocity, S                    114   cm/s     ----------  LVOT mean velocity, S                    79.5  cm/s     ----------  LVOT VTI, S                              28.3  cm       ----------  LVOT peak gradient, S                    5     mm Hg    ----------    Aortic valve                             Value          Reference  Aortic valve peak velocity, S            442   cm/s     ----------  Aortic valve mean velocity, S  296   cm/s     ----------  Aortic valve VTI, S                      97.5  cm       ----------  Aortic mean gradient, S                  50    mm Hg    ----------  Aortic peak gradient, S                  78    mm Hg    ----------  VTI ratio, LVOT/AV                       0.29            ----------  Aortic valve area, VTI                   0.82  cm^2     ----------  Aortic valve area/bsa, VTI               0.4   cm^2/m^2 ----------  Velocity ratio, peak, LVOT/AV            0.26           ----------  Aortic valve area, peak velocity         0.73  cm^2     ----------  Aortic valve area/bsa, peak              0.36  cm^2/m^2 ----------  velocity  Velocity ratio, mean, LVOT/AV            0.27           ----------  Aortic valve area, mean velocity         0.76  cm^2     ----------  Aortic valve area/bsa, mean              0.38  cm^2/m^2 ----------  velocity  Aortic regurg pressure half-time         364   ms       ----------    Aorta                                    Value          Reference  Aortic root ID, ED                       30    mm       ----------    Left atrium                              Value          Reference  LA ID, A-P, ES                           45    mm       ----------  LA ID/bsa, A-P                    (H)    2.22  cm/m^2   <=2.2  LA volume, S  65.2  ml       ----------  LA volume/bsa, S                         32.1  ml/m^2   ----------  LA volume, ES, 1-p A4C                   57.3  ml       ----------  LA volume/bsa, ES, 1-p A4C               28.2  ml/m^2   ----------  LA volume, ES, 1-p A2C                   73.2  ml       ----------  LA volume/bsa, ES, 1-p A2C               36    ml/m^2   ----------    Mitral valve                             Value          Reference  Mitral E-wave peak velocity              86.8  cm/s     ----------  Mitral A-wave peak velocity              114   cm/s     ----------  Mitral deceleration time          (L)    148   ms       150 - 230  Mitral peak gradient, D                  3     mm Hg    ----------  Mitral E/A ratio, peak                   0.8            ----------    Right atrium                             Value          Reference  RA ID, S-I, ES, A4C               (H)    54.4  mm        34 - 49  RA area, ES, A4C                         17.6  cm^2     8.3 - 19.5  RA volume, ES, A/L                       48    ml       ----------  RA volume/bsa, ES, A/L                   23.6  ml/m^2   ----------    Systemic veins                           Value          Reference  Estimated CVP  3     mm Hg    ----------    Right ventricle                          Value          Reference  TAPSE                                    17.1  mm       ----------  RV s&', lateral, S                        13.7  cm/s     ----------  Legend: (L)  and  (H)  mark values outside specified reference range.  ------------------------------------------------------------------- Prepared and Electronically Authenticated by  Donato Schultz, M.D. 2019-01-22T12:40:55  CATH:  RIGHT/LEFT HEART CATH AND CORONARY ANGIOGRAPHY  Conclusion     Mid LM lesion is 50% stenosed.  Prox LAD-1 lesion is 60% stenosed.  Prox LAD-2 lesion is 70% stenosed.  Ost 1st Mrg lesion is 80% stenosed.  1st Mrg lesion is 80% stenosed.  Ost RCA to Prox RCA lesion is 85% stenosed.  Mid RCA to Dist RCA lesion is 30% stenosed.  LV end diastolic pressure is normal.  There is severe aortic valve stenosis.   1. 3 vessel obstructive CAD 2. Normal LV filling pressures. 3. Severe aortic stenosis- mean gradient 46 mm Hg. AV area 0.9 cm squared.  4. Normal right heart pressures. 5. Normal cardiac output.  Plan: check Echo. Refer to CT surgery for consideration of AVR/CABG     I have independently reviewed the above  cath films and reviewed the findings with the  patient .   Chronic Kidney Disease   Stage I     GFR >90  Stage II    GFR 60-89  Stage IIIA GFR 45-59  Stage IIIB GFR 30-44  Stage IV   GFR 15-29  Stage V    GFR  <15  Lab Results  Component Value Date   CREATININE 1.48 (H) 08/12/2017   CrCl cannot be calculated (Patient's most recent lab result is older than the  maximum 21 days allowed.).   Assessment / Plan:   Patient with progressive symptoms of coronary occlusive disease and critical aortic stenosis, with calculated valve area of 0.9 cm, Mean gradient (S): 50 mm Hg. Peak gradient (S): 78 mm Hg.  By echo estimate.  I discussed with the patient and his wife the diagnosis of concomitant three-vessel coronary artery disease in the setting of critical aortic stenosis, symptomatic.  I recommended to the patient that we proceed in a very timely manner to complete his evaluation and proceed with coronary artery bypass grafting and aortic valve replacement.  We discussed the pros and cons of mechanical versus tissue valve replacement.  Patient is agreeable with a tissue valve to avoid lifelong anticoagulation. The goals risks and alternatives of the planned surgical procedure coronary artery bypass grafting and aortic valve replacement have been discussed with the patient in detail. The risks of the procedure including death, infection, stroke, myocardial infarction, bleeding, blood transfusion, worsening of his renal function , heart block and need for permanent pacemaker have all been discussed specifically.  I have quoted Leonard Stakes a 4 % of perioperative mortality and a complication rate as high as 30%. The patient's and  his wife's questions have been answered.Leonard Padilla is willing  to proceed with the planned procedure.  Plan for Friday, February 1.   He has been cautioned to avoid any heavy physical activity until that time. Patient had been on Plavix but this is been held since his heart catheterization, he notes that he has not resumed.  I  spent 60 minutes counseling the patient face to face.   Leonard OvensEdward B Krystol Rocco MD      [address] [place] [id number] Office [phone]   Beeper [phone]  09/03/2017 4:50 PM

## 2017-09-02 NOTE — Patient Instructions (Signed)
Coronary Artery Bypass Grafting, Care After This sheet gives you information about how to care for yourself after your procedure. Your health care provider may also give you more specific instructions. If you have problems or questions, contact your health care provider. What can I expect after the procedure? After the procedure, it is common to have:  Nausea and a lack of appetite.  Constipation.  Weakness and fatigue.  Depression or irritability.  Pain or discomfort in your incision areas.  Follow these instructions at home: Medicines  Take over-the-counter and prescription medicines only as told by your health care provider. Do not stop taking medicines or start any new medicines without approval from your health care provider.  If you were prescribed an antibiotic medicine, take it as told by your health care provider. Do not stop taking the antibiotic even if you start to feel better.  Do not drive or use heavy machinery while taking prescription pain medicine. Incision care  Follow instructions from your health care provider about how to take care of your incisions. Make sure you: ? Wash your hands with soap and water before you change your bandage (dressing). If soap and water are not available, use hand sanitizer. ? Change your dressing as told by your health care provider. ? Leave stitches (sutures), skin glue, or adhesive strips in place. These skin closures may need to stay in place for 2 weeks or longer. If adhesive strip edges start to loosen and curl up, you may trim the loose edges. Do not remove adhesive strips completely unless your health care provider tells you to do that.  Keep incision areas clean, dry, and protected.  Check your incision areas every day for signs of infection. Check for: ? More redness, swelling, or pain. ? More fluid or blood. ? Warmth. ? Pus or a bad smell.  If incisions were made in your legs: ? Avoid crossing your legs. ? Avoid  sitting for long periods of time. Change positions every 30 minutes. ? Raise (elevate) your legs when you are sitting. Bathing  Do not take baths, swim, or use a hot tub until your health care provider approves.  Only take sponge baths. Pat the incisions dry. Do not rub incisions with a washcloth or towel.  Ask your health care provider when you can shower. Eating and drinking  Eat foods that are high in fiber, such as raw fruits and vegetables, whole grains, beans, and nuts. Meats should be lean cut. Avoid canned, processed, and fried foods. This can help prevent constipation and is a recommended part of a heart-healthy diet.  Drink enough fluid to keep your urine clear or pale yellow.  Limit alcohol intake to no more than 1 drink a day for nonpregnant women and 2 drinks a day for men. One drink equals 12 oz of beer, 5 oz of wine, or 1 oz of hard liquor. Activity  Rest and limit your activity as told by your health care provider. You may be instructed to: ? Stop any activity right away if you have chest pain, shortness of breath, irregular heartbeats, or dizziness. Get help right away if you have any of these symptoms. ? Move around frequently for short periods or take short walks as directed by your health care provider. Gradually increase your activities. You may need physical therapy or cardiac rehabilitation to help strengthen your muscles and build your endurance. ? Avoid lifting, pushing, or pulling anything that is heavier than 10 lb (4.5 kg) for  at least 6 weeks or as told by your health care provider.  Do not drive until your health care provider approves.  Ask your health care provider when you may return to work.  Ask your health care provider when you may resume sexual activity. General instructions  Do not use any products that contain nicotine or tobacco, such as cigarettes and e-cigarettes. If you need help quitting, ask your health care provider.  Take 2-3 deep  breaths every few hours during the day, while you recover. This helps expand your lungs and prevent complications like pneumonia after surgery.  If you were given a device called an incentive spirometer, use it several times a day to practice deep breathing. Support your chest with a pillow or your arms when you take deep breaths or cough.  Wear compression stockings as told by your health care provider. These stockings help to prevent blood clots and reduce swelling in your legs.  Weigh yourself every day. This helps identify if your body is holding (retaining) fluid that may make your heart and lungs work harder.  Keep all follow-up visits as told by your health care provider. This is important. Contact a health care provider if:  You have more redness, swelling, or pain around any incision.  You have more fluid or blood coming from any incision.  Any incision feels warm to the touch.  You have pus or a bad smell coming from any incision  You have a fever.  You have swelling in your ankles or legs.  You have pain in your legs.  You gain 2 lb (0.9 kg) or more a day.  You are nauseous or you vomit.  You have diarrhea. Get help right away if:  You have chest pain that spreads to your jaw or arms.  You are short of breath.  You have a fast or irregular heartbeat.  You notice a "clicking" in your breastbone (sternum) when you move.  You have numbness or weakness in your arms or legs.  You feel dizzy or light-headed. Summary  After the procedure, it is common to have pain or discomfort in the incision areas.  Do not take baths, swim, or use a hot tub until your health care provider approves.  Gradually increase your activities. You may need physical therapy or cardiac rehabilitation to help strengthen your muscles and build your endurance.  Weigh yourself every day. This helps identify if your body is holding (retaining) fluid that may make your heart and lungs work  harder. This information is not intended to replace advice given to you by your health care provider. Make sure you discuss any questions you have with your health care provider. Document Released: 02/09/2005 Document Revised: 06/11/2016 Document Reviewed: 06/11/2016 Elsevier Interactive Patient Education  2018 [employer] Aortic Valve Replacement Aortic valve replacement is surgical replacement of an aortic valve that cannot be repaired. This procedure may be done to replace:  A narrow aortic valve (aortic valve stenosis).  A deformed aortic valve (bicuspid aortic valve).  An aortic valve that does not close all the way (aortic insufficiency).  The aortic valve is replaced with artificial (prosthetic) valve. Three types of prosthetic valves are available:  Mechanical valves made entirely from man-made materials.  Donor valves from human donors. These are only used in special situations.  Biological valves made from animal tissues.  The type of prosthetic valve used will be determined based on various factors, including your age, your lifestyle, and other medical conditions  you have. This procedure is done using an open surgical technique, meaning that a large incision will be made in your chest over your heart. Tell a health care provider about:  Any allergies you have.  All medicines you are taking, including vitamins, herbs, eye drops, creams, and over-the-counter medicines.  Any problems you or family members have had with anesthetic medicines.  Any blood disorders you have.  Any surgeries you have had.  Any medical conditions you have.  Whether you are pregnant or may be pregnant. What are the risks? Generally, this is a safe procedure. However, problems may occur, including:  Infection.  Bleeding.  Allergic reactions to medicines.  Damage to other structures or organs.  Blood clotting caused by the prosthetic valve.  Failure of the prosthetic valve.  What  happens before the procedure?  Ask your health care provider about: ? Changing or stopping your regular medicines. This is especially important if you are taking diabetes medicines or blood thinners. ? Taking medicines such as aspirin and ibuprofen. These medicines can thin your blood. Do not take these medicines before your procedure if your health care provider instructs you not to.  Follow instructions from your health care provider about eating or drinking restrictions.  You may be given antibiotic medicine to help prevent infection.  You may have tests, such as: ? Echocardiogram. ? Electrocardiogram (ECG). ? Blood tests. ? Urine tests.  Plan to have someone take you home when you leave the hospital.  Plan to have someone with you for at least 24 hours after you leave the hospital. What happens during the procedure?  To reduce your risk of infection: ? Your health care team will wash or sanitize their hands. ? Your skin will be washed with soap.  An IV tube will be inserted into one of your veins.  You will be given one or more of the following: ? A medicine to help you relax (sedative). ? A medicine to make you fall asleep (general anesthetic).  You will be placed on a heart-lung bypass machine. This machine provides oxygen to your blood while your heart is undergoing surgery.  An incision will be made in your chest, over your heart.  Your damaged aortic valve will be removed.  A prosthetic valve will be sewn into your heart.  Your incision will be closed with stitches (sutures), skin glue, or adhesive tape.  A bandage (dressing) will be placed over your incision. The procedure may vary among health care providers and hospitals. What happens after the procedure?  Your blood pressure, heart rate, breathing rate, and blood oxygen level will be monitored often until the medicines you were given have worn off.  Do not drive until your health care provider  approves. This information is not intended to replace advice given to you by your health care provider. Make sure you discuss any questions you have with your health care provider. Document Released: 12/12/2004 Document Revised: 12/29/2015 Document Reviewed: 06/26/2015 Elsevier Interactive Patient Education  2017 [employer] Aortic Valve Stenosis Aortic valve stenosis is a narrowing of the aortic valve. The aortic valve opens and closes to regulate blood flow between the lower left chamber of the heart (left ventricle) and the blood vessel that leads away from the heart (aorta). When the aortic valve becomes narrow, it makes it difficult for the heart to pump blood into the aorta, which causes the heart to work harder. The extra work can weaken the heart over time. Aortic valve stenosis  can range from mild to severe. If untreated, it can become more severe over time and can lead to heart failure. What are the causes? This condition may be caused by:  Buildup of calcium around and on the valve. This can occur with aging. This is the most common cause of aortic valve stenosis.  Birth defect.  Rheumatic fever.  Radiation to the chest.  What increases the risk? You may be more likely to develop this condition if:  You are over the age of 44.  You were born with an abnormal bicuspid valve.  What are the signs or symptoms? You may have no symptoms until your condition becomes severe. It may take 10-20 years for mild or moderate aortic valve stenosis to become severe. Symptoms may include:  Shortness of breath. This may get worse during physical activity.  Feeling unusually weak and tired (fatigue).  Extreme discomfort in the chest, neck, or arm (angina).  A heartbeat that is irregular or faster than normal (palpitations).  Dizziness or fainting. This may happen when you get physically tired or after you take certain heart medicines, such as nitroglycerin.  How is this  diagnosed? This condition may be diagnosed with:  A physical exam.  Echocardiogram. This is a type of imaging test that uses sound waves (ultrasound) to make an image of your heart. There are two types that may be used: ? Transthoracic echocardiogram (TTE). This type of echocardiogram is noninvasive, and it is usually done first. ? Transesophageal echocardiogram (TEE). This type of echocardiogram is done by passing a flexible tube down your esophagus. The heart and the esophagus are close to each other, so your health care provider can take very clear, detailed pictures of the heart using this type of test.  Cardiac catheterization. In this procedure, a thin, flexible tube (catheter) is passed through a large vein in your neck, groin, or arm. This procedure provides information about arteries, structures, blood pressure, and oxygen levels in your heart.  Electrocardiogram (ECG). This records the electrical impulses of your heart and assesses heart function.  Stress tests. These are tests that evaluate the blood supply to your heart and your heart's response to exercise.  Blood tests.  You may work with a health care provider who specializes in the heart (cardiologist). How is this treated? Treatment depends on how severe your condition is and what your symptoms are. You will need to have your heart checked regularly to make sure that your condition is not getting worse or causing serious problems. If your condition is mild, no treatment may be needed. Treatment may include:  Medicines that help keep your heart rate regular.  Medicines that thin your blood (anticoagulants) to prevent the formation of blood clots.  Antibiotic medicines to help prevent infection.  Surgery to replace your aortic valve. This is the most common treatment for aortic valve stenosis. Several types of surgeries are available. The surgery may be done through a large incision over your heart (open heart surgery), or  it may be done using a minimally invasive technique (transcatheter aortic valve replacement, or TAVR).  Follow these instructions at home: Lifestyle   Limit alcohol intake to no more than 1 drink per day for nonpregnant women and 2 drinks per day for men. One drink equals 12 oz of beer, 5 oz of wine, or 1 oz of hard liquor.  Do not use any tobacco products, such as cigarettes, chewing tobacco, or e-cigarettes. If you need help quitting, ask your health  care provider.  Work with your health care provider to manage your blood pressure and cholesterol.  Maintain a healthy weight. Eating and drinking  Follow instructions from your health care provider about eating or drinking restrictions. ? Limit how much caffeine you drink. Caffeine can affect your heart's rate and rhythm.  Drink enough fluid to keep your urine clear or pale yellow.  Eat a heart-healthy diet. This should include plenty of fresh fruits and vegetables. If you eat meat, it should be lean cuts. Avoid foods that are: ? High in salt, saturated fat, or sugar. ? Canned or highly processed. ? Fried. Activity  Return to your normal activities as told by your health care provider. Ask your health care provider what activities are safe for you.  Exercise regularly, as told by your health care provider. Ask your health care provider what types of exercise are safe for you.  If your aortic valve stenosis is mild, you may need to avoid only very intense physical activity. The more severe your aortic valve stenosis is, the more activities you may need to avoid. General instructions  Take over-the-counter and prescription medicines only as told by your health care provider.  If you are a woman and you plan to become pregnant, talk with your health care provider before you become pregnant.  Tell all health care providers who care for you that you have aortic valve stenosis.  Keep all follow-up visits as told by your health care  provider. This is important. Contact a health care provider if:  You have a fever. Get help right away if:  You develop chest pain or tightness.  You develop shortness of breath or difficulty breathing.  You feel light-headed.  You feel like you might faint.  Your heartbeat is irregular or faster than normal. These symptoms may represent a serious problem that is an emergency. Do not wait to see if the symptoms will go away. Get medical help right away. Call your local emergency services (911 in the [place] Do not drive yourself to the hospital. This information is not intended to replace advice given to you by your health care provider. Make sure you discuss any questions you have with your health care provider. Document Released: 04/21/2003 Document Revised: 12/29/2015 Document Reviewed: 06/26/2015 Elsevier Interactive Patient Education  2017 [employer] Coronary Artery Bypass Grafting, Care After This sheet gives you information about how to care for yourself after your procedure. Your health care provider may also give you more specific instructions. If you have problems or questions, contact your health care provider. What can I expect after the procedure? After the procedure, it is common to have:  Nausea and a lack of appetite.  Constipation.  Weakness and fatigue.  Depression or irritability.  Pain or discomfort in your incision areas.  Follow these instructions at home: Medicines  Take over-the-counter and prescription medicines only as told by your health care provider. Do not stop taking medicines or start any new medicines without approval from your health care provider.  If you were prescribed an antibiotic medicine, take it as told by your health care provider. Do not stop taking the antibiotic even if you start to feel better.  Do not drive or use heavy machinery while taking prescription pain medicine. Incision care  Follow instructions from your health  care provider about how to take care of your incisions. Make sure you: ? Wash your hands with soap and water before you change your bandage (dressing). If soap  and water are not available, use hand sanitizer. ? Change your dressing as told by your health care provider. ? Leave stitches (sutures), skin glue, or adhesive strips in place. These skin closures may need to stay in place for 2 weeks or longer. If adhesive strip edges start to loosen and curl up, you may trim the loose edges. Do not remove adhesive strips completely unless your health care provider tells you to do that.  Keep incision areas clean, dry, and protected.  Check your incision areas every day for signs of infection. Check for: ? More redness, swelling, or pain. ? More fluid or blood. ? Warmth. ? Pus or a bad smell.  If incisions were made in your legs: ? Avoid crossing your legs. ? Avoid sitting for long periods of time. Change positions every 30 minutes. ? Raise (elevate) your legs when you are sitting. Bathing  Do not take baths, swim, or use a hot tub until your health care provider approves.  Only take sponge baths. Pat the incisions dry. Do not rub incisions with a washcloth or towel.  Ask your health care provider when you can shower. Eating and drinking  Eat foods that are high in fiber, such as raw fruits and vegetables, whole grains, beans, and nuts. Meats should be lean cut. Avoid canned, processed, and fried foods. This can help prevent constipation and is a recommended part of a heart-healthy diet.  Drink enough fluid to keep your urine clear or pale yellow.  Limit alcohol intake to no more than 1 drink a day for nonpregnant women and 2 drinks a day for men. One drink equals 12 oz of beer, 5 oz of wine, or 1 oz of hard liquor. Activity  Rest and limit your activity as told by your health care provider. You may be instructed to: ? Stop any activity right away if you have chest pain, shortness of  breath, irregular heartbeats, or dizziness. Get help right away if you have any of these symptoms. ? Move around frequently for short periods or take short walks as directed by your health care provider. Gradually increase your activities. You may need physical therapy or cardiac rehabilitation to help strengthen your muscles and build your endurance. ? Avoid lifting, pushing, or pulling anything that is heavier than 10 lb (4.5 kg) for at least 6 weeks or as told by your health care provider.  Do not drive until your health care provider approves.  Ask your health care provider when you may return to work.  Ask your health care provider when you may resume sexual activity. General instructions  Do not use any products that contain nicotine or tobacco, such as cigarettes and e-cigarettes. If you need help quitting, ask your health care provider.  Take 2-3 deep breaths every few hours during the day, while you recover. This helps expand your lungs and prevent complications like pneumonia after surgery.  If you were given a device called an incentive spirometer, use it several times a day to practice deep breathing. Support your chest with a pillow or your arms when you take deep breaths or cough.  Wear compression stockings as told by your health care provider. These stockings help to prevent blood clots and reduce swelling in your legs.  Weigh yourself every day. This helps identify if your body is holding (retaining) fluid that may make your heart and lungs work harder.  Keep all follow-up visits as told by your health care provider. This is important. Contact  a health care provider if:  You have more redness, swelling, or pain around any incision.  You have more fluid or blood coming from any incision.  Any incision feels warm to the touch.  You have pus or a bad smell coming from any incision  You have a fever.  You have swelling in your ankles or legs.  You have pain in your  legs.  You gain 2 lb (0.9 kg) or more a day.  You are nauseous or you vomit.  You have diarrhea. Get help right away if:  You have chest pain that spreads to your jaw or arms.  You are short of breath.  You have a fast or irregular heartbeat.  You notice a "clicking" in your breastbone (sternum) when you move.  You have numbness or weakness in your arms or legs.  You feel dizzy or light-headed. Summary  After the procedure, it is common to have pain or discomfort in the incision areas.  Do not take baths, swim, or use a hot tub until your health care provider approves.  Gradually increase your activities. You may need physical therapy or cardiac rehabilitation to help strengthen your muscles and build your endurance.  Weigh yourself every day. This helps identify if your body is holding (retaining) fluid that may make your heart and lungs work harder. This information is not intended to replace advice given to you by your health care provider. Make sure you discuss any questions you have with your health care provider. Document Released: 02/09/2005 Document Revised: 06/11/2016 Document Reviewed: 06/11/2016 Elsevier Interactive Patient Education  [employer].

## 2017-09-03 ENCOUNTER — Other Ambulatory Visit: Payer: Self-pay | Admitting: *Deleted

## 2017-09-03 DIAGNOSIS — I2510 Atherosclerotic heart disease of native coronary artery without angina pectoris: Secondary | ICD-10-CM

## 2017-09-03 DIAGNOSIS — I350 Nonrheumatic aortic (valve) stenosis: Secondary | ICD-10-CM

## 2017-09-04 ENCOUNTER — Other Ambulatory Visit: Payer: Self-pay

## 2017-09-04 ENCOUNTER — Ambulatory Visit (HOSPITAL_COMMUNITY)
Admission: RE | Admit: 2017-09-04 | Discharge: 2017-09-04 | Disposition: A | Discharge status: Home / Self Care | Payer: Medicare Other | Source: Ambulatory Visit | Attending: Cardiothoracic Surgery | Admitting: Cardiothoracic Surgery

## 2017-09-04 ENCOUNTER — Encounter (HOSPITAL_COMMUNITY): Payer: Self-pay | Admitting: Anesthesiology

## 2017-09-04 ENCOUNTER — Encounter (HOSPITAL_COMMUNITY)
Admission: RE | Admit: 2017-09-04 | Discharge: 2017-09-04 | Disposition: A | Discharge status: Home / Self Care | Payer: Medicare Other | Source: Ambulatory Visit | Attending: Cardiothoracic Surgery | Admitting: Cardiothoracic Surgery

## 2017-09-04 ENCOUNTER — Encounter (HOSPITAL_COMMUNITY): Payer: Self-pay

## 2017-09-04 ENCOUNTER — Encounter (HOSPITAL_COMMUNITY): Payer: Self-pay | Admitting: Emergency Medicine

## 2017-09-04 DIAGNOSIS — E785 Hyperlipidemia, unspecified: Secondary | ICD-10-CM | POA: Diagnosis not present

## 2017-09-04 DIAGNOSIS — I129 Hypertensive chronic kidney disease with stage 1 through stage 4 chronic kidney disease, or unspecified chronic kidney disease: Secondary | ICD-10-CM | POA: Insufficient documentation

## 2017-09-04 DIAGNOSIS — N183 Chronic kidney disease, stage 3 (moderate): Secondary | ICD-10-CM | POA: Insufficient documentation

## 2017-09-04 DIAGNOSIS — I2510 Atherosclerotic heart disease of native coronary artery without angina pectoris: Secondary | ICD-10-CM | POA: Diagnosis present

## 2017-09-04 DIAGNOSIS — I350 Nonrheumatic aortic (valve) stenosis: Secondary | ICD-10-CM | POA: Insufficient documentation

## 2017-09-04 HISTORY — DX: Presence of spectacles and contact lenses: Z97.3

## 2017-09-04 HISTORY — DX: Atherosclerotic heart disease of native coronary artery without angina pectoris: I25.10

## 2017-09-04 LAB — PULMONARY FUNCTION TEST
DL/VA % pred: 78 %
DL/VA: 3.55 ml/min/mmHg/L
DLCO cor % pred: 64 %
DLCO cor: 19.52 ml/min/mmHg
DLCO unc % pred: 61 %
DLCO unc: 18.7 ml/min/mmHg
FEF 25-75 Post: 4.36 L/sec
FEF 25-75 Pre: 4.08 L/sec
FEF2575-%Change-Post: 7 %
FEF2575-%Pred-Post: 200 %
FEF2575-%Pred-Pre: 186 %
FEV1-%Change-Post: 1 %
FEV1-%Pred-Post: 107 %
FEV1-%Pred-Pre: 106 %
FEV1-Post: 3.16 L
FEV1-Pre: 3.13 L
FEV1FVC-%Change-Post: 2 %
FEV1FVC-%Pred-Pre: 117 %
FEV6-%Change-Post: -1 %
FEV6-%Pred-Post: 93 %
FEV6-%Pred-Pre: 95 %
FEV6-Post: 3.57 L
FEV6-Pre: 3.63 L
FEV6FVC-%Pred-Post: 106 %
FEV6FVC-%Pred-Pre: 106 %
FVC-%Change-Post: -1 %
FVC-%Pred-Post: 87 %
FVC-%Pred-Pre: 89 %
FVC-Post: 3.57 L
FVC-Pre: 3.63 L
Post FEV1/FVC ratio: 89 %
Post FEV6/FVC ratio: 100 %
Pre FEV1/FVC ratio: 86 %
Pre FEV6/FVC Ratio: 100 %
RV % pred: 338 %
RV: 8.22 L
TLC % pred: 180 %
TLC: 12.15 L

## 2017-09-04 LAB — COMPREHENSIVE METABOLIC PANEL
ALT: 25 U/L (ref 17–63)
AST: 24 U/L (ref 15–41)
Albumin: 4.1 g/dL (ref 3.5–5.0)
Alkaline Phosphatase: 71 U/L (ref 38–126)
Anion gap: 12 (ref 5–15)
BUN: 27 mg/dL — ABNORMAL HIGH (ref 6–20)
CO2: 19 mmol/L — ABNORMAL LOW (ref 22–32)
Calcium: 9.5 mg/dL (ref 8.9–10.3)
Chloride: 103 mmol/L (ref 101–111)
Creatinine, Ser: 1.21 mg/dL (ref 0.61–1.24)
GFR calc Af Amer: >60 mL/min (ref >60)
GFR calc non Af Amer: 58 mL/min — ABNORMAL LOW (ref >60)
Glucose, Bld: 96 mg/dL (ref 65–99)
Potassium: 4.1 mmol/L (ref 3.5–5.1)
Sodium: 134 mmol/L — ABNORMAL LOW (ref 135–145)
Total Bilirubin: 0.7 mg/dL (ref 0.3–1.2)
Total Protein: 7 g/dL (ref 6.5–8.1)

## 2017-09-04 LAB — URINALYSIS, ROUTINE W REFLEX MICROSCOPIC
Bilirubin Urine: NEGATIVE
Glucose, UA: NEGATIVE mg/dL
Hgb urine dipstick: NEGATIVE
Ketones, ur: NEGATIVE mg/dL
Leukocytes, UA: NEGATIVE
Nitrite: NEGATIVE
Protein, ur: NEGATIVE mg/dL
Specific Gravity, Urine: 1.014 (ref 1.005–1.030)
pH: 5 (ref 5.0–8.0)

## 2017-09-04 LAB — CBC
HCT: 39.9 % (ref 39.0–52.0)
Hemoglobin: 13.2 g/dL (ref 13.0–17.0)
MCH: 31.1 pg (ref 26.0–34.0)
MCHC: 33.1 g/dL (ref 30.0–36.0)
MCV: 94.1 fL (ref 78.0–100.0)
Platelets: 136 10*3/uL — ABNORMAL LOW (ref 150–400)
RBC: 4.24 MIL/uL (ref 4.22–5.81)
RDW: 12.5 % (ref 11.5–15.5)
WBC: 7.2 10*3/uL (ref 4.0–10.5)

## 2017-09-04 LAB — SURGICAL PCR SCREEN
MRSA, PCR: NEGATIVE
Staphylococcus aureus: POSITIVE — AB

## 2017-09-04 LAB — BLOOD GAS, ARTERIAL
Acid-Base Excess: 0.9 mmol/L (ref 0.0–2.0)
Bicarbonate: 24.7 mmol/L (ref 20.0–28.0)
Drawn by: 470591
FIO2: 21
O2 Saturation: 97.9 %
Patient temperature: 98.6
pCO2 arterial: 37.4 mmHg (ref 32.0–48.0)
pH, Arterial: 7.436 (ref 7.350–7.450)
pO2, Arterial: 108 mmHg (ref 83.0–108.0)

## 2017-09-04 LAB — PROTIME-INR
INR: 1
Prothrombin Time: 13.1 seconds (ref 11.4–15.2)

## 2017-09-04 LAB — APTT: aPTT: 23 seconds — ABNORMAL LOW (ref 24–36)

## 2017-09-04 LAB — HEMOGLOBIN A1C
Hgb A1c MFr Bld: 5.5 % (ref 4.8–5.6)
Mean Plasma Glucose: 111.15 mg/dL

## 2017-09-04 MED ORDER — ALBUTEROL SULFATE (2.5 MG/3ML) 0.083% IN NEBU
2.5000 mg | INHALATION_SOLUTION | Freq: Once | RESPIRATORY_TRACT | Status: AC
  Administered 2017-09-04: 2.5 mg via RESPIRATORY_TRACT

## 2017-09-04 NOTE — Progress Notes (Signed)
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 1% to 39% ICA stenosis. ABIs indicate a mild reduction in arterial flow at rest. Palmar arch is normal. Toma DeitersVirginia Tivon Lemoine, RVS 09/04/2017 3:24 PM

## 2017-09-04 NOTE — Progress Notes (Signed)
Pt denies any acute cardiopulmonary issues. Pt under the care of Dr. Tomie Chinaevankar, Cardiology. Pt stated that last dose of Plavix was 08/23/17 as instructed. Pt stated that a stress test was performed > 10 years ago. Alycia Rossettiyan, RN at MD's office, stated that pt should not take Aspirin DOS. Anesthesia asked to review pt history (see note).

## 2017-09-04 NOTE — Progress Notes (Signed)
Anesthesia Chart Review:  Pt is a 72 year old male scheduled for CABG, aortic valve replacement on 09/06/2017 with Sheliah PlaneEdward Gerhardt, MD  History includes CAD, HTN, HLD, psoriasis, aortic stenosis, OSA, GERD, arthritis, CVA 09/2015 (while visiting family in [place]). Former smoker. BMI 27. S/p R CEA 12/15/15   - PCP is Gena Frayharley Cummings, PA. - Cardiologist is Belva Cromeajan Revankar, MD   Meds include ASA 81mg , Lipitor, candesartan, Plavix, HCTZ.  Plavix has been held since cardiac cath 08/23/17.   BP (!) 107/58   Pulse 77   Temp 36.9 C   Resp 18   Ht 5' 8.5" (1.74 m)   Wt 182 lb 9.6 oz (82.8 kg)   SpO2 100%   BMI 27.36 kg/m    Preoperative labs reviewed.    CXR 09/04/17: No active cardiopulmonary disease.  EKG 08/12/17: NSR. Inferior infarct, age undetermined.   Pre-CABG US 09/04/17: Preliminary report shows:  - Bilateral pre Operative CABG and AVR Dopplers completed.  - 1% to 39% ICA stenosis. ABIs indicate a mild reduction in arterial flow at rest. Palmar arch is normal.  Echo 08/27/17:  - Left ventricle: The cavity size was normal. There was mild focal basal hypertrophy of the septum. Systolic function was vigorous. The estimated ejection fraction was in the range of 65% to 70%. Wall motion was normal; there were no regional wall motion abnormalities. Doppler parameters are consistent with abnormal left ventricular relaxation (grade 1 diastolic dysfunction). - Aortic valve: Valve mobility was restricted. There was severe stenosis. There was mild regurgitation. Peak velocity (S): 442 cm/s. Mean gradient (S): 50 mm Hg. Valve area (VTI): 0.82 cm^2. Valve area (Vmax): 0.73 cm^2. Valve area (Vmean): 0.76 cm^2. - Mitral valve: There was mild regurgitation.  Cardiac cath 08/23/17:   Mid LM lesion is 50% stenosed.  Prox LAD-1 lesion is 60% stenosed.  Prox LAD-2 lesion is 70% stenosed.  Ost 1st Mrg lesion is 80% stenosed.  1st Mrg lesion is 80% stenosed.  Ost RCA to Prox RCA lesion is 85%  stenosed.  Mid RCA to Dist RCA lesion is 30% stenosed.  LV end diastolic pressure is normal.  There is severe aortic valve stenosis. 1. 3 vessel obstructive CAD 2. Normal LV filling pressures. 3. Severe aortic stenosis- mean gradient 46 mm Hg. AV area 0.9 cm squared.  4. Normal right heart pressures. 5. Normal cardiac output.  Aorta US 05/07/17:  - Aortic atherosclerosis with minor ectasia proximally. No significant aneurysm. Maximal diameter 2.6 cm. - Ectatic abdominal aorta at risk for aneurysm development. Recommend followup by ultrasound in 5 years.   If no changes, I anticipate pt can proceed with surgery as scheduled.   Leonard Mastngela Geremiah Fussell, FNP-BC [facility]An[facility]hesiology Phone: [phone] 09/04/2017 3:33 PM

## 2017-09-04 NOTE — Pre-Procedure Instructions (Signed)
    Leonard Padilla  09/04/2017      [facility] [phone] - [place] - [address] [facility] [address] [place] [id number] Phone: [phone] Fax: [phone]    Your procedure is scheduled on Friday, September 06, 2017  [facility] Admitting at 5:30 A.M.  Call this number if you have problems the morning of surgery:  [phone]   Remember: Follow doctors instructions regarding clopidogrel (PLAVIX) and Aspirin.  Do not eat food or drink liquids after midnight Thursday, September 05, 2017  Take these medicines the morning of surgery with A SIP OF WATER : sertraline (ZOLOFT),  tamsulosin (FLOMAX),   IF NEEDED: nitroGLYCERIN (NITROSTAT) for chest pain Stop taking vitamins, fish oil and herbal medications. Do not take any NSAIDs ie: Ibuprofen, Advil, Naproxen (Aleve), Motrin, BC and Goody Powder; stop now.  Do not wear jewelry, make-up or nail polish.  Do not wear lotions, powders, or perfumes, or deodorant.  Do not shave 48 hours prior to surgery.  Men may shave face and neck.  Do not bring valuables to the hospital.  [facility] is not responsible for any belongings or valuables.  Contacts, dentures or bridgework may not be worn into surgery.  Leave your suitcase in the car.  After surgery it may be brought to your room. For patients admitted to the hospital, discharge time will be determined by your treatment team. Special instructions: Shower the night before surgery and the morning of surgery with CHG.  Please read over the following fact sheets that you were given. Pain Booklet, Coughing and Deep Breathing, Blood Transfusion Information, MRSA Information and Surgical Site Infection Prevention

## 2017-09-05 MED ORDER — METOPROLOL TARTRATE 12.5 MG HALF TABLET: 12.5000 mg | ORAL_TABLET | Freq: Once | ORAL | Status: DC

## 2017-09-05 MED ORDER — SODIUM CHLORIDE 0.9 % IV SOLN
INTRAVENOUS | Status: DC
  Filled 2017-09-05: qty 30

## 2017-09-05 MED ORDER — PLASMA-LYTE 148 IV SOLN
INTRAVENOUS | Status: DC
  Filled 2017-09-05: qty 2.5

## 2017-09-05 MED ORDER — MILRINONE LACTATE IN DEXTROSE 20-5 MG/100ML-% IV SOLN
0.1250 ug/kg/min | INTRAVENOUS | Status: DC
  Filled 2017-09-05: qty 100

## 2017-09-05 MED ORDER — DEXTROSE 5 % IV SOLN
750.0000 mg | INTRAVENOUS | Status: DC
  Filled 2017-09-05: qty 750

## 2017-09-05 MED ORDER — INSULIN REGULAR HUMAN 100 UNIT/ML IJ SOLN
INTRAMUSCULAR | Status: DC
  Filled 2017-09-05: qty 1

## 2017-09-05 MED ORDER — DEXMEDETOMIDINE HCL IN NACL 400 MCG/100ML IV SOLN
0.1000 ug/kg/h | INTRAVENOUS | Status: DC
  Filled 2017-09-05: qty 100

## 2017-09-05 MED ORDER — SODIUM CHLORIDE 0.9 % IV SOLN
30.0000 ug/min | INTRAVENOUS | Status: DC
  Filled 2017-09-05: qty 2

## 2017-09-05 MED ORDER — NITROGLYCERIN IN D5W 200-5 MCG/ML-% IV SOLN
2.0000 ug/min | INTRAVENOUS | Status: DC
  Filled 2017-09-05: qty 250

## 2017-09-05 MED ORDER — SODIUM CHLORIDE 0.9 % IV SOLN
1250.0000 mg | INTRAVENOUS | Status: DC
  Filled 2017-09-05: qty 1250

## 2017-09-05 MED ORDER — TRANEXAMIC ACID (OHS) PUMP PRIME SOLUTION
2.0000 mg/kg | INTRAVENOUS | Status: DC
  Filled 2017-09-05: qty 1.66

## 2017-09-05 MED ORDER — CHLORHEXIDINE GLUCONATE 0.12 % MT SOLN: 15.0000 mL | Freq: Once | OROMUCOSAL | Status: DC

## 2017-09-05 MED ORDER — POTASSIUM CHLORIDE 2 MEQ/ML IV SOLN
80.0000 meq | INTRAVENOUS | Status: DC
  Filled 2017-09-05: qty 40

## 2017-09-05 MED ORDER — DOPAMINE-DEXTROSE 3.2-5 MG/ML-% IV SOLN
0.0000 ug/kg/min | INTRAVENOUS | Status: DC
  Filled 2017-09-05: qty 250

## 2017-09-05 MED ORDER — MAGNESIUM SULFATE 50 % IJ SOLN
40.0000 meq | INTRAMUSCULAR | Status: DC
  Filled 2017-09-05: qty 9.85

## 2017-09-05 MED ORDER — TRANEXAMIC ACID 1000 MG/10ML IV SOLN
1.5000 mg/kg/h | INTRAVENOUS | Status: DC
  Filled 2017-09-05: qty 25

## 2017-09-05 MED ORDER — EPINEPHRINE PF 1 MG/ML IJ SOLN
0.0000 ug/min | INTRAVENOUS | Status: DC
  Filled 2017-09-05: qty 4

## 2017-09-05 MED ORDER — TRANEXAMIC ACID (OHS) BOLUS VIA INFUSION
15.0000 mg/kg | INTRAVENOUS | Status: DC
  Filled 2017-09-05: qty 1242

## 2017-09-05 MED ORDER — DEXTROSE 5 % IV SOLN
1.5000 g | INTRAVENOUS | Status: DC
  Filled 2017-09-05: qty 1.5

## 2017-09-06 ENCOUNTER — Ambulatory Visit (HOSPITAL_COMMUNITY): Payer: Medicare Other

## 2017-09-06 ENCOUNTER — Inpatient Hospital Stay (HOSPITAL_COMMUNITY): Admission: RE | Admit: 2017-09-06 | Payer: Medicare Other | Source: Ambulatory Visit | Admitting: Cardiothoracic Surgery

## 2017-09-06 ENCOUNTER — Encounter (HOSPITAL_COMMUNITY): Admission: RE | Payer: Self-pay | Source: Ambulatory Visit

## 2017-09-06 ENCOUNTER — Other Ambulatory Visit: Payer: Self-pay | Admitting: *Deleted

## 2017-09-06 DIAGNOSIS — I350 Nonrheumatic aortic (valve) stenosis: Secondary | ICD-10-CM

## 2017-09-06 SURGERY — CORONARY ARTERY BYPASS GRAFTING (CABG)
Anesthesia: General | Site: Chest

## 2017-09-08 MED ORDER — VANCOMYCIN HCL 10 G IV SOLR
1250.0000 mg | INTRAVENOUS | Status: AC
  Administered 2017-09-09: 1250 mg via INTRAVENOUS
  Filled 2017-09-08: qty 1250

## 2017-09-08 MED ORDER — CEFUROXIME SODIUM 1.5 G IV SOLR
1.5000 g | INTRAVENOUS | Status: AC
  Administered 2017-09-09: 1.5 g via INTRAVENOUS
  Filled 2017-09-08: qty 1.5

## 2017-09-08 MED ORDER — SODIUM CHLORIDE 0.9 % IV SOLN
INTRAVENOUS | Status: AC
  Administered 2017-09-09: .6 [IU]/h via INTRAVENOUS
  Filled 2017-09-08: qty 1

## 2017-09-08 MED ORDER — DEXMEDETOMIDINE HCL IN NACL 400 MCG/100ML IV SOLN
0.1000 ug/kg/h | INTRAVENOUS | Status: AC
  Administered 2017-09-09: .2 ug/kg/h via INTRAVENOUS
  Filled 2017-09-08: qty 100

## 2017-09-08 MED ORDER — TRANEXAMIC ACID 1000 MG/10ML IV SOLN
1.5000 mg/kg/h | INTRAVENOUS | Status: AC
  Administered 2017-09-09: 1.5 mg/kg/h via INTRAVENOUS
  Filled 2017-09-08: qty 25

## 2017-09-08 MED ORDER — DOPAMINE-DEXTROSE 3.2-5 MG/ML-% IV SOLN
0.0000 ug/kg/min | INTRAVENOUS | Status: AC
  Administered 2017-09-09: 3 ug/kg/min via INTRAVENOUS
  Filled 2017-09-08: qty 250

## 2017-09-08 MED ORDER — NITROGLYCERIN IN D5W 200-5 MCG/ML-% IV SOLN
2.0000 ug/min | INTRAVENOUS | Status: AC
  Administered 2017-09-09: 5 ug/min via INTRAVENOUS
  Filled 2017-09-08: qty 250

## 2017-09-08 MED ORDER — MAGNESIUM SULFATE 50 % IJ SOLN
40.0000 meq | INTRAMUSCULAR | Status: DC
  Filled 2017-09-08: qty 9.85

## 2017-09-08 MED ORDER — PLASMA-LYTE 148 IV SOLN
INTRAVENOUS | Status: DC
  Filled 2017-09-08: qty 2.5

## 2017-09-08 MED ORDER — TRANEXAMIC ACID (OHS) BOLUS VIA INFUSION
15.0000 mg/kg | INTRAVENOUS | Status: AC
  Administered 2017-09-09: 1242 mg via INTRAVENOUS
  Filled 2017-09-08: qty 1242

## 2017-09-08 MED ORDER — POTASSIUM CHLORIDE 2 MEQ/ML IV SOLN
80.0000 meq | INTRAVENOUS | Status: DC
  Filled 2017-09-08: qty 40

## 2017-09-08 MED ORDER — EPINEPHRINE PF 1 MG/ML IJ SOLN
0.0000 ug/min | INTRAVENOUS | Status: DC
  Filled 2017-09-08: qty 4

## 2017-09-08 MED ORDER — PHENYLEPHRINE HCL 10 MG/ML IJ SOLN
30.0000 ug/min | INTRAMUSCULAR | Status: DC
  Administered 2017-09-09: 20 ug/min via INTRAVENOUS
  Filled 2017-09-08: qty 2

## 2017-09-08 MED ORDER — TRANEXAMIC ACID (OHS) PUMP PRIME SOLUTION
2.0000 mg/kg | INTRAVENOUS | Status: DC
  Filled 2017-09-08: qty 1.66

## 2017-09-08 MED ORDER — DEXTROSE 5 % IV SOLN
750.0000 mg | INTRAVENOUS | Status: DC
  Filled 2017-09-08: qty 750

## 2017-09-08 MED ORDER — SODIUM CHLORIDE 0.9 % IV SOLN
INTRAVENOUS | Status: DC
  Filled 2017-09-08: qty 30

## 2017-09-08 NOTE — Anesthesia Preprocedure Evaluation (Addendum)
Anesthesia Evaluation    Airway Mallampati: II  TM Distance: >3 FB Neck ROM: Full    Dental no notable dental hx.    Pulmonary former smoker,    Pulmonary exam normal breath sounds clear to auscultation       Cardiovascular hypertension, Pt. on medications + CAD   Rhythm:Regular Rate:Normal + Systolic murmurs ECG: NSR, rate 64  CATH Mid LM lesion is 50% stenosed. Prox LAD-1 lesion is 60% stenosed. Prox LAD-2 lesion is 70% stenosed. Ost 1st Mrg lesion is 80% stenosed. 1st Mrg lesion is 80% stenosed. Ost RCA to Prox RCA lesion is 85% stenosed. Mid RCA to Dist RCA lesion is 30% stenosed. LV end diastolic pressure is normal. There is severe aortic valve stenosis.   1. 3 vessel obstructive CAD 2. Normal LV filling pressures. 3. Severe aortic stenosis- mean gradient 46 mm Hg. AV area 0.9 cm squared.  4. Normal right heart pressures. 5. Normal cardiac output.  ECHO Left ventricle: The cavity size was normal. There was mild focal basal hypertrophy of the septum. Systolic function was vigorous. The estimated ejection fraction was in the range of 65% to 70%. Wall motion was normal; there were no regional wall motion abnormalities. Doppler parameters are consistent with abnormal left ventricular relaxation (grade 1 diastolic dysfunction). Aortic valve: Valve mobility was restricted. There was severe stenosis. There was mild regurgitation. Peak velocity (S): 442 cm/s. Mean gradient (S): 50 mm Hg. Valve area (VTI): 0.82 cm^2. Valve area (Vmax): 0.73 cm^2. Valve area (Vmean): 0.76 cm^2. Mitral valve: There was mild regurgitation.    Neuro/Psych PSYCHIATRIC DISORDERS Depression CVA, No Residual Symptoms    GI/Hepatic negative GI ROS, Neg liver ROS,   Endo/Other  negative endocrine ROS  Renal/GU negative Renal ROS     Musculoskeletal   Abdominal   Peds  Hematology HLD   Anesthesia Other Findings Right lower lip ulcer  Reproductive/Obstetrics                            Anesthesia Physical  Anesthesia Plan  ASA: IV  Anesthesia Plan: General   Post-op Pain Management:    Induction: Intravenous  PONV Risk Score and Plan: 2 and Ondansetron, Midazolam and Treatment may vary due to age or medical condition  Airway Management Planned: Oral ETT  Additional Equipment: Arterial line, CVP, PA Cath, TEE and Ultrasound Guidance Line Placement  Intra-op Plan:   Post-operative Plan: Post-operative intubation/ventilation  Informed Consent: I have reviewed the patients History and Physical, chart, labs and discussed the procedure including the risks, benefits and alternatives for the proposed anesthesia with the patient or authorized representative who has indicated his/her understanding and acceptance.   Dental advisory given  Plan Discussed with: CRNA  Anesthesia Plan Comments:         Anesthesia Quick Evaluation

## 2017-09-09 ENCOUNTER — Encounter (HOSPITAL_COMMUNITY)
Admission: RE | Disposition: A | Discharge status: Home / Self Care | Payer: Self-pay | Source: Ambulatory Visit | Attending: Cardiothoracic Surgery

## 2017-09-09 ENCOUNTER — Inpatient Hospital Stay (HOSPITAL_COMMUNITY): Payer: Medicare Other

## 2017-09-09 ENCOUNTER — Inpatient Hospital Stay (HOSPITAL_COMMUNITY): Payer: Medicare Other | Admitting: Anesthesiology

## 2017-09-09 ENCOUNTER — Encounter (HOSPITAL_COMMUNITY): Payer: Self-pay | Admitting: *Deleted

## 2017-09-09 ENCOUNTER — Other Ambulatory Visit: Payer: Self-pay

## 2017-09-09 ENCOUNTER — Inpatient Hospital Stay (HOSPITAL_COMMUNITY)
Admission: RE | Admit: 2017-09-09 | Discharge: 2017-09-19 | DRG: 220 | Disposition: A | Discharge status: Home / Self Care | Payer: Medicare Other | Source: Ambulatory Visit | Attending: Cardiothoracic Surgery | Admitting: Cardiothoracic Surgery

## 2017-09-09 DIAGNOSIS — R55 Syncope and collapse: Secondary | ICD-10-CM | POA: Diagnosis not present

## 2017-09-09 DIAGNOSIS — I129 Hypertensive chronic kidney disease with stage 1 through stage 4 chronic kidney disease, or unspecified chronic kidney disease: Secondary | ICD-10-CM | POA: Diagnosis not present

## 2017-09-09 DIAGNOSIS — Z888 Allergy status to other drugs, medicaments and biological substances status: Secondary | ICD-10-CM | POA: Diagnosis not present

## 2017-09-09 DIAGNOSIS — E785 Hyperlipidemia, unspecified: Secondary | ICD-10-CM | POA: Diagnosis present

## 2017-09-09 DIAGNOSIS — I350 Nonrheumatic aortic (valve) stenosis: Secondary | ICD-10-CM

## 2017-09-09 DIAGNOSIS — E1122 Type 2 diabetes mellitus with diabetic chronic kidney disease: Secondary | ICD-10-CM | POA: Diagnosis not present

## 2017-09-09 DIAGNOSIS — Z8673 Personal history of transient ischemic attack (TIA), and cerebral infarction without residual deficits: Secondary | ICD-10-CM | POA: Diagnosis not present

## 2017-09-09 DIAGNOSIS — Z87891 Personal history of nicotine dependence: Secondary | ICD-10-CM | POA: Diagnosis not present

## 2017-09-09 DIAGNOSIS — Z09 Encounter for follow-up examination after completed treatment for conditions other than malignant neoplasm: Secondary | ICD-10-CM

## 2017-09-09 DIAGNOSIS — I25110 Atherosclerotic heart disease of native coronary artery with unstable angina pectoris: Secondary | ICD-10-CM | POA: Diagnosis present

## 2017-09-09 DIAGNOSIS — G4089 Other seizures: Secondary | ICD-10-CM | POA: Diagnosis not present

## 2017-09-09 DIAGNOSIS — I4891 Unspecified atrial fibrillation: Secondary | ICD-10-CM | POA: Diagnosis present

## 2017-09-09 DIAGNOSIS — N401 Enlarged prostate with lower urinary tract symptoms: Secondary | ICD-10-CM | POA: Diagnosis present

## 2017-09-09 DIAGNOSIS — N138 Other obstructive and reflux uropathy: Secondary | ICD-10-CM | POA: Diagnosis present

## 2017-09-09 DIAGNOSIS — Z952 Presence of prosthetic heart valve: Secondary | ICD-10-CM

## 2017-09-09 DIAGNOSIS — Z9689 Presence of other specified functional implants: Secondary | ICD-10-CM

## 2017-09-09 DIAGNOSIS — Z951 Presence of aortocoronary bypass graft: Secondary | ICD-10-CM

## 2017-09-09 DIAGNOSIS — D696 Thrombocytopenia, unspecified: Secondary | ICD-10-CM | POA: Diagnosis not present

## 2017-09-09 DIAGNOSIS — N183 Chronic kidney disease, stage 3 (moderate): Secondary | ICD-10-CM | POA: Diagnosis not present

## 2017-09-09 DIAGNOSIS — R569 Unspecified convulsions: Secondary | ICD-10-CM | POA: Diagnosis not present

## 2017-09-09 DIAGNOSIS — I4581 Long QT syndrome: Secondary | ICD-10-CM | POA: Diagnosis not present

## 2017-09-09 DIAGNOSIS — D62 Acute posthemorrhagic anemia: Secondary | ICD-10-CM | POA: Diagnosis not present

## 2017-09-09 DIAGNOSIS — R338 Other retention of urine: Secondary | ICD-10-CM | POA: Diagnosis not present

## 2017-09-09 DIAGNOSIS — I25119 Atherosclerotic heart disease of native coronary artery with unspecified angina pectoris: Secondary | ICD-10-CM | POA: Diagnosis not present

## 2017-09-09 DIAGNOSIS — G4730 Sleep apnea, unspecified: Secondary | ICD-10-CM | POA: Diagnosis present

## 2017-09-09 HISTORY — PX: TEE WITHOUT CARDIOVERSION: SHX5443

## 2017-09-09 HISTORY — PX: AORTIC VALVE REPLACEMENT: SHX41

## 2017-09-09 HISTORY — DX: Presence of prosthetic heart valve: Z95.2

## 2017-09-09 HISTORY — PX: CORONARY ARTERY BYPASS GRAFT: SHX141

## 2017-09-09 LAB — POCT I-STAT 3, ART BLOOD GAS (G3+)
Acid-Base Excess: 3 mmol/L — ABNORMAL HIGH (ref 0.0–2.0)
Acid-base deficit: 2 mmol/L (ref 0.0–2.0)
Acid-base deficit: 3 mmol/L — ABNORMAL HIGH (ref 0.0–2.0)
Acid-base deficit: 5 mmol/L — ABNORMAL HIGH (ref 0.0–2.0)
Acid-base deficit: 4 mmol/L — ABNORMAL HIGH (ref 0.0–2.0)
Bicarbonate: 26.6 mmol/L (ref 20.0–28.0)
Bicarbonate: 23.2 mmol/L (ref 20.0–28.0)
Bicarbonate: 21.2 mmol/L (ref 20.0–28.0)
Bicarbonate: 19.5 mmol/L — ABNORMAL LOW (ref 20.0–28.0)
Bicarbonate: 21.3 mmol/L (ref 20.0–28.0)
O2 Saturation: 100 %
O2 Saturation: 100 %
O2 Saturation: 96 %
O2 Saturation: 99 %
O2 Saturation: 99 %
Patient temperature: 35.4
Patient temperature: 37.1
Patient temperature: 37
TCO2: 28 mmol/L (ref 22–32)
TCO2: 24 mmol/L (ref 22–32)
TCO2: 22 mmol/L (ref 22–32)
TCO2: 21 mmol/L — ABNORMAL LOW (ref 22–32)
TCO2: 23 mmol/L (ref 22–32)
pCO2 arterial: 37.4 mmHg (ref 32.0–48.0)
pCO2 arterial: 41.6 mmHg (ref 32.0–48.0)
pCO2 arterial: 31.7 mmHg — ABNORMAL LOW (ref 32.0–48.0)
pCO2 arterial: 33.3 mmHg (ref 32.0–48.0)
pCO2 arterial: 39.7 mmHg (ref 32.0–48.0)
pH, Arterial: 7.461 — ABNORMAL HIGH (ref 7.350–7.450)
pH, Arterial: 7.354 (ref 7.350–7.450)
pH, Arterial: 7.428 (ref 7.350–7.450)
pH, Arterial: 7.376 (ref 7.350–7.450)
pH, Arterial: 7.338 — ABNORMAL LOW (ref 7.350–7.450)
pO2, Arterial: 415 mmHg — ABNORMAL HIGH (ref 83.0–108.0)
pO2, Arterial: 178 mmHg — ABNORMAL HIGH (ref 83.0–108.0)
pO2, Arterial: 74 mmHg — ABNORMAL LOW (ref 83.0–108.0)
pO2, Arterial: 140 mmHg — ABNORMAL HIGH (ref 83.0–108.0)
pO2, Arterial: 142 mmHg — ABNORMAL HIGH (ref 83.0–108.0)

## 2017-09-09 LAB — ECHO TEE
AO mean calculated velocity dopler: 133 cm/s
AV Area VTI index: 1.57 cm2/m2
AV Area VTI: 2.15 cm2
AV Area mean vel: 2.54 cm2
AV Mean grad: 9 mmHg
AV Peak grad: 23 mmHg
AV VEL mean LVOT/AV: 0.67
AV area mean vel ind: 1.28 cm2/m2
AV peak Index: 1.09
AV pk vel: 239 cm/s
AV vel: 3.1
Ao pk vel: 0.56 m/s
FS: 36 % (ref 28–44)
LVOT SV: 104 mL
LVOT VTI: 27.4 cm
LVOT area: 3.8 cm2
LVOT diameter: 22 mm
LVOT peak VTI: 0.82 cm
LVOT peak grad rest: 7 mmHg
LVOT peak vel: 135 cm/s
VTI: 33.6 cm
Valve area index: 1.57
Valve area: 3.1 cm2

## 2017-09-09 LAB — POCT I-STAT, CHEM 8
BUN: 24 mg/dL — ABNORMAL HIGH (ref 6–20)
BUN: 22 mg/dL — ABNORMAL HIGH (ref 6–20)
BUN: 18 mg/dL (ref 6–20)
BUN: 20 mg/dL (ref 6–20)
BUN: 22 mg/dL — ABNORMAL HIGH (ref 6–20)
BUN: 21 mg/dL — ABNORMAL HIGH (ref 6–20)
BUN: 21 mg/dL — ABNORMAL HIGH (ref 6–20)
BUN: 20 mg/dL (ref 6–20)
BUN: 21 mg/dL — ABNORMAL HIGH (ref 6–20)
BUN: 19 mg/dL (ref 6–20)
Calcium, Ion: 1.22 mmol/L (ref 1.15–1.40)
Calcium, Ion: 1.18 mmol/L (ref 1.15–1.40)
Calcium, Ion: 0.96 mmol/L — ABNORMAL LOW (ref 1.15–1.40)
Calcium, Ion: 1.05 mmol/L — ABNORMAL LOW (ref 1.15–1.40)
Calcium, Ion: 1.21 mmol/L (ref 1.15–1.40)
Calcium, Ion: 1.08 mmol/L — ABNORMAL LOW (ref 1.15–1.40)
Calcium, Ion: 1.14 mmol/L — ABNORMAL LOW (ref 1.15–1.40)
Calcium, Ion: 1.14 mmol/L — ABNORMAL LOW (ref 1.15–1.40)
Calcium, Ion: 1.13 mmol/L — ABNORMAL LOW (ref 1.15–1.40)
Calcium, Ion: 1.16 mmol/L (ref 1.15–1.40)
Chloride: 102 mmol/L (ref 101–111)
Chloride: 103 mmol/L (ref 101–111)
Chloride: 103 mmol/L (ref 101–111)
Chloride: 109 mmol/L (ref 101–111)
Chloride: 99 mmol/L — ABNORMAL LOW (ref 101–111)
Chloride: 97 mmol/L — ABNORMAL LOW (ref 101–111)
Chloride: 98 mmol/L — ABNORMAL LOW (ref 101–111)
Chloride: 101 mmol/L (ref 101–111)
Chloride: 103 mmol/L (ref 101–111)
Chloride: 104 mmol/L (ref 101–111)
Creatinine, Ser: 1.2 mg/dL (ref 0.61–1.24)
Creatinine, Ser: 1.1 mg/dL (ref 0.61–1.24)
Creatinine, Ser: 0.7 mg/dL (ref 0.61–1.24)
Creatinine, Ser: 0.9 mg/dL (ref 0.61–1.24)
Creatinine, Ser: 1 mg/dL (ref 0.61–1.24)
Creatinine, Ser: 0.9 mg/dL (ref 0.61–1.24)
Creatinine, Ser: 1 mg/dL (ref 0.61–1.24)
Creatinine, Ser: 1 mg/dL (ref 0.61–1.24)
Creatinine, Ser: 1 mg/dL (ref 0.61–1.24)
Creatinine, Ser: 1.1 mg/dL (ref 0.61–1.24)
Glucose, Bld: 88 mg/dL (ref 65–99)
Glucose, Bld: 113 mg/dL — ABNORMAL HIGH (ref 65–99)
Glucose, Bld: 109 mg/dL — ABNORMAL HIGH (ref 65–99)
Glucose, Bld: 94 mg/dL (ref 65–99)
Glucose, Bld: 94 mg/dL (ref 65–99)
Glucose, Bld: 172 mg/dL — ABNORMAL HIGH (ref 65–99)
Glucose, Bld: 199 mg/dL — ABNORMAL HIGH (ref 65–99)
Glucose, Bld: 194 mg/dL — ABNORMAL HIGH (ref 65–99)
Glucose, Bld: 131 mg/dL — ABNORMAL HIGH (ref 65–99)
Glucose, Bld: 139 mg/dL — ABNORMAL HIGH (ref 65–99)
HCT: 30 % — ABNORMAL LOW (ref 39.0–52.0)
HCT: 27 % — ABNORMAL LOW (ref 39.0–52.0)
HCT: 22 % — ABNORMAL LOW (ref 39.0–52.0)
HCT: 25 % — ABNORMAL LOW (ref 39.0–52.0)
HCT: 27 % — ABNORMAL LOW (ref 39.0–52.0)
HCT: 22 % — ABNORMAL LOW (ref 39.0–52.0)
HCT: 23 % — ABNORMAL LOW (ref 39.0–52.0)
HCT: 25 % — ABNORMAL LOW (ref 39.0–52.0)
HCT: 30 % — ABNORMAL LOW (ref 39.0–52.0)
HCT: 22 % — ABNORMAL LOW (ref 39.0–52.0)
Hemoglobin: 10.2 g/dL — ABNORMAL LOW (ref 13.0–17.0)
Hemoglobin: 9.2 g/dL — ABNORMAL LOW (ref 13.0–17.0)
Hemoglobin: 7.5 g/dL — ABNORMAL LOW (ref 13.0–17.0)
Hemoglobin: 8.5 g/dL — ABNORMAL LOW (ref 13.0–17.0)
Hemoglobin: 9.2 g/dL — ABNORMAL LOW (ref 13.0–17.0)
Hemoglobin: 7.5 g/dL — ABNORMAL LOW (ref 13.0–17.0)
Hemoglobin: 7.8 g/dL — ABNORMAL LOW (ref 13.0–17.0)
Hemoglobin: 8.5 g/dL — ABNORMAL LOW (ref 13.0–17.0)
Hemoglobin: 10.2 g/dL — ABNORMAL LOW (ref 13.0–17.0)
Hemoglobin: 7.5 g/dL — ABNORMAL LOW (ref 13.0–17.0)
Potassium: 4.1 mmol/L (ref 3.5–5.1)
Potassium: 3.8 mmol/L (ref 3.5–5.1)
Potassium: 3 mmol/L — ABNORMAL LOW (ref 3.5–5.1)
Potassium: 3.7 mmol/L (ref 3.5–5.1)
Potassium: 3.7 mmol/L (ref 3.5–5.1)
Potassium: 4 mmol/L (ref 3.5–5.1)
Potassium: 4.2 mmol/L (ref 3.5–5.1)
Potassium: 4.3 mmol/L (ref 3.5–5.1)
Potassium: 3.7 mmol/L (ref 3.5–5.1)
Potassium: 4.2 mmol/L (ref 3.5–5.1)
Sodium: 139 mmol/L (ref 135–145)
Sodium: 138 mmol/L (ref 135–145)
Sodium: 139 mmol/L (ref 135–145)
Sodium: 141 mmol/L (ref 135–145)
Sodium: 142 mmol/L (ref 135–145)
Sodium: 134 mmol/L — ABNORMAL LOW (ref 135–145)
Sodium: 138 mmol/L (ref 135–145)
Sodium: 137 mmol/L (ref 135–145)
Sodium: 140 mmol/L (ref 135–145)
Sodium: 140 mmol/L (ref 135–145)
TCO2: 26 mmol/L (ref 22–32)
TCO2: 26 mmol/L (ref 22–32)
TCO2: 22 mmol/L (ref 22–32)
TCO2: 26 mmol/L (ref 22–32)
TCO2: 26 mmol/L (ref 22–32)
TCO2: 26 mmol/L (ref 22–32)
TCO2: 25 mmol/L (ref 22–32)
TCO2: 26 mmol/L (ref 22–32)
TCO2: 23 mmol/L (ref 22–32)
TCO2: 22 mmol/L (ref 22–32)

## 2017-09-09 LAB — GLUCOSE, CAPILLARY
Glucose-Capillary: 90 mg/dL (ref 65–99)
Glucose-Capillary: 138 mg/dL — ABNORMAL HIGH (ref 65–99)
Glucose-Capillary: 143 mg/dL — ABNORMAL HIGH (ref 65–99)
Glucose-Capillary: 125 mg/dL — ABNORMAL HIGH (ref 65–99)
Glucose-Capillary: 142 mg/dL — ABNORMAL HIGH (ref 65–99)
Glucose-Capillary: 134 mg/dL — ABNORMAL HIGH (ref 65–99)

## 2017-09-09 LAB — MAGNESIUM: Magnesium: 2.4 mg/dL (ref 1.7–2.4)

## 2017-09-09 LAB — CBC
HCT: 29.3 % — ABNORMAL LOW (ref 39.0–52.0)
HCT: 24 % — ABNORMAL LOW (ref 39.0–52.0)
Hemoglobin: 9.8 g/dL — ABNORMAL LOW (ref 13.0–17.0)
Hemoglobin: 8.2 g/dL — ABNORMAL LOW (ref 13.0–17.0)
MCH: 30.9 pg (ref 26.0–34.0)
MCH: 31.7 pg (ref 26.0–34.0)
MCHC: 33.4 g/dL (ref 30.0–36.0)
MCHC: 34.2 g/dL (ref 30.0–36.0)
MCV: 92.4 fL (ref 78.0–100.0)
MCV: 92.7 fL (ref 78.0–100.0)
Platelets: 103 10*3/uL — ABNORMAL LOW (ref 150–400)
Platelets: 96 10*3/uL — ABNORMAL LOW (ref 150–400)
RBC: 3.17 MIL/uL — ABNORMAL LOW (ref 4.22–5.81)
RBC: 2.59 MIL/uL — ABNORMAL LOW (ref 4.22–5.81)
RDW: 12.2 % (ref 11.5–15.5)
RDW: 12.6 % (ref 11.5–15.5)
WBC: 11.9 10*3/uL — ABNORMAL HIGH (ref 4.0–10.5)
WBC: 8.9 10*3/uL (ref 4.0–10.5)

## 2017-09-09 LAB — PLATELET COUNT: Platelets: 158 10*3/uL (ref 150–400)

## 2017-09-09 LAB — CREATININE, SERUM
Creatinine, Ser: 1.18 mg/dL (ref 0.61–1.24)
GFR calc Af Amer: >60 mL/min (ref >60)
GFR calc non Af Amer: 59 mL/min — ABNORMAL LOW (ref >60)

## 2017-09-09 LAB — APTT: aPTT: 36 seconds (ref 24–36)

## 2017-09-09 LAB — PROTIME-INR
INR: 1.6
Prothrombin Time: 18.9 seconds — ABNORMAL HIGH (ref 11.4–15.2)

## 2017-09-09 LAB — HEMOGLOBIN AND HEMATOCRIT, BLOOD
HCT: 25.2 % — ABNORMAL LOW (ref 39.0–52.0)
Hemoglobin: 8.6 g/dL — ABNORMAL LOW (ref 13.0–17.0)

## 2017-09-09 LAB — PREPARE RBC (CROSSMATCH)

## 2017-09-09 SURGERY — REPLACEMENT, AORTIC VALVE, OPEN
Anesthesia: General | Site: Chest

## 2017-09-09 MED ORDER — SERTRALINE HCL 25 MG PO TABS
25.0000 mg | ORAL_TABLET | Freq: Every day | ORAL | Status: DC
  Administered 2017-09-10 – 2017-09-19 (×10): 25 mg via ORAL
  Filled 2017-09-09 (×12): qty 1

## 2017-09-09 MED ORDER — BISACODYL 5 MG PO TBEC
10.0000 mg | DELAYED_RELEASE_TABLET | Freq: Every day | ORAL | Status: DC
  Administered 2017-09-10 – 2017-09-14 (×5): 10 mg via ORAL
  Filled 2017-09-09 (×5): qty 2

## 2017-09-09 MED ORDER — SODIUM CHLORIDE 0.9 % IJ SOLN
OROMUCOSAL | Status: DC | PRN
  Administered 2017-09-09: 4 mL via TOPICAL

## 2017-09-09 MED ORDER — ACETAMINOPHEN 160 MG/5ML PO SOLN
1000.0000 mg | Freq: Four times a day (QID) | ORAL | Status: AC
  Administered 2017-09-12 (×2): 1000 mg
  Filled 2017-09-09 (×2): qty 40.6

## 2017-09-09 MED ORDER — TEMAZEPAM 15 MG PO CAPS: 15.0000 mg | ORAL_CAPSULE | Freq: Once | ORAL | Status: DC | PRN

## 2017-09-09 MED ORDER — CHLORHEXIDINE GLUCONATE CLOTH 2 % EX PADS
6.0000 | MEDICATED_PAD | Freq: Every day | CUTANEOUS | Status: DC
  Administered 2017-09-09: 6 via TOPICAL

## 2017-09-09 MED ORDER — LACTATED RINGERS IV SOLN
INTRAVENOUS | Status: DC | PRN
  Administered 2017-09-09: 07:00:00 via INTRAVENOUS

## 2017-09-09 MED ORDER — HEPARIN SODIUM (PORCINE) 1000 UNIT/ML IJ SOLN
INTRAMUSCULAR | Status: AC
  Filled 2017-09-09: qty 1

## 2017-09-09 MED ORDER — FENTANYL CITRATE (PF) 250 MCG/5ML IJ SOLN
INTRAMUSCULAR | Status: AC
  Filled 2017-09-09: qty 5

## 2017-09-09 MED ORDER — LACTATED RINGERS IV SOLN
INTRAVENOUS | Status: DC
  Administered 2017-09-09: 20 mL/h via INTRAVENOUS

## 2017-09-09 MED ORDER — ASPIRIN EC 325 MG PO TBEC
325.0000 mg | DELAYED_RELEASE_TABLET | Freq: Every day | ORAL | Status: DC
  Administered 2017-09-10 – 2017-09-15 (×6): 325 mg via ORAL
  Filled 2017-09-09 (×6): qty 1

## 2017-09-09 MED ORDER — MIDAZOLAM HCL 5 MG/5ML IJ SOLN
INTRAMUSCULAR | Status: DC | PRN
  Administered 2017-09-09: 3 mg via INTRAVENOUS
  Administered 2017-09-09 (×2): 1 mg via INTRAVENOUS
  Administered 2017-09-09: 2 mg via INTRAVENOUS
  Administered 2017-09-09: 1 mg via INTRAVENOUS
  Administered 2017-09-09: 2 mg via INTRAVENOUS

## 2017-09-09 MED ORDER — CHLORHEXIDINE GLUCONATE 0.12% ORAL RINSE (MEDLINE KIT)
15.0000 mL | Freq: Two times a day (BID) | OROMUCOSAL | Status: DC
  Administered 2017-09-09: 15 mL via OROMUCOSAL

## 2017-09-09 MED ORDER — SODIUM CHLORIDE 0.9 % IV SOLN
INTRAVENOUS | Status: DC
  Administered 2017-09-09: 0.7 [IU]/h via INTRAVENOUS
  Filled 2017-09-09: qty 1

## 2017-09-09 MED ORDER — ORAL CARE MOUTH RINSE
15.0000 mL | OROMUCOSAL | Status: DC
  Administered 2017-09-09: 15 mL via OROMUCOSAL

## 2017-09-09 MED ORDER — ORAL CARE MOUTH RINSE
15.0000 mL | Freq: Two times a day (BID) | OROMUCOSAL | Status: DC
  Administered 2017-09-09 – 2017-09-17 (×10): 15 mL via OROMUCOSAL

## 2017-09-09 MED ORDER — SODIUM CHLORIDE 0.9 % IV SOLN: 250.0000 mL | INTRAVENOUS | Status: DC

## 2017-09-09 MED ORDER — CHLORHEXIDINE GLUCONATE CLOTH 2 % EX PADS: 6.0000 | MEDICATED_PAD | Freq: Once | CUTANEOUS | Status: DC

## 2017-09-09 MED ORDER — METOPROLOL TARTRATE 12.5 MG HALF TABLET
ORAL_TABLET | ORAL | Status: AC
  Administered 2017-09-09: 12.5 mg
  Filled 2017-09-09: qty 1

## 2017-09-09 MED ORDER — PROTAMINE SULFATE 10 MG/ML IV SOLN
INTRAVENOUS | Status: AC
  Filled 2017-09-09: qty 25

## 2017-09-09 MED ORDER — ROCURONIUM BROMIDE 10 MG/ML (PF) SYRINGE
PREFILLED_SYRINGE | INTRAVENOUS | Status: DC | PRN
  Administered 2017-09-09: 40 mg via INTRAVENOUS
  Administered 2017-09-09: 50 mg via INTRAVENOUS
  Administered 2017-09-09: 40 mg via INTRAVENOUS
  Administered 2017-09-09: 20 mg via INTRAVENOUS
  Administered 2017-09-09: 60 mg via INTRAVENOUS

## 2017-09-09 MED ORDER — PHENYLEPHRINE HCL 10 MG/ML IJ SOLN
0.0000 ug/min | INTRAMUSCULAR | Status: DC
  Administered 2017-09-09 – 2017-09-10 (×2): 20 ug/min via INTRAVENOUS
  Administered 2017-09-10: 60 ug/min via INTRAVENOUS
  Administered 2017-09-12: 20 ug/min via INTRAVENOUS
  Administered 2017-09-13: 60 ug/min via INTRAVENOUS
  Filled 2017-09-09 (×6): qty 2

## 2017-09-09 MED ORDER — ALBUMIN HUMAN 5 % IV SOLN
250.0000 mL | INTRAVENOUS | Status: AC | PRN
  Administered 2017-09-09 (×4): 250 mL via INTRAVENOUS
  Filled 2017-09-09 (×2): qty 250

## 2017-09-09 MED ORDER — ATORVASTATIN CALCIUM 40 MG PO TABS
40.0000 mg | ORAL_TABLET | Freq: Every day | ORAL | Status: DC
  Administered 2017-09-10 – 2017-09-19 (×10): 40 mg via ORAL
  Filled 2017-09-09 (×11): qty 1

## 2017-09-09 MED ORDER — METOPROLOL TARTRATE 5 MG/5ML IV SOLN: 2.5000 mg | INTRAVENOUS | Status: DC | PRN

## 2017-09-09 MED ORDER — PROTAMINE SULFATE 10 MG/ML IV SOLN
INTRAVENOUS | Status: DC | PRN
  Administered 2017-09-09: 290 mg via INTRAVENOUS

## 2017-09-09 MED ORDER — HEMOSTATIC AGENTS (NO CHARGE) OPTIME
TOPICAL | Status: DC | PRN
  Administered 2017-09-09: 1

## 2017-09-09 MED ORDER — POTASSIUM CHLORIDE 10 MEQ/50ML IV SOLN
10.0000 meq | INTRAVENOUS | Status: AC
  Administered 2017-09-09 (×2): 10 meq via INTRAVENOUS

## 2017-09-09 MED ORDER — DOCUSATE SODIUM 100 MG PO CAPS
200.0000 mg | ORAL_CAPSULE | Freq: Every day | ORAL | Status: DC
  Administered 2017-09-10 – 2017-09-15 (×6): 200 mg via ORAL
  Filled 2017-09-09 (×6): qty 2

## 2017-09-09 MED ORDER — DOPAMINE-DEXTROSE 3.2-5 MG/ML-% IV SOLN
3.0000 ug/kg/min | INTRAVENOUS | Status: DC
  Administered 2017-09-09: 3 ug/kg/min via INTRAVENOUS

## 2017-09-09 MED ORDER — ACETAMINOPHEN 500 MG PO TABS
1000.0000 mg | ORAL_TABLET | Freq: Four times a day (QID) | ORAL | Status: AC
  Administered 2017-09-09 – 2017-09-14 (×18): 1000 mg via ORAL
  Filled 2017-09-09 (×18): qty 2

## 2017-09-09 MED ORDER — HEPARIN SODIUM (PORCINE) 1000 UNIT/ML IJ SOLN
INTRAMUSCULAR | Status: DC | PRN
  Administered 2017-09-09: 29000 [IU] via INTRAVENOUS

## 2017-09-09 MED ORDER — MIDAZOLAM HCL 10 MG/2ML IJ SOLN
INTRAMUSCULAR | Status: AC
  Filled 2017-09-09: qty 2

## 2017-09-09 MED ORDER — TRAMADOL HCL 50 MG PO TABS
50.0000 mg | ORAL_TABLET | ORAL | Status: DC | PRN
  Administered 2017-09-11 – 2017-09-13 (×5): 100 mg via ORAL
  Filled 2017-09-09 (×5): qty 2

## 2017-09-09 MED ORDER — VANCOMYCIN HCL IN DEXTROSE 1-5 GM/200ML-% IV SOLN
1000.0000 mg | Freq: Once | INTRAVENOUS | Status: AC
  Administered 2017-09-09: 1000 mg via INTRAVENOUS
  Filled 2017-09-09: qty 200

## 2017-09-09 MED ORDER — SODIUM CHLORIDE 0.9% FLUSH: 3.0000 mL | INTRAVENOUS | Status: DC | PRN

## 2017-09-09 MED ORDER — PHENYLEPHRINE HCL 10 MG/ML IJ SOLN
INTRAVENOUS | Status: DC | PRN
  Administered 2017-09-09: 40 ug/min via INTRAVENOUS

## 2017-09-09 MED ORDER — ACETAMINOPHEN 160 MG/5ML PO SOLN: 650.0000 mg | Freq: Once | ORAL | Status: AC

## 2017-09-09 MED ORDER — HEMOSTATIC AGENTS (NO CHARGE) OPTIME
TOPICAL | Status: DC | PRN
  Administered 2017-09-09: 1 via TOPICAL

## 2017-09-09 MED ORDER — SODIUM CHLORIDE 0.9% FLUSH
3.0000 mL | Freq: Two times a day (BID) | INTRAVENOUS | Status: DC
  Administered 2017-09-10 – 2017-09-15 (×11): 3 mL via INTRAVENOUS

## 2017-09-09 MED ORDER — MAGNESIUM SULFATE 4 GM/100ML IV SOLN: 4.0000 g | Freq: Once | INTRAVENOUS | Status: DC

## 2017-09-09 MED ORDER — MORPHINE SULFATE (PF) 2 MG/ML IV SOLN: 1.0000 mg | INTRAVENOUS | Status: DC | PRN

## 2017-09-09 MED ORDER — CHLORHEXIDINE GLUCONATE 0.12 % MT SOLN
15.0000 mL | OROMUCOSAL | Status: DC
  Administered 2017-09-09: 15 mL via OROMUCOSAL

## 2017-09-09 MED ORDER — FENTANYL CITRATE (PF) 250 MCG/5ML IJ SOLN
INTRAMUSCULAR | Status: AC
  Filled 2017-09-09: qty 20

## 2017-09-09 MED ORDER — PHENYLEPHRINE HCL 10 MG/ML IJ SOLN
INTRAMUSCULAR | Status: AC
  Filled 2017-09-09: qty 1

## 2017-09-09 MED ORDER — CHLORHEXIDINE GLUCONATE 0.12 % MT SOLN: 15.0000 mL | Freq: Once | OROMUCOSAL | Status: DC

## 2017-09-09 MED ORDER — 0.9 % SODIUM CHLORIDE (POUR BTL) OPTIME
TOPICAL | Status: DC | PRN
  Administered 2017-09-09: 6000 mL

## 2017-09-09 MED ORDER — FENTANYL CITRATE (PF) 250 MCG/5ML IJ SOLN
INTRAMUSCULAR | Status: AC
Start: 2017-09-09 — End: 2017-09-09
  Filled 2017-09-09: qty 5

## 2017-09-09 MED ORDER — CHLORHEXIDINE GLUCONATE 0.12 % MT SOLN
OROMUCOSAL | Status: AC
  Administered 2017-09-09: 15 mL
  Filled 2017-09-09: qty 15

## 2017-09-09 MED ORDER — LACTATED RINGERS IV SOLN: 500.0000 mL | Freq: Once | INTRAVENOUS | Status: DC | PRN

## 2017-09-09 MED ORDER — EPHEDRINE 5 MG/ML INJ
INTRAVENOUS | Status: AC
  Filled 2017-09-09: qty 10

## 2017-09-09 MED ORDER — MAGNESIUM SULFATE 2 GM/50ML IV SOLN
2.0000 g | Freq: Once | INTRAVENOUS | Status: AC
  Administered 2017-09-09: 2 g via INTRAVENOUS
  Filled 2017-09-09: qty 50

## 2017-09-09 MED ORDER — PROTAMINE SULFATE 10 MG/ML IV SOLN
INTRAVENOUS | Status: AC
  Filled 2017-09-09: qty 5

## 2017-09-09 MED ORDER — SODIUM CHLORIDE 0.9 % IV SOLN
INTRAVENOUS | Status: DC
  Administered 2017-09-09: 20 mL/h via INTRAVENOUS
  Administered 2017-09-10: 04:00:00 via INTRAVENOUS

## 2017-09-09 MED ORDER — ONDANSETRON HCL 4 MG/2ML IJ SOLN: 4.0000 mg | Freq: Four times a day (QID) | INTRAMUSCULAR | Status: DC | PRN

## 2017-09-09 MED ORDER — LACTATED RINGERS IV SOLN
INTRAVENOUS | Status: DC
  Administered 2017-09-09: 20 mL/h via INTRAVENOUS
  Administered 2017-09-10: 21:00:00 via INTRAVENOUS

## 2017-09-09 MED ORDER — ROCURONIUM BROMIDE 10 MG/ML (PF) SYRINGE
PREFILLED_SYRINGE | INTRAVENOUS | Status: AC
  Filled 2017-09-09: qty 10

## 2017-09-09 MED ORDER — INSULIN REGULAR BOLUS VIA INFUSION
0.0000 [IU] | Freq: Three times a day (TID) | INTRAVENOUS | Status: DC
  Filled 2017-09-09: qty 10

## 2017-09-09 MED ORDER — CHLORHEXIDINE GLUCONATE CLOTH 2 % EX PADS
6.0000 | MEDICATED_PAD | Freq: Once | CUTANEOUS | Status: DC
  Administered 2017-09-09: 6 via TOPICAL

## 2017-09-09 MED ORDER — PROPOFOL 10 MG/ML IV BOLUS
INTRAVENOUS | Status: DC | PRN
  Administered 2017-09-09: 100 mg via INTRAVENOUS

## 2017-09-09 MED ORDER — MIDAZOLAM HCL 2 MG/2ML IJ SOLN: 2.0000 mg | INTRAMUSCULAR | Status: DC | PRN

## 2017-09-09 MED ORDER — PLASMA-LYTE 148 IV SOLN
INTRAVENOUS | Status: DC | PRN
  Administered 2017-09-09: 500 mL via INTRAVASCULAR

## 2017-09-09 MED ORDER — LACTATED RINGERS IV SOLN
INTRAVENOUS | Status: DC | PRN
  Administered 2017-09-09: 08:00:00 via INTRAVENOUS

## 2017-09-09 MED ORDER — BISACODYL 5 MG PO TBEC
5.0000 mg | DELAYED_RELEASE_TABLET | Freq: Once | ORAL | Status: DC
  Filled 2017-09-09: qty 1

## 2017-09-09 MED ORDER — MORPHINE SULFATE (PF) 2 MG/ML IV SOLN: 2.0000 mg | INTRAVENOUS | Status: DC | PRN

## 2017-09-09 MED ORDER — FAMOTIDINE IN NACL 20-0.9 MG/50ML-% IV SOLN: 20.0000 mg | Freq: Two times a day (BID) | INTRAVENOUS | Status: DC

## 2017-09-09 MED ORDER — METOPROLOL TARTRATE 12.5 MG HALF TABLET: 12.5000 mg | ORAL_TABLET | Freq: Once | ORAL | Status: DC

## 2017-09-09 MED ORDER — LACTATED RINGERS IV SOLN
INTRAVENOUS | Status: DC | PRN
  Administered 2017-09-09 (×2): via INTRAVENOUS

## 2017-09-09 MED ORDER — ACETAMINOPHEN 650 MG RE SUPP
650.0000 mg | Freq: Once | RECTAL | Status: AC
  Administered 2017-09-09: 650 mg via RECTAL

## 2017-09-09 MED ORDER — DEXTROSE 5 % IV SOLN
INTRAVENOUS | Status: DC | PRN
  Administered 2017-09-09: 750 mg via INTRAVENOUS

## 2017-09-09 MED ORDER — LACTATED RINGERS IV SOLN
INTRAVENOUS | Status: DC | PRN
  Administered 2017-09-09 (×2): via INTRAVENOUS

## 2017-09-09 MED ORDER — PROPOFOL 10 MG/ML IV BOLUS
INTRAVENOUS | Status: AC
  Filled 2017-09-09: qty 20

## 2017-09-09 MED ORDER — MUPIROCIN 2 % EX OINT
1.0000 "application " | TOPICAL_OINTMENT | Freq: Two times a day (BID) | CUTANEOUS | Status: DC
  Filled 2017-09-09: qty 22

## 2017-09-09 MED ORDER — CHLORHEXIDINE GLUCONATE 0.12 % MT SOLN
15.0000 mL | Freq: Once | OROMUCOSAL | Status: DC
  Filled 2017-09-09: qty 15

## 2017-09-09 MED ORDER — FENTANYL CITRATE (PF) 250 MCG/5ML IJ SOLN
INTRAMUSCULAR | Status: DC | PRN
  Administered 2017-09-09: 200 ug via INTRAVENOUS
  Administered 2017-09-09 (×2): 250 ug via INTRAVENOUS
  Administered 2017-09-09: 50 ug via INTRAVENOUS
  Administered 2017-09-09: 250 ug via INTRAVENOUS
  Administered 2017-09-09: 50 ug via INTRAVENOUS
  Administered 2017-09-09: 250 ug via INTRAVENOUS

## 2017-09-09 MED ORDER — BISACODYL 10 MG RE SUPP: 10.0000 mg | Freq: Every day | RECTAL | Status: DC

## 2017-09-09 MED ORDER — METOPROLOL TARTRATE 12.5 MG HALF TABLET
12.5000 mg | ORAL_TABLET | Freq: Two times a day (BID) | ORAL | Status: DC
  Administered 2017-09-10 – 2017-09-15 (×7): 12.5 mg via ORAL
  Filled 2017-09-09 (×9): qty 1

## 2017-09-09 MED ORDER — ASPIRIN 81 MG PO CHEW
324.0000 mg | CHEWABLE_TABLET | Freq: Every day | ORAL | Status: DC
  Administered 2017-09-16: 324 mg
  Filled 2017-09-09 (×2): qty 4

## 2017-09-09 MED ORDER — ROCURONIUM BROMIDE 100 MG/10ML IV SOLN: INTRAVENOUS | Status: DC | PRN

## 2017-09-09 MED ORDER — MORPHINE SULFATE (PF) 4 MG/ML IV SOLN
2.0000 mg | INTRAVENOUS | Status: DC | PRN
  Administered 2017-09-09 – 2017-09-10 (×2): 2 mg via INTRAVENOUS
  Administered 2017-09-10: 4 mg via INTRAVENOUS
  Administered 2017-09-10: 2 mg via INTRAVENOUS
  Administered 2017-09-11: 4 mg via INTRAVENOUS
  Filled 2017-09-09 (×5): qty 1

## 2017-09-09 MED ORDER — TAMSULOSIN HCL 0.4 MG PO CAPS
0.4000 mg | ORAL_CAPSULE | Freq: Every day | ORAL | Status: DC
  Administered 2017-09-10 – 2017-09-19 (×10): 0.4 mg via ORAL
  Filled 2017-09-09 (×10): qty 1

## 2017-09-09 MED ORDER — SODIUM CHLORIDE 0.9 % IV SOLN
0.0000 ug/kg/h | INTRAVENOUS | Status: DC
  Administered 2017-09-09: 0.7 ug/kg/h via INTRAVENOUS
  Filled 2017-09-09: qty 2

## 2017-09-09 MED ORDER — NITROGLYCERIN IN D5W 200-5 MCG/ML-% IV SOLN
0.0000 ug/min | INTRAVENOUS | Status: DC
  Administered 2017-09-09: 10 ug/min via INTRAVENOUS

## 2017-09-09 MED ORDER — PANTOPRAZOLE SODIUM 40 MG PO TBEC
40.0000 mg | DELAYED_RELEASE_TABLET | Freq: Every day | ORAL | Status: DC
  Administered 2017-09-11 – 2017-09-15 (×5): 40 mg via ORAL
  Filled 2017-09-09 (×5): qty 1

## 2017-09-09 MED ORDER — SODIUM CHLORIDE 0.45 % IV SOLN: INTRAVENOUS | Status: DC | PRN

## 2017-09-09 MED ORDER — METOPROLOL TARTRATE 25 MG/10 ML ORAL SUSPENSION: 12.5000 mg | Freq: Two times a day (BID) | ORAL | Status: DC

## 2017-09-09 MED ORDER — EPHEDRINE SULFATE 50 MG/ML IJ SOLN
INTRAMUSCULAR | Status: DC | PRN
  Administered 2017-09-09: 10 mg via INTRAVENOUS
  Administered 2017-09-09: 15 mg via INTRAVENOUS

## 2017-09-09 MED ORDER — DEXTROSE 5 % IV SOLN
1.5000 g | Freq: Two times a day (BID) | INTRAVENOUS | Status: AC
  Administered 2017-09-09 – 2017-09-11 (×4): 1.5 g via INTRAVENOUS
  Filled 2017-09-09 (×4): qty 1.5

## 2017-09-09 MED ORDER — ALBUMIN HUMAN 5 % IV SOLN
INTRAVENOUS | Status: DC | PRN
  Administered 2017-09-09 (×2): via INTRAVENOUS

## 2017-09-09 MED ORDER — PHENYLEPHRINE HCL 10 MG/ML IJ SOLN
INTRAVENOUS | Status: DC | PRN
  Administered 2017-09-09: 80 ug/min via INTRAVENOUS

## 2017-09-09 MED ORDER — MORPHINE SULFATE (PF) 4 MG/ML IV SOLN: 1.0000 mg | INTRAVENOUS | Status: AC | PRN

## 2017-09-09 MED ORDER — OXYCODONE HCL 5 MG PO TABS
5.0000 mg | ORAL_TABLET | ORAL | Status: DC | PRN
  Administered 2017-09-10: 10 mg via ORAL
  Administered 2017-09-10: 5 mg via ORAL
  Administered 2017-09-10: 10 mg via ORAL
  Administered 2017-09-10: 5 mg via ORAL
  Administered 2017-09-11 – 2017-09-12 (×2): 10 mg via ORAL
  Administered 2017-09-13: 5 mg via ORAL
  Filled 2017-09-09 (×3): qty 2
  Filled 2017-09-09 (×3): qty 1
  Filled 2017-09-09 (×2): qty 2

## 2017-09-09 SURGICAL SUPPLY — 95 items
ADAPTER CARDIO PERF ANTE/RETRO (ADAPTER) ×3 IMPLANT
APPLICATOR COTTON TIP 6IN STRL (MISCELLANEOUS) IMPLANT
BAG DECANTER FOR FLEXI CONT (MISCELLANEOUS) ×3 IMPLANT
BANDAGE ACE 4X5 VEL STRL LF (GAUZE/BANDAGES/DRESSINGS) ×6 IMPLANT
BANDAGE ACE 6X5 VEL STRL LF (GAUZE/BANDAGES/DRESSINGS) ×3 IMPLANT
BLADE NEEDLE 3 SS STRL (BLADE) ×3 IMPLANT
BLADE STERNUM SYSTEM 6 (BLADE) ×3 IMPLANT
BLADE SURG 15 STRL LF DISP TIS (BLADE) ×2 IMPLANT
BLADE SURG 15 STRL SS (BLADE) ×1
BNDG GAUZE ELAST 4 BULKY (GAUZE/BANDAGES/DRESSINGS) ×3 IMPLANT
BOOT SUTURE AID YELLOW STND (SUTURE) IMPLANT
CANISTER SUCT 3000ML PPV (MISCELLANEOUS) ×3 IMPLANT
CANNULA GUNDRY RCSP 15FR (MISCELLANEOUS) ×3 IMPLANT
CATH CPB KIT GERHARDT (MISCELLANEOUS) ×3 IMPLANT
CATH HEART VENT LEFT (CATHETERS) ×2 IMPLANT
CATH THORACIC 28FR (CATHETERS) ×6 IMPLANT
CATH/SQUID NICHOLS JEHLE COR (CATHETERS) IMPLANT
CLIP FOGARTY SPRING 6M (CLIP) ×3 IMPLANT
COVER MAYO STAND STRL (DRAPES) ×3 IMPLANT
CRADLE DONUT ADULT HEAD (MISCELLANEOUS) ×3 IMPLANT
DRAIN CHANNEL 28F RND 3/8 FF (WOUND CARE) ×3 IMPLANT
DRAPE CARDIOVASCULAR INCISE (DRAPES) ×1
DRAPE SLUSH/WARMER DISC (DRAPES) ×3 IMPLANT
DRAPE SRG 135X102X78XABS (DRAPES) ×2 IMPLANT
DRSG AQUACEL AG ADV 3.5X14 (GAUZE/BANDAGES/DRESSINGS) ×3 IMPLANT
ELECT BLADE 4.0 EZ CLEAN MEGAD (MISCELLANEOUS) ×3
ELECT CAUTERY BLADE 6.4 (BLADE) ×3 IMPLANT
ELECT REM PT RETURN 9FT ADLT (ELECTROSURGICAL) ×6
ELECTRODE BLDE 4.0 EZ CLN MEGD (MISCELLANEOUS) ×2 IMPLANT
ELECTRODE REM PT RTRN 9FT ADLT (ELECTROSURGICAL) ×4 IMPLANT
FELT TEFLON 1X6 (MISCELLANEOUS) ×3 IMPLANT
FLOSEAL 10ML (HEMOSTASIS) ×3 IMPLANT
GAUZE SPONGE 4X4 12PLY STRL (GAUZE/BANDAGES/DRESSINGS) ×6 IMPLANT
GAUZE SPONGE 4X4 12PLY STRL LF (GAUZE/BANDAGES/DRESSINGS) ×6 IMPLANT
GLOVE BIO SURGEON STRL SZ 6 (GLOVE) ×9 IMPLANT
GLOVE BIO SURGEON STRL SZ 6.5 (GLOVE) ×24 IMPLANT
GLOVE BIOGEL PI IND STRL 6 (GLOVE) ×2 IMPLANT
GLOVE BIOGEL PI IND STRL 6.5 (GLOVE) ×10 IMPLANT
GLOVE BIOGEL PI INDICATOR 6 (GLOVE) ×1
GLOVE BIOGEL PI INDICATOR 6.5 (GLOVE) ×5
GLOVE SURG SS PI 6.0 STRL IVOR (GLOVE) ×9 IMPLANT
GOWN STRL REUS W/ TWL LRG LVL3 (GOWN DISPOSABLE) ×16 IMPLANT
GOWN STRL REUS W/TWL LRG LVL3 (GOWN DISPOSABLE) ×8
HEMOSTAT POWDER SURGIFOAM 1G (HEMOSTASIS) ×9 IMPLANT
HEMOSTAT SURGICEL 2X14 (HEMOSTASIS) ×3 IMPLANT
INSERT FOGARTY XLG (MISCELLANEOUS) IMPLANT
IV CATH 18G X1.75 CATHLON (IV SOLUTION) ×3 IMPLANT
KIT BASIN OR (CUSTOM PROCEDURE TRAY) ×3 IMPLANT
KIT CATH SUCT 8FR (CATHETERS) ×3 IMPLANT
KIT ROOM TURNOVER OR (KITS) ×3 IMPLANT
KIT SUCTION CATH 14FR (SUCTIONS) ×12 IMPLANT
KIT VASOVIEW HEMOPRO VH 3000 (KITS) ×3 IMPLANT
LEAD PACING MYOCARDI (MISCELLANEOUS) ×3 IMPLANT
LINE VENT (MISCELLANEOUS) ×3 IMPLANT
MARKER GRAFT CORONARY BYPASS (MISCELLANEOUS) ×12 IMPLANT
NEEDLE AORTIC AIR ASPIRATING (NEEDLE) ×3 IMPLANT
NS IRRIG 1000ML POUR BTL (IV SOLUTION) ×18 IMPLANT
PACK E OPEN HEART (SUTURE) ×3 IMPLANT
PACK OPEN HEART (CUSTOM PROCEDURE TRAY) ×3 IMPLANT
PAD ARMBOARD 7.5X6 YLW CONV (MISCELLANEOUS) ×6 IMPLANT
PAD CARDIAC INSULATION (MISCELLANEOUS) ×3 IMPLANT
PAD ELECT DEFIB RADIOL ZOLL (MISCELLANEOUS) ×3 IMPLANT
PENCIL BUTTON HOLSTER BLD 10FT (ELECTRODE) ×3 IMPLANT
PUNCH AORTIC ROT 4.0MM RCL 40 (MISCELLANEOUS) ×3 IMPLANT
SET CARDIOPLEGIA MPS 5001102 (MISCELLANEOUS) ×3 IMPLANT
SOLUTION ANTI FOG 6CC (MISCELLANEOUS) ×3 IMPLANT
SPONGE LAP 18X18 X RAY DECT (DISPOSABLE) ×12 IMPLANT
SUT BONE WAX W31G (SUTURE) ×3 IMPLANT
SUT ETHIBON 2 0 V 52N 30 (SUTURE) ×6 IMPLANT
SUT MNCRL AB 4-0 PS2 18 (SUTURE) ×3 IMPLANT
SUT PROLENE 3 0 RB 1 (SUTURE) ×3 IMPLANT
SUT PROLENE 3 0 SH1 36 (SUTURE) ×9 IMPLANT
SUT PROLENE 4 0 RB 1 (SUTURE) ×3
SUT PROLENE 4 0 TF (SUTURE) ×6 IMPLANT
SUT PROLENE 4-0 RB1 .5 CRCL 36 (SUTURE) ×6 IMPLANT
SUT PROLENE 6 0 CC (SUTURE) ×12 IMPLANT
SUT PROLENE 7 0 BV1 MDA (SUTURE) ×6 IMPLANT
SUT PROLENE 8 0 BV175 6 (SUTURE) ×12 IMPLANT
SUT SILK 2 0 SH CR/8 (SUTURE) ×3 IMPLANT
SUT STEEL 6MS V (SUTURE) ×3 IMPLANT
SUT STEEL SZ 6 DBL 3X14 BALL (SUTURE) ×3 IMPLANT
SUT VIC AB 1 CTX 18 (SUTURE) ×6 IMPLANT
SUT VIC AB 2-0 CT1 27 (SUTURE) ×1
SUT VIC AB 2-0 CT1 TAPERPNT 27 (SUTURE) ×2 IMPLANT
SYSTEM SAHARA CHEST DRAIN ATS (WOUND CARE) ×6 IMPLANT
TAPE CLOTH SURG 4X10 WHT LF (GAUZE/BANDAGES/DRESSINGS) ×3 IMPLANT
TAPE PAPER 2X10 WHT MICROPORE (GAUZE/BANDAGES/DRESSINGS) ×3 IMPLANT
TOWEL GREEN STERILE (TOWEL DISPOSABLE) IMPLANT
TOWEL GREEN STERILE FF (TOWEL DISPOSABLE) ×3 IMPLANT
TRAY FOLEY SILVER 16FR TEMP (SET/KITS/TRAYS/PACK) ×3 IMPLANT
TUBING INSUFFLATION (TUBING) ×3 IMPLANT
UNDERPAD 30X30 (UNDERPADS AND DIAPERS) ×3 IMPLANT
VALVE MAGNA EASE AORTIC 23MM (Prosthesis & Implant Heart) ×3 IMPLANT
VENT LEFT HEART 12002 (CATHETERS) ×3
WATER STERILE IRR 1000ML POUR (IV SOLUTION) ×6 IMPLANT

## 2017-09-09 NOTE — Procedures (Signed)
Extubation Procedure Note  Pt completed Rapid Wean without complication. Pt follows all commands. NIF -20, VC 750mls, Cuff Leak +, No Stridor. Pt extubated to 3L [place]. Strong cough. A&O.   Patient Details:   Name: Leonard Padilla DOB: 18-Oct-1945 MRN: [id number]      Evaluation  O2 sats: stable throughout Complications: No apparent complications Patient did tolerate procedure well. Bilateral Breath Sounds: Clear   Yes  Lianne BushyDaniel, Debbi Strandberg C 09/09/2017, 8:53 PM

## 2017-09-09 NOTE — Anesthesia Procedure Notes (Cosign Needed)
Arterial Line Insertion Start/End2/11/2017 6:59 AM, 09/09/2017 7:04 AM Performed by: Jeani HawkingBerry, Tabatha J, CRNA  Patient location: OOR procedure area. Preanesthetic checklist: patient identified, IV checked, site marked, risks and benefits discussed, surgical consent, monitors and equipment checked, pre-op evaluation and timeout performed Lidocaine 1% used for infiltration and patient sedated Left, radial was placed Catheter size: 20 G Hand hygiene performed  and maximum sterile barriers used   Attempts: 1 Procedure performed without using ultrasound guided technique. Following insertion, dressing applied and Biopatch. Post procedure assessment: normal  Patient tolerated the procedure well with no immediate complications. Additional procedure comments: Aline inserted by Jessee AversSRNA, Melody Savidge.

## 2017-09-09 NOTE — Progress Notes (Signed)
RT NOTE:  Cardiac Rapid Wean initiated. Pt follows commands.  

## 2017-09-09 NOTE — Progress Notes (Signed)
Patient ID: Bertha Stakesaul M Cadena, male   DOB: 06/08/1946, 72 y.o.   MRN: [id number] EVENING ROUNDS NOTE :     [address]       [place] [id number]             [phone]                 Day of Surgery Procedure(s) (LRB): AORTIC VALVE REPLACEMENT (AVR) using a 23 Edwards Perimount Magna Ease Aortic Valve (N/A) CORONARY ARTERY BYPASS GRAFTING (CABG) x 4 using the left internal mammary artery and right greater saphenous vein harvested endoscopically. (N/A) TRANSESOPHAGEAL ECHOCARDIOGRAM (TEE) (N/A)  Total Length of Stay:  LOS: 0 days  BP (!) 114/53   Pulse 82   Temp 98.4 F (36.9 C)   Resp 14   SpO2 100%   .Intake/Output      02/04 0701 - 02/05 0700   I.V. 18843968.3   Blood 500   IV Piggyback 1600   Total Intake 6068.3   Urine 1481   Blood 600   Chest Tube 200   Total Output 2281   Net +3787.3         . sodium chloride 20 mL/hr at 09/09/17 1900  . [START ON 09/10/2017] sodium chloride    . sodium chloride 20 mL/hr at 09/09/17 1900  . cefUROXime (ZINACEF)  IV 1.5 g (09/09/17 2000)  . dexmedetomidine (PRECEDEX) IV infusion 0.1 mcg/kg/hr (09/09/17 1900)  . DOPamine 3 mcg/kg/min (09/09/17 1900)  . famotidine (PEPCID) IV    . insulin (NOVOLIN-R) infusion 1.7 Units/hr (09/09/17 1900)  . lactated ringers    . lactated ringers Stopped (09/09/17 1900)  . lactated ringers 20 mL/hr at 09/09/17 1900  . nitroGLYCERIN Stopped (09/09/17 1900)  . phenylephrine (NEO-SYNEPHRINE) Adult infusion 10 mcg/min (09/09/17 1915)  . vancomycin       Lab Results  Component Value Date   WBC 11.9 (H) 09/09/2017   HGB 9.8 (L) 09/09/2017   HCT 29.3 (L) 09/09/2017   PLT 103 (L) 09/09/2017   GLUCOSE 131 (H) 09/09/2017   CHOL 190 04/24/2017   TRIG 140 04/24/2017   HDL 69 04/24/2017   LDLCALC 115 (H) 08/03/2016   ALT 25 09/04/2017   AST 24 09/04/2017   NA 140 09/09/2017   K 3.7 09/09/2017   CL 103 09/09/2017   CREATININE 1.00 09/09/2017   BUN 21 (H) 09/09/2017   CO2 19 (L)  09/04/2017   TSH 3.28 08/03/2016   PSA 0.7 04/24/2017   INR 1.60 09/09/2017   HGBA1C 5.5 09/04/2017   Still on vent, uop improved  Not bleeding    Delight OvensEdward B Genieve Ramaswamy MD  Beeper [phone] Office [phone] 09/09/2017 8:04 PM

## 2017-09-09 NOTE — Brief Op Note (Signed)
      [address]       [place] [id number]             [phone]    09/09/2017  3:22 PM  PATIENT:  Leonard Padilla  72 y.o. male  PRE-OPERATIVE DIAGNOSIS:  AS CAD  POST-OPERATIVE DIAGNOSIS:  AS CAD  PROCEDURE:  Procedure(s): AORTIC VALVE REPLACEMENT (AVR) using a 23 Edwards Perimount Magna Ease Aortic Valve (N/A) CORONARY ARTERY BYPASS GRAFTING (CABG) x 4 using the left internal mammary artery and right greater saphenous vein harvested endoscopically. (N/A) TRANSESOPHAGEAL ECHOCARDIOGRAM (TEE) (N/A)  SVG to Diag 1 SVG to OM1 SVG to PDA LIMA to LAD  SURGEON:  Surgeon(s) and Role:    * Delight OvensGerhardt, Zenora Karpel B, MD - Primary  PHYSICIAN ASSISTANT:  Jari Favreessa Conte, PA-C   ANESTHESIA:   general  EBL:  600 mL   BLOOD ADMINISTERED:none  DRAINS: ROUTINE   LOCAL MEDICATIONS USED:  NONE  SPECIMEN:  Source of Specimen:  AORTIC VALVE LEAFLETS  DISPOSITION OF SPECIMEN:  PATHOLOGY  COUNTS:  YES  DICTATION: .Dragon Dictation  PLAN OF CARE: Admit to inpatient   PATIENT DISPOSITION:  ICU - intubated and hemodynamically stable.   Delay start of Pharmacological VTE agent (>24hrs) due to surgical blood loss or risk of bleeding: yes

## 2017-09-09 NOTE — Transfer of Care (Signed)
Immediate Anesthesia Transfer of Care Note  Patient: Leonard Padilla  Procedure(s) Performed: AORTIC VALVE REPLACEMENT (AVR) using a 23 Edwards Perimount Magna Ease Aortic Valve (N/A ) CORONARY ARTERY BYPASS GRAFTING (CABG) x 4 using the left internal mammary artery and right greater saphenous vein harvested endoscopically. (N/A Chest) TRANSESOPHAGEAL ECHOCARDIOGRAM (TEE) (N/A )  Patient Location: SICU  Anesthesia Type:General  Level of Consciousness: sedated and Patient remains intubated per anesthesia plan  Airway & Oxygen Therapy: Patient remains intubated per anesthesia plan and Patient placed on Ventilator (see vital sign flow sheet for setting)  Post-op Assessment: Report given to RN and Post -op Vital signs reviewed and stable  Post vital signs: Reviewed and stable  Last Vitals:  Vitals:   09/09/17 0623 09/09/17 1513  BP: 140/66 (!) 113/50  Pulse: 72 99  Resp: 18 15  Temp: 36.6 C   SpO2: 97% 98%    Last Pain:  Vitals:   09/09/17 0623  TempSrc: Oral      Patients Stated Pain Goal: 7 (09/09/17 0603)  Complications: No apparent anesthesia complications

## 2017-09-09 NOTE — Progress Notes (Signed)
  Echocardiogram Echocardiogram Transesophageal has been performed.  Leta JunglingCooper, Jearld Hemp M 09/09/2017, 8:38 AM

## 2017-09-09 NOTE — Progress Notes (Signed)
RT NOTE:  CPAP/PS initiated 

## 2017-09-09 NOTE — Anesthesia Procedure Notes (Signed)
Central Venous Catheter Insertion Performed by: Annye Asa, MD, anesthesiologist Start/End2/11/2017 7:05 AM, 09/09/2017 7:22 AM Patient location: Pre-op. Preanesthetic checklist: patient identified, IV checked, risks and benefits discussed, surgical consent, monitors and equipment checked, pre-op evaluation, timeout performed and anesthesia consent Position: supine Lidocaine 1% used for infiltration and patient sedated Hand hygiene performed , maximum sterile barriers used  and Seldinger technique used Catheter size: 8.5 Fr PA cath was placed.Sheath introducer Swan type:thermodilution Procedure performed using ultrasound guided technique. Ultrasound Notes:anatomy identified, needle tip was noted to be adjacent to the nerve/plexus identified, no ultrasound evidence of intravascular and/or intraneural injection and image(s) printed for medical record Attempts: 1 Following insertion, line sutured, dressing applied and Biopatch. Post procedure assessment: blood return through all ports, free fluid flow and no air  Patient tolerated the procedure well with no immediate complications. Additional procedure comments: PA catheter:  Routine monitors. Timeout, sterile prep, drape, FBP R neck.  Supine position.  1% Lido local, finder and trocar RIJ 1st pass with US guidance.  Cordis placed over J wire. PA catheter in easily.  Sterile dressing applied.  Patient tolerated well, VSS.  Jenita Seashore, MD.

## 2017-09-09 NOTE — H&P (Signed)
[address]       [place] [address]             [phone]                    Leonard Padilla [facility] Medical Record #[id number] Date of Birth: 1946/01/16  Referring: Dr Swaziland Primary Care: Carlis Stable, [place] Primary Cardiologist: Garwin Brothers, MD  Chief Complaint:    Aortic stenosis    History of Present Illness:    GERREN Padilla 72 y.o. male is seen in the office   at the request of Dr. Swaziland following cardiac catheterization done January 18.  Patient gives a history over the past 6 months but worsening over the past 3 months of shortness of breath when walking up the hill from his mailbox he also has associated chest tightness with the shortness of breath.  This primarily occurs with exertion.  His wife noted at least one episode when he was moving chair became so tired that he laid on the floor for 10 or 15 minutes.  He specifically denies any syncopal episodes.  He has a history of a murmur since age 38.  Notes that he was accepted in [employer] with a known murmur. He has a previous history of stroke which occurred in February 2017 while in [place], manifest primarily with left arm and leg weakness which resolved over several months.  Subsequently returning to [facility] he underwent right carotid endarterectomy.  Patient denies diabetes, is a distant smoker but quit in 1983  He has known chronic kidney disease: Stage III  Current Activity/ Functional Status:  Patient is independent with mobility/ambulation, transfers, ADL's, IADL's.   Zubrod Score: At the time of surgery this patient's most appropriate activity status/level should be described as: []     0    Normal activity, no symptoms [x]     1    Restricted in physical strenuous activity but ambulatory, able to do out light work []     2    Ambulatory and capable of self care, unable to do work activities, up and about               >50 % of waking hours                               []     3    Only limited self care, in bed greater than 50% of waking hours []     4    Completely disabled, no self care, confined to bed or chair []     5    Moribund   Past Medical History:  Diagnosis Date  . Aortic stenosis    moderate AS 12/01/15 echo (Dr. Elberta Fortis)  . Arthritis   . CKD (chronic kidney disease) stage 3, GFR 30-59 ml/min (HCC) 08/04/2016  . CKD stage G3a/A1, GFR 45-59 and albumin creatinine ratio <30 mg/g (HCC) 08/04/2016  . Coronary artery disease   . Dysthymia 03/18/2017  . 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 Essential hypertension 10/13/2015  . Flu 09/2015   influenza A  . Former smoker, stopped smoking in distant past   . GERD (gastroesophageal reflux disease)    occ  . Heart murmur   . History of colon polyps   . Hyperlipidemia   . 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 Pneumonia 09/2015  . Postural dizziness with presyncope 08/27/2016  . Psoriasis   . Sleep apnea    to pick cpap tomorrow 12/13/15  . Stroke (HCC) 09/2015  . Symptomatic carotid artery stenosis 12/15/2015  . 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 Wears glasses     Past Surgical History:  Procedure Laterality Date  . COLONOSCOPY W/ BIOPSIES AND POLYPECTOMY    . ENDARTERECTOMY Right 12/15/2015   Procedure: RIGHT CAROTID ENDARTERECTOMY WITH PATCH ANGIOPLASTY;  Surgeon: Nada Libman, MD;  Location: [facility] OR;  Service: Vascular;  Laterality: Right;  . PATCH ANGIOPLASTY Right 12/15/2015   Procedure: RIGHT CAROTID PATCH ANGIOPLASTY;  Surgeon: Nada Libman, MD;  Location: [facility] OR;  Service: Vascular;  Laterality: Right;  . RIGHT/LEFT HEART CATH AND CORONARY ANGIOGRAPHY N/A 08/23/2017   Procedure: RIGHT/LEFT HEART CATH AND CORONARY ANGIOGRAPHY;  Surgeon: Swaziland, Peter M, MD;  Location: [facility] INVASIVE CV LAB;  Service: Cardiovascular;  Laterality: N/A;  . WISDOM TOOTH EXTRACTION      Family History  Problem Relation Age of Onset  . Cancer Mother          ovarian cancer  . Cancer Father        Prostate    Social History   Socioeconomic History  . Marital status: Married    Spouse name: [name]  . Number of children: 2  . Years of education: 9  . Highest education level: Not on file  Social Needs  . Financial resource strain: Not on file  . Food insecurity - worry: Not on file  . Food insecurity - inability: Not on file  . Transportation needs - medical: Not on file  . Transportation needs - non-medical: Not on file  Occupational History  . Occupation: Retired    Comment: retired Clinical biochemist, [employer]  Tobacco Use  . Smoking status: Former Smoker    Last attempt to quit: 08/07/1983    Years since quitting: 34.1  . Smokeless tobacco: Never Used  Substance and Sexual Activity  . Alcohol use: Yes    Comment: 1 glass of wine daily   . Drug use: No  . Sexual activity: Not Currently  Other Topics Concern  . Not on file  Social History Narrative   Lives with wife   Caffeine use- coffee 3-4 cups daily    Social History   Tobacco Use  Smoking Status Former Smoker  . Last attempt to quit: 08/07/1983  . Years since quitting: 34.1  Smokeless Tobacco Never Used    Social History   Substance and Sexual Activity  Alcohol Use Yes   Comment: 1 glass of wine daily      Allergies  Allergen Reactions  . Viagra [Sildenafil Citrate] Other (See Comments)    COLOR DISCRIMINATION [DOSE RELATED] "blue sensation"    Current Facility-Administered Medications  Medication Dose Route Frequency Provider Last Rate Last Dose  . 0.9 % irrigation (POUR BTL)    PRN Delight Ovens, MD   6,000 mL at 09/09/17 1610  . bisacodyl (DULCOLAX) EC tablet 5 mg  5 mg Oral Once Delight Ovens, MD      . cefUROXime (ZINACEF) 1.5 g in dextrose 5 % 50 mL IVPB  1.5 g Intravenous To OR Delight Ovens, MD      . cefUROXime (ZINACEF) 750 mg in dextrose 5 % 50 mL IVPB  750 mg Intravenous To OR Delight Ovens, MD      . chlorhexidine  (PERIDEX) 0.12 %  solution 15 mL  15 mL Mouth/Throat Once Delight Ovens, MD      . Melene Muller ON 09/10/2017] chlorhexidine (PERIDEX) 0.12 % solution 15 mL  15 mL Mouth/Throat Once Delight Ovens, MD      . Chlorhexidine Gluconate Cloth 2 % PADS 6 each  6 each Topical Once Delight Ovens, MD       And  . Chlorhexidine Gluconate Cloth 2 % PADS 6 each  6 each Topical Once Delight Ovens, MD      . dexmedetomidine (PRECEDEX) 400 MCG/100ML (4 mcg/mL) infusion  0.1-0.7 mcg/kg/hr Intravenous To OR Delight Ovens, MD      . DOPamine (INTROPIN) 800 mg in dextrose 5 % 250 mL (3.2 mg/mL) infusion  0-10 mcg/kg/min Intravenous To OR Delight Ovens, MD      . EPINEPHrine (ADRENALIN) 4 mg in dextrose 5 % 250 mL (0.016 mg/mL) infusion  0-10 mcg/min Intravenous To OR Delight Ovens, MD      . hemostatic agents    PRN Delight Ovens, MD   1 application at 09/09/17 [phone]  . heparin 2,500 Units, papaverine 30 mg in electrolyte-148 (PLASMALYTE-148) 500 mL irrigation   Irrigation To OR Delight Ovens, MD      . heparin 2,500 Units, papaverine 30 mg in electrolyte-148 (PLASMALYTE-148) 500 mL irrigation    PRN Delight Ovens, MD   500 mL at 09/09/17 0711  . heparin 30,000 units/NS 1000 mL solution for CELLSAVER   Other To OR Delight Ovens, MD      . insulin regular (NOVOLIN R,HUMULIN R) 100 Units in sodium chloride 0.9 % 100 mL (1 Units/mL) infusion   Intravenous To OR Delight Ovens, MD      . magnesium sulfate (IV Push/IM) injection 40 mEq  40 mEq Other To OR Delight Ovens, MD      . metoprolol tartrate (LOPRESSOR) tablet 12.5 mg  12.5 mg Oral Once Jairo Ben, MD      . Melene Muller ON 09/10/2017] metoprolol tartrate (LOPRESSOR) tablet 12.5 mg  12.5 mg Oral Once Delight Ovens, MD      . nitroGLYCERIN 50 mg in dextrose 5 % 250 mL (0.2 mg/mL) infusion  2-200 mcg/min Intravenous To OR Delight Ovens, MD      . phenylephrine (NEO-SYNEPHRINE) 20 mg in sodium chloride 0.9  % 250 mL (0.08 mg/mL) infusion  30-200 mcg/min Intravenous To OR Delight Ovens, MD      . potassium chloride injection 80 mEq  80 mEq Other To OR Delight Ovens, MD      . Surgifoam 1 Gm with 0.9% sodium chloride (4 ml) topical solution    PRN Delight Ovens, MD   4 mL at 09/09/17 1191  . temazepam (RESTORIL) capsule 15 mg  15 mg Oral Once PRN Delight Ovens, MD      . tranexamic acid (CYKLOKAPRON) 2,500 mg in sodium chloride 0.9 % 250 mL (10 mg/mL) infusion  1.5 mg/kg/hr Intravenous To OR Delight Ovens, MD      . tranexamic acid (CYKLOKAPRON) bolus via infusion - over 30 minutes 1,242 mg  15 mg/kg Intravenous To OR Delight Ovens, MD      . tranexamic acid (CYKLOKAPRON) pump prime solution 166 mg  2 mg/kg Intracatheter To OR Delight Ovens, MD      . vancomycin (VANCOCIN) 1,250 mg in sodium chloride 0.9 % 250 mL IVPB  1,250 mg Intravenous To OR  Delight Ovens, MD       Facility-Administered Medications Ordered in Other Encounters  Medication Dose Route Frequency Provider Last Rate Last Dose  . lactated ringers infusion    Continuous PRN Arnoldo Morale R, RN      . lactated ringers infusion    Continuous PRN Lysbeth Penner, RN        Pertinent items are noted in HPI.   Review of Systems:  Review of Systems  Constitutional: Positive for malaise/fatigue. Negative for chills, diaphoresis, fever and weight loss.  HENT: Negative.   Eyes: Negative.   Respiratory: Positive for shortness of breath. Negative for cough, hemoptysis, sputum production and wheezing.   Cardiovascular: Positive for chest pain and orthopnea. Negative for palpitations, claudication, leg swelling and PND.  Gastrointestinal: Negative for abdominal pain, blood in stool, constipation, diarrhea, heartburn, melena, nausea and vomiting.  Genitourinary: Positive for frequency. Negative for flank pain, hematuria and urgency.  Musculoskeletal: Positive for joint pain. Negative for back pain,  falls, myalgias and neck pain.  Skin: Positive for rash. Negative for itching.  Neurological: Positive for weakness. Negative for dizziness, tingling, tremors, sensory change, speech change, focal weakness, seizures, loss of consciousness and headaches.  Endo/Heme/Allergies: Negative for environmental allergies and polydipsia. Bruises/bleeds easily.  Psychiatric/Behavioral: Negative.    Dental: Patient gets regular dental care, is unaware of any current issues Immunizations: Flu up to date [ YES]; Pneumococcal up to date [ YES ];   Physical Exam: BP 140/66   Pulse 72   Temp 97.9 F (36.6 C) (Oral)   Resp 18   SpO2 97%   PHYSICAL EXAMINATION: General appearance: alert, cooperative and appears older than stated age Head: Normocephalic, without obvious abnormality, atraumatic Neck: no adenopathy, no carotid bruit, no JVD, supple, symmetrical, trachea midline and thyroid not enlarged, symmetric, no tenderness/mass/nodules Lymph nodes: Cervical, supraclavicular, and axillary nodes normal. Resp: clear to auscultation bilaterally Back: symmetric, no curvature. ROM normal. No CVA tenderness. Cardio: systolic murmur: holosystolic 3/6, crescendo throughout the precordium GI: soft, non-tender; bowel sounds normal; no masses,  no organomegaly Extremities: extremities normal, atraumatic, no cyanosis or edema Neurologic: Grossly normal Patient has 1+ DP and PT pulses bilaterally Healed right carotid endarterectomy scar  Diagnostic Studies & Laboratory data:     Recent Radiology Findings:   Dg Chest 2 View  Result Date: 09/04/2017 CLINICAL DATA:  Preop cardiac surgery. Coronary artery disease. Aortic valve stenosis. EXAM: CHEST  2 VIEW COMPARISON:  08/12/2017 FINDINGS: The cardiomediastinal silhouette is within normal limits. Aortic atherosclerosis is noted. The lungs are clear. No pleural effusion or pneumothorax is identified. No acute osseous abnormality is seen. IMPRESSION: No active  cardiopulmonary disease. Electronically Signed   By: Sebastian Ache M.D.   On: 09/04/2017 13:57     I have independently reviewed the above radiology studies  and reviewed the findings with the patient.   Recent Lab Findings: Lab Results  Component Value Date   WBC 7.2 09/04/2017   HGB 13.2 09/04/2017   HCT 39.9 09/04/2017   PLT 136 (L) 09/04/2017   GLUCOSE 96 09/04/2017   CHOL 190 04/24/2017   TRIG 140 04/24/2017   HDL 69 04/24/2017   LDLCALC 115 (H) 08/03/2016   ALT 25 09/04/2017   AST 24 09/04/2017   NA 134 (L) 09/04/2017   K 4.1 09/04/2017   CL 103 09/04/2017   CREATININE 1.21 09/04/2017   BUN 27 (H) 09/04/2017   CO2 19 (L) 09/04/2017   TSH 3.28 08/03/2016  INR 1.00 09/04/2017   HGBA1C 5.5 09/04/2017   ECHO: Transthoracic Echocardiography  Patient:    Kaito, [place] MR #:       [id number] Study Date: 08/27/2017 Gender:     M Age:        71 Height:     174 cm Weight:     83.9 kg BSA:        2.03 m^2 Pt. Status: Room:   ATTENDING    Revankar, Elby Showers R  ORDERING     Belva Crome, MD  REFERRING    Belva Crome, MD  PERFORMING   [facility], Outpatient  SONOGRAPHER  Tobin Chad  cc:  ------------------------------------------------------------------- LV EF: 65% -   70%  ------------------------------------------------------------------- Indications:      (Angina pectoris).  ------------------------------------------------------------------- History:   PMH:  Systolic murmur CKD Moderate aortic stenosis Chest pain.  Risk factors:  Hypertension.  ------------------------------------------------------------------- Study Conclusions  - Left ventricle: The cavity size was normal. There was mild focal   basal hypertrophy of the septum. Systolic function was vigorous.   The estimated ejection fraction was in the range of 65% to 70%.   Wall motion was normal; there were no regional wall motion   abnormalities. Doppler parameters are consistent with  abnormal   left ventricular relaxation (grade 1 diastolic dysfunction). - Aortic valve: Valve mobility was restricted. There was severe   stenosis. There was mild regurgitation. Peak velocity (S): 442   cm/s. Mean gradient (S): 50 mm Hg. Valve area (VTI): 0.82 cm^2.   Valve area (Vmax): 0.73 cm^2. Valve area (Vmean): 0.76 cm^2. - Mitral valve: There was mild regurgitation.  ------------------------------------------------------------------- Study data:  Comparison was made to the study of 12/01/2015.  Study status:  Routine.  Procedure:  Technically difficult subcostal views. The patient reported no pain pre or post test. Transthoracic echocardiography. Image quality was adequate.  Study completion: There were no complications.          Transthoracic echocardiography.  M-mode, complete 2D, spectral Doppler, and color Doppler.  Birthdate:  Patient birthdate: [birth year].  Age:  Patient is 72 yr old.  Sex:  Gender: male.    BMI: 27.7 kg/m^2.  Blood pressure:     110/60  Patient status:  Outpatient.  Study date: Study date: 08/27/2017. Study time: 09:54 AM.  Location:  Echo laboratory.  -------------------------------------------------------------------  ------------------------------------------------------------------- Left ventricle:  The cavity size was normal. There was mild focal basal hypertrophy of the septum. Systolic function was vigorous. The estimated ejection fraction was in the range of 65% to 70%. Wall motion was normal; there were no regional wall motion abnormalities. Doppler parameters are consistent with abnormal left ventricular relaxation (grade 1 diastolic dysfunction).  ------------------------------------------------------------------- Aortic valve:   Trileaflet; moderately thickened, moderately calcified leaflets. Valve mobility was restricted.  Doppler: There was severe stenosis.   There was mild regurgitation.    VTI ratio of LVOT to aortic valve: 0.29.  Valve area (VTI): 0.82 cm^2. Indexed valve area (VTI): 0.4 cm^2/m^2. Peak velocity ratio of LVOT to aortic valve: 0.26. Valve area (Vmax): 0.73 cm^2. Indexed valve area (Vmax): 0.36 cm^2/m^2. Mean velocity ratio of LVOT to aortic valve: 0.27. Valve area (Vmean): 0.76 cm^2. Indexed valve area (Vmean): 0.38 cm^2/m^2.    Mean gradient (S): 50 mm Hg. Peak gradient (S): 78 mm Hg.  ------------------------------------------------------------------- Aorta:  Aortic root: The aortic root was normal in size.  ------------------------------------------------------------------- Mitral valve:   Structurally normal valve.   Mobility was not restricted.  Doppler:  Transvalvular velocity  was within the normal range. There was no evidence for stenosis. There was mild regurgitation.    Peak gradient (D): 3 mm Hg.  ------------------------------------------------------------------- Left atrium:  The atrium was normal in size.  ------------------------------------------------------------------- Right ventricle:  The cavity size was normal. Wall thickness was normal. Systolic function was normal.  ------------------------------------------------------------------- Pulmonic valve:   Poorly visualized.  Structurally normal valve. Cusp separation was normal.  Doppler:  Transvalvular velocity was within the normal range. There was no evidence for stenosis. There was no regurgitation.  ------------------------------------------------------------------- Tricuspid valve:   Structurally normal valve.    Doppler: Transvalvular velocity was within the normal range. There was no regurgitation.  ------------------------------------------------------------------- Pulmonary artery:   The main pulmonary artery was normal-sized. Systolic pressure was within the normal range.  ------------------------------------------------------------------- Right atrium:  The atrium was normal in  size.  ------------------------------------------------------------------- Pericardium:  There was no pericardial effusion.  ------------------------------------------------------------------- Systemic veins: Inferior vena cava: The vessel was normal in size.  ------------------------------------------------------------------- Measurements   Left ventricle                           Value          Reference  LV ID, ED, PLAX chordal           (L)    41.1  mm       43 - 52  LV ID, ES, PLAX chordal                  25    mm       23 - 38  LV fx shortening, PLAX chordal           39    %        >=29  LV PW thickness, ED                      11.6  mm       ----------  IVS/LV PW ratio, ED                      1.03           <=1.3  Stroke volume, 2D                        80    ml       ----------  Stroke volume/bsa, 2D                    39    ml/m^2   ----------  LV e&', lateral                           9.32  cm/s     ----------  LV E/e&', lateral                         9.31           ----------  LV e&', medial                            8.38  cm/s     ----------  LV E/e&', medial                          10.36          ----------  LV e&', average                           8.85  cm/s     ----------  LV E/e&', average                         9.81           ----------  Longitudinal strain, TDI                 22    %        ----------    Ventricular septum                       Value          Reference  IVS thickness, ED                        12    mm       ----------    LVOT                                     Value          Reference  LVOT ID, S                               19    mm       ----------  LVOT area                                2.84  cm^2     ----------  LVOT peak velocity, S                    114   cm/s     ----------  LVOT mean velocity, S                    79.5  cm/s     ----------  LVOT VTI, S                              28.3  cm       ----------  LVOT peak  gradient, S                    5     mm Hg    ----------    Aortic valve                             Value          Reference  Aortic valve peak velocity, S            442   cm/s     ----------  Aortic valve mean velocity, S            296   cm/s     ----------  Aortic valve VTI, S                      97.5  cm       ----------  Aortic mean gradient, S                  50    mm Hg    ----------  Aortic peak gradient, S                  78    mm Hg    ----------  VTI ratio, LVOT/AV                       0.29           ----------  Aortic valve area, VTI                   0.82  cm^2     ----------  Aortic valve area/bsa, VTI               0.4   cm^2/m^2 ----------  Velocity ratio, peak, LVOT/AV            0.26           ----------  Aortic valve area, peak velocity         0.73  cm^2     ----------  Aortic valve area/bsa, peak              0.36  cm^2/m^2 ----------  velocity  Velocity ratio, mean, LVOT/AV            0.27           ----------  Aortic valve area, mean velocity         0.76  cm^2     ----------  Aortic valve area/bsa, mean              0.38  cm^2/m^2 ----------  velocity  Aortic regurg pressure half-time         364   ms       ----------    Aorta                                    Value          Reference  Aortic root ID, ED                       30    mm       ----------    Left atrium                              Value          Reference  LA ID, A-P, ES                           45    mm       ----------  LA ID/bsa, A-P                    (H)    2.22  cm/m^2   <=2.2  LA volume, S                             65.2  ml       ----------  LA volume/bsa, S  32.1  ml/m^2   ----------  LA volume, ES, 1-p A4C                   57.3  ml       ----------  LA volume/bsa, ES, 1-p A4C               28.2  ml/m^2   ----------  LA volume, ES, 1-p A2C                   73.2  ml       ----------  LA volume/bsa, ES, 1-p A2C               36    ml/m^2   ----------     Mitral valve                             Value          Reference  Mitral E-wave peak velocity              86.8  cm/s     ----------  Mitral A-wave peak velocity              114   cm/s     ----------  Mitral deceleration time          (L)    148   ms       150 - 230  Mitral peak gradient, D                  3     mm Hg    ----------  Mitral E/A ratio, peak                   0.8            ----------    Right atrium                             Value          Reference  RA ID, S-I, ES, A4C               (H)    54.4  mm       34 - 49  RA area, ES, A4C                         17.6  cm^2     8.3 - 19.5  RA volume, ES, A/L                       48    ml       ----------  RA volume/bsa, ES, A/L                   23.6  ml/m^2   ----------    Systemic veins                           Value          Reference  Estimated CVP                            3     mm Hg    ----------    Right ventricle  Value          Reference  TAPSE                                    17.1  mm       ----------  RV s&', lateral, S                        13.7  cm/s     ----------  Legend: (L)  and  (H)  mark values outside specified reference range.  ------------------------------------------------------------------- Prepared and Electronically Authenticated by  Donato Schultz, M.D. 2019-01-22T12:40:55  CATH:  RIGHT/LEFT HEART CATH AND CORONARY ANGIOGRAPHY  Conclusion     Mid LM lesion is 50% stenosed.  Prox LAD-1 lesion is 60% stenosed.  Prox LAD-2 lesion is 70% stenosed.  Ost 1st Mrg lesion is 80% stenosed.  1st Mrg lesion is 80% stenosed.  Ost RCA to Prox RCA lesion is 85% stenosed.  Mid RCA to Dist RCA lesion is 30% stenosed.  LV end diastolic pressure is normal.  There is severe aortic valve stenosis.   1. 3 vessel obstructive CAD 2. Normal LV filling pressures. 3. Severe aortic stenosis- mean gradient 46 mm Hg. AV area 0.9 cm squared.  4. Normal right heart  pressures. 5. Normal cardiac output.  Plan: check Echo. Refer to CT surgery for consideration of AVR/CABG     I have independently reviewed the above  cath films and reviewed the findings with the  patient .   Chronic Kidney Disease   Stage I     GFR >90  Stage II    GFR 60-89  Stage IIIA GFR 45-59  Stage IIIB GFR 30-44  Stage IV   GFR 15-29  Stage V    GFR  <15  Lab Results  Component Value Date   CREATININE 1.21 09/04/2017   Estimated Creatinine Clearance: 53.5 mL/min (by C-G formula based on SCr of 1.21 mg/dL).   Assessment / Plan:   Patient with progressive symptoms of coronary occlusive disease and critical aortic stenosis, with calculated valve area of 0.9 cm, Mean gradient (S): 50 mm Hg. Peak gradient (S): 78 mm Hg.  By echo estimate.  I discussed with the patient and his wife the diagnosis of concomitant three-vessel coronary artery disease in the setting of critical aortic stenosis, symptomatic.  I recommended to the patient that we proceed in a very timely manner to complete his evaluation and proceed with coronary artery bypass grafting and aortic valve replacement.  We discussed the pros and cons of mechanical versus tissue valve replacement.  Patient is agreeable with a tissue valve to avoid lifelong anticoagulation. The goals risks and alternatives of the planned surgical procedure coronary artery bypass grafting and aortic valve replacement have been discussed with the patient in detail. The risks of the procedure including death, infection, stroke, myocardial infarction, bleeding, blood transfusion, worsening of his renal function , heart block and need for permanent pacemaker have all been discussed specifically.  I have quoted Bertha Stakes a 4 % of perioperative mortality and a complication rate as high as 30%. The patient's and his wife's questions have been answered.KSHAWN CANAL is willing  to proceed with the planned procedure.   Patient had been on Plavix but this  is been held since his heart catheterization, he notes that he has not resumed.  I  spent  60 minutes counseling the patient face to face.   Delight Ovens MD      [address] [place] [place] [address] Office [phone]   Beeper [phone]  09/09/2017 7:13 AM

## 2017-09-09 NOTE — Anesthesia Postprocedure Evaluation (Signed)
Anesthesia Post Note  Patient: Leonard Padilla  Procedure(s) Performed: AORTIC VALVE REPLACEMENT (AVR) using a 23 Edwards Perimount Magna Ease Aortic Valve (N/A ) CORONARY ARTERY BYPASS GRAFTING (CABG) x 4 using the left internal mammary artery and right greater saphenous vein harvested endoscopically. (N/A Chest) TRANSESOPHAGEAL ECHOCARDIOGRAM (TEE) (N/A )     Patient location during evaluation: SICU Anesthesia Type: General Level of consciousness: sedated Pain management: pain level controlled Vital Signs Assessment: post-procedure vital signs reviewed and stable Respiratory status: patient remains intubated per anesthesia plan Cardiovascular status: stable Postop Assessment: no apparent nausea or vomiting Anesthetic complications: no    Last Vitals:  Vitals:   09/09/17 1700 09/09/17 1730  BP:    Pulse: 82 83  Resp: 12 (!) 21  Temp: (!) 36.2 C (!) 36.4 C  SpO2: 100% 100%    Last Pain:  Vitals:   09/09/17 1510  TempSrc: Core                 Devetta Hagenow P Mashanda Ishibashi

## 2017-09-10 ENCOUNTER — Inpatient Hospital Stay (HOSPITAL_COMMUNITY): Payer: Medicare Other

## 2017-09-10 ENCOUNTER — Encounter (HOSPITAL_COMMUNITY): Payer: Self-pay | Admitting: Cardiothoracic Surgery

## 2017-09-10 LAB — CBC
HCT: 25.1 % — ABNORMAL LOW (ref 39.0–52.0)
HCT: 23.2 % — ABNORMAL LOW (ref 39.0–52.0)
Hemoglobin: 8.5 g/dL — ABNORMAL LOW (ref 13.0–17.0)
Hemoglobin: 7.7 g/dL — ABNORMAL LOW (ref 13.0–17.0)
MCH: 31.3 pg (ref 26.0–34.0)
MCH: 31.3 pg (ref 26.0–34.0)
MCHC: 33.9 g/dL (ref 30.0–36.0)
MCHC: 33.2 g/dL (ref 30.0–36.0)
MCV: 92.3 fL (ref 78.0–100.0)
MCV: 94.3 fL (ref 78.0–100.0)
Platelets: 126 10*3/uL — ABNORMAL LOW (ref 150–400)
Platelets: 108 10*3/uL — ABNORMAL LOW (ref 150–400)
RBC: 2.72 MIL/uL — ABNORMAL LOW (ref 4.22–5.81)
RBC: 2.46 MIL/uL — ABNORMAL LOW (ref 4.22–5.81)
RDW: 12.4 % (ref 11.5–15.5)
RDW: 13.1 % (ref 11.5–15.5)
WBC: 12.3 10*3/uL — ABNORMAL HIGH (ref 4.0–10.5)
WBC: 12.5 10*3/uL — ABNORMAL HIGH (ref 4.0–10.5)

## 2017-09-10 LAB — POCT I-STAT, CHEM 8
BUN: 23 mg/dL — ABNORMAL HIGH (ref 6–20)
Calcium, Ion: 1.25 mmol/L (ref 1.15–1.40)
Chloride: 103 mmol/L (ref 101–111)
Creatinine, Ser: 1.1 mg/dL (ref 0.61–1.24)
Glucose, Bld: 135 mg/dL — ABNORMAL HIGH (ref 65–99)
HCT: 22 % — ABNORMAL LOW (ref 39.0–52.0)
Hemoglobin: 7.5 g/dL — ABNORMAL LOW (ref 13.0–17.0)
Potassium: 4.3 mmol/L (ref 3.5–5.1)
Sodium: 137 mmol/L (ref 135–145)
TCO2: 20 mmol/L — ABNORMAL LOW (ref 22–32)

## 2017-09-10 LAB — BASIC METABOLIC PANEL
Anion gap: 9 (ref 5–15)
BUN: 20 mg/dL (ref 6–20)
CO2: 20 mmol/L — ABNORMAL LOW (ref 22–32)
Calcium: 8.3 mg/dL — ABNORMAL LOW (ref 8.9–10.3)
Chloride: 108 mmol/L (ref 101–111)
Creatinine, Ser: 1.29 mg/dL — ABNORMAL HIGH (ref 0.61–1.24)
GFR calc Af Amer: >60 mL/min (ref >60)
GFR calc non Af Amer: 53 mL/min — ABNORMAL LOW (ref >60)
Glucose, Bld: 115 mg/dL — ABNORMAL HIGH (ref 65–99)
Potassium: 4.3 mmol/L (ref 3.5–5.1)
Sodium: 137 mmol/L (ref 135–145)

## 2017-09-10 LAB — PREPARE RBC (CROSSMATCH)

## 2017-09-10 LAB — GLUCOSE, CAPILLARY
Glucose-Capillary: 104 mg/dL — ABNORMAL HIGH (ref 65–99)
Glucose-Capillary: 110 mg/dL — ABNORMAL HIGH (ref 65–99)
Glucose-Capillary: 112 mg/dL — ABNORMAL HIGH (ref 65–99)
Glucose-Capillary: 113 mg/dL — ABNORMAL HIGH (ref 65–99)
Glucose-Capillary: 114 mg/dL — ABNORMAL HIGH (ref 65–99)
Glucose-Capillary: 115 mg/dL — ABNORMAL HIGH (ref 65–99)
Glucose-Capillary: 117 mg/dL — ABNORMAL HIGH (ref 65–99)
Glucose-Capillary: 117 mg/dL — ABNORMAL HIGH (ref 65–99)
Glucose-Capillary: 118 mg/dL — ABNORMAL HIGH (ref 65–99)
Glucose-Capillary: 118 mg/dL — ABNORMAL HIGH (ref 65–99)
Glucose-Capillary: 119 mg/dL — ABNORMAL HIGH (ref 65–99)
Glucose-Capillary: 120 mg/dL — ABNORMAL HIGH (ref 65–99)
Glucose-Capillary: 120 mg/dL — ABNORMAL HIGH (ref 65–99)
Glucose-Capillary: 121 mg/dL — ABNORMAL HIGH (ref 65–99)
Glucose-Capillary: 124 mg/dL — ABNORMAL HIGH (ref 65–99)
Glucose-Capillary: 135 mg/dL — ABNORMAL HIGH (ref 65–99)
Glucose-Capillary: 140 mg/dL — ABNORMAL HIGH (ref 65–99)
Glucose-Capillary: 86 mg/dL (ref 65–99)

## 2017-09-10 LAB — CREATININE, SERUM
Creatinine, Ser: 1.31 mg/dL — ABNORMAL HIGH (ref 0.61–1.24)
GFR calc Af Amer: >60 mL/min (ref >60)
GFR calc non Af Amer: 52 mL/min — ABNORMAL LOW (ref >60)

## 2017-09-10 LAB — POCT I-STAT 4, (NA,K, GLUC, HGB,HCT)
Glucose, Bld: 93 mg/dL (ref 65–99)
HCT: 27 % — ABNORMAL LOW (ref 39.0–52.0)
Hemoglobin: 9.2 g/dL — ABNORMAL LOW (ref 13.0–17.0)
Potassium: 3.8 mmol/L (ref 3.5–5.1)
Sodium: 142 mmol/L (ref 135–145)

## 2017-09-10 LAB — MAGNESIUM
Magnesium: 2.2 mg/dL (ref 1.7–2.4)
Magnesium: 2.3 mg/dL (ref 1.7–2.4)

## 2017-09-10 MED ORDER — CHLORHEXIDINE GLUCONATE CLOTH 2 % EX PADS
6.0000 | MEDICATED_PAD | Freq: Every day | CUTANEOUS | Status: DC
  Administered 2017-09-10 – 2017-09-14 (×5): 6 via TOPICAL

## 2017-09-10 MED ORDER — ENOXAPARIN SODIUM 30 MG/0.3ML ~~LOC~~ SOLN: 30.0000 mg | Freq: Every day | SUBCUTANEOUS | Status: DC

## 2017-09-10 MED ORDER — FUROSEMIDE 10 MG/ML IJ SOLN
40.0000 mg | Freq: Once | INTRAMUSCULAR | Status: AC
  Administered 2017-09-10: 40 mg via INTRAVENOUS
  Filled 2017-09-10: qty 4

## 2017-09-10 MED ORDER — INSULIN DETEMIR 100 UNIT/ML ~~LOC~~ SOLN
10.0000 [IU] | Freq: Once | SUBCUTANEOUS | Status: AC
  Administered 2017-09-10: 10 [IU] via SUBCUTANEOUS
  Filled 2017-09-10: qty 0.1

## 2017-09-10 MED ORDER — MUPIROCIN 2 % EX OINT
1.0000 "application " | TOPICAL_OINTMENT | Freq: Two times a day (BID) | CUTANEOUS | Status: AC
  Administered 2017-09-12 – 2017-09-15 (×7): 1 via NASAL
  Filled 2017-09-10 (×3): qty 22

## 2017-09-10 MED ORDER — ENOXAPARIN SODIUM 30 MG/0.3ML ~~LOC~~ SOLN
30.0000 mg | SUBCUTANEOUS | Status: DC
  Administered 2017-09-10 – 2017-09-15 (×6): 30 mg via SUBCUTANEOUS
  Filled 2017-09-10 (×6): qty 0.3

## 2017-09-10 MED ORDER — INSULIN ASPART 100 UNIT/ML ~~LOC~~ SOLN
0.0000 [IU] | SUBCUTANEOUS | Status: DC
  Administered 2017-09-11: 2 [IU] via SUBCUTANEOUS
  Administered 2017-09-15: 4 [IU] via SUBCUTANEOUS
  Administered 2017-09-15 (×3): 2 [IU] via SUBCUTANEOUS

## 2017-09-10 MED FILL — Potassium Chloride Inj 2 mEq/ML: INTRAVENOUS | Qty: 40 | Status: AC

## 2017-09-10 MED FILL — Magnesium Sulfate Inj 50%: INTRAMUSCULAR | Qty: 10 | Status: AC

## 2017-09-10 MED FILL — Heparin Sodium (Porcine) Inj 1000 Unit/ML: INTRAMUSCULAR | Qty: 30 | Status: AC

## 2017-09-10 NOTE — Plan of Care (Signed)
  Progressing Education: Knowledge of General Education information will improve 09/10/2017 0604 - Progressing by Joycelyn ManHimes, Taysom Glymph M, RN Health Behavior/Discharge Planning: Ability to manage health-related needs will improve 09/10/2017 0604 - Progressing by Joycelyn ManHimes, Naba Sneed M, RN Clinical Measurements: Ability to maintain clinical measurements within normal limits will improve 09/10/2017 0604 - Progressing by Joycelyn ManHimes, Riata Ikeda M, RN Will remain free from infection 09/10/2017 0604 - Progressing by Joycelyn ManHimes, Laycie Schriner M, RN Diagnostic test results will improve 09/10/2017 0604 - Progressing by Joycelyn ManHimes, Ayauna Mcnay M, RN Respiratory complications will improve 09/10/2017 0604 - Progressing by Joycelyn ManHimes, Helon Wisinski M, RN Cardiovascular complication will be avoided 09/10/2017 0604 - Progressing by Joycelyn ManHimes, Aliyha Fornes M, RN Activity: Risk for activity intolerance will decrease 09/10/2017 0604 - Progressing by Joycelyn ManHimes, Kamalei Roeder M, RN Nutrition: Adequate nutrition will be maintained 09/10/2017 0604 - Progressing by Joycelyn ManHimes, Tadhg Eskew M, RN Coping: Level of anxiety will decrease 09/10/2017 0604 - Progressing by Joycelyn ManHimes, Aleksei Goodlin M, RN Elimination: Will not experience complications related to bowel motility 09/10/2017 0604 - Progressing by Joycelyn ManHimes, Zoeya Gramajo M, RN Will not experience complications related to urinary retention 09/10/2017 0604 - Progressing by Joycelyn ManHimes, Rolf Fells M, RN Pain Managment: General experience of comfort will improve 09/10/2017 0604 - Progressing by Joycelyn ManHimes, Ceciley Buist M, RN Safety: Ability to remain free from injury will improve 09/10/2017 0604 - Progressing by Joycelyn ManHimes, Antino Mayabb M, RN Skin Integrity: Risk for impaired skin integrity will decrease 09/10/2017 0604 - Progressing by Joycelyn ManHimes, Jawara Latorre M, RN Education: Ability to demonstrate proper wound care will improve 09/10/2017 0604 - Progressing by Joycelyn ManHimes, Annalissa Murphey M, RN Knowledge of disease or condition will improve 09/10/2017 0604 - Progressing by Joycelyn ManHimes, Tex Conroy M, RN Knowledge of  the prescribed therapeutic regimen will improve 09/10/2017 0604 - Progressing by Joycelyn ManHimes, Keo Schirmer M, RN Activity: Risk for activity intolerance will decrease 09/10/2017 0604 - Progressing by Joycelyn ManHimes, Sera Hitsman M, RN Cardiac: Hemodynamic stability will improve 09/10/2017 0604 - Progressing by Joycelyn ManHimes, Junah Yam M, RN Clinical Measurements: Postoperative complications will be avoided or minimized 09/10/2017 0604 - Progressing by Joycelyn ManHimes, Miyo Aina M, RN Respiratory: Respiratory status will improve 09/10/2017 0604 - Progressing by Joycelyn ManHimes, Terrence Pizana M, RN Skin Integrity: Wound healing without signs and symptoms of infection 09/10/2017 0604 - Progressing by Joycelyn ManHimes, Davyon Fisch M, RN Risk for impaired skin integrity will decrease 09/10/2017 0604 - Progressing by Joycelyn ManHimes, Keiara Sneeringer M, RN Urinary Elimination: Ability to achieve and maintain adequate renal perfusion and functioning will improve 09/10/2017 0604 - Progressing by Joycelyn ManHimes, Paolo Okane M, RN

## 2017-09-10 NOTE — Op Note (Signed)
NAME:  Leonard Padilla, Leonard Padilla NO.:  MEDICAL RECORD NO.:  [id number]  LOCATION:                                 FACILITY:  PHYSICIAN:  Sheliah Plane, MD         DATE OF BIRTH:  DATE OF PROCEDURE:  09/09/2017 DATE OF DISCHARGE:                              OPERATIVE REPORT   PREOPERATIVE DIAGNOSIS:  Critical aortic stenosis with severe 3-vessel coronary artery disease, unstable angina.  POSTOPERATIVE DIAGNOSIS:  Critical aortic stenosis with severe 3-vessel coronary artery disease, unstable angina.  PROCEDURE PERFORMED:  Aortic valve replacement with pericardial tissue valve Edwards Lifesciences 23 mm, model 3300TFX, serial #[id number], and coronary artery bypass grafting x4 with the left internal mammary to the left anterior descending coronary artery, reverse saphenous vein graft to the diagonal coronary artery, reverse saphenous vein graft to the obtuse marginal coronary artery, reverse saphenous vein graft to the posterior descending coronary artery with right greater saphenous thigh and calf endo vein harvesting.  SURGEON:  Sheliah Plane, MD.  FIRST ASSISTANT:  Jari Favre, [place].  BRIEF HISTORY:  The patient is a 72 year old male with a previous history of known cerebrovascular disease with previous history of stroke and right carotid endarterectomy.  He presents with escalating faintness and weakness with exertion, chest discomfort with exertion, and shortness of breath.  He had a known murmur for many years. Preoperatively, he was evaluated with cardiac cath and echocardiogram and found to have severe 3-vessel disease with greater than 80% stenosis of the LAD, diagonal, OM and right coronary arteries.  Overall, ventricular function was preserved.  He had critical aortic stenosis including the aortic valve, Vmax of 441 cm second, maximum gradient 78 mmHg and mean 50, calculated aortic valve area of 0.79 cm squared.  The patient has stage III renal  insufficiency.  After reviewing the risks and options of surgery with the patient and with the critical nature of his coronary artery disease, symptoms and critical aortic stenosis, he was agreeable with proceeding with coronary artery bypass grafting and aortic valve replacement.  Mechanical versus tissue valve was discussed with him.  He preferred a tissue valve.  DESCRIPTION OF PROCEDURE:  With Swan-Ganz and arterial line monitors placed, the patient underwent general endotracheal anesthesia without incident.  Skin of the chest and legs was prepped with Betadine and draped in usual sterile manner.  Appropriate time-out was performed and we proceeded with endoscopic vein harvesting of the right greater saphenous vein from the thigh and calf.  The vein was of good quality and caliber.  Median sternotomy was performed.  Left internal mammary artery was dissected down as a pedicle graft.  The distal artery was divided and had good free flow.  Pericardium was opened.  The patient had evidence of left ventricular hypertrophy.  His aorta was of normal caliber.  He was systemically heparinized.  Ascending aorta was cannulated.  The right atrium was cannulated with a dual-stage venous cannula.  Retrograde cardioplegia catheter was placed and confirmed with TEE in the coronary sinus.  The patient was then placed on cardiopulmonary bypass 2.4 L/min/meter squared.  Sites of anastomosis were  selected and dissected out of the epicardium.  The right superior pulmonary vein vent was placed.  An aortic root vent cardioplegia needle was introduced into the ascending aorta.  The patient's body temperature was cooled to 32 degrees.  Aortic crossclamp was applied, 500 mL of cold blood potassium cardioplegia was administered antegrade.  Additional cold blood cardioplegia was administered retrograde.  We turned our attention first to the coronary arteries.  The posterior descending coronary artery was  opened.  The vessel was somewhat thickened and 1.5 mm probe did pass distally.  Using a running 7-0 Prolene, distal anastomosis was performed with a segment of reverse saphenous vein graft.  The heart was then elevated.  The first obtuse marginal vessel was the primary supplier of the lateral wall.  This vessel was opened, admitted a 1.5 mm probe distally.  Using a running 7- 0 Prolene, distal anastomosis was performed.  The diagonal coronary artery was somewhat small and diseased.  The vessel was opened, admitted a 1 mm probe.  Using a running 7-0 Prolene, distal anastomosis was performed with a segment of reversed saphenous vein graft.  Attention was then turned to the left anterior descending coronary artery between the mid and distal third of the vessel.  The LAD was opened and it was partially intramyocardial.  Using a running 8-0 Prolene the left internal mammary artery was anastomosed to the left anterior descending coronary.Retrograde cold blood cardioplegia was administered as we proceeded.  With the coronary grafts completed, we then moved to the aortic valve and transverse aortotomy was performed. This gave good visualization of trileaflet aortic valve that was significantly calcified.  The valves were excised and annulus was debrided.  Care was taken to remove all loose calcific debris.  The annulus was sized for a 23 pericardial tissue valve.  The valve was selected and washed.  A 14, #2 TiCron pledgeted sutures with the pledgets on the ventricular surface were placed.  These were used to secure the valve and seat the valve in place.  The valve seated well. There was no interference of the right or left coronary ostium.  The right coronary ostium did have ostial stenosis.  With the valve well seated, the aortotomy was closed over felt strips with horizontal mattress 4-0 Prolene suture and a second layer of running simple 4-0 suture.  Aortic root vent cardioplegia needle was  removed, 3 punch aortotomies were performed and each of the 3 vein grafts were anastomosed to the ascending aorta.  The heart was allowed to passively fill and de-air.  CO2 had been insufflated in the pericardial cavity throughout the procedure.  The bulldog on mammary artery was removed with prompt rise in myocardial septal temperature.  A hot shot of retrograde warm cardioplegia was given.  The aortic cross-clamp was removed with total cross-clamp time of 168 minutes.  The patient's body temperature was rewarmed to 37 degrees.  While on pump, his urine output had been low and had been on 3 mcg of dopamine.  This was continued. Atrial and ventricular pacing wires were applied.  An 18-gauge needle was introduced in the left ventricular apex to further de-air the heart on TEE.  All air bubbles appeared to have been cleared.  The valve was functioning well.  Right superior pulmonary vein and retrograde cardioplegia catheters were removed.  The patient was then ventilated and weaned from cardiopulmonary bypass initially with DDD pacing, but his own rhythm returned and ultimately the pacer was turned off.  He remained  hemodynamically stable.  He was decannulated in usual fashion. Protamine sulfate was administered with operative field hemostatic. Bilateral chest tubes were placed and a Blake mediastinal drain. Pericardium was loosely reapproximated.  Sternum was closed with #6 stainless steel wire.  Fascia was closed with interrupted 0 Vicryl and 3- 0 Vicryl subcutaneous tissue through a subcuticular stitch in skin edges.  Dry dressings were applied.  Sponge and needle count was reported as correct at the completion of the procedure.  The patient did not require any blood products during the operative procedure.  At the completion of the procedure; the Vmax was 232 cm second, peak gradient 22, mean gradient 8, aortic valve area calculated 3.02.  Total pump time was 213  minutes.     Sheliah PlaneEdward Shandelle Borrelli, MD     EG/MEDQ  D:  09/10/2017  T:  09/10/2017  Job:  [id number]  cc:   Aundra Dubinajan R. Revankar, M.D.

## 2017-09-10 NOTE — Progress Notes (Signed)
[facility] BRIEF SICU PROGRESS NOTE  1 Day Post-Op  S/P Procedure(s) (LRB): AORTIC VALVE REPLACEMENT (AVR) using a 23 Edwards Perimount Magna Ease Aortic Valve (N/A) CORONARY ARTERY BYPASS GRAFTING (CABG) x 4 using the left internal mammary artery and right greater saphenous vein harvested endoscopically. (N/A) TRANSESOPHAGEAL ECHOCARDIOGRAM (TEE) (N/A)   Stable day NSR w/ stable BP Breathing comfortably w/ O2 sats 98-100% Receiving 1 unit PRBC's UOP adequate  Plan: Continue current plan  Purcell Nailslarence H Owen, MD 09/10/2017 7:35 PM

## 2017-09-10 NOTE — Progress Notes (Signed)
Patient ID: Leonard Padilla, male   DOB: 08-10-1945, 72 y.o.   MRN: [id number] EVENING ROUNDS NOTE :     [address]       [place] [id number]             [phone]                 1 Day Post-Op Procedure(s) (LRB): AORTIC VALVE REPLACEMENT (AVR) using a 23 Edwards Perimount Magna Ease Aortic Valve (N/A) CORONARY ARTERY BYPASS GRAFTING (CABG) x 4 using the left internal mammary artery and right greater saphenous vein harvested endoscopically. (N/A) TRANSESOPHAGEAL ECHOCARDIOGRAM (TEE) (N/A)  Total Length of Stay:  LOS: 1 day  BP 106/81 (BP Location: Right Arm)   Pulse 75   Temp 98.2 F (36.8 C) (Oral)   Resp (!) 21   Ht 5' 8.5" (1.74 m)   Wt 192 lb 0.3 oz (87.1 kg)   SpO2 98%   BMI 28.77 kg/m   .Intake/Output      02/04 0701 - 02/05 0700 02/05 0701 - 02/06 0700   P.O. 340    I.V. (mL/kg) 5180.8 (59.5) 446 (5.1)   Blood 500    IV Piggyback 2100    Total Intake(mL/kg) 8120.8 (93.2) 446 (5.1)   Urine (mL/kg/hr) 2281 (1.1) 200 (0.2)   Blood 600    Chest Tube 680 350   Total Output 3561 550   Net +4559.8 -104          . sodium chloride Stopped (09/10/17 0900)  . sodium chloride    . sodium chloride Stopped (09/10/17 0900)  . cefUROXime (ZINACEF)  IV Stopped (09/10/17 [id number])  . dexmedetomidine (PRECEDEX) IV infusion Stopped (09/10/17 0500)  . DOPamine 3.027 mcg/kg/min (09/10/17 1600)  . lactated ringers    . lactated ringers Stopped (09/09/17 1900)  . lactated ringers 20 mL/hr at 09/10/17 1600  . nitroGLYCERIN Stopped (09/09/17 1900)  . phenylephrine (NEO-SYNEPHRINE) Adult infusion 5 mcg/min (09/10/17 1719)     Lab Results  Component Value Date   WBC 12.5 (H) 09/10/2017   HGB 7.5 (L) 09/10/2017   HCT 22.0 (L) 09/10/2017   PLT 108 (L) 09/10/2017   GLUCOSE 135 (H) 09/10/2017   CHOL 190 04/24/2017   TRIG 140 04/24/2017   HDL 69 04/24/2017   LDLCALC 115 (H) 08/03/2016   ALT 25 09/04/2017   AST 24 09/04/2017   NA 137 09/10/2017   K 4.3  09/10/2017   CL 103 09/10/2017   CREATININE 1.10 09/10/2017   BUN 23 (H) 09/10/2017   CO2 20 (L) 09/10/2017   TSH 3.28 08/03/2016   PSA 0.7 04/24/2017   INR 1.60 09/09/2017   HGBA1C 5.5 09/04/2017   hgb down to 7.5 , weak when stands will transfuse one unit  Renal function remains stable     Delight OvensEdward B Lummie Montijo MD  Beeper [phone] Office [phone] 09/10/2017 6:33 PM

## 2017-09-10 NOTE — Progress Notes (Signed)
Hemoglobin 7.7 reported to [place], [place]

## 2017-09-10 NOTE — Progress Notes (Signed)
Patient ID: Leonard Padilla, male   DOB: Jan 17, 1946, 72 y.o.   MRN: [id number] [facility] DAILY ICU PROGRESS NOTE                   [address]            [place] [address]          [phone]   1 Day Post-Op Procedure(s) (LRB): AORTIC VALVE REPLACEMENT (AVR) using a 23 Edwards Perimount Magna Ease Aortic Valve (N/A) CORONARY ARTERY BYPASS GRAFTING (CABG) x 4 using the left internal mammary artery and right greater saphenous vein harvested endoscopically. (N/A) TRANSESOPHAGEAL ECHOCARDIOGRAM (TEE) (N/A)  Total Length of Stay:  LOS: 1 day   Subjective: Patient awake alert neurologically intact this a.m., extubated last night  Objective: Vital signs in last 24 hours: Temp:  [95.7 F (35.4 C)-99.3 F (37.4 C)] 99 F (37.2 C) (02/05 0700) Pulse Rate:  [68-99] 79 (02/05 0700) Cardiac Rhythm: Normal sinus rhythm (02/05 0400) Resp:  [10-28] 27 (02/05 0700) BP: (75-142)/(50-77) 112/65 (02/05 0700) SpO2:  [90 %-100 %] 97 % (02/05 0700) Arterial Line BP: (93-149)/(24-62) 138/52 (02/05 0700) FiO2 (%):  [40 %-50 %] 40 % (02/04 2020) Weight:  [182 lb 9.6 oz (82.8 kg)-192 lb 0.3 oz (87.1 kg)] 192 lb 0.3 oz (87.1 kg) (02/05 0400)  Filed Weights   09/09/17 2245 09/10/17 0400  Weight: 182 lb 9.6 oz (82.8 kg) 192 lb 0.3 oz (87.1 kg)    Weight change:    Hemodynamic parameters for last 24 hours: PAP: (14-49)/(3-17) 38/16 CO:  [3.9 L/min-5.5 L/min] 4.8 L/min CI:  [2.3 L/min/m2-2.8 L/min/m2] 2.4 L/min/m2  Intake/Output from previous day: 02/04 0701 - 02/05 0700 In: 8120.8 [P.O.:340; I.V.:5180.8; Blood:500; IV Piggyback:2100] Out: 3561 [Urine:2281; Blood:600; Chest Tube:680]  Intake/Output this shift: No intake/output data recorded.  Current Meds: Scheduled Meds: . acetaminophen  1,000 mg Oral Q6H   Or  . acetaminophen (TYLENOL) oral liquid 160 mg/5 mL  1,000 mg Per Tube Q6H  . aspirin EC  325 mg Oral Daily   Or  . aspirin  324 mg Per Tube Daily  . atorvastatin  40  mg Oral q1800  . bisacodyl  10 mg Oral Daily   Or  . bisacodyl  10 mg Rectal Daily  . docusate sodium  200 mg Oral Daily  . insulin regular  0-10 Units Intravenous TID WC  . mouth rinse  15 mL Mouth Rinse BID  . metoprolol tartrate  12.5 mg Oral BID   Or  . metoprolol tartrate  12.5 mg Per Tube BID  . [START ON 09/11/2017] pantoprazole  40 mg Oral Daily  . sertraline  25 mg Oral Daily  . sodium chloride flush  3 mL Intravenous Q12H  . tamsulosin  0.4 mg Oral Daily   Continuous Infusions: . sodium chloride 20 mL/hr at 09/10/17 0500  . sodium chloride    . sodium chloride 20 mL/hr at 09/10/17 0500  . cefUROXime (ZINACEF)  IV Stopped (09/09/17 2030)  . dexmedetomidine (PRECEDEX) IV infusion Stopped (09/10/17 0500)  . DOPamine 3.027 mcg/kg/min (09/10/17 0500)  . insulin (NOVOLIN-R) infusion 1.7 Units/hr (09/10/17 0700)  . lactated ringers    . lactated ringers Stopped (09/09/17 1900)  . lactated ringers 20 mL/hr at 09/10/17 0500  . nitroGLYCERIN Stopped (09/09/17 1900)  . phenylephrine (NEO-SYNEPHRINE) Adult infusion 60 mcg/min (09/10/17 0500)   PRN Meds:.sodium chloride, lactated ringers, metoprolol tartrate, morphine injection, ondansetron (ZOFRAN) IV, oxyCODONE, sodium chloride flush, traMADol  General appearance: alert, cooperative and no distress Neurologic: intact Heart: regular rate and rhythm, S1, S2 normal, no murmur, click, rub or gallop Lungs: diminished breath sounds bibasilar Abdomen: soft, non-tender; bowel sounds normal; no masses,  no organomegaly Extremities: extremities normal, atraumatic, no cyanosis or edema and Homans sign is negative, no sign of DVT Wound: Dressing intact sternum stable  Lab Results: CBC: Recent Labs    09/09/17 2044 09/09/17 2048 09/10/17 0308  WBC 8.9  --  12.3*  HGB 8.2* 7.5* 8.5*  HCT 24.0* 22.0* 25.1*  PLT 96*  --  126*   BMET:  Recent Labs    09/09/17 2048 09/10/17 0308  NA 140 137  K 4.2 4.3  CL 104 108  CO2  --  20*    GLUCOSE 139* 115*  BUN 19 20  CREATININE 1.10 1.29*  CALCIUM  --  8.3*    CMET: Lab Results  Component Value Date   WBC 12.3 (H) 09/10/2017   HGB 8.5 (L) 09/10/2017   HCT 25.1 (L) 09/10/2017   PLT 126 (L) 09/10/2017   GLUCOSE 115 (H) 09/10/2017   CHOL 190 04/24/2017   TRIG 140 04/24/2017   HDL 69 04/24/2017   LDLCALC 115 (H) 08/03/2016   ALT 25 09/04/2017   AST 24 09/04/2017   NA 137 09/10/2017   K 4.3 09/10/2017   CL 108 09/10/2017   CREATININE 1.29 (H) 09/10/2017   BUN 20 09/10/2017   CO2 20 (L) 09/10/2017   TSH 3.28 08/03/2016   PSA 0.7 04/24/2017   INR 1.60 09/09/2017   HGBA1C 5.5 09/04/2017      PT/INR:  Recent Labs    09/09/17 1518  LABPROT 18.9*  INR 1.60   Radiology: Dg Chest Port 1 View  Result Date: 09/09/2017 CLINICAL DATA:  Status post coronary bypass grafting and aortic valve replacement EXAM: PORTABLE CHEST 1 VIEW COMPARISON:  09/04/2017 FINDINGS: Postsurgical changes are now seen. Swan-Ganz catheter is noted in the pulmonary outflow tract. Endotracheal tube, nasogastric catheter and mediastinal drain are noted in satisfactory position. Bilateral thoracostomy catheters are seen. No pneumothorax is noted. Very minimal basilar atelectasis is seen. No other focal abnormality is noted. IMPRESSION: Tubes and lines as described above.  Minimal basilar atelectasis. Electronically Signed   By: Alcide CleverMark  Lukens M.D.   On: 09/09/2017 15:37     Assessment/Plan: S/P Procedure(s) (LRB): AORTIC VALVE REPLACEMENT (AVR) using a 23 Edwards Perimount Magna Ease Aortic Valve (N/A) CORONARY ARTERY BYPASS GRAFTING (CABG) x 4 using the left internal mammary artery and right greater saphenous vein harvested endoscopically. (N/A) TRANSESOPHAGEAL ECHOCARDIOGRAM (TEE) (N/A) Mobilize Diuresis Diabetes control d/c tubes/lines See progression orders Expected Acute  Blood - loss Anemia- continue to monitor  Wean off dopamine as tolerated   Delight OvensEdward B Akhila Mahnken 09/10/2017 7:30  AM

## 2017-09-11 ENCOUNTER — Inpatient Hospital Stay (HOSPITAL_COMMUNITY): Payer: Medicare Other

## 2017-09-11 ENCOUNTER — Other Ambulatory Visit (HOSPITAL_BASED_OUTPATIENT_CLINIC_OR_DEPARTMENT_OTHER): Payer: Medicare Other

## 2017-09-11 LAB — GLUCOSE, CAPILLARY
Glucose-Capillary: 112 mg/dL — ABNORMAL HIGH (ref 65–99)
Glucose-Capillary: 128 mg/dL — ABNORMAL HIGH (ref 65–99)
Glucose-Capillary: 92 mg/dL (ref 65–99)
Glucose-Capillary: 114 mg/dL — ABNORMAL HIGH (ref 65–99)
Glucose-Capillary: 93 mg/dL (ref 65–99)
Glucose-Capillary: 105 mg/dL — ABNORMAL HIGH (ref 65–99)
Glucose-Capillary: 78 mg/dL (ref 65–99)

## 2017-09-11 LAB — BASIC METABOLIC PANEL
Anion gap: 11 (ref 5–15)
BUN: 25 mg/dL — ABNORMAL HIGH (ref 6–20)
CO2: 21 mmol/L — ABNORMAL LOW (ref 22–32)
Calcium: 8.8 mg/dL — ABNORMAL LOW (ref 8.9–10.3)
Chloride: 102 mmol/L (ref 101–111)
Creatinine, Ser: 1.29 mg/dL — ABNORMAL HIGH (ref 0.61–1.24)
GFR calc Af Amer: >60 mL/min (ref >60)
GFR calc non Af Amer: 53 mL/min — ABNORMAL LOW (ref >60)
Glucose, Bld: 138 mg/dL — ABNORMAL HIGH (ref 65–99)
Potassium: 3.7 mmol/L (ref 3.5–5.1)
Sodium: 134 mmol/L — ABNORMAL LOW (ref 135–145)

## 2017-09-11 LAB — CBC
HCT: 26.9 % — ABNORMAL LOW (ref 39.0–52.0)
Hemoglobin: 8.8 g/dL — ABNORMAL LOW (ref 13.0–17.0)
MCH: 30.2 pg (ref 26.0–34.0)
MCHC: 32.7 g/dL (ref 30.0–36.0)
MCV: 92.4 fL (ref 78.0–100.0)
Platelets: 89 10*3/uL — ABNORMAL LOW (ref 150–400)
RBC: 2.91 MIL/uL — ABNORMAL LOW (ref 4.22–5.81)
RDW: 13.2 % (ref 11.5–15.5)
WBC: 13.5 10*3/uL — ABNORMAL HIGH (ref 4.0–10.5)

## 2017-09-11 MED ORDER — ALBUMIN HUMAN 5 % IV SOLN
12.5000 g | Freq: Once | INTRAVENOUS | Status: AC
  Administered 2017-09-11: 12.5 g via INTRAVENOUS
  Filled 2017-09-11: qty 250

## 2017-09-11 MED ORDER — SODIUM CHLORIDE 0.9 % IV SOLN
1.0000 g | Freq: Once | INTRAVENOUS | Status: AC
  Administered 2017-09-11: 1 g via INTRAVENOUS
  Filled 2017-09-11: qty 10

## 2017-09-11 MED ORDER — FUROSEMIDE 10 MG/ML IJ SOLN
40.0000 mg | Freq: Once | INTRAMUSCULAR | Status: AC
  Administered 2017-09-11: 40 mg via INTRAVENOUS
  Filled 2017-09-11: qty 4

## 2017-09-11 MED ORDER — DOPAMINE-DEXTROSE 3.2-5 MG/ML-% IV SOLN
3.0000 ug/kg/min | INTRAVENOUS | Status: AC
  Administered 2017-09-11: 3 ug/kg/min via INTRAVENOUS
  Filled 2017-09-11: qty 250

## 2017-09-11 MED ORDER — POTASSIUM CHLORIDE CRYS ER 20 MEQ PO TBCR
20.0000 meq | EXTENDED_RELEASE_TABLET | ORAL | Status: AC
  Administered 2017-09-11 (×3): 20 meq via ORAL
  Filled 2017-09-11 (×3): qty 1

## 2017-09-11 NOTE — Progress Notes (Signed)
Pt BP low after near-syncopal event today, BP 80s/50s MAPs 60s. Pt is A&O, mentating well, just tired from events today. Remains on 3mcg dopamine. Spoke with Dr. Maren BeachVanTrigt, orders for albumin and calc gluc. Prefer not to restart Neo at this time. Will implement and cont. to monitor.

## 2017-09-11 NOTE — Progress Notes (Signed)
CT surgery p.m. Rounds  Resting comfortably Stable hemodynamics sinus rhythm Stable day

## 2017-09-11 NOTE — Progress Notes (Signed)
Pt assisted to edge of bed pt denies dizziness prior to standing. Pt stood up to avasys walker with minimal assist. Pt informed RN he was dizzy when he stood initially. Pt's knees buckeled and was assist x2 back to bed. sbp 86 and dopamine gtt increased. Dr Tyrone SageGerhardt paged to make aware. HR stable BP increasing. Wife at bedside.

## 2017-09-11 NOTE — Progress Notes (Signed)
I&O cath 600 cc of urine output pt tolerated well.

## 2017-09-11 NOTE — Progress Notes (Signed)
Patient ID: Leonard Padilla, male   DOB: 1946/01/17, 72 y.o.   MRN: [id number] TCTS DAILY ICU PROGRESS NOTE                   [address]            [place], [id number]          [phone]   2 Days Post-Op Procedure(s) (LRB): AORTIC VALVE REPLACEMENT (AVR) using a 23 Edwards Perimount Magna Ease Aortic Valve (N/A) CORONARY ARTERY BYPASS GRAFTING (CABG) x 4 using the left internal mammary artery and right greater saphenous vein harvested endoscopically. (N/A) TRANSESOPHAGEAL ECHOCARDIOGRAM (TEE) (N/A)  Total Length of Stay:  LOS: 2 days   Subjective: Patient awake alert neurologically intact sitting, walked briefly, overall feels better  Objective: Vital signs in last 24 hours: Temp:  [97.5 F (36.4 C)-99 F (37.2 C)] 97.8 F (36.6 C) (02/06 0330) Pulse Rate:  [70-90] 73 (02/06 0800) Cardiac Rhythm: Normal sinus rhythm (02/06 0400) Resp:  [14-31] 19 (02/06 0800) BP: (88-144)/(53-85) 90/57 (02/06 0800) SpO2:  [93 %-100 %] 100 % (02/06 0800) Arterial Line BP: (79-175)/(41-86) 119/54 (02/06 0300) Weight:  [191 lb 12.8 oz (87 kg)] 191 lb 12.8 oz (87 kg) (02/06 0500)  Filed Weights   09/09/17 2245 09/10/17 0400 09/11/17 0500  Weight: 182 lb 9.6 oz (82.8 kg) 192 lb 0.3 oz (87.1 kg) 191 lb 12.8 oz (87 kg)    Weight change: 9 lb 3.2 oz (4.173 kg)   Hemodynamic parameters for last 24 hours: PAP: (43)/(24) 43/24  Intake/Output from previous day: 02/05 0701 - 02/06 0700 In: 1341.2 [P.O.:120; I.V.:799.5; Blood:321.7; IV Piggyback:100] Out: 2110 [Urine:1470; Chest Tube:640]  Intake/Output this shift: Total I/O In: 72.6 [I.V.:22.6; IV Piggyback:50] Out: 25 [Urine:25]  Current Meds: Scheduled Meds: . acetaminophen  1,000 mg Oral Q6H   Or  . acetaminophen (TYLENOL) oral liquid 160 mg/5 mL  1,000 mg Per Tube Q6H  . aspirin EC  325 mg Oral Daily   Or  . aspirin  324 mg Per Tube Daily  . atorvastatin  40 mg Oral q1800  . bisacodyl  10 mg Oral Daily   Or  .  bisacodyl  10 mg Rectal Daily  . Chlorhexidine Gluconate Cloth  6 each Topical Daily  . docusate sodium  200 mg Oral Daily  . enoxaparin (LOVENOX) injection  30 mg Subcutaneous Q24H  . insulin aspart  0-24 Units Subcutaneous Q4H  . mouth rinse  15 mL Mouth Rinse BID  . metoprolol tartrate  12.5 mg Oral BID   Or  . metoprolol tartrate  12.5 mg Per Tube BID  . mupirocin ointment  1 application Nasal BID  . pantoprazole  40 mg Oral Daily  . potassium chloride  20 mEq Oral Q4H  . sertraline  25 mg Oral Daily  . sodium chloride flush  3 mL Intravenous Q12H  . tamsulosin  0.4 mg Oral Daily   Continuous Infusions: . sodium chloride Stopped (09/10/17 0900)  . sodium chloride    . sodium chloride Stopped (09/10/17 0900)  . dexmedetomidine (PRECEDEX) IV infusion Stopped (09/10/17 0500)  . lactated ringers    . lactated ringers Stopped (09/09/17 1900)  . lactated ringers 20 mL/hr at 09/11/17 0600  . nitroGLYCERIN Stopped (09/09/17 1900)  . phenylephrine (NEO-SYNEPHRINE) Adult infusion Stopped (09/10/17 2000)   PRN Meds:.sodium chloride, lactated ringers, metoprolol tartrate, morphine injection, ondansetron (ZOFRAN) IV, oxyCODONE, sodium chloride flush, traMADol  General appearance: alert, cooperative and  no distress Neurologic: intact Heart: regular rate and rhythm, S1, S2 normal, no murmur, click, rub or gallop Lungs: diminished breath sounds bibasilar Abdomen: soft, non-tender; bowel sounds normal; no masses,  no organomegaly Extremities: extremities normal, atraumatic, no cyanosis or edema and Homans sign is negative, no sign of DVT Wound: Sternum stable  Lab Results: CBC: Recent Labs    09/10/17 1604 09/10/17 1617 09/11/17 0321  WBC 12.5*  --  13.5*  HGB 7.7* 7.5* 8.8*  HCT 23.2* 22.0* 26.9*  PLT 108*  --  89*   BMET:  Recent Labs    09/10/17 0308  09/10/17 1617 09/11/17 0321  NA 137  --  137 134*  K 4.3  --  4.3 3.7  CL 108  --  103 102  CO2 20*  --   --  21*    GLUCOSE 115*  --  135* 138*  BUN 20  --  23* 25*  CREATININE 1.29*   < > 1.10 1.29*  CALCIUM 8.3*  --   --  8.8*   < > = values in this interval not displayed.    CMET: Lab Results  Component Value Date   WBC 13.5 (H) 09/11/2017   HGB 8.8 (L) 09/11/2017   HCT 26.9 (L) 09/11/2017   PLT 89 (L) 09/11/2017   GLUCOSE 138 (H) 09/11/2017   CHOL 190 04/24/2017   TRIG 140 04/24/2017   HDL 69 04/24/2017   LDLCALC 115 (H) 08/03/2016   ALT 25 09/04/2017   AST 24 09/04/2017   NA 134 (L) 09/11/2017   K 3.7 09/11/2017   CL 102 09/11/2017   CREATININE 1.29 (H) 09/11/2017   BUN 25 (H) 09/11/2017   CO2 21 (L) 09/11/2017   TSH 3.28 08/03/2016   PSA 0.7 04/24/2017   INR 1.60 09/09/2017   HGBA1C 5.5 09/04/2017      PT/INR:  Recent Labs    09/09/17 1518  LABPROT 18.9*  INR 1.60   Radiology: No results found.   Assessment/Plan: S/P Procedure(s) (LRB): AORTIC VALVE REPLACEMENT (AVR) using a 23 Edwards Perimount Magna Ease Aortic Valve (N/A) CORONARY ARTERY BYPASS GRAFTING (CABG) x 4 using the left internal mammary artery and right greater saphenous vein harvested endoscopically. (N/A) TRANSESOPHAGEAL ECHOCARDIOGRAM (TEE) (N/A) Mobilize Diuresis d/c tubes/lines DC Foley, patient has been on Flomax Creatinine stable Hemoglobin hematocrit improved after transfusion last night Expected Acute  Blood - loss Anemia- continue to monitor    Delight Ovens 09/11/2017 8:16 AM

## 2017-09-11 NOTE — Plan of Care (Signed)
  Progressing Education: Knowledge of General Education information will improve 09/11/2017 1615 - Progressing by Marcelino ScotWilson, Jarrick Fjeld G, RN Health Behavior/Discharge Planning: Ability to manage health-related needs will improve 09/11/2017 1615 - Progressing by Marcelino ScotWilson, Dago Jungwirth G, RN Clinical Measurements: Will remain free from infection 09/11/2017 1615 - Progressing by Marcelino ScotWilson, Zada Haser G, RN Diagnostic test results will improve 09/11/2017 1615 - Progressing by Marcelino ScotWilson, Breona Cherubin G, RN Respiratory complications will improve 09/11/2017 1615 - Progressing by Marcelino ScotWilson, Karey Suthers G, RN Cardiovascular complication will be avoided 09/11/2017 1615 - Progressing by Marcelino ScotWilson, Viveka Wilmeth G, RN Activity: Risk for activity intolerance will decrease 09/11/2017 1615 - Progressing by Marcelino ScotWilson, Axie Hayne G, RN Nutrition: Adequate nutrition will be maintained 09/11/2017 1615 - Progressing by Marcelino ScotWilson, Korey Arroyo G, RN Coping: Level of anxiety will decrease 09/11/2017 1615 - Progressing by Marcelino ScotWilson, Draeden Kellman G, RN Elimination: Will not experience complications related to bowel motility 09/11/2017 1615 - Progressing by Marcelino ScotWilson, Tanveer Dobberstein G, RN Pain Managment: General experience of comfort will improve 09/11/2017 1615 - Progressing by Marcelino ScotWilson, Ajax Schroll G, RN Skin Integrity: Risk for impaired skin integrity will decrease 09/11/2017 1615 - Progressing by Marcelino ScotWilson, Suzetta Timko G, RN Education: Ability to demonstrate proper wound care will improve 09/11/2017 1615 - Progressing by Marcelino ScotWilson, Nakari Bracknell G, RN Knowledge of disease or condition will improve 09/11/2017 1615 - Progressing by Marcelino ScotWilson, Xayden Linsey G, RN Knowledge of the prescribed therapeutic regimen will improve 09/11/2017 1615 - Progressing by Marcelino ScotWilson, Candas Deemer G, RN Activity: Risk for activity intolerance will decrease 09/11/2017 1615 - Progressing by Marcelino ScotWilson, Nikeria Kalman G, RN Cardiac: Hemodynamic stability will improve 09/11/2017 1615 - Progressing by Marcelino ScotWilson, Herminio Kniskern G, RN Clinical Measurements: Postoperative complications will be  avoided or minimized 09/11/2017 1615 - Progressing by Marcelino ScotWilson, Juancarlos Crescenzo G, RN Respiratory: Respiratory status will improve 09/11/2017 1615 - Progressing by Marcelino ScotWilson, Elye Harmsen G, RN Skin Integrity: Wound healing without signs and symptoms of infection 09/11/2017 1615 - Progressing by Marcelino ScotWilson, Cristianna Cyr G, RN Urinary Elimination: Ability to achieve and maintain adequate renal perfusion and functioning will improve 09/11/2017 1615 - Progressing by Marcelino ScotWilson, Sharica Roedel G, RN

## 2017-09-12 ENCOUNTER — Inpatient Hospital Stay (HOSPITAL_COMMUNITY): Payer: Medicare Other

## 2017-09-12 LAB — CBC
HCT: 24.7 % — ABNORMAL LOW (ref 39.0–52.0)
HCT: 28.1 % — ABNORMAL LOW (ref 39.0–52.0)
Hemoglobin: 7.9 g/dL — ABNORMAL LOW (ref 13.0–17.0)
Hemoglobin: 9.2 g/dL — ABNORMAL LOW (ref 13.0–17.0)
MCH: 30 pg (ref 26.0–34.0)
MCH: 30.8 pg (ref 26.0–34.0)
MCHC: 32 g/dL (ref 30.0–36.0)
MCHC: 32.7 g/dL (ref 30.0–36.0)
MCV: 93.9 fL (ref 78.0–100.0)
MCV: 94 fL (ref 78.0–100.0)
Platelets: 98 K/uL — ABNORMAL LOW (ref 150–400)
Platelets: 97 10*3/uL — ABNORMAL LOW (ref 150–400)
RBC: 2.63 MIL/uL — ABNORMAL LOW (ref 4.22–5.81)
RBC: 2.99 MIL/uL — ABNORMAL LOW (ref 4.22–5.81)
RDW: 13.5 % (ref 11.5–15.5)
RDW: 13.5 % (ref 11.5–15.5)
WBC: 10.8 K/uL — ABNORMAL HIGH (ref 4.0–10.5)
WBC: 11.4 10*3/uL — ABNORMAL HIGH (ref 4.0–10.5)

## 2017-09-12 LAB — GLUCOSE, CAPILLARY
Glucose-Capillary: 109 mg/dL — ABNORMAL HIGH (ref 65–99)
Glucose-Capillary: 110 mg/dL — ABNORMAL HIGH (ref 65–99)
Glucose-Capillary: 117 mg/dL — ABNORMAL HIGH (ref 65–99)
Glucose-Capillary: 59 mg/dL — ABNORMAL LOW (ref 65–99)
Glucose-Capillary: 63 mg/dL — ABNORMAL LOW (ref 65–99)
Glucose-Capillary: 96 mg/dL (ref 65–99)
Glucose-Capillary: 99 mg/dL (ref 65–99)

## 2017-09-12 LAB — PREPARE RBC (CROSSMATCH)

## 2017-09-12 LAB — BASIC METABOLIC PANEL
Anion gap: 10 (ref 5–15)
BUN: 30 mg/dL — ABNORMAL HIGH (ref 6–20)
CO2: 24 mmol/L (ref 22–32)
Calcium: 8.9 mg/dL (ref 8.9–10.3)
Chloride: 102 mmol/L (ref 101–111)
Creatinine, Ser: 1.34 mg/dL — ABNORMAL HIGH (ref 0.61–1.24)
GFR calc Af Amer: 59 mL/min — ABNORMAL LOW (ref >60)
GFR calc non Af Amer: 51 mL/min — ABNORMAL LOW (ref >60)
Glucose, Bld: 112 mg/dL — ABNORMAL HIGH (ref 65–99)
Potassium: 3.7 mmol/L (ref 3.5–5.1)
Sodium: 136 mmol/L (ref 135–145)

## 2017-09-12 MED ORDER — POTASSIUM CHLORIDE CRYS ER 20 MEQ PO TBCR
20.0000 meq | EXTENDED_RELEASE_TABLET | ORAL | Status: DC
  Administered 2017-09-12 (×2): 20 meq via ORAL
  Filled 2017-09-12 (×2): qty 1

## 2017-09-12 MED ORDER — SODIUM CHLORIDE 0.9 % IV SOLN
Freq: Once | INTRAVENOUS | Status: AC
  Administered 2017-09-12: 14:00:00 via INTRAVENOUS

## 2017-09-12 MED ORDER — POTASSIUM CHLORIDE 20 MEQ/15ML (10%) PO SOLN
20.0000 meq | ORAL | Status: AC
  Administered 2017-09-12 (×2): 20 meq via ORAL
  Filled 2017-09-12 (×2): qty 15

## 2017-09-12 MED ORDER — FUROSEMIDE 10 MG/ML IJ SOLN
40.0000 mg | Freq: Once | INTRAMUSCULAR | Status: AC
  Administered 2017-09-12: 40 mg via INTRAVENOUS
  Filled 2017-09-12: qty 4

## 2017-09-12 MED FILL — Sodium Chloride IV Soln 0.9%: INTRAVENOUS | Qty: 2000 | Status: AC

## 2017-09-12 MED FILL — Sodium Bicarbonate IV Soln 8.4%: INTRAVENOUS | Qty: 50 | Status: AC

## 2017-09-12 MED FILL — Electrolyte-R (PH 7.4) Solution: INTRAVENOUS | Qty: 3000 | Status: AC

## 2017-09-12 MED FILL — Heparin Sodium (Porcine) Inj 1000 Unit/ML: INTRAMUSCULAR | Qty: 10 | Status: AC

## 2017-09-12 MED FILL — Mannitol IV Soln 20%: INTRAVENOUS | Qty: 500 | Status: AC

## 2017-09-12 MED FILL — Lidocaine HCl IV Inj 20 MG/ML: INTRAVENOUS | Qty: 5 | Status: AC

## 2017-09-12 NOTE — Evaluation (Signed)
Physical Therapy Evaluation Patient Details Name: Leonard Padilla MRN: [id number] DOB: [birth year] Today's Date: 09/12/2017   History of Present Illness  Pt is a 72 y.o. male s/p AVR, CABGx4, and TEE on 09/09/17. PMH includes CVA (09/2015), HTN, CAD, CKD, arthritis, aortic stenosis, sleep apnea.    Clinical Impression  Pt presents with an overall decrease in functional mobility secondary to above. PTA, pt indep with mobility; will have family available for 24/7 assist. Educ on precautions, positioning, and importance of mobility. Today, pt able to amb 400' with single UE support and supervision for safety. Pt is moving very well with good ability to maintain sternal precautions. Pt would benefit from continued acute PT services to maximize functional mobility and independence prior to d/c home.    Follow Up Recommendations No PT follow up;Supervision - Intermittent    Equipment Recommendations  None recommended by PT    Recommendations for Other Services       Precautions / Restrictions Precautions Precautions: Sternal;Fall Restrictions Weight Bearing Restrictions: Yes(sternal precautions) Other Position/Activity Restrictions: Sternal      Mobility  Bed Mobility Overal bed mobility: Needs Assistance Bed Mobility: Rolling;Sidelying to Sit;Sit to Sidelying Rolling: Supervision Sidelying to sit: Supervision     Sit to sidelying: Supervision General bed mobility comments: Educ on log roll technique; pt able to perform well with intermittent cues. No physical assist required  Transfers Overall transfer level: Needs assistance Equipment used: None Transfers: Sit to/from Stand Sit to Stand: Supervision         General transfer comment: Good ability to stand with bilat hands on knees to maintain sternal precautions; supervision for safety  Ambulation/Gait Ambulation/Gait assistance: Supervision Ambulation Distance (Feet): 400 Feet Assistive device: 1 person hand held assist(IV  pole) Gait Pattern/deviations: Step-through pattern;Decreased stride length Gait velocity: Decreased Gait velocity interpretation: <1.8 ft/sec, indicative of risk for recurrent falls General Gait Details: Slow, controlled amb with single UE support pushing IV pole; supervision for safety. 1x standing rest break secondary to fatigue. HR up to 128 with ambulation  Stairs            Wheelchair Mobility    Modified Rankin (Stroke Patients Only)       Balance Overall balance assessment: Needs assistance   Sitting balance-Leahy Scale: Fair       Standing balance-Leahy Scale: Fair Standing balance comment: Can static stand with no UE support                             Pertinent Vitals/Pain Pain Assessment: No/denies pain    Home Living Family/patient expects to be discharged to:: Private residence Living Arrangements: Spouse/significant other Available Help at Discharge: Family;Available 24 hours/day Type of Home: House Home Access: Stairs to enter   [employer] of Steps: 1 threshold Home Layout: Two level;Able to live on main level with bedroom/bathroom Home Equipment: Dan Humphreys - 2 wheels;Shower seat Additional Comments: Wife and son will be available for 24/7 support. Pt can stay on main level, but prefers to get to master bedroom upstairs    Prior Function Level of Independence: Independent               Hand Dominance        Extremity/Trunk Assessment   Upper Extremity Assessment Upper Extremity Assessment: Overall WFL for tasks assessed(WFL within sternal precautions)    Lower Extremity Assessment Lower Extremity Assessment: Overall WFL for tasks assessed    Cervical / Trunk Assessment  Cervical / Trunk Assessment: Normal  Communication   Communication: No difficulties  Cognition Arousal/Alertness: Awake/alert Behavior During Therapy: WFL for tasks assessed/performed Overall Cognitive Status: Within Functional Limits for  tasks assessed                                        General Comments General comments (skin integrity, edema, etc.): Wife present throughout session and very supportive    Exercises     Assessment/Plan    PT Assessment Patient needs continued PT services  PT Problem List Decreased activity tolerance;Decreased balance;Decreased mobility       PT Treatment Interventions DME instruction;Gait training;Stair training;Functional mobility training;Therapeutic activities;Therapeutic exercise;Balance training;Patient/family education    PT Goals (Current goals can be found in the Care Plan section)  Acute Rehab PT Goals Patient Stated Goal: Return home; be able to climb stairs to master bedroom PT Goal Formulation: With patient Time For Goal Achievement: 09/26/17 Potential to Achieve Goals: Good    Frequency Min 3X/week   Barriers to discharge        Co-evaluation               AM-PAC PT "6 Clicks" Daily Activity  Outcome Measure Difficulty turning over in bed (including adjusting bedclothes, sheets and blankets)?: None Difficulty moving from lying on back to sitting on the side of the bed? : A Little Difficulty sitting down on and standing up from a chair with arms (e.g., wheelchair, bedside commode, etc,.)?: A Little Help needed moving to and from a bed to chair (including a wheelchair)?: A Little Help needed walking in hospital room?: A Little Help needed climbing 3-5 steps with a railing? : A Little 6 Click Score: 19    End of Session Equipment Utilized During Treatment: Gait belt Activity Tolerance: Patient tolerated treatment well Patient left: in bed;with call bell/phone within reach;with family/visitor present Nurse Communication: Mobility status PT Visit Diagnosis: Other abnormalities of gait and mobility (R26.89)    Time: [id number] PT Time Calculation (min) (ACUTE ONLY): 24 min   Charges:   PT Evaluation $PT Eval Moderate Complexity: 1  Mod PT Treatments $Gait Training: 8-22 mins   PT G Codes:       Ina HomesJaclyn Doni Widmer, PT, DPT Acute Rehab Services  Pager: [phone]  Malachy ChamberJaclyn L Azaylah Stailey 09/12/2017, 10:20 AM

## 2017-09-12 NOTE — Progress Notes (Signed)
Bedside bladder scanner preformed with Dr. Tyrone SageGerhardt at bedside. Scan yielded 162 mL. Patient states he feels fine with no complaints of pain or urination needs. Will continue to monitor.

## 2017-09-12 NOTE — Progress Notes (Signed)
Patient was being transferred to the bathroom with Vennie HomansSweta and Marisue IvanLiz NT techs and patient become dizzy and they lowered him to the ground and called for help. The patient did not fall. They safely lowered to the ground until help came to place the patient in the chair. Patient did not become bradycardic or hypotensive per the monitor. Placed patient safely in chair and continued to monitor. Called Dr. Tyrone SageGerhardt and informed him of patient condition and in and out cath results. He ordered to place a foley and give patient 1 unit of PRBCs. Patient remains on monitor and alert and oriented X 4. Vital signs stable and will continue to monitor.

## 2017-09-12 NOTE — Progress Notes (Signed)
Patient ID: Leonard Padilla, male   DOB: [birth year], 72 y.o.   MRN: [id number] TCTS DAILY ICU PROGRESS NOTE                   [address]            [place] [address]          [phone]   3 Days Post-Op Procedure(s) (LRB): AORTIC VALVE REPLACEMENT (AVR) using a 23 Edwards Perimount Magna Ease Aortic Valve (N/A) CORONARY ARTERY BYPASS GRAFTING (CABG) x 4 using the left internal mammary artery and right greater saphenous vein harvested endoscopically. (N/A) TRANSESOPHAGEAL ECHOCARDIOGRAM (TEE) (N/A)  Total Length of Stay:  LOS: 3 days   Subjective: Patient awake alert neurologically intact, notes that he walked partway around last night, is not been able to void Done twice since yesterday Objective: Vital signs in last 24 hours: Temp:  [98.1 F (36.7 C)-98.8 F (37.1 C)] 98.8 F (37.1 C) (02/07 0731) Pulse Rate:  [71-100] 82 (02/07 0700) Cardiac Rhythm: Normal sinus rhythm (02/07 0400) Resp:  [11-29] 11 (02/07 0700) BP: (79-167)/(50-85) 91/58 (02/07 0700) SpO2:  [89 %-100 %] 98 % (02/07 0700) Weight:  [191 lb 2.2 oz (86.7 kg)] 191 lb 2.2 oz (86.7 kg) (02/07 0530)  Filed Weights   09/10/17 0400 09/11/17 0500 09/12/17 0530  Weight: 192 lb 0.3 oz (87.1 kg) 191 lb 12.8 oz (87 kg) 191 lb 2.2 oz (86.7 kg)    Weight change: -10.6 oz (-0.3 kg)   Hemodynamic parameters for last 24 hours:    Intake/Output from previous day: 02/06 0701 - 02/07 0700 In: 988.2 [I.V.:578.2; IV Piggyback:410] Out: 1405 [Urine:1405]  Intake/Output this shift: No intake/output data recorded.  Current Meds: Scheduled Meds: . acetaminophen  1,000 mg Oral Q6H   Or  . acetaminophen (TYLENOL) oral liquid 160 mg/5 mL  1,000 mg Per Tube Q6H  . aspirin EC  325 mg Oral Daily   Or  . aspirin  324 mg Per Tube Daily  . atorvastatin  40 mg Oral q1800  . bisacodyl  10 mg Oral Daily   Or  . bisacodyl  10 mg Rectal Daily  . Chlorhexidine Gluconate Cloth  6 each Topical Daily  . docusate  sodium  200 mg Oral Daily  . enoxaparin (LOVENOX) injection  30 mg Subcutaneous Q24H  . insulin aspart  0-24 Units Subcutaneous Q4H  . mouth rinse  15 mL Mouth Rinse BID  . metoprolol tartrate  12.5 mg Oral BID   Or  . metoprolol tartrate  12.5 mg Per Tube BID  . mupirocin ointment  1 application Nasal BID  . pantoprazole  40 mg Oral Daily  . potassium chloride  20 mEq Oral Q4H  . sertraline  25 mg Oral Daily  . sodium chloride flush  3 mL Intravenous Q12H  . tamsulosin  0.4 mg Oral Daily   Continuous Infusions: . sodium chloride Stopped (09/10/17 0900)  . sodium chloride    . sodium chloride Stopped (09/10/17 0900)  . dexmedetomidine (PRECEDEX) IV infusion Stopped (09/10/17 0500)  . DOPamine 3.027 mcg/kg/min (09/12/17 0700)  . lactated ringers    . lactated ringers Stopped (09/09/17 1900)  . lactated ringers 20 mL/hr at 09/12/17 0700  . nitroGLYCERIN Stopped (09/09/17 1900)  . phenylephrine (NEO-SYNEPHRINE) Adult infusion Stopped (09/10/17 2000)   PRN Meds:.sodium chloride, lactated ringers, metoprolol tartrate, morphine injection, ondansetron (ZOFRAN) IV, oxyCODONE, sodium chloride flush, traMADol  General appearance: alert, cooperative and no distress  Neurologic: intact Heart: regular rate and rhythm, S1, S2 normal, no murmur, click, rub or gallop Lungs: diminished breath sounds bibasilar Abdomen: soft, non-tender; bowel sounds normal; no masses,  no organomegaly Extremities: extremities normal, atraumatic, no cyanosis or edema and Homans sign is negative, no sign of DVT Wound: Sternum stable dressing intact  Lab Results: CBC: Recent Labs    09/11/17 0321 09/12/17 0337  WBC 13.5* 10.8*  HGB 8.8* 7.9*  HCT 26.9* 24.7*  PLT 89* 98*   BMET:  Recent Labs    09/11/17 0321 09/12/17 0337  NA 134* 136  K 3.7 3.7  CL 102 102  CO2 21* 24  GLUCOSE 138* 112*  BUN 25* 30*  CREATININE 1.29* 1.34*  CALCIUM 8.8* 8.9    CMET: Lab Results  Component Value Date   WBC  10.8 (H) 09/12/2017   HGB 7.9 (L) 09/12/2017   HCT 24.7 (L) 09/12/2017   PLT 98 (L) 09/12/2017   GLUCOSE 112 (H) 09/12/2017   CHOL 190 04/24/2017   TRIG 140 04/24/2017   HDL 69 04/24/2017   LDLCALC 115 (H) 08/03/2016   ALT 25 09/04/2017   AST 24 09/04/2017   NA 136 09/12/2017   K 3.7 09/12/2017   CL 102 09/12/2017   CREATININE 1.34 (H) 09/12/2017   BUN 30 (H) 09/12/2017   CO2 24 09/12/2017   TSH 3.28 08/03/2016   PSA 0.7 04/24/2017   INR 1.60 09/09/2017   HGBA1C 5.5 09/04/2017      PT/INR:  Recent Labs    09/09/17 1518  LABPROT 18.9*  INR 1.60   Radiology: No results found.   Assessment/Plan: S/P Procedure(s) (LRB): AORTIC VALVE REPLACEMENT (AVR) using a 23 Edwards Perimount Magna Ease Aortic Valve (N/A) CORONARY ARTERY BYPASS GRAFTING (CABG) x 4 using the left internal mammary artery and right greater saphenous vein harvested endoscopically. (N/A) TRANSESOPHAGEAL ECHOCARDIOGRAM (TEE) (N/A) Mobilize Diuresis Plan for transfer to step-down when urinary voiding is stable I/o cath x2 since yesterday  Has been on Flomax postop , before cath was removed May require Foley and urology consultation Mild thrombocytopenia Creatinine has remained stable at 1.3 Wean off dopamine  Leonard Padilla 09/12/2017 7:59 AM

## 2017-09-12 NOTE — Care Management Note (Signed)
Case Management Note  Patient Details  Name: Leonard Padilla MRN: [id number] Date of Birth: Jan 31, 1946  Subjective/Objective:   From home with spouse, pta indep, POD 3 AVR, CABG, weaned off dopamine.                   Action/Plan: NCM will follow for transition of care needs.   Expected Discharge Date:  09/16/17               Expected Discharge Plan:     In-House Referral:     Discharge planning Services  CM Consult  Post Acute Care Choice:    Choice offered to:     DME Arranged:    DME Agency:     HH Arranged:    HH Agency:     Status of Service:  In process, will continue to follow  If discussed at Long Length of Stay Meetings, dates discussed:    Additional Comments:  Leone Havenaylor, Gearline Spilman [place], RN 09/12/2017, 3:24 PM

## 2017-09-12 NOTE — Progress Notes (Signed)
0400 CBG inaccurately low due to dopamine gtt. Rechecked blood sugar via venous blood: BS 109.

## 2017-09-12 NOTE — Progress Notes (Addendum)
Patient needed to walk to the bathroom to attempt to have a bowel movement. During transfer patient become very lightheaded and began to bend at the knees. Patient was able to sit on the toilet and feel better. Only able to pass gas. Stayed with patient and recovered quickly. Vital signs were stable after transfer. Patient returned to bed with no distress noted. Patient did not fall during transfer only felt dizzy. Will continue to monitor. Family at bedside. This occurred at 1030.

## 2017-09-12 NOTE — Progress Notes (Signed)
Follow up bladder scan showed greater than 545 mL in bladder. Per protocol preformed In and Out Cath using sterile technique and patient voided 725 mL. Tolerated well. Vital signs stable, will continue to monitor.

## 2017-09-13 ENCOUNTER — Encounter: Payer: Self-pay | Admitting: Physician Assistant

## 2017-09-13 DIAGNOSIS — R338 Other retention of urine: Secondary | ICD-10-CM

## 2017-09-13 DIAGNOSIS — N401 Enlarged prostate with lower urinary tract symptoms: Secondary | ICD-10-CM

## 2017-09-13 HISTORY — DX: Benign prostatic hyperplasia with lower urinary tract symptoms: N40.1

## 2017-09-13 LAB — CBC
HCT: 26.1 % — ABNORMAL LOW (ref 39.0–52.0)
Hemoglobin: 8.7 g/dL — ABNORMAL LOW (ref 13.0–17.0)
MCH: 31.3 pg (ref 26.0–34.0)
MCHC: 33.3 g/dL (ref 30.0–36.0)
MCV: 93.9 fL (ref 78.0–100.0)
Platelets: 116 10*3/uL — ABNORMAL LOW (ref 150–400)
RBC: 2.78 MIL/uL — ABNORMAL LOW (ref 4.22–5.81)
RDW: 13.6 % (ref 11.5–15.5)
WBC: 10.4 10*3/uL (ref 4.0–10.5)

## 2017-09-13 LAB — GLUCOSE, CAPILLARY
Glucose-Capillary: 112 mg/dL — ABNORMAL HIGH (ref 65–99)
Glucose-Capillary: 75 mg/dL (ref 65–99)
Glucose-Capillary: 79 mg/dL (ref 65–99)
Glucose-Capillary: 81 mg/dL (ref 65–99)
Glucose-Capillary: 81 mg/dL (ref 65–99)
Glucose-Capillary: 86 mg/dL (ref 65–99)
Glucose-Capillary: 88 mg/dL (ref 65–99)

## 2017-09-13 LAB — BASIC METABOLIC PANEL
Anion gap: 12 (ref 5–15)
BUN: 37 mg/dL — ABNORMAL HIGH (ref 6–20)
CO2: 20 mmol/L — ABNORMAL LOW (ref 22–32)
Calcium: 8.5 mg/dL — ABNORMAL LOW (ref 8.9–10.3)
Chloride: 103 mmol/L (ref 101–111)
Creatinine, Ser: 1.51 mg/dL — ABNORMAL HIGH (ref 0.61–1.24)
GFR calc Af Amer: 51 mL/min — ABNORMAL LOW (ref >60)
GFR calc non Af Amer: 44 mL/min — ABNORMAL LOW (ref >60)
Glucose, Bld: 96 mg/dL (ref 65–99)
Potassium: 4 mmol/L (ref 3.5–5.1)
Sodium: 135 mmol/L (ref 135–145)

## 2017-09-13 MED ORDER — CHLORHEXIDINE GLUCONATE CLOTH 2 % EX PADS
6.0000 | MEDICATED_PAD | Freq: Every day | CUTANEOUS | Status: DC
  Administered 2017-09-14 – 2017-09-19 (×6): 6 via TOPICAL

## 2017-09-13 MED ORDER — SODIUM CHLORIDE 0.9 % IV SOLN
0.0000 ug/min | INTRAVENOUS | Status: DC
  Administered 2017-09-13: 50 ug/min via INTRAVENOUS
  Administered 2017-09-14: 20 ug/min via INTRAVENOUS
  Filled 2017-09-13 (×2): qty 40

## 2017-09-13 MED ORDER — SODIUM CHLORIDE 0.9% FLUSH
10.0000 mL | INTRAVENOUS | Status: DC | PRN
  Administered 2017-09-18: 10 mL
  Filled 2017-09-13: qty 40

## 2017-09-13 MED ORDER — SODIUM CHLORIDE 0.9% FLUSH
10.0000 mL | Freq: Two times a day (BID) | INTRAVENOUS | Status: DC
  Administered 2017-09-14 – 2017-09-19 (×9): 10 mL

## 2017-09-13 NOTE — Progress Notes (Addendum)
Patient ID: Leonard Padilla, male   DOB: 1945-10-23, 72 y.o.   MRN: [id number] TCTS DAILY ICU PROGRESS NOTE                   [address]            [place] [id number]          [phone]   4 Days Post-Op Procedure(s) (LRB): AORTIC VALVE REPLACEMENT (AVR) using a 23 Edwards Perimount Magna Ease Aortic Valve (N/A) CORONARY ARTERY BYPASS GRAFTING (CABG) x 4 using the left internal mammary artery and right greater saphenous vein harvested endoscopically. (N/A) TRANSESOPHAGEAL ECHOCARDIOGRAM (TEE) (N/A)  Total Length of Stay:  LOS: 4 days   Subjective: Feels good this morning.   Objective: Vital signs in last 24 hours: Temp:  [97.8 F (36.6 C)-98.7 F (37.1 C)] 98.6 F (37 C) (02/08 0737) Pulse Rate:  [66-120] 66 (02/08 0700) Cardiac Rhythm: Normal sinus rhythm (02/08 0400) Resp:  [10-23] 12 (02/08 0700) BP: (76-120)/(43-98) 101/59 (02/08 0700) SpO2:  [90 %-100 %] 100 % (02/08 0700) Weight:  [192 lb (87.1 kg)] 192 lb (87.1 kg) (02/08 0500)  Filed Weights   09/11/17 0500 09/12/17 0530 09/13/17 0500  Weight: 191 lb 12.8 oz (87 kg) 191 lb 2.2 oz (86.7 kg) 192 lb (87.1 kg)    Weight change: 13.8 oz (0.391 kg)      Intake/Output from previous day: 02/07 0701 - 02/08 0700 In: 772.5 [I.V.:427.5; Blood:345] Out: 1600 [Urine:1300; Stool:300]  Intake/Output this shift: No intake/output data recorded.  Current Meds: Scheduled Meds: . acetaminophen  1,000 mg Oral Q6H   Or  . acetaminophen (TYLENOL) oral liquid 160 mg/5 mL  1,000 mg Per Tube Q6H  . aspirin EC  325 mg Oral Daily   Or  . aspirin  324 mg Per Tube Daily  . atorvastatin  40 mg Oral q1800  . bisacodyl  10 mg Oral Daily   Or  . bisacodyl  10 mg Rectal Daily  . Chlorhexidine Gluconate Cloth  6 each Topical Daily  . docusate sodium  200 mg Oral Daily  . enoxaparin (LOVENOX) injection  30 mg Subcutaneous Q24H  . insulin aspart  0-24 Units Subcutaneous Q4H  . mouth rinse  15 mL Mouth Rinse BID  .  metoprolol tartrate  12.5 mg Oral BID   Or  . metoprolol tartrate  12.5 mg Per Tube BID  . mupirocin ointment  1 application Nasal BID  . pantoprazole  40 mg Oral Daily  . sertraline  25 mg Oral Daily  . sodium chloride flush  3 mL Intravenous Q12H  . tamsulosin  0.4 mg Oral Daily   Continuous Infusions: . sodium chloride Stopped (09/10/17 0900)  . sodium chloride    . sodium chloride Stopped (09/10/17 0900)  . dexmedetomidine (PRECEDEX) IV infusion Stopped (09/10/17 0500)  . lactated ringers    . lactated ringers Stopped (09/09/17 1900)  . lactated ringers 20 mL/hr at 09/13/17 0700  . nitroGLYCERIN Stopped (09/09/17 1900)  . phenylephrine (NEO-SYNEPHRINE) Adult infusion     PRN Meds:.sodium chloride, lactated ringers, metoprolol tartrate, morphine injection, ondansetron (ZOFRAN) IV, oxyCODONE, sodium chloride flush, traMADol  General appearance: alert, cooperative and no distress Heart: regular rate and rhythm, S1, S2 normal, no murmur, click, rub or gallop Lungs: clear to auscultation bilaterally Abdomen: soft, non-tender; bowel sounds normal; no masses,  no organomegaly Extremities: extremities normal, atraumatic, no cyanosis or edema Wound: clean and dry  Lab Results: CBC:  Recent Labs    09/12/17 1813 09/13/17 0425  WBC 11.4* 10.4  HGB 9.2* 8.7*  HCT 28.1* 26.1*  PLT 97* 116*   BMET:  Recent Labs    09/12/17 0337 09/13/17 0425  NA 136 135  K 3.7 4.0  CL 102 103  CO2 24 20*  GLUCOSE 112* 96  BUN 30* 37*  CREATININE 1.34* 1.51*  CALCIUM 8.9 8.5*    CMET: Lab Results  Component Value Date   WBC 10.4 09/13/2017   HGB 8.7 (L) 09/13/2017   HCT 26.1 (L) 09/13/2017   PLT 116 (L) 09/13/2017   GLUCOSE 96 09/13/2017   CHOL 190 04/24/2017   TRIG 140 04/24/2017   HDL 69 04/24/2017   LDLCALC 115 (H) 08/03/2016   ALT 25 09/04/2017   AST 24 09/04/2017   NA 135 09/13/2017   K 4.0 09/13/2017   CL 103 09/13/2017   CREATININE 1.51 (H) 09/13/2017   BUN 37 (H)  09/13/2017   CO2 20 (L) 09/13/2017   TSH 3.28 08/03/2016   PSA 0.7 04/24/2017   INR 1.60 09/09/2017   HGBA1C 5.5 09/04/2017      PT/INR: No results for input(s): LABPROT, INR in the last 72 hours. Radiology: No results found.   Assessment/Plan: S/P Procedure(s) (LRB): AORTIC VALVE REPLACEMENT (AVR) using a 23 Edwards Perimount Magna Ease Aortic Valve (N/A) CORONARY ARTERY BYPASS GRAFTING (CABG) x 4 using the left internal mammary artery and right greater saphenous vein harvested endoscopically. (N/A) TRANSESOPHAGEAL ECHOCARDIOGRAM (TEE) (N/A)  1. CV-NSR in the 60s. BP well controlled. On Neo gtt. ASA and statin. 2. Pulm-tolerating 2L Weld with great oxygenation. No CXR today Wean as tolerated. Encouraged incentive spirometer use. 3. Renal-creatinine 1.51. Off dopamine. On flomax and making good urine. Foley catheter was replaced after two i/o catheters yesterday. Urology consulted 4. H and H stable. 8.7/26.1, platelets trending up 5. Endo-blood glucose level well controlled on current regimen.    Jari Favre, PA-C  Foley replaced due to need for io cath x 3 Has been on Flomax preop, strated back postop but still with urinary retention  Back on neo for low bp   Delight Ovens 09/13/2017 7:40 AM

## 2017-09-13 NOTE — Progress Notes (Signed)
Patient ID: Leonard Padilla, male   DOB: [birth year], 72 y.o.   MRN: [id number] EVENING ROUNDS NOTE :     [address]       [place] [id number]             [phone]                 4 Days Post-Op Procedure(s) (LRB): AORTIC VALVE REPLACEMENT (AVR) using a 23 Edwards Perimount Magna Ease Aortic Valve (N/A) CORONARY ARTERY BYPASS GRAFTING (CABG) x 4 using the left internal mammary artery and right greater saphenous vein harvested endoscopically. (N/A) TRANSESOPHAGEAL ECHOCARDIOGRAM (TEE) (N/A)  Total Length of Stay:  LOS: 4 days  BP (!) 95/58 (BP Location: Left Arm)   Pulse 71   Temp 98.3 F (36.8 C) (Oral)   Resp 15   Ht 5' 8.5" (1.74 m)   Wt 192 lb (87.1 kg)   SpO2 97%   BMI 28.77 kg/m   .Intake/Output      02/08 0701 - 02/09 0700   I.V. (mL/kg) 80 (0.9)   Blood    Total Intake(mL/kg) 80 (0.9)   Urine (mL/kg/hr) 270 (0.2)   Stool    Total Output 270   Net -190         . sodium chloride Stopped (09/10/17 0900)  . sodium chloride    . sodium chloride Stopped (09/10/17 0900)  . dexmedetomidine (PRECEDEX) IV infusion Stopped (09/10/17 0500)  . lactated ringers    . lactated ringers Stopped (09/09/17 1900)  . lactated ringers 20 mL/hr at 09/13/17 1100  . nitroGLYCERIN Stopped (09/09/17 1900)  . phenylephrine (NEO-SYNEPHRINE) Adult infusion 40 mcg/min (09/13/17 1240)     Lab Results  Component Value Date   WBC 10.4 09/13/2017   HGB 8.7 (L) 09/13/2017   HCT 26.1 (L) 09/13/2017   PLT 116 (L) 09/13/2017   GLUCOSE 96 09/13/2017   CHOL 190 04/24/2017   TRIG 140 04/24/2017   HDL 69 04/24/2017   LDLCALC 115 (H) 08/03/2016   ALT 25 09/04/2017   AST 24 09/04/2017   NA 135 09/13/2017   K 4.0 09/13/2017   CL 103 09/13/2017   CREATININE 1.51 (H) 09/13/2017   BUN 37 (H) 09/13/2017   CO2 20 (L) 09/13/2017   TSH 3.28 08/03/2016   PSA 0.7 04/24/2017   INR 1.60 09/09/2017   HGBA1C 5.5 09/04/2017   Walking better, around until Leave foley for now     Delight Ovensdward B Lennon Boutwell MD  Beeper [phone] Office [phone] 09/13/2017 8:57 PM

## 2017-09-13 NOTE — Plan of Care (Signed)
RN and pt. Discussed the guidelines regarding ROM in upper body. Pt verbalized an understanding of sternal precautions and RN made an emphasis to incorporate updated guidelines.

## 2017-09-13 NOTE — Consult Note (Signed)
Urology Consult  CC: Referring physician: Sheliah PlaneEdward Gerhardt, MD Reason for referral: Urinary retention  Impression/Assessment: BPH with acute urinary retention: He has failed several voiding trials and has known BPH with outlet obstruction.  I suspect that given time and once he is out of the hospital and back on his tamsulosin he will likely void spontaneously.  For now he question whether the tamsulosin was necessary and I noted his blood pressure was somewhat low so if it was felt he would be better served to stop his tamsulosin that would be fine.  I offered to him 2 options the first would be to learn intermittent self-catheterization which would allow him to become catheter free.  He did not want to consider this and therefore I told him the other option would be to leave the Foley catheter and follow-up with Dr. Alvester MorinBell as an outpatient for a voiding trial.  What I recommended is that if his tamsulosin is stopped that he restart this 4-5 days prior to his follow-up office visit for a voiding trial at that time.  He and his wife have voiced their understanding and would like to proceed in that fashion.   Plan: 1.  Can stop tamsulosin for now if it is felt this would be beneficial in improving his blood pressure. 2.  He will be discharged with a Foley catheter indwelling. 3.  Follow-up as an outpatient for voiding trial.  The follow-up information has been placed on the chart.  With Foley catheter indwelling draining clear urine    History of Present Illness: [name] is a 72 year old male patient seen in hospital consultation today for further evaluation of urinary retention.  He underwent CABG and has been unable to void spontaneously during this hospitalization.  He has a history of BPH with bladder outlet obstruction and has been seen and treated by Dr. Alvester MorinBell.  He was last seen on 07/14/17 at which time his tamsulosin was increased from 0.4 mg to 0.8 mg.  He had reported noting about an 80%  improvement in his voiding pattern when he initially started taking tamsulosin. During his hospitalization, despite restarting tamsulosin, he has failed several voiding trials with in and out catheterization over the past couple of days with a new Foley catheter having been inserted today.  He reports that he is always had a "shy bladder" and that voiding on demand has been difficult for him but he said he typically always urinates when he has a bowel movement that has not occurred now.  He is tolerating his Foley catheter. His acute urinary retention would be considered of mild severity with no modifying factors or other associated signs and symptoms.  Past Medical History:  Diagnosis Date  . Aortic stenosis    moderate AS 12/01/15 echo (Dr. Elberta Fortisamnitz)  . Arthritis   . CKD (chronic kidney disease) stage 3, GFR 30-59 ml/min (HCC) 08/04/2016  . CKD stage G3a/A1, GFR 45-59 and albumin creatinine ratio <30 mg/g (HCC) 08/04/2016  . Coronary artery disease   . Dysthymia 03/18/2017  . 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 Essential hypertension 10/13/2015  . Flu 09/2015   influenza A  . Former smoker, stopped smoking in distant past   . GERD (gastroesophageal reflux disease)    occ  . Heart murmur   . History of colon polyps   . Hyperlipidemia   . 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 Pneumonia 09/2015  . Postural  dizziness with presyncope 08/27/2016  . Psoriasis   . Sleep apnea    to pick cpap tomorrow 12/13/15  . Stroke (HCC) 09/2015  . Symptomatic carotid artery stenosis 12/15/2015  . 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 Wears glasses    Past Surgical History:  Procedure Laterality Date  . AORTIC VALVE REPLACEMENT N/A 09/09/2017   Procedure: AORTIC VALVE REPLACEMENT (AVR) using a 23 Edwards Perimount Magna Ease Aortic Valve;  Surgeon: Delight Ovens, MD;  Location: [facility] OR;  Service: Open Heart Surgery;  Laterality: N/A;  . COLONOSCOPY W/  BIOPSIES AND POLYPECTOMY    . CORONARY ARTERY BYPASS GRAFT N/A 09/09/2017   Procedure: CORONARY ARTERY BYPASS GRAFTING (CABG) x 4 using the left internal mammary artery and right greater saphenous vein harvested endoscopically.;  Surgeon: Delight Ovens, MD;  Location: [facility] OR;  Service: Open Heart Surgery;  Laterality: N/A;  . ENDARTERECTOMY Right 12/15/2015   Procedure: RIGHT CAROTID ENDARTERECTOMY WITH PATCH ANGIOPLASTY;  Surgeon: Nada Libman, MD;  Location: [facility] OR;  Service: Vascular;  Laterality: Right;  . PATCH ANGIOPLASTY Right 12/15/2015   Procedure: RIGHT CAROTID PATCH ANGIOPLASTY;  Surgeon: Nada Libman, MD;  Location: [facility] OR;  Service: Vascular;  Laterality: Right;  . RIGHT/LEFT HEART CATH AND CORONARY ANGIOGRAPHY N/A 08/23/2017   Procedure: RIGHT/LEFT HEART CATH AND CORONARY ANGIOGRAPHY;  Surgeon: Swaziland, Peter M, MD;  Location: [facility] INVASIVE CV LAB;  Service: Cardiovascular;  Laterality: N/A;  . TEE WITHOUT CARDIOVERSION N/A 09/09/2017   Procedure: TRANSESOPHAGEAL ECHOCARDIOGRAM (TEE);  Surgeon: Delight Ovens, MD;  Location: [facility] OR;  Service: Open Heart Surgery;  Laterality: N/A;  . WISDOM TOOTH EXTRACTION      Medications:  Scheduled: . acetaminophen  1,000 mg Oral Q6H   Or  . acetaminophen (TYLENOL) oral liquid 160 mg/5 mL  1,000 mg Per Tube Q6H  . aspirin EC  325 mg Oral Daily   Or  . aspirin  324 mg Per Tube Daily  . atorvastatin  40 mg Oral q1800  . bisacodyl  10 mg Oral Daily   Or  . bisacodyl  10 mg Rectal Daily  . Chlorhexidine Gluconate Cloth  6 each Topical Daily  . Chlorhexidine Gluconate Cloth  6 each Topical Daily  . docusate sodium  200 mg Oral Daily  . enoxaparin (LOVENOX) injection  30 mg Subcutaneous Q24H  . insulin aspart  0-24 Units Subcutaneous Q4H  . mouth rinse  15 mL Mouth Rinse BID  . metoprolol tartrate  12.5 mg Oral BID   Or  . metoprolol tartrate  12.5 mg Per Tube BID  . mupirocin ointment  1 application Nasal BID  . pantoprazole  40 mg  Oral Daily  . sertraline  25 mg Oral Daily  . sodium chloride flush  10-40 mL Intracatheter Q12H  . sodium chloride flush  3 mL Intravenous Q12H  . tamsulosin  0.4 mg Oral Daily   Continuous: . sodium chloride Stopped (09/10/17 0900)  . sodium chloride    . sodium chloride Stopped (09/10/17 0900)  . dexmedetomidine (PRECEDEX) IV infusion Stopped (09/10/17 0500)  . lactated ringers    . lactated ringers Stopped (09/09/17 1900)  . lactated ringers 20 mL/hr at 09/13/17 1100  . nitroGLYCERIN Stopped (09/09/17 1900)  . phenylephrine (NEO-SYNEPHRINE) Adult infusion 40 mcg/min (09/13/17 1240)    Allergies:  Allergies  Allergen Reactions  . Viagra [Sildenafil Citrate] Other (See Comments)    COLOR DISCRIMINATION [DOSE RELATED] "blue sensation"    Family History  Problem Relation Age of Onset  . Cancer Mother        ovarian cancer  . Cancer Father        Prostate    Social History:  reports that he quit smoking about 34 years ago. he has never used smokeless tobacco. He reports that he drinks alcohol. He reports that he does not use drugs.  Review of Systems (10 point): Pertinent items are noted in HPI. A comprehensive review of systems was negative except as noted above.  Physical Exam:  Vital signs in last 24 hours: Temp:  [97.8 F (36.6 Padilla)-98.6 F (37 Padilla)] 98.6 F (37 Padilla) (02/08 0737) Pulse Rate:  [66-85] 71 (02/08 1500) Resp:  [11-25] 15 (02/08 1500) BP: (76-126)/(43-98) 95/58 (02/08 1500) SpO2:  [90 %-100 %] 97 % (02/08 1500) Weight:  [87.1 kg (192 lb)] 87.1 kg (192 lb) (02/08 0500) General appearance: alert and appears stated age Head: Normocephalic, without obvious abnormality, atraumatic Eyes: conjunctivae/corneas clear. EOM's intact.  Oropharynx: moist mucous membranes Neck: supple, symmetrical, trachea midline Resp: normal respiratory effort Cardio: regular rate and rhythm Back: symmetric, no curvature. ROM normal. No CVA tenderness. GI: soft, non-tender;  bowel sounds normal; no masses,  no organomegaly  Male genitalia: penis: normal male phallus with no lesions or discharge with Foley catheter indwelling draining clear urine.  Testes: bilaterally descended with no masses or tenderness. no hernias  Extremities: extremities normal, atraumatic, no cyanosis or edema Skin: Skin color normal. No visible rashes or lesions Neurologic: Grossly normal  Laboratory Data:  Recent Labs    09/12/17 0337 09/12/17 1813 09/13/17 0425  WBC 10.8* 11.4* 10.4  HGB 7.9* 9.2* 8.7*  HCT 24.7* 28.1* 26.1*   BMET Recent Labs    09/12/17 0337 09/13/17 0425  NA 136 135  K 3.7 4.0  CL 102 103  CO2 24 20*  GLUCOSE 112* 96  BUN 30* 37*  CREATININE 1.34* 1.51*  CALCIUM 8.9 8.5*   No results for input(s): LABPT, INR in the last 72 hours. No results for input(s): LABURIN in the last 72 hours. Results for orders placed or performed during the hospital encounter of 09/04/17  Surgical pcr screen     Status: Abnormal   Collection Time: 09/04/17  8:41 AM  Result Value Ref Range Status   MRSA, PCR NEGATIVE NEGATIVE Final   Staphylococcus aureus POSITIVE (A) NEGATIVE Final    Comment: (NOTE) The Xpert SA Assay (FDA approved for NASAL specimens in patients 64 years of age and older), is one component of a comprehensive surveillance program. It is not intended to diagnose infection nor to guide or monitor treatment.    Creatinine: Recent Labs    09/09/17 2048 09/10/17 0308 09/10/17 1604 09/10/17 1617 09/11/17 0321 09/12/17 0337 09/13/17 0425  CREATININE 1.10 1.29* 1.31* 1.10 1.29* 1.34* 1.51*    Imaging: Dg Chest Port 1 View  Result Date: 09/12/2017 CLINICAL DATA:  Recent chest tube rim EXAM: PORTABLE CHEST 1 VIEW COMPARISON:  09/11/2017 FINDINGS: Right jugular sheath is again noted. Postsurgical changes are again seen. Cardiac shadow is stable. Bilateral thoracostomy catheters have been removed in the interval. No pneumothorax is seen. Mild left  basilar atelectasis is noted. No other focal abnormality is seen. IMPRESSION: No recurrent pneumothorax following chest tube removal. Electronically Signed   By: Alcide Clever M.D.   On: 09/12/2017 09:11       Leonard Padilla 09/13/2017, 3:29 PM

## 2017-09-13 NOTE — Progress Notes (Signed)
Peripherally Inserted Central Catheter/Midline Placement  The IV Nurse has discussed with the patient and/or persons authorized to consent for the patient, the purpose of this procedure and the potential benefits and risks involved with this procedure.  The benefits include less needle sticks, lab draws from the catheter, and the patient may be discharged home with the catheter. Risks include, but not limited to, infection, bleeding, blood clot (thrombus formation), and puncture of an artery; nerve damage and irregular heartbeat and possibility to perform a PICC exchange if needed/ordered by physician.  Alternatives to this procedure were also discussed.  Bard Power PICC patient education guide, fact sheet on infection prevention and patient information card has been provided to patient /or left at bedside.    PICC/Midline Placement Documentation  PICC Double Lumen 09/13/17 PICC Right Brachial 43 cm 3 cm (Active)  Indication for Insertion or Continuance of Line Vasoactive infusions 09/13/2017 10:00 AM  Exposed Catheter (cm) 3 cm 09/13/2017 10:00 AM  Site Assessment Clean;Dry;Intact 09/13/2017 10:00 AM  Lumen #1 Status Flushed;Blood return noted 09/13/2017 10:00 AM  Lumen #2 Status Flushed;Blood return noted 09/13/2017 10:00 AM  Dressing Type Transparent 09/13/2017 10:00 AM  Dressing Status Clean;Dry;Intact;Antimicrobial disc in place 09/13/2017 10:00 AM  Dressing Change Due 09/20/17 09/13/2017 10:00 AM       Stacie GlazeJoyce, Trina Asch Horton 09/13/2017, 10:22 AM

## 2017-09-14 ENCOUNTER — Inpatient Hospital Stay (HOSPITAL_COMMUNITY): Payer: Medicare Other

## 2017-09-14 LAB — CBC
HCT: 26.4 % — ABNORMAL LOW (ref 39.0–52.0)
Hemoglobin: 8.8 g/dL — ABNORMAL LOW (ref 13.0–17.0)
MCH: 31.4 pg (ref 26.0–34.0)
MCHC: 33.3 g/dL (ref 30.0–36.0)
MCV: 94.3 fL (ref 78.0–100.0)
Platelets: 143 10*3/uL — ABNORMAL LOW (ref 150–400)
RBC: 2.8 MIL/uL — ABNORMAL LOW (ref 4.22–5.81)
RDW: 13.9 % (ref 11.5–15.5)
WBC: 7.8 10*3/uL (ref 4.0–10.5)

## 2017-09-14 LAB — BASIC METABOLIC PANEL
Anion gap: 11 (ref 5–15)
BUN: 32 mg/dL — ABNORMAL HIGH (ref 6–20)
CO2: 21 mmol/L — ABNORMAL LOW (ref 22–32)
Calcium: 8.4 mg/dL — ABNORMAL LOW (ref 8.9–10.3)
Chloride: 102 mmol/L (ref 101–111)
Creatinine, Ser: 1.28 mg/dL — ABNORMAL HIGH (ref 0.61–1.24)
GFR calc Af Amer: >60 mL/min (ref >60)
GFR calc non Af Amer: 54 mL/min — ABNORMAL LOW (ref >60)
Glucose, Bld: 151 mg/dL — ABNORMAL HIGH (ref 65–99)
Potassium: 3.6 mmol/L (ref 3.5–5.1)
Sodium: 134 mmol/L — ABNORMAL LOW (ref 135–145)

## 2017-09-14 LAB — GLUCOSE, CAPILLARY
Glucose-Capillary: 116 mg/dL — ABNORMAL HIGH (ref 65–99)
Glucose-Capillary: 108 mg/dL — ABNORMAL HIGH (ref 65–99)
Glucose-Capillary: 96 mg/dL (ref 65–99)
Glucose-Capillary: 93 mg/dL (ref 65–99)
Glucose-Capillary: 119 mg/dL — ABNORMAL HIGH (ref 65–99)
Glucose-Capillary: 109 mg/dL — ABNORMAL HIGH (ref 65–99)

## 2017-09-14 MED ORDER — POTASSIUM CHLORIDE 10 MEQ/50ML IV SOLN: 10.0000 meq | INTRAVENOUS | Status: DC

## 2017-09-14 MED ORDER — FE FUMARATE-B12-VIT C-FA-IFC PO CAPS
1.0000 | ORAL_CAPSULE | Freq: Two times a day (BID) | ORAL | Status: DC
  Administered 2017-09-15 – 2017-09-19 (×10): 1 via ORAL
  Filled 2017-09-14 (×12): qty 1

## 2017-09-14 MED ORDER — POTASSIUM CHLORIDE 10 MEQ/50ML IV SOLN
10.0000 meq | INTRAVENOUS | Status: AC
  Administered 2017-09-14 (×4): 10 meq via INTRAVENOUS
  Filled 2017-09-14 (×2): qty 50

## 2017-09-14 MED ORDER — POTASSIUM CHLORIDE 10 MEQ/50ML IV SOLN
INTRAVENOUS | Status: AC
  Administered 2017-09-14: 10 meq via INTRAVENOUS
  Filled 2017-09-14: qty 50

## 2017-09-14 MED ORDER — FUROSEMIDE 10 MG/ML IJ SOLN
20.0000 mg | Freq: Two times a day (BID) | INTRAMUSCULAR | Status: DC
  Administered 2017-09-14 – 2017-09-16 (×4): 20 mg via INTRAVENOUS
  Filled 2017-09-14 (×4): qty 2

## 2017-09-14 NOTE — Progress Notes (Signed)
Patient ID: Leonard Padilla M Schaible, male   DOB: [birth year], 72 y.o.   MRN: [id number] EVENING ROUNDS NOTE :     [address]       [place] [id number]             [phone]                 5 Days Post-Op Procedure(s) (LRB): AORTIC VALVE REPLACEMENT (AVR) using a 23 Edwards Perimount Magna Ease Aortic Valve (N/A) CORONARY ARTERY BYPASS GRAFTING (CABG) x 4 using the left internal mammary artery and right greater saphenous vein harvested endoscopically. (N/A) TRANSESOPHAGEAL ECHOCARDIOGRAM (TEE) (N/A)  Total Length of Stay:  LOS: 5 days  BP 98/63   Pulse 88   Temp 98.6 F (37 C) (Oral)   Resp (!) 22   Ht 5' 8.5" (1.74 m)   Wt 192 lb 0.3 oz (87.1 kg)   SpO2 100%   BMI 28.77 kg/m   .Intake/Output      02/09 0701 - 02/10 0700   I.V. (mL/kg)    Total Intake(mL/kg)    Urine (mL/kg/hr) 1700 (1.5)   Total Output 1700   Net -1700         . sodium chloride Stopped (09/10/17 0900)  . sodium chloride    . sodium chloride Stopped (09/10/17 0900)  . lactated ringers Stopped (09/09/17 1900)  . lactated ringers 20 mL/hr at 09/14/17 0400  . phenylephrine (NEO-SYNEPHRINE) Adult infusion Stopped (09/14/17 0444)     Lab Results  Component Value Date   WBC 7.8 09/14/2017   HGB 8.8 (L) 09/14/2017   HCT 26.4 (L) 09/14/2017   PLT 143 (L) 09/14/2017   GLUCOSE 151 (H) 09/14/2017   CHOL 190 04/24/2017   TRIG 140 04/24/2017   HDL 69 04/24/2017   LDLCALC 115 (H) 08/03/2016   ALT 25 09/04/2017   AST 24 09/04/2017   NA 134 (L) 09/14/2017   K 3.6 09/14/2017   CL 102 09/14/2017   CREATININE 1.28 (H) 09/14/2017   BUN 32 (H) 09/14/2017   CO2 21 (L) 09/14/2017   TSH 3.28 08/03/2016   PSA 0.7 04/24/2017   INR 1.60 09/09/2017   HGBA1C 5.5 09/04/2017   Walking better Off drips Cr stable 1.28   Delight OvensEdward B Tanner Yeley MD  Beeper [phone] Office [phone] 09/14/2017 7:43 PM

## 2017-09-14 NOTE — Progress Notes (Signed)
5 Days Post-Op Procedure(s) (LRB): AORTIC VALVE REPLACEMENT (AVR) using a 23 Edwards Perimount Magna Ease Aortic Valve (N/A) CORONARY ARTERY BYPASS GRAFTING (CABG) x 4 using the left internal mammary artery and right greater saphenous vein harvested endoscopically. (N/A) TRANSESOPHAGEAL ECHOCARDIOGRAM (TEE) (N/A) Subjective: Off neo- walked in hall cxr clear Will start lasix Objective: Vital signs in last 24 hours: Temp:  [98.3 F (36.8 C)-98.6 F (37 C)] 98.5 F (36.9 C) (02/09 0700) Pulse Rate:  [67-104] 78 (02/09 1100) Cardiac Rhythm: Normal sinus rhythm (02/09 0400) Resp:  [14-25] 16 (02/09 1100) BP: (75-127)/(50-94) 117/73 (02/09 1100) SpO2:  [91 %-100 %] 99 % (02/09 1100) Weight:  [192 lb 0.3 oz (87.1 kg)] 192 lb 0.3 oz (87.1 kg) (02/09 0443)  Hemodynamic parameters for last 24 hours:  nsr  Intake/Output from previous day: 02/08 0701 - 02/09 0700 In: 400 [I.V.:400] Out: 700 [Urine:700] Intake/Output this shift: No intake/output data recorded.       Exam    General- alert and comfortable    Neck- no JVD, no cervical adenopathy palpable, no carotid bruit   Lungs- clear without rales, wheezes   Cor- regular rate and rhythm, no murmur , gallop   Abdomen- soft, non-tender   Extremities - warm, non-tender, minimal edema   Neuro- oriented, appropriate, no focal weakness   Lab Results: Recent Labs    09/13/17 0425 09/14/17 0444  WBC 10.4 7.8  HGB 8.7* 8.8*  HCT 26.1* 26.4*  PLT 116* 143*   BMET:  Recent Labs    09/13/17 0425 09/14/17 0444  NA 135 134*  K 4.0 3.6  CL 103 102  CO2 20* 21*  GLUCOSE 96 151*  BUN 37* 32*  CREATININE 1.51* 1.28*  CALCIUM 8.5* 8.4*    PT/INR: No results for input(s): LABPROT, INR in the last 72 hours. ABG    Component Value Date/Time   PHART 7.338 (L) 09/09/2017 2153   HCO3 21.3 09/09/2017 2153   TCO2 20 (L) 09/10/2017 1617   ACIDBASEDEF 4.0 (H) 09/09/2017 2153   O2SAT 99.0 09/09/2017 2153   CBG (last 3)  Recent  Labs    09/14/17 0259 09/14/17 0337 09/14/17 0747  GLUCAP 116* 108* 96    Assessment/Plan: S/P Procedure(s) (LRB): AORTIC VALVE REPLACEMENT (AVR) using a 23 Edwards Perimount Magna Ease Aortic Valve (N/A) CORONARY ARTERY BYPASS GRAFTING (CABG) x 4 using the left internal mammary artery and right greater saphenous vein harvested endoscopically. (N/A) TRANSESOPHAGEAL ECHOCARDIOGRAM (TEE) (N/A) Mobilize Diuresis start po iron,    LOS: 5 days    Kathlee Nationseter Van Trigt III 09/14/2017

## 2017-09-14 NOTE — Progress Notes (Signed)
Hypoglycemic Event  CBG: 66  Treatment: 4oz of Juice  Symptoms: Asyptomatic  Follow-up CBG: Time:0330 CBG Result:108  Possible Reasons for Event: None noted at this moment      Leonard PotashJoshua D Lassie Padilla

## 2017-09-14 NOTE — Plan of Care (Signed)
Pt ambulated on the unit for a total of 32570ft w/ help of platform walker and states that he is looking forward to completing two laps tomorrow.

## 2017-09-15 ENCOUNTER — Inpatient Hospital Stay (HOSPITAL_COMMUNITY): Payer: Medicare Other

## 2017-09-15 LAB — GLUCOSE, CAPILLARY
Glucose-Capillary: 135 mg/dL — ABNORMAL HIGH (ref 65–99)
Glucose-Capillary: 136 mg/dL — ABNORMAL HIGH (ref 65–99)
Glucose-Capillary: 143 mg/dL — ABNORMAL HIGH (ref 65–99)
Glucose-Capillary: 196 mg/dL — ABNORMAL HIGH (ref 65–99)
Glucose-Capillary: 83 mg/dL (ref 65–99)
Glucose-Capillary: 83 mg/dL (ref 65–99)
Glucose-Capillary: 95 mg/dL (ref 65–99)

## 2017-09-15 LAB — BASIC METABOLIC PANEL
Anion gap: 13 (ref 5–15)
BUN: 27 mg/dL — ABNORMAL HIGH (ref 6–20)
CO2: 22 mmol/L (ref 22–32)
Calcium: 8.6 mg/dL — ABNORMAL LOW (ref 8.9–10.3)
Chloride: 103 mmol/L (ref 101–111)
Creatinine, Ser: 1.37 mg/dL — ABNORMAL HIGH (ref 0.61–1.24)
GFR calc Af Amer: 57 mL/min — ABNORMAL LOW (ref >60)
GFR calc non Af Amer: 50 mL/min — ABNORMAL LOW (ref >60)
Glucose, Bld: 104 mg/dL — ABNORMAL HIGH (ref 65–99)
Potassium: 3.9 mmol/L (ref 3.5–5.1)
Sodium: 138 mmol/L (ref 135–145)

## 2017-09-15 LAB — CBC
HCT: 27.2 % — ABNORMAL LOW (ref 39.0–52.0)
Hemoglobin: 8.8 g/dL — ABNORMAL LOW (ref 13.0–17.0)
MCH: 30.9 pg (ref 26.0–34.0)
MCHC: 32.4 g/dL (ref 30.0–36.0)
MCV: 95.4 fL (ref 78.0–100.0)
Platelets: 154 10*3/uL (ref 150–400)
RBC: 2.85 MIL/uL — ABNORMAL LOW (ref 4.22–5.81)
RDW: 13.8 % (ref 11.5–15.5)
WBC: 8 10*3/uL (ref 4.0–10.5)

## 2017-09-15 LAB — TSH: TSH: 2.891 u[IU]/mL (ref 0.350–4.500)

## 2017-09-15 MED ORDER — AMIODARONE LOAD VIA INFUSION
150.0000 mg | Freq: Once | INTRAVENOUS | Status: AC
  Administered 2017-09-15: 150 mg via INTRAVENOUS
  Filled 2017-09-15: qty 83.34

## 2017-09-15 MED ORDER — AMIODARONE HCL 200 MG PO TABS
200.0000 mg | ORAL_TABLET | Freq: Two times a day (BID) | ORAL | Status: DC
  Administered 2017-09-16 – 2017-09-19 (×6): 200 mg via ORAL
  Filled 2017-09-15 (×7): qty 1

## 2017-09-15 MED ORDER — AMIODARONE HCL IN DEXTROSE 360-4.14 MG/200ML-% IV SOLN
60.0000 mg/h | INTRAVENOUS | Status: AC
  Administered 2017-09-15 (×2): 60 mg/h via INTRAVENOUS
  Filled 2017-09-15 (×2): qty 200

## 2017-09-15 MED ORDER — AMIODARONE HCL 200 MG PO TABS
200.0000 mg | ORAL_TABLET | Freq: Every day | ORAL | Status: DC
  Administered 2017-09-16: 200 mg via ORAL

## 2017-09-15 MED ORDER — AMIODARONE HCL IN DEXTROSE 360-4.14 MG/200ML-% IV SOLN
30.0000 mg/h | INTRAVENOUS | Status: DC
  Administered 2017-09-15: 30 mg/h via INTRAVENOUS
  Filled 2017-09-15: qty 200

## 2017-09-15 NOTE — Progress Notes (Signed)
Patient ID: Leonard Padilla, male   DOB: [birth year], 72 y.o.   MRN: [id number] TCTS DAILY ICU PROGRESS NOTE                   [address]            [place] [address]          [phone]   6 Days Post-Op Procedure(s) (LRB): AORTIC VALVE REPLACEMENT (AVR) using a 23 Edwards Perimount Magna Ease Aortic Valve (N/A) CORONARY ARTERY BYPASS GRAFTING (CABG) x 4 using the left internal mammary artery and right greater saphenous vein harvested endoscopically. (N/A) TRANSESOPHAGEAL ECHOCARDIOGRAM (TEE) (N/A)  Total Length of Stay:  LOS: 6 days   Subjective: Patient awake alert neurologically intact, when I came by to see this morning he was going in and out of atrial fibrillation with periods of sinus rhythm/patient was unaware of it.  Not noted by the EKG monitoring system.   Objective: Vital signs in last 24 hours: Temp:  [97.8 F (36.6 C)-98.9 F (37.2 C)] 98.7 F (37.1 C) (02/10 0700) Pulse Rate:  [76-109] 95 (02/10 0943) Cardiac Rhythm: Normal sinus rhythm (02/10 0800) Resp:  [16-28] 16 (02/10 0800) BP: (90-141)/(51-75) 135/65 (02/10 0943) SpO2:  [92 %-100 %] 95 % (02/10 0800) Weight:  [191 lb 9.3 oz (86.9 kg)] 191 lb 9.3 oz (86.9 kg) (02/10 0545)  Filed Weights   09/13/17 0500 09/14/17 0443 09/15/17 0545  Weight: 192 lb (87.1 kg) 192 lb 0.3 oz (87.1 kg) 191 lb 9.3 oz (86.9 kg)    Weight change: -7.1 oz (-0.2 kg)   Hemodynamic parameters for last 24 hours: CVP:  [6 mmHg] 6 mmHg  Intake/Output from previous day: 02/09 0701 - 02/10 0700 In: -  Out: 3010 [Urine:3010]  Intake/Output this shift: Total I/O In: -  Out: 50 [Urine:50]  Current Meds: Scheduled Meds: . aspirin EC  325 mg Oral Daily   Or  . aspirin  324 mg Per Tube Daily  . atorvastatin  40 mg Oral q1800  . bisacodyl  10 mg Oral Daily   Or  . bisacodyl  10 mg Rectal Daily  . Chlorhexidine Gluconate Cloth  6 each Topical Daily  . docusate sodium  200 mg Oral Daily  . enoxaparin (LOVENOX)  injection  30 mg Subcutaneous Q24H  . ferrous fumarate-b12-vitamic C-folic acid  1 capsule Oral BID WC  . furosemide  20 mg Intravenous BID  . insulin aspart  0-24 Units Subcutaneous Q4H  . mouth rinse  15 mL Mouth Rinse BID  . metoprolol tartrate  12.5 mg Oral BID   Or  . metoprolol tartrate  12.5 mg Per Tube BID  . mupirocin ointment  1 application Nasal BID  . pantoprazole  40 mg Oral Daily  . sertraline  25 mg Oral Daily  . sodium chloride flush  10-40 mL Intracatheter Q12H  . sodium chloride flush  3 mL Intravenous Q12H  . tamsulosin  0.4 mg Oral Daily   Continuous Infusions: . sodium chloride Stopped (09/10/17 0900)  . sodium chloride    . sodium chloride Stopped (09/10/17 0900)  . lactated ringers Stopped (09/09/17 1900)  . lactated ringers 20 mL/hr at 09/14/17 0400  . phenylephrine (NEO-SYNEPHRINE) Adult infusion Stopped (09/14/17 0444)   PRN Meds:.sodium chloride, metoprolol tartrate, morphine injection, ondansetron (ZOFRAN) IV, oxyCODONE, sodium chloride flush, sodium chloride flush, traMADol  General appearance: alert, cooperative and no distress Neurologic: intact Heart: regular rate and rhythm, S1, S2 normal,  no murmur, click, rub or gallop Lungs: diminished breath sounds bibasilar Abdomen: soft, non-tender; bowel sounds normal; no masses,  no organomegaly Extremities: extremities normal, atraumatic, no cyanosis or edema and Homans sign is negative, no sign of DVT Wound: Sternum is stable Foley still in place because of urinary retention has been evaluated by urology,   Lab Results: CBC: Recent Labs    09/14/17 0444 09/15/17 0520  WBC 7.8 8.0  HGB 8.8* 8.8*  HCT 26.4* 27.2*  PLT 143* 154   BMET:  Recent Labs    09/14/17 0444 09/15/17 0520  NA 134* 138  K 3.6 3.9  CL 102 103  CO2 21* 22  GLUCOSE 151* 104*  BUN 32* 27*  CREATININE 1.28* 1.37*  CALCIUM 8.4* 8.6*    CMET: Lab Results  Component Value Date   WBC 8.0 09/15/2017   HGB 8.8 (L)  09/15/2017   HCT 27.2 (L) 09/15/2017   PLT 154 09/15/2017   GLUCOSE 104 (H) 09/15/2017   CHOL 190 04/24/2017   TRIG 140 04/24/2017   HDL 69 04/24/2017   LDLCALC 115 (H) 08/03/2016   ALT 25 09/04/2017   AST 24 09/04/2017   NA 138 09/15/2017   K 3.9 09/15/2017   CL 103 09/15/2017   CREATININE 1.37 (H) 09/15/2017   BUN 27 (H) 09/15/2017   CO2 22 09/15/2017   TSH 3.28 08/03/2016   PSA 0.7 04/24/2017   INR 1.60 09/09/2017   HGBA1C 5.5 09/04/2017   Chronic Kidney Disease   Stage I     GFR >90  Stage II    GFR 60-89  Stage IIIA GFR 45-59  Stage IIIB GFR 30-44  Stage IV   GFR 15-29  Stage V    GFR  <15  Lab Results  Component Value Date   CREATININE 1.37 (H) 09/15/2017   Estimated Creatinine Clearance: 52 mL/min (A) (by C-G formula based on SCr of 1.37 mg/dL (H)).   PT/INR: No results for input(s): LABPROT, INR in the last 72 hours. Radiology: Dg Chest 2 View  Result Date: 09/15/2017 CLINICAL DATA:  CABG EXAM: CHEST  2 VIEW COMPARISON:  09/14/2017 FINDINGS: Right PICC line is unchanged. Prior CABG and valve replacement. Cardiomegaly. Small bilateral pleural effusions with bibasilar atelectasis. Mild vascular congestion. No pneumothorax. IMPRESSION: Cardiomegaly with vascular congestion. Bibasilar atelectasis or infiltrates with small effusions. Electronically Signed   By: Charlett NoseKevin  Dover M.D.   On: 09/15/2017 07:19     Assessment/Plan: S/P Procedure(s) (LRB): AORTIC VALVE REPLACEMENT (AVR) using a 23 Edwards Perimount Magna Ease Aortic Valve (N/A) CORONARY ARTERY BYPASS GRAFTING (CABG) x 4 using the left internal mammary artery and right greater saphenous vein harvested endoscopically. (N/A) TRANSESOPHAGEAL ECHOCARDIOGRAM (TEE) (N/A) Mobilize Diuresis Hold transfer to stepdown until atrial fib controlled We will start IV amiodarone now.    Leonard Padilla 09/15/2017 10:35 AM

## 2017-09-15 NOTE — Progress Notes (Signed)
  Amiodarone Drug - Drug Interaction Consult Note  Recommendations: No medications causing drug interactions.  Monitor VS and electrolytes.  Amiodarone is metabolized by the cytochrome P450 system and therefore has the potential to cause many drug interactions. Amiodarone has an average plasma half-life of 50 days (range 20 to 100 days).   There is potential for drug interactions to occur several weeks or months after stopping treatment and the onset of drug interactions may be slow after initiating amiodarone.   [x]  Statins: Increased risk of myopathy. Simvastatin- restrict dose to 20mg  daily. Other statins: counsel patients to report any muscle pain or weakness immediately.  []  Anticoagulants: Amiodarone can increase anticoagulant effect. Consider warfarin dose reduction. Patients should be monitored closely and the dose of anticoagulant altered accordingly, remembering that amiodarone levels take several weeks to stabilize.  []  Antiepileptics: Amiodarone can increase plasma concentration of phenytoin, the dose should be reduced. Note that small changes in phenytoin dose can result in large changes in levels. Monitor patient and counsel on signs of toxicity.  [x]  Beta blockers: increased risk of bradycardia, AV block and myocardial depression. Sotalol - avoid concomitant use.  []   Calcium channel blockers (diltiazem and verapamil): increased risk of bradycardia, AV block and myocardial depression.  []   Cyclosporine: Amiodarone increases levels of cyclosporine. Reduced dose of cyclosporine is recommended.  []  Digoxin dose should be halved when amiodarone is started.  [x]  Diuretics: increased risk of cardiotoxicity if hypokalemia occurs.  []  Oral hypoglycemic agents (glyburide, glipizide, glimepiride): increased risk of hypoglycemia. Patient's glucose levels should be monitored closely when initiating amiodarone therapy.   []  Drugs that prolong the QT interval:  Torsades de pointes risk  may be increased with concurrent use - avoid if possible.  Monitor QTc, also keep magnesium/potassium WNL if concurrent therapy can't be avoided. Marland Kitchen. Antibiotics: e.g. fluoroquinolones, erythromycin. . Antiarrhythmics: e.g. quinidine, procainamide, disopyramide, sotalol. . Antipsychotics: e.g. phenothiazines, haloperidol.  . Lithium, tricyclic antidepressants, and methadone.   Leota SauersLisa Ineta Sinning Pharm.D. CPP, BCPS Clinical Pharmacist [phone] 09/15/2017 11:29 AM

## 2017-09-15 NOTE — Progress Notes (Signed)
Patient ID: Leonard Padilla, male   DOB: Feb 13, 1946, 72 y.o.   MRN: [id number] EVENING ROUNDS NOTE :     [address]       [place] [id number]             [phone]                 6 Days Post-Op Procedure(s) (LRB): AORTIC VALVE REPLACEMENT (AVR) using a 23 Edwards Perimount Magna Ease Aortic Valve (N/A) CORONARY ARTERY BYPASS GRAFTING (CABG) x 4 using the left internal mammary artery and right greater saphenous vein harvested endoscopically. (N/A) TRANSESOPHAGEAL ECHOCARDIOGRAM (TEE) (N/A)  Total Length of Stay:  LOS: 6 days  BP (!) 144/66   Pulse 85   Temp 99 F (37.2 C) (Oral)   Resp 17   Ht 5' 8.5" (1.74 m)   Wt 191 lb 9.3 oz (86.9 kg)   SpO2 100%   BMI 28.71 kg/m   .Intake/Output      02/10 0701 - 02/11 0700   P.O. 300   I.V. (mL/kg) 366.3 (4.2)   Total Intake(mL/kg) 666.3 (7.7)   Urine (mL/kg/hr) 1700 (1.5)   Total Output 1700   Net -1033.7         . sodium chloride Stopped (09/10/17 0900)  . sodium chloride    . sodium chloride Stopped (09/10/17 0900)  . amiodarone    . lactated ringers Stopped (09/09/17 1900)  . lactated ringers 20 mL/hr at 09/14/17 0400  . phenylephrine (NEO-SYNEPHRINE) Adult infusion Stopped (09/14/17 0444)     Lab Results  Component Value Date   WBC 8.0 09/15/2017   HGB 8.8 (L) 09/15/2017   HCT 27.2 (L) 09/15/2017   PLT 154 09/15/2017   GLUCOSE 104 (H) 09/15/2017   CHOL 190 04/24/2017   TRIG 140 04/24/2017   HDL 69 04/24/2017   LDLCALC 115 (H) 08/03/2016   ALT 25 09/04/2017   AST 24 09/04/2017   NA 138 09/15/2017   K 3.9 09/15/2017   CL 103 09/15/2017   CREATININE 1.37 (H) 09/15/2017   BUN 27 (H) 09/15/2017   CO2 22 09/15/2017   TSH 2.891 09/15/2017   PSA 0.7 04/24/2017   INR 1.60 09/09/2017   HGBA1C 5.5 09/04/2017   Now on IV amnio is converted back to sinus rhythm convert to p.o. amnio tomorrow morning   Delight OvensEdward B Mikayela Deats MD  B[phone] Office [phone] 09/15/2017 7:39 PM

## 2017-09-16 ENCOUNTER — Inpatient Hospital Stay (HOSPITAL_COMMUNITY): Payer: Medicare Other

## 2017-09-16 DIAGNOSIS — R569 Unspecified convulsions: Secondary | ICD-10-CM

## 2017-09-16 DIAGNOSIS — R55 Syncope and collapse: Secondary | ICD-10-CM

## 2017-09-16 LAB — BPAM RBC
Blood Product Expiration Date: 201902222359
Blood Product Expiration Date: 201902222359
Blood Product Expiration Date: 201902222359
Blood Product Expiration Date: 201902222359
ISSUE DATE / TIME: 201902040834
ISSUE DATE / TIME: 201902051959
ISSUE DATE / TIME: 201902071345
Unit Type and Rh: 6200
Unit Type and Rh: 6200
Unit Type and Rh: 6200
Unit Type and Rh: 6200

## 2017-09-16 LAB — TYPE AND SCREEN
ABO/RH(D): A POS
Antibody Screen: NEGATIVE
Unit division: 0
Unit division: 0
Unit division: 0
Unit division: 0

## 2017-09-16 LAB — MAGNESIUM: Magnesium: 1.8 mg/dL (ref 1.7–2.4)

## 2017-09-16 LAB — GLUCOSE, CAPILLARY
Glucose-Capillary: 95 mg/dL (ref 65–99)
Glucose-Capillary: 66 mg/dL (ref 65–99)
Glucose-Capillary: 103 mg/dL — ABNORMAL HIGH (ref 65–99)
Glucose-Capillary: 112 mg/dL — ABNORMAL HIGH (ref 65–99)
Glucose-Capillary: 120 mg/dL — ABNORMAL HIGH (ref 65–99)
Glucose-Capillary: 110 mg/dL — ABNORMAL HIGH (ref 65–99)
Glucose-Capillary: 148 mg/dL — ABNORMAL HIGH (ref 65–99)
Glucose-Capillary: 115 mg/dL — ABNORMAL HIGH (ref 65–99)

## 2017-09-16 LAB — CBC
HCT: 26 % — ABNORMAL LOW (ref 39.0–52.0)
Hemoglobin: 8.5 g/dL — ABNORMAL LOW (ref 13.0–17.0)
MCH: 30.9 pg (ref 26.0–34.0)
MCHC: 32.7 g/dL (ref 30.0–36.0)
MCV: 94.5 fL (ref 78.0–100.0)
Platelets: 154 10*3/uL (ref 150–400)
RBC: 2.75 MIL/uL — ABNORMAL LOW (ref 4.22–5.81)
RDW: 14 % (ref 11.5–15.5)
WBC: 9.1 10*3/uL (ref 4.0–10.5)

## 2017-09-16 LAB — BASIC METABOLIC PANEL
Anion gap: 14 (ref 5–15)
BUN: 21 mg/dL — ABNORMAL HIGH (ref 6–20)
CO2: 23 mmol/L (ref 22–32)
Calcium: 8.6 mg/dL — ABNORMAL LOW (ref 8.9–10.3)
Chloride: 100 mmol/L — ABNORMAL LOW (ref 101–111)
Creatinine, Ser: 1.38 mg/dL — ABNORMAL HIGH (ref 0.61–1.24)
GFR calc Af Amer: 57 mL/min — ABNORMAL LOW (ref >60)
GFR calc non Af Amer: 49 mL/min — ABNORMAL LOW (ref >60)
Glucose, Bld: 121 mg/dL — ABNORMAL HIGH (ref 65–99)
Potassium: 3.4 mmol/L — ABNORMAL LOW (ref 3.5–5.1)
Sodium: 137 mmol/L (ref 135–145)

## 2017-09-16 MED ORDER — TRAMADOL HCL 50 MG PO TABS
50.0000 mg | ORAL_TABLET | ORAL | Status: DC | PRN
  Administered 2017-09-19: 100 mg via ORAL
  Filled 2017-09-16 (×2): qty 2

## 2017-09-16 MED ORDER — POTASSIUM CHLORIDE CRYS ER 20 MEQ PO TBCR: 20.0000 meq | EXTENDED_RELEASE_TABLET | ORAL | Status: DC | PRN

## 2017-09-16 MED ORDER — BISACODYL 5 MG PO TBEC
10.0000 mg | DELAYED_RELEASE_TABLET | Freq: Every day | ORAL | Status: DC | PRN
  Administered 2017-09-17: 10 mg via ORAL
  Filled 2017-09-16: qty 2

## 2017-09-16 MED ORDER — ONDANSETRON HCL 4 MG PO TABS: 4.0000 mg | ORAL_TABLET | Freq: Four times a day (QID) | ORAL | Status: DC | PRN

## 2017-09-16 MED ORDER — SODIUM CHLORIDE 0.9% FLUSH: 3.0000 mL | INTRAVENOUS | Status: DC | PRN

## 2017-09-16 MED ORDER — INSULIN ASPART 100 UNIT/ML ~~LOC~~ SOLN
0.0000 [IU] | Freq: Three times a day (TID) | SUBCUTANEOUS | Status: DC
  Administered 2017-09-16 – 2017-09-18 (×4): 2 [IU] via SUBCUTANEOUS

## 2017-09-16 MED ORDER — FUROSEMIDE 20 MG PO TABS
20.0000 mg | ORAL_TABLET | Freq: Every day | ORAL | Status: DC
  Administered 2017-09-17 – 2017-09-18 (×2): 20 mg via ORAL
  Filled 2017-09-16 (×2): qty 1

## 2017-09-16 MED ORDER — MOVING RIGHT ALONG BOOK
Freq: Once | Status: AC
  Administered 2017-09-16: 11:00:00
  Filled 2017-09-16: qty 1

## 2017-09-16 MED ORDER — ASPIRIN EC 81 MG PO TBEC
81.0000 mg | DELAYED_RELEASE_TABLET | Freq: Every day | ORAL | Status: DC
  Administered 2017-09-17 – 2017-09-19 (×3): 81 mg via ORAL
  Filled 2017-09-16 (×3): qty 1

## 2017-09-16 MED ORDER — PANTOPRAZOLE SODIUM 40 MG PO TBEC
40.0000 mg | DELAYED_RELEASE_TABLET | Freq: Every day | ORAL | Status: DC
  Administered 2017-09-16 – 2017-09-19 (×4): 40 mg via ORAL
  Filled 2017-09-16 (×4): qty 1

## 2017-09-16 MED ORDER — POTASSIUM CHLORIDE CRYS ER 20 MEQ PO TBCR
20.0000 meq | EXTENDED_RELEASE_TABLET | Freq: Three times a day (TID) | ORAL | Status: DC
  Administered 2017-09-16: 20 meq via ORAL
  Filled 2017-09-16: qty 1

## 2017-09-16 MED ORDER — BISACODYL 10 MG RE SUPP: 10.0000 mg | Freq: Every day | RECTAL | Status: DC | PRN

## 2017-09-16 MED ORDER — SODIUM CHLORIDE 0.9 % IV SOLN: 250.0000 mL | INTRAVENOUS | Status: DC | PRN

## 2017-09-16 MED ORDER — SODIUM CHLORIDE 0.9% FLUSH
3.0000 mL | Freq: Two times a day (BID) | INTRAVENOUS | Status: DC
  Administered 2017-09-16 – 2017-09-17 (×2): 3 mL via INTRAVENOUS

## 2017-09-16 MED ORDER — POTASSIUM CHLORIDE CRYS ER 20 MEQ PO TBCR
20.0000 meq | EXTENDED_RELEASE_TABLET | Freq: Every day | ORAL | Status: DC
  Administered 2017-09-16 – 2017-09-17 (×2): 20 meq via ORAL
  Filled 2017-09-16 (×3): qty 1

## 2017-09-16 MED ORDER — METOPROLOL TARTRATE 12.5 MG HALF TABLET
12.5000 mg | ORAL_TABLET | Freq: Two times a day (BID) | ORAL | Status: DC
  Administered 2017-09-16 – 2017-09-19 (×7): 12.5 mg via ORAL
  Filled 2017-09-16 (×7): qty 1

## 2017-09-16 MED ORDER — ONDANSETRON HCL 4 MG/2ML IJ SOLN: 4.0000 mg | Freq: Four times a day (QID) | INTRAMUSCULAR | Status: DC | PRN

## 2017-09-16 NOTE — Consult Note (Signed)
NEURO HOSPITALIST CONSULT NOTE   Requestig physician: Dr. Tyrone Sage  Reason for Consult: Seizure  History obtained from:  Patient, Wife and Chart  HPI:                                                                                                                                          Leonard Padilla is an 72 y.o. male who underwent an aortic valve replacement using a Perimount magna ease aortic valve on 09/09/2017.  During patient's hospital stay he had noted to have multiple syncopal episodes however no cardiac source was found. Today it was noted the patient became unresponsive, with arm shaking and eyes rolled back along with a head turn to the right. Per nurse's note the patient immediately came back to responsiveness again and was able to follow commands equally.  Patient was oriented with clear speech.  At this point in time there is a question if this could be possibly seizure activity versus syncopal convulsion.  Looking through past notes he has had a right basal ganglia infarct back in 2017.  No other head imaging is noted.  Past Medical History:  Diagnosis Date  . Aortic stenosis    moderate AS 12/01/15 echo (Dr. Elberta Fortis)  . Arthritis   . CKD (chronic kidney disease) stage 3, GFR 30-59 ml/min (HCC) 08/04/2016  . CKD stage G3a/A1, GFR 45-59 and albumin creatinine ratio <30 mg/g (HCC) 08/04/2016  . Coronary artery disease   . Dysthymia 03/18/2017  . 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 Essential hypertension 10/13/2015  . Flu 09/2015   influenza A  . Former smoker, stopped smoking in distant past   . GERD (gastroesophageal reflux disease)    occ  . Heart murmur   . History of colon polyps   . Hyperlipidemia   . 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 Pneumonia 09/2015  . Postural dizziness with presyncope 08/27/2016  . Psoriasis   . Sleep apnea    to pick cpap tomorrow 12/13/15  . Stroke (HCC) 09/2015  . Symptomatic carotid  artery stenosis 12/15/2015  . 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 Wears glasses     Past Surgical History:  Procedure Laterality Date  . AORTIC VALVE REPLACEMENT N/A 09/09/2017   Procedure: AORTIC VALVE REPLACEMENT (AVR) using a 23 Edwards Perimount Magna Ease Aortic Valve;  Surgeon: Delight Ovens, MD;  Location: [facility] OR;  Service: Open Heart Surgery;  Laterality: N/A;  . COLONOSCOPY W/ BIOPSIES AND POLYPECTOMY    . CORONARY ARTERY BYPASS GRAFT N/A 09/09/2017   Procedure: CORONARY ARTERY BYPASS GRAFTING (CABG) x 4 using the left internal mammary artery and right greater saphenous vein harvested endoscopically.;  Surgeon: Delight Ovens, MD;  Location: [facility] OR;  Service: Open Heart Surgery;  Laterality: N/A;  . ENDARTERECTOMY Right 12/15/2015   Procedure: RIGHT CAROTID ENDARTERECTOMY WITH PATCH ANGIOPLASTY;  Surgeon: Nada Libman, MD;  Location: [facility] OR;  Service: Vascular;  Laterality: Right;  . PATCH ANGIOPLASTY Right 12/15/2015   Procedure: RIGHT CAROTID PATCH ANGIOPLASTY;  Surgeon: Nada Libman, MD;  Location: [facility] OR;  Service: Vascular;  Laterality: Right;  . RIGHT/LEFT HEART CATH AND CORONARY ANGIOGRAPHY N/A 08/23/2017   Procedure: RIGHT/LEFT HEART CATH AND CORONARY ANGIOGRAPHY;  Surgeon: Swazil[place], Peter M, MD;  [facility] INVASIVE CV LAB;  Service: Cardiovascular;  Laterality: N/A;  . TEE WITHOUT CARDIOVERSION N/A 09/09/2017   Procedure: TRANSESOPHAGEAL ECHOCARDIOGRAM (TEE);  Surgeon: Delight Ovens, MD;  Location: [facility];  Service: Open Heart Surgery;  Laterality: N/A;  . WISDOM TOOTH EXTRACTION      Family History  Problem Relation Age of Onset  . Cancer Mother        ovarian cancer  . Cancer Father        Prostate    Social History:  reports that he quit smoking about 34 years ago. he has never used smokeless tobacco. He reports that he drinks alcohol. He reports that he does not use drugs.  Allergies  Allergen Reactions  . Viagra [Sildenafil Citrate] Other  (See Comments)    COLOR DISCRIMINATION [DOSE RELATED] "blue sensation"    MEDICATIONS:                                                                                                                     Scheduled: . amiodarone  200 mg Oral Q12H   Followed by  . [START ON 09/23/2017] amiodarone  200 mg Oral Daily  . aspirin EC  81 mg Oral Daily  . atorvastatin  40 mg Oral q1800  . Chlorhexidine Gluconate Cloth  6 each Topical Daily  . ferrous fumarate-b12-vitamic C-folic acid  1 capsule Oral BID WC  . furosemide  20 mg Oral Daily  . insulin aspart  0-24 Units Subcutaneous TID AC & HS  . mouth rinse  15 mL Mouth Rinse BID  . metoprolol tartrate  12.5 mg Oral BID  . pantoprazole  40 mg Oral QAC breakfast  . potassium chloride  20 mEq Oral Daily  . sertraline  25 mg Oral Daily  . sodium chloride flush  10-40 mL Intracatheter Q12H  . sodium chloride flush  3 mL Intravenous Q12H  . tamsulosin  0.4 mg Oral Daily     ROS:  History obtained from the patient  General ROS: negative for - chills, fatigue, fever, night sweats, weight gain or weight loss Psychological ROS: negative for - behavioral disorder, hallucinations, memory difficulties, mood swings or suicidal ideation Ophthalmic ROS: negative for - blurry vision, double vision, eye pain or loss of vision ENT ROS: negative for - epistaxis, nasal discharge, oral lesions, sore throat, tinnitus or vertigo Allergy and Immunology ROS: negative for - hives or itchy/watery eyes Hematological and Lymphatic ROS: negative for - bleeding problems, bruising or swollen lymph nodes Endocrine ROS: negative for - galactorrhea, hair pattern changes, polydipsia/polyuria or temperature intolerance Respiratory ROS: negative for - cough, hemoptysis, shortness of breath or wheezing Cardiovascular ROS: negative for - chest  pain, dyspnea on exertion, edema or irregular heartbeat Gastrointestinal ROS: negative for - abdominal pain, diarrhea, hematemesis, nausea/vomiting or stool incontinence Genito-Urinary ROS: negative for - dysuria, hematuria, incontinence or urinary frequency/urgency Musculoskeletal ROS: negative for - joint swelling or muscular weakness Neurological ROS: as noted in HPI Dermatological ROS: negative for rash and skin lesion changes   Blood pressure 121/64, pulse 82, temperature 98 F (36.7 C), temperature source Oral, resp. rate (!) 27, height 5' 8.5" (1.74 m), weight 86.2 kg (190 lb), SpO2 93 %.   General Examination:                                                                                                       Physical Exam  HEENT-  Normocephalic, no lesions, without obvious abnormality.  Normal external eye and conjunctiva.   Cardiovascular- S1-S2 audible, pulses palpable throughout   Lungs-no rhonchi or wheezing noted, no excessive working breathing.  Saturations within normal limits Abdomen- All 4 quadrants palpated and nontender Extremities- Warm, dry and intact Musculoskeletal-no joint tenderness, deformity or swelling Skin-warm and dry, no hyperpigmentation, vitiligo, or suspicious lesions  Neurological Examination Mental Status: Alert, oriented, thought content appropriate.  Speech fluent without evidence of aphasia.  Able to follow 3 step commands without difficulty. Cranial Nerves: II: Discs flat bilaterally; Visual fields grossly normal,  III,IV, VI: ptosis not present, extra-ocular motions intact bilaterally pupils equal, round, reactive to light and accommodation V,VII: smile symmetric, facial light touch sensation normal bilaterally VIII: hearing normal bilaterally IX,X: uvula rises symmetrically XI: bilateral shoulder shrug XII: midline tongue extension Motor: Right : Upper extremity   5/5    Left:     Upper extremity   5/5  Lower extremity   5/5     Lower  extremity   5/5 Tone and bulk:normal tone throughout; no atrophy noted Sensory: Pinprick and light touch intact throughout, bilaterally Deep Tendon Reflexes: 2+ and symmetric throughout Plantars: Right: downgoing   Left: downgoing Cerebellar: normal finger-to-nose, normal rapid alternating movements and normal heel-to-shin test Gait: normal gait and station   Lab Results: Basic Metabolic Panel: Recent Labs  Lab 09/09/17 2044  09/10/17 0308 09/10/17 1604  09/12/17 0337 09/13/17 0425 09/14/17 0444 09/15/17 0520 09/16/17 0327  NA  --    < > 137  --    < > 136 135 134* 138 137  K  --    < > 4.3  --    < > 3.7 4.0 3.6 3.9 3.4*  CL  --    < > 108  --    < > 102 103 102 103 100*  CO2  --   --  20*  --    < > 24 20* 21* 22 23  GLUCOSE  --    < > 115*  --    < > 112* 96 151* 104* 121*  BUN  --    < > 20  --    < > 30* 37* 32* 27* 21*  CREATININE 1.18   < > 1.29* 1.31*   < > 1.34* 1.51* 1.28* 1.37* 1.38*  CALCIUM  --   --  8.3*  --    < > 8.9 8.5* 8.4* 8.6* 8.6*  MG 2.4  --  2.2 2.3  --   --   --   --   --  1.8   < > = values in this interval not displayed.    CBC: Recent Labs  Lab 09/12/17 1813 09/13/17 0425 09/14/17 0444 09/15/17 0520 09/16/17 0327  WBC 11.4* 10.4 7.8 8.0 9.1  HGB 9.2* 8.7* 8.8* 8.8* 8.5*  HCT 28.1* 26.1* 26.4* 27.2* 26.0*  MCV 94.0 93.9 94.3 95.4 94.5  PLT 97* 116* 143* 154 154    Cardiac Enzymes: No results for input(s): CKTOTAL, CKMB, CKMBINDEX, TROPONINI in the last 168 hours.  Lipid Panel: No results for input(s): CHOL, TRIG, HDL, CHOLHDL, VLDL, LDLCALC in the last 168 hours.  Imaging: Dg Chest 2 View  Result Date: 09/15/2017 CLINICAL DATA:  CABG EXAM: CHEST  2 VIEW COMPARISON:  09/14/2017 FINDINGS: Right PICC line is unchanged. Prior CABG and valve replacement. Cardiomegaly. Small bilateral pleural effusions with bibasilar atelectasis. Mild vascular congestion. No pneumothorax. IMPRESSION: Cardiomegaly with vascular congestion. Bibasilar  atelectasis or infiltrates with small effusions. Electronically Signed   By: Charlett Nose M.D.   On: 09/15/2017 07:19   Assessment and plan per attending neurologist. Leonard Morn PA-C [facility] [phone] 09/16/2017, 2:23 PM  Assessment/Plan:  Spells of lightheadedness with episode of LOC and jerking today suggestive of tussive syncope with associated syncopal convulsion.  1. Per nurse's note the patient immediately came back to responsiveness following the spell and was able to follow commands equally. This makes syncopal convulsion significantly more likely than epileptic seizure as there was no postictal state.  2. Not likely to be cardiogenic or vasovagal syncope as rhythm strip was reviewed by primary team and was normal during the spell.  3. Obtain EEG.  Addendum: EEG came back negative. No indication for an anticonvulsant. Will need work up for non-cardiogenic etiologies for syncope, most likely precipitated by transiently decreased systemic vascular tone.   Electronically signed: Dr. Caryl Pina

## 2017-09-16 NOTE — Progress Notes (Signed)
Physical Therapy Treatment Patient Details Name: Leonard Padilla MRN: [id number] DOB: November 07, 1945 Today's Date: 09/16/2017    History of Present Illness Pt is a 72 y.o. male s/p AVR, CABGx4, and TEE on 09/09/17. PMH includes CVA (09/2015), HTN, CAD, CKD, arthritis, aortic stenosis, sleep apnea.   PT Comments    Pt has progressed well with mobility. Mod indep ambulating 1000+' with Eva walker; pt able to amb with no UE support, but prefers use of walker for comfort. Good ability to maintain sternal precautions throughout. Pt has no questions or concerns. Will d/c acute PT.   Follow Up Recommendations  No PT follow up;Supervision - Intermittent     Equipment Recommendations  None recommended by PT    Recommendations for Other Services       Precautions / Restrictions Precautions Precautions: Sternal Restrictions Other Position/Activity Restrictions: Sternal    Mobility  Bed Mobility               General bed mobility comments: Received sitting in chair  Transfers Overall transfer level: Modified independent Equipment used: None;4-wheeled walker Transfers: Sit to/from Stand           General transfer comment: Good ability to stand while maintaining sternal precautions  Ambulation/Gait Ambulation/Gait assistance: Modified independent (Device/Increase time) Ambulation Distance (Feet): 1150 Feet Assistive device: 4-wheeled walker       General Gait Details: Pt able to ambulate with no UE support; prefers use of RW for comfort. Mod indep with use of Oncologist Rankin (Stroke Patients Only)       Balance Overall balance assessment: Needs assistance   Sitting balance-Leahy Scale: Good       Standing balance-Leahy Scale: Fair                              Cognition Arousal/Alertness: Awake/alert Behavior During Therapy: WFL for tasks assessed/performed Overall Cognitive Status: Within  Functional Limits for tasks assessed                                        Exercises      General Comments        Pertinent Vitals/Pain Pain Assessment: No/denies pain    Home Living                      Prior Function            PT Goals (current goals can now be found in the care plan section) Progress towards PT goals: Goals met/education completed, patient discharged from PT    Frequency    Min 3X/week      PT Plan Current plan remains appropriate    Co-evaluation              AM-PAC PT "6 Clicks" Daily Activity  Outcome Measure  Difficulty turning over in bed (including adjusting bedclothes, sheets and blankets)?: None Difficulty moving from lying on back to sitting on the side of the bed? : None Difficulty sitting down on and standing up from a chair with arms (e.g., wheelchair, bedside commode, etc,.)?: None Help needed moving to and from a bed to chair (including a wheelchair)?: None Help needed walking in hospital room?: None Help needed climbing  3-5 steps with a railing? : A Little 6 Click Score: 23    End of Session Equipment Utilized During Treatment: Gait belt Activity Tolerance: Patient tolerated treatment well Patient left: in chair;with call bell/phone within reach;with nursing/sitter in room Nurse Communication: Mobility status PT Visit Diagnosis: Other abnormalities of gait and mobility (R26.89)     Time: 9242-6834 PT Time Calculation (min) (ACUTE ONLY): 18 min  Charges:  $Gait Training: 8-22 mins                    G Codes:      Mabeline Caras, PT, DPT Acute Rehab Services  Pager: [place] 09/16/2017, 8:31 AM

## 2017-09-16 NOTE — Plan of Care (Signed)
  Progressing Education: Knowledge of General Education information will improve 09/16/2017 0954 - Progressing by Burman RiisKeaton, Mateja Dier P, RN Clinical Measurements: Will remain free from infection 09/16/2017 0954 - Progressing by Burman RiisKeaton, Rayhan Groleau P, RN Cardiovascular complication will be avoided 09/16/2017 0954 - Progressing by Burman RiisKeaton, Lynden Flemmer P, RN Elimination: Will not experience complications related to urinary retention 09/16/2017 0954 - Progressing by Burman RiisKeaton, Blakelyn Dinges P, RN Education: Ability to demonstrate proper wound care will improve 09/16/2017 0954 - Progressing by Burman RiisKeaton, Latarra Eagleton P, RN Knowledge of disease or condition will improve 09/16/2017 0954 - Progressing by Burman RiisKeaton, Vamsi Apfel P, RN Knowledge of the prescribed therapeutic regimen will improve 09/16/2017 0954 - Progressing by Burman RiisKeaton, Clarine Elrod P, RN Clinical Measurements: Postoperative complications will be avoided or minimized 09/16/2017 0954 - Progressing by Burman RiisKeaton, Leanette Eutsler P, RN Skin Integrity: Wound healing without signs and symptoms of infection 09/16/2017 0954 - Progressing by Burman RiisKeaton, Desmund Elman P, RN

## 2017-09-16 NOTE — Plan of Care (Signed)
Transferred to bed 16

## 2017-09-16 NOTE — Plan of Care (Signed)
Patient became unresponsive, with arms shaking, eyes rolled back and head turned to the right.Immediately became responsive again, following commands, MOE x4 equally, pt oriented with clear speech. Will continue to assess, no events on cardiac monitor. Wife at bedside. Patient placed back in bed. Will talk with MD when he gets here with another pt coming soon.

## 2017-09-16 NOTE — Progress Notes (Signed)
EEG complete - results pending 

## 2017-09-16 NOTE — Progress Notes (Signed)
Patient ID: Leonard Padilla, male   DOB: 21-Mar-1946, 72 y.o.   MRN: [id number] [facility] DAILY ICU PROGRESS NOTE                   [address]            [place] [address]          [phone]   7 Days Post-Op Procedure(s) (LRB): AORTIC VALVE REPLACEMENT (AVR) using a 23 Edwards Perimount Magna Ease Aortic Valve (N/A) CORONARY ARTERY BYPASS GRAFTING (CABG) x 4 using the left internal mammary artery and right greater saphenous vein harvested endoscopically. (N/A) TRANSESOPHAGEAL ECHOCARDIOGRAM (TEE) (N/A)  Total Length of Stay:  LOS: 7 days   Subjective: Patient feels better this morning, maintaining sinus rhythm walking well 3 or 4 times around the unit without difficulty  Objective: Vital signs in last 24 hours: Temp:  [97.8 F (36.6 C)-99.2 F (37.3 C)] 98.7 F (37.1 C) (02/11 0334) Pulse Rate:  [69-108] 76 (02/11 0700) Cardiac Rhythm: Normal sinus rhythm (02/11 0749) Resp:  [11-26] 22 (02/11 0700) BP: (93-144)/(51-83) 113/60 (02/11 0700) SpO2:  [95 %-100 %] 98 % (02/11 0700) Weight:  [190 lb (86.2 kg)] 190 lb (86.2 kg) (02/11 0400)  Filed Weights   09/14/17 0443 09/15/17 0545 09/16/17 0400  Weight: 192 lb 0.3 oz (87.1 kg) 191 lb 9.3 oz (86.9 kg) 190 lb (86.2 kg)    Weight change: -9.3 oz (-0.717 kg)   Hemodynamic parameters for last 24 hours:    Intake/Output from previous day: 02/10 0701 - 02/11 0700 In: 850 [P.O.:300; I.V.:550] Out: 2700 [Urine:2700]  Intake/Output this shift: No intake/output data recorded.  Current Meds: Scheduled Meds: . amiodarone  200 mg Oral Q12H   Followed by  . [START ON 09/23/2017] amiodarone  200 mg Oral Daily  . aspirin EC  325 mg Oral Daily   Or  . aspirin  324 mg Per Tube Daily  . atorvastatin  40 mg Oral q1800  . bisacodyl  10 mg Oral Daily   Or  . bisacodyl  10 mg Rectal Daily  . Chlorhexidine Gluconate Cloth  6 each Topical Daily  . docusate sodium  200 mg Oral Daily  . enoxaparin (LOVENOX) injection  30 mg  Subcutaneous Q24H  . ferrous fumarate-b12-vitamic C-folic acid  1 capsule Oral BID WC  . furosemide  20 mg Intravenous BID  . insulin aspart  0-24 Units Subcutaneous Q4H  . mouth rinse  15 mL Mouth Rinse BID  . metoprolol tartrate  12.5 mg Oral BID   Or  . metoprolol tartrate  12.5 mg Per Tube BID  . pantoprazole  40 mg Oral Daily  . potassium chloride  20 mEq Oral TID  . sertraline  25 mg Oral Daily  . sodium chloride flush  10-40 mL Intracatheter Q12H  . sodium chloride flush  3 mL Intravenous Q12H  . tamsulosin  0.4 mg Oral Daily   Continuous Infusions: . sodium chloride Stopped (09/10/17 0900)  . sodium chloride    . sodium chloride Stopped (09/10/17 0900)  . amiodarone Stopped (09/16/17 0756)  . lactated ringers Stopped (09/09/17 1900)  . lactated ringers 20 mL/hr at 09/14/17 0400  . phenylephrine (NEO-SYNEPHRINE) Adult infusion Stopped (09/14/17 0444)   PRN Meds:.sodium chloride, metoprolol tartrate, morphine injection, ondansetron (ZOFRAN) IV, oxyCODONE, sodium chloride flush, sodium chloride flush, traMADol  General appearance: alert and cooperative Neurologic: intact Heart: regular rate and rhythm, S1, S2 normal, no murmur, click, rub or  gallop Lungs: diminished breath sounds bibasilar Abdomen: soft, non-tender; bowel sounds normal; no masses,  no organomegaly Extremities: extremities normal, atraumatic, no cyanosis or edema and Homans sign is negative, no sign of DVT Wound: Sternum is stable  Lab Results: CBC: Recent Labs    09/15/17 0520 09/16/17 0327  WBC 8.0 9.1  HGB 8.8* 8.5*  HCT 27.2* 26.0*  PLT 154 154   BMET:  Recent Labs    09/15/17 0520 09/16/17 0327  NA 138 137  K 3.9 3.4*  CL 103 100*  CO2 22 23  GLUCOSE 104* 121*  BUN 27* 21*  CREATININE 1.37* 1.38*  CALCIUM 8.6* 8.6*    CMET: Lab Results  Component Value Date   WBC 9.1 09/16/2017   HGB 8.5 (L) 09/16/2017   HCT 26.0 (L) 09/16/2017   PLT 154 09/16/2017   GLUCOSE 121 (H)  09/16/2017   CHOL 190 04/24/2017   TRIG 140 04/24/2017   HDL 69 04/24/2017   LDLCALC 115 (H) 08/03/2016   ALT 25 09/04/2017   AST 24 09/04/2017   NA 137 09/16/2017   K 3.4 (L) 09/16/2017   CL 100 (L) 09/16/2017   CREATININE 1.38 (H) 09/16/2017   BUN 21 (H) 09/16/2017   CO2 23 09/16/2017   TSH 2.891 09/15/2017   PSA 0.7 04/24/2017   INR 1.60 09/09/2017   HGBA1C 5.5 09/04/2017      PT/INR: No results for input(s): LABPROT, INR in the last 72 hours. Radiology: No results found.   Assessment/Plan: S/P Procedure(s) (LRB): AORTIC VALVE REPLACEMENT (AVR) using a 23 Edwards Perimount Magna Ease Aortic Valve (N/A) CORONARY ARTERY BYPASS GRAFTING (CABG) x 4 using the left internal mammary artery and right greater saphenous vein harvested endoscopically. (N/A) TRANSESOPHAGEAL ECHOCARDIOGRAM (TEE) (N/A) Mobilize Diabetes control Plan for transfer to step-down: see transfer orders Maintaining sinus rhythm    Delight Ovensdward B Kynslei Art 09/16/2017 8:17 AM

## 2017-09-16 NOTE — Progress Notes (Signed)
T[facility] BRIEF SICU PROGRESS NOTE  7 Days Post-Op  S/P Procedure(s) (LRB): AORTIC VALVE REPLACEMENT (AVR) using a 23 Edwards Perimount Magna Ease Aortic Valve (N/A) CORONARY ARTERY BYPASS GRAFTING (CABG) x 4 using the left internal mammary artery and right greater saphenous vein harvested endoscopically. (N/A) TRANSESOPHAGEAL ECHOCARDIOGRAM (TEE) (N/A)   Stable day  Plan: Awaiting bed for transfer out of ICU  Purcell Nailslarence H Owen, MD 09/16/2017 6:16 PM

## 2017-09-16 NOTE — Procedures (Signed)
EEG Report  Clinical History:  S/P Aortic Valve replacement 09/09/2017 and episodes suggestive of possible generalized seizures.  Technical Summary:  A 19 channel digital EEG recording was performed using the 10-20 international system of electrode placement.  Bipolar and Referential montages were used.  The total recording time was approx 20 minutes.  Findings:  There is a posterior dominant rhythm of 8-9 Hz reactive to eye opening and closure.  No focal slowing is present.  There are no epileptiform discharges or electrographic seizures present.  Hyperventilation and photic stimulation are not performed.  Sleep is not recorded.  Impression:  This is a normal EEG in the awake state. However, this does not rule out seizures.  If clinical suspicion remains high, then a sleep deprived EEG and /or a prolonged EEG may be of additional value.   Weston SettleShervin Shaylee Stanislawski, MS, MD

## 2017-09-17 ENCOUNTER — Inpatient Hospital Stay (HOSPITAL_COMMUNITY): Payer: Medicare Other

## 2017-09-17 LAB — BASIC METABOLIC PANEL
Anion gap: 12 (ref 5–15)
BUN: 19 mg/dL (ref 6–20)
CO2: 25 mmol/L (ref 22–32)
Calcium: 8.8 mg/dL — ABNORMAL LOW (ref 8.9–10.3)
Chloride: 100 mmol/L — ABNORMAL LOW (ref 101–111)
Creatinine, Ser: 1.31 mg/dL — ABNORMAL HIGH (ref 0.61–1.24)
GFR calc Af Amer: >60 mL/min (ref >60)
GFR calc non Af Amer: 52 mL/min — ABNORMAL LOW (ref >60)
Glucose, Bld: 119 mg/dL — ABNORMAL HIGH (ref 65–99)
Potassium: 3.9 mmol/L (ref 3.5–5.1)
Sodium: 137 mmol/L (ref 135–145)

## 2017-09-17 LAB — CBC
HCT: 26.6 % — ABNORMAL LOW (ref 39.0–52.0)
Hemoglobin: 8.5 g/dL — ABNORMAL LOW (ref 13.0–17.0)
MCH: 30.6 pg (ref 26.0–34.0)
MCHC: 32 g/dL (ref 30.0–36.0)
MCV: 95.7 fL (ref 78.0–100.0)
Platelets: 187 10*3/uL (ref 150–400)
RBC: 2.78 MIL/uL — ABNORMAL LOW (ref 4.22–5.81)
RDW: 13.9 % (ref 11.5–15.5)
WBC: 8.1 10*3/uL (ref 4.0–10.5)

## 2017-09-17 LAB — GLUCOSE, CAPILLARY
Glucose-Capillary: 136 mg/dL — ABNORMAL HIGH (ref 65–99)
Glucose-Capillary: 124 mg/dL — ABNORMAL HIGH (ref 65–99)
Glucose-Capillary: 101 mg/dL — ABNORMAL HIGH (ref 65–99)
Glucose-Capillary: 120 mg/dL — ABNORMAL HIGH (ref 65–99)
Glucose-Capillary: 101 mg/dL — ABNORMAL HIGH (ref 65–99)

## 2017-09-17 NOTE — Progress Notes (Signed)
Report called to RN on 4E. 

## 2017-09-17 NOTE — Progress Notes (Signed)
Patient ID: Leonard Padilla, male   DOB: 1946-06-09, 72 y.o.   MRN: [id number] [facility] DAILY ICU PROGRESS NOTE                   [address]            [place] [address]          [phone]   8 Days Post-Op Procedure(s) (LRB): AORTIC VALVE REPLACEMENT (AVR) using a 23 Edwards Perimount Magna Ease Aortic Valve (N/A) CORONARY ARTERY BYPASS GRAFTING (CABG) x 4 using the left internal mammary artery and right greater saphenous vein harvested endoscopically. (N/A) TRANSESOPHAGEAL ECHOCARDIOGRAM (TEE) (N/A)  Total Length of Stay:  LOS: 8 days   Subjective: Feels well this morning without complaints stable night.  Objective: Vital signs in last 24 hours: Temp:  [97.7 F (36.5 C)-98.9 F (37.2 C)] 97.7 F (36.5 C) (02/12 0700) Pulse Rate:  [72-95] 81 (02/12 0700) Cardiac Rhythm: Normal sinus rhythm (02/12 0619) Resp:  [17-28] 20 (02/12 0700) BP: (98-137)/(55-97) 135/97 (02/12 0700) SpO2:  [92 %-100 %] 96 % (02/12 0700) Weight:  [189 lb 9.6 oz (86 kg)] 189 lb 9.6 oz (86 kg) (02/12 0500)  Filed Weights   09/15/17 0545 09/16/17 0400 09/17/17 0500  Weight: 191 lb 9.3 oz (86.9 kg) 190 lb (86.2 kg) 189 lb 9.6 oz (86 kg)    Weight change: -6.4 oz (-0.181 kg)   Hemodynamic parameters for last 24 hours:    Intake/Output from previous day: 02/11 0701 - 02/12 0700 In: 1210 [P.O.:1200; I.V.:10] Out: 1515 [Urine:1515]  Intake/Output this shift: No intake/output data recorded.  Current Meds: Scheduled Meds: . amiodarone  200 mg Oral Q12H   Followed by  . [START ON 09/23/2017] amiodarone  200 mg Oral Daily  . aspirin EC  81 mg Oral Daily  . atorvastatin  40 mg Oral q1800  . Chlorhexidine Gluconate Cloth  6 each Topical Daily  . ferrous fumarate-b12-vitamic C-folic acid  1 capsule Oral BID WC  . furosemide  20 mg Oral Daily  . insulin aspart  0-24 Units Subcutaneous TID AC & HS  . mouth rinse  15 mL Mouth Rinse BID  . metoprolol tartrate  12.5 mg Oral BID  .  pantoprazole  40 mg Oral QAC breakfast  . potassium chloride  20 mEq Oral Daily  . sertraline  25 mg Oral Daily  . sodium chloride flush  10-40 mL Intracatheter Q12H  . sodium chloride flush  3 mL Intravenous Q12H  . tamsulosin  0.4 mg Oral Daily   Continuous Infusions: . sodium chloride     PRN Meds:.sodium chloride, bisacodyl **OR** bisacodyl, ondansetron **OR** ondansetron (ZOFRAN) IV, sodium chloride flush, sodium chloride flush, traMADol  General appearance: alert and cooperative Neurologic: intact Heart: regular rate and rhythm, S1, S2 normal, no murmur, click, rub or gallop Lungs: clear to auscultation bilaterally Abdomen: soft, non-tender; bowel sounds normal; no masses,  no organomegaly Extremities: extremities normal, atraumatic, no cyanosis or edema Wound: Sternum is stable  Lab Results: CBC: Recent Labs    09/16/17 0327 09/17/17 0525  WBC 9.1 8.1  HGB 8.5* 8.5*  HCT 26.0* 26.6*  PLT 154 187   BMET:  Recent Labs    09/16/17 0327 09/17/17 0525  NA 137 137  K 3.4* 3.9  CL 100* 100*  CO2 23 25  GLUCOSE 121* 119*  BUN 21* 19  CREATININE 1.38* 1.31*  CALCIUM 8.6* 8.8*    CMET: Lab Results  Component  Value Date   WBC 8.1 09/17/2017   HGB 8.5 (L) 09/17/2017   HCT 26.6 (L) 09/17/2017   PLT 187 09/17/2017   GLUCOSE 119 (H) 09/17/2017   CHOL 190 04/24/2017   TRIG 140 04/24/2017   HDL 69 04/24/2017   LDLCALC 115 (H) 08/03/2016   ALT 25 09/04/2017   AST 24 09/04/2017   NA 137 09/17/2017   K 3.9 09/17/2017   CL 100 (L) 09/17/2017   CREATININE 1.31 (H) 09/17/2017   BUN 19 09/17/2017   CO2 25 09/17/2017   TSH 2.891 09/15/2017   PSA 0.7 04/24/2017   INR 1.60 09/09/2017   HGBA1C 5.5 09/04/2017      PT/INR: No results for input(s): LABPROT, INR in the last 72 hours. Radiology: Dg Chest 2 View  Result Date: 09/17/2017 CLINICAL DATA:  Status post cardiac surgery with shortness of Breath EXAM: CHEST  2 VIEW COMPARISON:  09/15/2017 FINDINGS:  Postoperative changes are again seen. Aortic calcifications are noted. Right-sided PICC line is seen in satisfactory position. Minimal blunting of the costophrenic angles is again seen. Bibasilar lower lobe changes are again noted. No pneumothorax is seen. IMPRESSION: Stable bibasilar changes Electronically Signed   By: Alcide CleverMark  Lukens M.D.   On: 09/17/2017 07:50   Ct Head Wo Contrast  Result Date: 09/16/2017 CLINICAL DATA:  Recent heart surgery with 3 episodes of possible seizure. EXAM: CT HEAD WITHOUT CONTRAST TECHNIQUE: Contiguous axial images were obtained from the base of the skull through the vertex without intravenous contrast. COMPARISON:  None. FINDINGS: Brain: Mild low density in the periventricular white matter likely related to small vessel disease. 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 No mass lesion, hemorrhage, hydrocephalus, acute infarct, intra-axial, or extra-axial fluid collection. Vascular: Intracranial atherosclerosis. Skull: Normal Sinuses/Orbits: Normal imaged portions of the orbits and globes. Mucosal thickening of bilateral maxillary sinuses and ethmoid air cells. Hypoplastic left frontal sinus. Clear mastoid air cells. Other: None. IMPRESSION: 1.  No acute intracranial abnormality. 2. Small vessel ischemic change. Electronically Signed   By: Jeronimo GreavesKyle  Talbot M.D.   On: 09/16/2017 14:58     Assessment/Plan: S/P Procedure(s) (LRB): AORTIC VALVE REPLACEMENT (AVR) using a 23 Edwards Perimount Magna Ease Aortic Valve (N/A) CORONARY ARTERY BYPASS GRAFTING (CABG) x 4 using the left internal mammary artery and right greater saphenous vein harvested endoscopically. (N/A) TRANSESOPHAGEAL ECHOCARDIOGRAM (TEE) (N/A) Mobilize Diuresis Plan for transfer to step-down: see transfer orders Patient would like to try a voiding trial with removal of his Foley this morning, he notes that he feels much better than several days ago when he attempted Remove Foley today with follow-up  bladder scan and monitoring his urine output and replace if necessary    Delight Ovensdward B Dezhane Staten 09/17/2017 8:08 AM

## 2017-09-17 NOTE — Progress Notes (Signed)
Foley catheter removed per MD at 0800. Pt aware that he needs to attempt to urinate before 2pm. RN will continue to monitor.

## 2017-09-17 NOTE — Progress Notes (Signed)
RN ushered into pts room, pts family stated "he's doing it again". When RN entered room, pt leaning to the side in bed. Pt eyes opened wide and he could state his name and birthday. BP elevated, but HR stable. No events on the tele monitor. Pt and wife stated that prior to episode they were laughing. Pts wife stated it was very similar to previous syncopal episodes.

## 2017-09-18 LAB — GLUCOSE, CAPILLARY
Glucose-Capillary: 121 mg/dL — ABNORMAL HIGH (ref 65–99)
Glucose-Capillary: 143 mg/dL — ABNORMAL HIGH (ref 65–99)
Glucose-Capillary: 116 mg/dL — ABNORMAL HIGH (ref 65–99)
Glucose-Capillary: 103 mg/dL — ABNORMAL HIGH (ref 65–99)

## 2017-09-18 LAB — BASIC METABOLIC PANEL
Anion gap: 12 (ref 5–15)
BUN: 22 mg/dL — ABNORMAL HIGH (ref 6–20)
CO2: 24 mmol/L (ref 22–32)
Calcium: 8.6 mg/dL — ABNORMAL LOW (ref 8.9–10.3)
Chloride: 100 mmol/L — ABNORMAL LOW (ref 101–111)
Creatinine, Ser: 1.33 mg/dL — ABNORMAL HIGH (ref 0.61–1.24)
GFR calc Af Amer: 60 mL/min — ABNORMAL LOW (ref >60)
GFR calc non Af Amer: 51 mL/min — ABNORMAL LOW (ref >60)
Glucose, Bld: 104 mg/dL — ABNORMAL HIGH (ref 65–99)
Potassium: 3.6 mmol/L (ref 3.5–5.1)
Sodium: 136 mmol/L (ref 135–145)

## 2017-09-18 LAB — CBC
HCT: 25.6 % — ABNORMAL LOW (ref 39.0–52.0)
Hemoglobin: 8.2 g/dL — ABNORMAL LOW (ref 13.0–17.0)
MCH: 30.7 pg (ref 26.0–34.0)
MCHC: 32 g/dL (ref 30.0–36.0)
MCV: 95.9 fL (ref 78.0–100.0)
Platelets: 208 10*3/uL (ref 150–400)
RBC: 2.67 MIL/uL — ABNORMAL LOW (ref 4.22–5.81)
RDW: 13.8 % (ref 11.5–15.5)
WBC: 8.5 10*3/uL (ref 4.0–10.5)

## 2017-09-18 MED ORDER — FUROSEMIDE 10 MG/ML IJ SOLN
40.0000 mg | Freq: Once | INTRAMUSCULAR | Status: AC
  Administered 2017-09-18: 40 mg via INTRAVENOUS
  Filled 2017-09-18: qty 4

## 2017-09-18 MED ORDER — POTASSIUM CHLORIDE CRYS ER 20 MEQ PO TBCR
40.0000 meq | EXTENDED_RELEASE_TABLET | Freq: Every day | ORAL | Status: DC
  Administered 2017-09-18 – 2017-09-19 (×2): 40 meq via ORAL
  Filled 2017-09-18 (×2): qty 2

## 2017-09-18 MED ORDER — ALTEPLASE 2 MG IJ SOLR
2.0000 mg | Freq: Once | INTRAMUSCULAR | Status: AC
  Administered 2017-09-18: 2 mg

## 2017-09-18 NOTE — Progress Notes (Signed)
CARDIAC REHAB PHASE I   PRE:  Rate/Rhythm: 78 SR  BP:  Supine: 115/58  Sitting:   Standing:    SaO2: 94%RA  MODE:  Ambulation: 940 ft   POST:  Rate/Rhythm: 94 SR but two different episodes of atrial fib 134 and then 126  BP:  Supine: 126/66  Sitting:   Standing:    SaO2: 98%RA 1414-1500 Pt walked 940 ft with rollator( in case he needed to sit) with steady gait. Pt had two different episodes of atrial fib during walk. Once he went to 134 and then back to NSR at 94. Second time to 126 and pt became a little more SOB with this episode. Again converted back to NSR by end of walk. To bed with wife in room. Discussed CRP 2 and will refer to [place].   Luetta Nuttingharlene Clarabelle Oscarson, RN BSN  09/18/2017 2:53 PM

## 2017-09-18 NOTE — Discharge Instructions (Signed)
Aortic Valve Replacement, Care After °Refer to this sheet in the next few weeks. These instructions provide you with information about caring for yourself after your procedure. Your health care provider may also give you more specific instructions. Your treatment has been planned according to current medical practices, but problems sometimes occur. Call your health care provider if you have any problems or questions after your procedure. °What can I expect after the procedure? °After the procedure, it is common to have: °· Pain around your incision area. °· A small amount of blood or clear fluid coming from your incision. ° °Follow these instructions at home: °Eating and drinking ° °· Follow instructions from your health care provider about eating or drinking restrictions. °? Limit alcohol intake to no more than 1 drink per day for nonpregnant women and 2 drinks per day for men. One drink equals 12 oz of beer, 5 oz of wine, or 1½ oz of hard liquor. °? Limit how much caffeine you drink. Caffeine can affect your heart's rate and rhythm. °· Drink enough fluid to keep your urine clear or pale yellow. °· Eat a heart-healthy diet. This should include plenty of fresh fruits and vegetables. If you eat meat, it should be lean cuts. Avoid foods that are: °? High in salt, saturated fat, or sugar. °? Canned or highly processed. °? Fried. °Activity °· Return to your normal activities as told by your health care provider. Ask your health care provider what activities are safe for you. °· Exercise regularly once you have recovered, as told by your health care provider. °· Avoid sitting for more than 2 hours at a time without moving. Get up and move around at least once every 1-2 hours. This helps to prevent blood clots in the legs. °· Do not lift anything that is heavier than 10 lb (4.5 kg) until your health care provider approves. °· Avoid pushing or pulling things with your arms until your health care provider approves. This  includes pulling on handrails to help you climb stairs. °Incision care ° °· Follow instructions from your health care provider about how to take care of your incision. Make sure you: °? Wash your hands with soap and water before you change your bandage (dressing). If soap and water are not available, use hand sanitizer. °? Change your dressing as told by your health care provider. °? Leave stitches (sutures), skin glue, or adhesive strips in place. These skin closures may need to stay in place for 2 weeks or longer. If adhesive strip edges start to loosen and curl up, you may trim the loose edges. Do not remove adhesive strips completely unless your health care provider tells you to do that. °· Check your incision area every day for signs of infection. Check for: °? More redness, swelling, or pain. °? More fluid or blood. °? Warmth. °? Pus or a bad smell. °Medicines °· Take over-the-counter and prescription medicines only as told by your health care provider. °· If you were prescribed an antibiotic medicine, take it as told by your health care provider. Do not stop taking the antibiotic even if you start to feel better. °Travel °· Avoid airplane travel for as long as told by your health care provider. °· When you travel, bring a list of your medicines and a record of your medical history with you. Carry your medicines with you. °Driving °· Ask your health care provider when it is safe for you to drive. Do not drive until your health   care provider approves. °· Do not drive or operate heavy machinery while taking prescription pain medicine. °Lifestyle ° °· Do not use any tobacco products, such as cigarettes, chewing tobacco, or e-cigarettes. If you need help quitting, ask your health care provider. °· Resume sexual activity as told by your health care provider. Do not use medicines for erectile dysfunction unless your health care provider approves, if this applies. °· Work with your health care provider to keep your  blood pressure and cholesterol under control, and to manage any other heart conditions that you have. °· Maintain a healthy weight. °General instructions °· Do not take baths, swim, or use a hot tub until your health care provider approves. °· Do not strain to have a bowel movement. °· Avoid crossing your legs while sitting down. °· Check your temperature every day for a fever. A fever may be a sign of infection. °· If you are a woman and you plan to become pregnant, talk with your health care provider before you become pregnant. °· Wear compression stockings if your health care provider instructs you to do this. These stockings help to prevent blood clots and reduce swelling in your legs. °· Tell all health care providers who care for you that you have an artificial (prosthetic) aortic valve. If you have or have had heart disease or endocarditis, tell all health care providers about these conditions as well. °· Keep all follow-up visits as told by your health care provider. This is important. °Contact a health care provider if: °· You develop a skin rash. °· You experience sudden, unexplained changes in your weight. °· You have more redness, swelling, or pain around your incision. °· You have more fluid or blood coming from your incision. °· Your incision feels warm to the touch. °· You have pus or a bad smell coming from your incision. °· You have a fever. °Get help right away if: °· You develop chest pain that is different from the pain coming from your incision. °· You develop shortness of breath or difficulty breathing. °· You start to feel light-headed. °These symptoms may represent a serious problem that is an emergency. Do not wait to see if the symptoms will go away. Get medical help right away. Call your local emergency services (911 in the [place] Do not drive yourself to the hospital. °This information is not intended to replace advice given to you by your health care provider. Make sure you discuss any  questions you have with your health care provider. °Document Released: 02/08/2005 Document Revised: 12/29/2015 Document Reviewed: 06/26/2015 °Elsevier Interactive Patient Education © 2017 [employer] ° °

## 2017-09-18 NOTE — Discharge Summary (Signed)
Physician Discharge Summary  Patient ID: Leonard Padilla MRN: [id number] DOB/AGE: 12/15/1945 72 y.o.  Admit date: 09/09/2017 Discharge date: 09/19/2017  Admission Diagnoses:  Patient Active Problem List   Diagnosis Date Noted  . Atherosclerosis 08/19/2017  . Abnormal resting ECG findings 08/19/2017  . Systolic murmur 08/19/2017  . Diastolic dysfunction without heart failure 08/19/2017  . Chest pain on exertion 08/12/2017  . 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 Lower urinary tract symptoms (LUTS) 04/29/2017  . Encounter for monitoring statin therapy 04/24/2017  . Former smoker, stopped smoking in distant past   . Dysthymia 03/18/2017  . Irritability and anger 03/18/2017  . 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 Elevated blood pressure reading in office with diagnosis of hypertension 03/18/2017  . Moderate aortic stenosis by prior echocardiogram 03/18/2017  . Dyslipidemia, goal LDL below 70 03/18/2017  . 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 Postural dizziness with presyncope 08/27/2016  . CKD (chronic kidney disease) stage 3, GFR 30-59 ml/min (HCC) 08/04/2016  . Status post CVA 08/03/2016  . Status post carotid endarterectomy 12/15/2015  . Obstructive sleep apnea 11/29/2015  . GERD (gastroesophageal reflux disease) 10/13/2015  . Hypertension goal BP (blood pressure) < 140/80 10/13/2015  . Psoriasis 12/02/2014   Discharge Diagnoses:   Patient Active Problem List   Diagnosis Date Noted  . Urinary retention due to benign prostatic hyperplasia 09/13/2017  . S/P AVR 09/09/2017  . Atherosclerosis 08/19/2017  . Abnormal resting ECG findings 08/19/2017  . Systolic murmur 08/19/2017  . Diastolic dysfunction without heart failure 08/19/2017  . Chest pain on exertion 08/12/2017  . 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 Lower urinary tract symptoms (LUTS) 04/29/2017  . Encounter for monitoring statin therapy 04/24/2017  . Former smoker, stopped smoking in  distant past   . Dysthymia 03/18/2017  . Irritability and anger 03/18/2017  . 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 Elevated blood pressure reading in office with diagnosis of hypertension 03/18/2017  . Moderate aortic stenosis by prior echocardiogram 03/18/2017  . Dyslipidemia, goal LDL below 70 03/18/2017  . 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 Postural dizziness with presyncope 08/27/2016  . CKD (chronic kidney disease) stage 3, GFR 30-59 ml/min (HCC) 08/04/2016  . Status post CVA 08/03/2016  . Status post carotid endarterectomy 12/15/2015  . Obstructive sleep apnea 11/29/2015  . GERD (gastroesophageal reflux disease) 10/13/2015  . Hypertension goal BP (blood pressure) < 140/80 10/13/2015  . Psoriasis 12/02/2014   Discharged Condition: good  History of Present Illness:  [name] is a 72 yo white male who presented with a 6 month complaint of shortness of breath with exertion.  This is accompanied by chest tightness.  Over the past 3 months the patient noticed his symptoms had progressed.  He has known he has had a heart murmur since the age of 47.  He suffered a stroke in 2017 and underwent subsequent endarterectomy after that.  He was evaluated by Dr. [place] who performed Echocardiogram which showed severe aortic stenosis.  Cardiac catheterization was also performed and this showed evidence of multivessel CAD  It was felt the patient should undergo intervention on his Aortic valve and he was referred to [facility] for surgical evaluation.  He was evaluated by Dr. Tyrone Padilla who was in agreement the patient would require aortic valve replacement.  The risks and benefits of the procedure were explained to the patient and he was agreeable to proceed.  Dental clearance was obtained prior to proceeding with surgery.  Padilla Course:  [name] presented to [facility]illa on 09/09/2017.  He was taken to the operating room and underwent Aortic Valve Replacement with a 23 mm  Pericardial [employer] Tissue Valve, CABG x 4, and endoscopic harvest of greater saphenous vein from his right leg.Marland Kitchen  He tolerated the procedure without difficulty and was taken to the SICU in stable condition.  He was extubated the evening of surgery.  During his stay in the SICU the patient was weaned off Dopamine as tolerated.  He developed post operative blood loss anemia and was transfused 1 unit of packed cells due to weakness with standing.  The patients chest tubes and arterial lines were removed without difficulty.  The patients foley catheter was removed.  He was on his home regimen of Flomax.  He developed urinary retention and required placement of a new foley.  Urology consult was placed who recommended the patient be discharged home with foley catheter and attempt voiding trial on an outpatient basis.   The patient continued to have issues with dizziness, weakness, and near syncopal episodes.  He required transfusion of an additional unit of packed cells.  He was started on oral irons supplementation.  The patient developed brief episodes of Atrial Fibrillation.  He was unaware this was occurring.  He converted to NSR and was transitioned to an oral regimen. The patient suffered an episode of unresponsiveness.  His arms were shaking, his eyes rolled back, and he immediately became responsive.  Neurology consult was requested and EEG was obtained. It was felt this was likely a syncopal convulsion precipitated by transient decreased vascular tone.  His EEG was negative for seizure activity.  The patient made progress.  He continued to maintain NSR.  He was ambulating without difficulty.  He felt like he could void on his own and wished to have his foley catheter removed prior to discharge.  His catheter was removed on 09/17/2017 and he was transferred to the telemetry unit in stable condition. The patient suffered another vagal episode.  This occurred after an episode of coughing.  Telemetry  monitor was reviewed and no arrhythmia was present.  He was alert and oriented during event.  He was voiding without difficulty post foley removal.  He remained in NSR and his pacing wires were removed.  He was hypervolemic with lower extremity edema.  He was treated with IV Lasix for this with good response.  His Hgb level remains at 8.2, he will be discharged on iron supplementation for this.  He continues to ambulate without incisions.  His incisions are healing without evidence of infection.  He is medically stable for discharge home today.                  Consults: neurology  Significant Diagnostic Studies: cardiac graphics:   Echocardiogram:   Study Conclusions  - Left ventricle: The cavity size was normal. There was mild focal   basal hypertrophy of the septum. Systolic function was vigorous.   The estimated ejection fraction was in the range of 65% to 70%.   Wall motion was normal; there were no regional wall motion   abnormalities. Doppler parameters are consistent with abnormal   left ventricular relaxation (grade 1 diastolic dysfunction). - Aortic valve: Valve mobility was restricted. There was severe   stenosis. There was mild regurgitation. Peak velocity (S): 442   cm/s. Mean gradient (S): 50 mm Hg. Valve area (VTI): 0.82 cm^2.   Valve area (Vmax): 0.73 cm^2. Valve area (Vmean):  0.76 cm^2. - Mitral valve: There was mild regurgitation.  Treatments: surgery:   Aortic valve replacement with pericardial tissue valve Edwards Lifesciences 23 mm, model 3300TFX, serial #[id number], and coronary artery bypass grafting x4 with the left internal mammary to the left anterior descending coronary artery, reverse saphenous vein graft to the diagonal coronary artery, reverse saphenous vein graft to the obtuse marginal coronary artery, reverse saphenous vein graft to the posterior descending coronary artery with right greater saphenous thigh and calf endo vein harvesting.  Disposition: 01-Home  or Self Care   Discharge Medications:  The patient has been discharged on:   1.Beta Blocker:  Yes [ x  ]                              No   [   ]                              If No, reason:  2.Ace Inhibitor/ARB: Yes [   ]                                     No  [ x   ]                                     If No, reason: labile BP  3.Statin:   Yes [ x  ]                  No  [   ]                  If No, reason:  4.Ecasa:  Yes  [ x  ]                  No   [   ]                  If No, reason:     Discharge Instructions    Amb Referral to Cardiac Rehabilitation   Complete by:  As directed    Diagnosis:  Valve Replacement   Valve:  Aortic   Discharge patient   Complete by:  As directed    Discharge disposition:  01-Home or Self Care   Discharge patient date:  09/19/2017     Allergies as of 09/19/2017      Reactions   Viagra [sildenafil Citrate] Other (See Comments)   COLOR DISCRIMINATION [DOSE RELATED] "blue sensation"      Medication List    STOP taking these medications   candesartan 8 MG tablet Commonly known as:  ATACAND   hydrochlorothiazide 12.5 MG tablet Commonly known as:  HYDRODIURIL   mupirocin ointment 2 % Commonly known as:  BACTROBAN   nitroGLYCERIN 0.4 MG SL tablet Commonly known as:  NITROSTAT     TAKE these medications   amiodarone 200 MG tablet Commonly known as:  PACERONE Take 1 tablet (200 mg total) by mouth every 12 (twelve) hours. For 4 more days, then once daily   aspirin EC 81 MG tablet Take 1 tablet (81 mg total) by mouth daily.   atorvastatin 40 MG tablet Commonly known as:  LIPITOR TAKE 2 TABLETS(80 MG) BY MOUTH DAILY  What changed:  See the new instructions.   clobetasol cream 0.05 % Commonly known as:  TEMOVATE Apply 1 application topically daily.   clopidogrel 75 MG tablet Commonly known as:  PLAVIX Take 75 mg by mouth daily.   ferrous gluconate 324 MG tablet Commonly known as:  FERGON Take 1 tablet (324 mg  total) by mouth 2 (two) times daily with a meal.   metoprolol tartrate 25 MG tablet Commonly known as:  LOPRESSOR Take 0.5 tablets (12.5 mg total) by mouth 2 (two) times daily.   sertraline 25 MG tablet Commonly known as:  ZOLOFT 1 tab PO daily. NEEDS APPT FOR REFILLS What changed:    how much to take  how to take this  when to take this  additional instructions   tamsulosin 0.4 MG Caps capsule Commonly known as:  FLOMAX Take 1 capsule (0.4 mg total) by mouth daily. What changed:  how much to take   traMADol 50 MG tablet Commonly known as:  ULTRAM Take 1-2 tablets (50-100 mg total) by mouth every 6 (six) hours as needed for moderate pain.      Follow-up Information    Call Crista Elliot, MD.   Specialty:  Urology Why:  For an appointment in 1 week when you get home. Contact information: [address] [place] [address] [phone]        Delight Ovens, MD Follow up on 10/14/2017.   Specialty:  Cardiothoracic Surgery Why:  Appointment is 2:00, please get CXR at 1:30 at [facility] located on first floor of our office building Contact information: [address] [place] [address] [phone]        Revankar, Aundra Dubin, MD Follow up on 10/04/2017.   Specialty:  Cardiology Why:  Appointment is at 9:00 Contact information: [address] [place]  [place] [address] [phone]           Signed: Rowe Clack 09/19/2017, 3:34 PM

## 2017-09-18 NOTE — Progress Notes (Addendum)
[address]       [place] [id number]             [phone]      9 Days Post-Op Procedure(s) (LRB): AORTIC VALVE REPLACEMENT (AVR) using a 23 Edwards Perimount Magna Ease Aortic Valve (N/A) CORONARY ARTERY BYPASS GRAFTING (CABG) x 4 using the left internal mammary artery and right greater saphenous vein harvested endoscopically. (N/A) TRANSESOPHAGEAL ECHOCARDIOGRAM (TEE) (N/A)   Subjective:  [name] states he is feeling okay this morning.  He did have another vagal/near syncopal episode yesterday evening.  This was triggered after the patient laughed, which made him have a coughing spell.  Him and his wife are reasonably concerned this continues to occur and wonder what will happen if he is left alone.  Objective: Vital signs in last 24 hours: Temp:  [97.1 F (36.2 C)-98.7 F (37.1 C)] 97.7 F (36.5 C) (02/13 0455) Pulse Rate:  [74-103] 74 (02/13 0455) Cardiac Rhythm: Normal sinus rhythm;Bundle branch block (02/13 0455) Resp:  [17-30] 22 (02/13 0455) BP: (111-177)/(62-77) 111/65 (02/13 0455) SpO2:  [95 %-100 %] 100 % (02/13 0455) Weight:  [190 lb 8 oz (86.4 kg)] 190 lb 8 oz (86.4 kg) (02/13 0455)  Intake/Output from previous day: 02/12 0701 - 02/13 0700 In: 1080 [P.O.:1060; I.V.:20] Out: 1195 [Urine:1195]  General appearance: alert, cooperative and no distress Heart: regular rate and rhythm Lungs: clear to auscultation bilaterally Abdomen: soft, non-tender; bowel sounds normal; no masses,  no organomegaly Extremities: edema pitting edema 1-2+ Wound: clean and dry  Lab Results: Recent Labs    09/17/17 0525 09/18/17 0430  WBC 8.1 8.5  HGB 8.5* 8.2*  HCT 26.6* 25.6*  PLT 187 208   BMET:  Recent Labs    09/17/17 0525 09/18/17 0430  NA 137 136  K 3.9 3.6  CL 100* 100*  CO2 25 24  GLUCOSE 119* 104*  BUN 19 22*  CREATININE 1.31* 1.33*  CALCIUM 8.8* 8.6*    PT/INR: No results for input(s): LABPROT, INR in the last 72 hours. ABG   Component Value Date/Time   PHART 7.338 (L) 09/09/2017 2153   HCO3 21.3 09/09/2017 2153   TCO2 20 (L) 09/10/2017 1617   ACIDBASEDEF 4.0 (H) 09/09/2017 2153   O2SAT 99.0 09/09/2017 2153   CBG (last 3)  Recent Labs    09/17/17 1646 09/17/17 2154 09/18/17 0610  GLUCAP 120* 101* 121*    Assessment/Plan: S/P Procedure(s) (LRB): AORTIC VALVE REPLACEMENT (AVR) using a 23 Edwards Perimount Magna Ease Aortic Valve (N/A) CORONARY ARTERY BYPASS GRAFTING (CABG) x 4 using the left internal mammary artery and right greater saphenous vein harvested endoscopically. (N/A) TRANSESOPHAGEAL ECHOCARDIOGRAM (TEE) (N/A)  1. CV- NSR, BP controlled, continue Amiodarone, Lopressor... D/c EPW 2. Pulm- no acute issues, continue IS 3. Renal- creatinine stable, weight is elevated, will give IV lasix today for pitting edema, supplement K 4. Expected post operative blood loss anemia, Hgb at 8.2, continue iron supplementation 5. CBGs controlled, patient is not a diabetic, will d/c SSIP and CBG checks 6. Dispo- patient stable, continues to have near syncopal episodes which appear to happen when patient vagals, EEG was negative for seizures..  LOS: 9 days    Leonard Padilla 09/18/2017  Holding sinus, no pauses or heart block noted  Follow up echo before d/c home  I have seen and examined Leonard Padilla and agree with the above assessment  and plan.  Leonard OvensEdward B Brentley Horrell MD [facility] [phone] Office  [phone] 09/18/2017 5:59 PM

## 2017-09-19 ENCOUNTER — Inpatient Hospital Stay (HOSPITAL_COMMUNITY): Payer: Medicare Other

## 2017-09-19 DIAGNOSIS — I25110 Atherosclerotic heart disease of native coronary artery with unstable angina pectoris: Secondary | ICD-10-CM

## 2017-09-19 LAB — ECHOCARDIOGRAM COMPLETE
Height: 68.5 in
Weight: 3017.6 oz

## 2017-09-19 LAB — GLUCOSE, CAPILLARY
Glucose-Capillary: 113 mg/dL — ABNORMAL HIGH (ref 65–99)
Glucose-Capillary: 95 mg/dL (ref 65–99)

## 2017-09-19 MED ORDER — METOPROLOL TARTRATE 25 MG PO TABS: 12.5000 mg | ORAL_TABLET | Freq: Two times a day (BID) | ORAL | 1 refills | Status: DC

## 2017-09-19 MED ORDER — AMIODARONE HCL 200 MG PO TABS: 200.0000 mg | ORAL_TABLET | Freq: Two times a day (BID) | ORAL | 1 refills | Status: DC

## 2017-09-19 MED ORDER — TRAMADOL HCL 50 MG PO TABS: 50.0000 mg | ORAL_TABLET | Freq: Four times a day (QID) | ORAL | 0 refills | Status: DC | PRN

## 2017-09-19 MED ORDER — FERROUS GLUCONATE 324 (38 FE) MG PO TABS: 324.0000 mg | ORAL_TABLET | Freq: Two times a day (BID) | ORAL | 3 refills | Status: DC

## 2017-09-19 NOTE — Progress Notes (Signed)
  Echocardiogram 2D Echocardiogram has been performed.  Leonard Padilla  Leonard Padilla 09/19/2017, 10:00 AM

## 2017-09-19 NOTE — Progress Notes (Addendum)
      [address]       [place] [id number]             [phone]      10 Days Post-Op Procedure(s) (LRB): AORTIC VALVE REPLACEMENT (AVR) using a 23 Edwards Perimount Magna Ease Aortic Valve (N/A) CORONARY ARTERY BYPASS GRAFTING (CABG) x 4 using the left internal mammary artery and right greater saphenous vein harvested endoscopically. (N/A) TRANSESOPHAGEAL ECHOCARDIOGRAM (TEE) (N/A) Subjective: Feels good this morning. Eating his breakfast in the chair.   Objective: Vital signs in last 24 hours: Temp:  [98 F (36.7 C)-98.8 F (37.1 C)] 98.8 F (37.1 C) (02/14 0444) Pulse Rate:  [81-93] 81 (02/13 1615) Cardiac Rhythm: Normal sinus rhythm (02/14 0603) Resp:  [19-28] 20 (02/13 1615) BP: (96-129)/(53-77) 124/61 (02/14 0444) SpO2:  [97 %-100 %] 97 % (02/13 1615) Weight:  [188 lb 9.6 oz (85.5 kg)] 188 lb 9.6 oz (85.5 kg) (02/14 0224)     Intake/Output from previous day: 02/13 0701 - 02/14 0700 In: 240 [P.O.:240] Out: 1320 [Urine:1320] Intake/Output this shift: No intake/output data recorded.  General appearance: alert, cooperative and no distress Heart: regular rate and rhythm, S1, S2 normal, no murmur, click, rub or gallop Lungs: clear to auscultation bilaterally Abdomen: soft, non-tender; bowel sounds normal; no masses,  no organomegaly Extremities: extremities normal, atraumatic, no cyanosis or edema Wound: clean and dry  Lab Results: Recent Labs    09/17/17 0525 09/18/17 0430  WBC 8.1 8.5  HGB 8.5* 8.2*  HCT 26.6* 25.6*  PLT 187 208   BMET:  Recent Labs    09/17/17 0525 09/18/17 0430  NA 137 136  K 3.9 3.6  CL 100* 100*  CO2 25 24  GLUCOSE 119* 104*  BUN 19 22*  CREATININE 1.31* 1.33*  CALCIUM 8.8* 8.6*    PT/INR: No results for input(s): LABPROT, INR in the last 72 hours. ABG    Component Value Date/Time   PHART 7.338 (L) 09/09/2017 2153   HCO3 21.3 09/09/2017 2153   TCO2 20 (L) 09/10/2017 1617   ACIDBASEDEF 4.0 (H)  09/09/2017 2153   O2SAT 99.0 09/09/2017 2153   CBG (last 3)  Recent Labs    09/18/17 1619 09/18/17 2157 09/19/17 0549  GLUCAP 116* 103* 113*    Assessment/Plan: S/P Procedure(s) (LRB): AORTIC VALVE REPLACEMENT (AVR) using a 23 Edwards Perimount Magna Ease Aortic Valve (N/A) CORONARY ARTERY BYPASS GRAFTING (CABG) x 4 using the left internal mammary artery and right greater saphenous vein harvested endoscopically. (N/A) TRANSESOPHAGEAL ECHOCARDIOGRAM (TEE) (N/A)  1. CV-NSR rate in the 80s, BP stable. Continue Amio and Lopressor. EPW out. Vagal/near syncope yesterday. Repeat Echocardiogram.  2. Pulm-tolerating room air without issue. 3. Renal-creatinine 1.33, stable. Electrolytes okay. 4. H and H stable 5. Endo-blood glucose level well controlled on current regimen.   Plan: Follow-up Echo before d/c home. Ambulate.    LOS: 10 days    Sharlene Doryessa N Conte 09/19/2017 Feels well Holding sinus Echo done today reviewed Plan home today I have seen and examined Bertha StakesPaul M Ashbaugh and agree with the above assessment  and plan.  Delight OvensEdward B Uzoma Vivona MD Bee[facility] [phone] Office [phone] 09/19/2017 3:26 PM

## 2017-09-19 NOTE — Progress Notes (Signed)
CARDIAC REHAB PHASE I   PRE:  Rate/Rhythm: 78 SR    BP: sitting 97/80    SaO2: 97 RA  MODE:  Ambulation: 970 ft   POST:  Rate/Rhythm: 88 SR    BP: sitting 121/77     SaO2: 98 RA  Pt c/o pain in left chest from going to BR. Encouraged him to walk to watch for Afib. Pt initially used rollator but he was doing very well so last 200 ft walked without rollator. Steady. I watched telemetry entire walk and pt did not have any Afib (he had several minutes of afib yesterday while walking). Pt SOB with distance but overall did very well. To recliner. Ed completed with pt and wife. He is eager to go home. Does not need a RW but has one if he needs it at home. Gave pt instructions to sit down quickly if he does start laughing/coughing.  [id number]  Leonard MassonRandi Kristan Giselle Padilla CES, ACSM 09/19/2017 12:21 PM

## 2017-09-20 ENCOUNTER — Telehealth: Payer: Self-pay | Admitting: Cardiology

## 2017-09-20 NOTE — Telephone Encounter (Signed)
Patient has questions about medications from the hospital

## 2017-09-20 NOTE — Telephone Encounter (Signed)
Per Dr. Bing MatterKrasowski it is not imperative that the patient resume his plavix. Wife is understanding of this.

## 2017-09-23 ENCOUNTER — Telehealth (HOSPITAL_COMMUNITY): Payer: Self-pay

## 2017-09-23 ENCOUNTER — Ambulatory Visit: Payer: Medicare Other | Admitting: Cardiology

## 2017-09-23 NOTE — Telephone Encounter (Signed)
Patients insurance is active and benefits verified through [facility] - No co-pay, deductible amount of $200.00/$200.00 has been met, out of pocket amount of $1,000/$1,000 has been met, 20% co-insurance, and no pre-authorization is required. Passport/reference [phone]  Will contact patient in interest in CR - If patient is interested in program, patient will need to have follow up appt with the Cardiologist office. Once appt is completed patient will then be contacted for scheduling upon review by the RN Navigator.

## 2017-09-24 ENCOUNTER — Other Ambulatory Visit: Payer: Self-pay | Admitting: Cardiology

## 2017-09-24 ENCOUNTER — Encounter: Payer: Self-pay | Admitting: Cardiology

## 2017-09-24 ENCOUNTER — Telehealth: Payer: Self-pay | Admitting: Cardiology

## 2017-09-24 ENCOUNTER — Ambulatory Visit: Payer: Medicare Other | Admitting: Cardiology

## 2017-09-24 ENCOUNTER — Ambulatory Visit (HOSPITAL_BASED_OUTPATIENT_CLINIC_OR_DEPARTMENT_OTHER)
Admission: RE | Admit: 2017-09-24 | Discharge: 2017-09-24 | Disposition: A | Discharge status: Home / Self Care | Payer: Medicare Other | Source: Ambulatory Visit | Attending: Cardiology | Admitting: Cardiology

## 2017-09-24 VITALS — BP 126/72 | HR 81 | Ht 68.5 in | Wt 194.1 lb

## 2017-09-24 DIAGNOSIS — J9811 Atelectasis: Secondary | ICD-10-CM | POA: Diagnosis not present

## 2017-09-24 DIAGNOSIS — Z953 Presence of xenogenic heart valve: Secondary | ICD-10-CM | POA: Insufficient documentation

## 2017-09-24 DIAGNOSIS — E785 Hyperlipidemia, unspecified: Secondary | ICD-10-CM | POA: Diagnosis not present

## 2017-09-24 DIAGNOSIS — I10 Essential (primary) hypertension: Secondary | ICD-10-CM

## 2017-09-24 DIAGNOSIS — R600 Localized edema: Secondary | ICD-10-CM | POA: Diagnosis not present

## 2017-09-24 DIAGNOSIS — J90 Pleural effusion, not elsewhere classified: Secondary | ICD-10-CM | POA: Insufficient documentation

## 2017-09-24 DIAGNOSIS — R0602 Shortness of breath: Secondary | ICD-10-CM | POA: Diagnosis present

## 2017-09-24 DIAGNOSIS — Z952 Presence of prosthetic heart valve: Secondary | ICD-10-CM | POA: Diagnosis not present

## 2017-09-24 HISTORY — DX: Localized edema: R60.0

## 2017-09-24 MED ORDER — AMIODARONE HCL 100 MG PO TABS: 100.0000 mg | ORAL_TABLET | Freq: Every day | ORAL | 1 refills | Status: DC

## 2017-09-24 NOTE — Progress Notes (Signed)
Cardiology Office Note:    Date:  09/24/2017   ID:  [name], DOB Jan 29, 1946, MRN [id number]  PCP:  Carlis Stable, PA-C  Cardiologist:  Garwin Brothers, MD   Referring MD: Donzetta Kohut*    ASSESSMENT:    1. 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 Shortness of breath   3. Bilateral leg edema    PLAN:    In order of problems listed above:  1. Hospital evaluation and treatment was discussed at extensive length. 2. His chest evaluation suggest the possibility of pleural effusion and therefore I would do a chest x-ray with 2 views.  AP and lateral. 3. We will obtain complete blood work including chemistries and CBC.  I would like to know what his hemoglobin is doing at this time. 4. He has a right swelling greater than left on the lower extremities and we will obtain a DVT study. 5. In view of shortness of breath symptoms I will reduce his amiodarone to 100 mg daily. 6. Patient will be seen in follow-up appointment in 1 week or earlier if the patient has any concerns    Medication Adjustments/Labs and Tests Ordered: Current medicines are reviewed at length with the patient today.  Concerns regarding medicines are outlined above.  Orders Placed This Encounter  Procedures  . DG Chest 2 View  . Basic metabolic panel  . CBC with Differential/Platelet  . Hepatic function panel  . Pro b natriuretic peptide (BNP)   Meds ordered this encounter  Medications  . amiodarone (PACERONE) 100 MG tablet    Sig: Take 1 tablet (100 mg total) by mouth daily.    Dispense:  90 tablet    Refill:  1     Chief Complaint  Patient presents with  . Weight Gain     History of Present Illness:    Leonard Padilla is a 72 y.o. male.  The patient was evaluated by me for anginal symptoms and underwent coronary angiography and treatment revealed triple-vessel disease and severe aortic stenosis.  He underwent coronary artery bypass  grafting surgery and aortic valve replacement with a bioprosthetic valve.  Patient mentions to me that he is doing better at this time.  However his wife mentions to me that he has gained some weight and also has mild shortness of breath which is not very new considering the surgery.  No orthopnea or PND.  At the time of my evaluation he is alert awake oriented and in no distress.  He appears pale.  Past Medical History:  Diagnosis Date  . Aortic stenosis    moderate AS 12/01/15 echo (Dr. Elberta Fortis)  . Arthritis   . CKD (chronic kidney disease) stage 3, GFR 30-59 ml/min (HCC) 08/04/2016  . CKD stage G3a/A1, GFR 45-59 and albumin creatinine ratio <30 mg/g (HCC) 08/04/2016  . Coronary artery disease   . Dysthymia 03/18/2017  . 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 Essential hypertension 10/13/2015  . Flu 09/2015   influenza A  . Former smoker, stopped smoking in distant past   . GERD (gastroesophageal reflux disease)    occ  . Heart murmur   . History of colon polyps   . Hyperlipidemia   . 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 Pneumonia 09/2015  . Postural dizziness with presyncope 08/27/2016  . Psoriasis   . Sleep apnea    to pick cpap tomorrow 12/13/15  .  Stroke (HCC) 09/2015  . Symptomatic carotid artery stenosis 12/15/2015  . 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 Wears glasses     Past Surgical History:  Procedure Laterality Date  . AORTIC VALVE REPLACEMENT N/A 09/09/2017   Procedure: AORTIC VALVE REPLACEMENT (AVR) using a 23 Edwards Perimount Magna Ease Aortic Valve;  Surgeon: Delight OvensGerhardt, Edward B, MD;  Location: [facility] OR;  Service: Open Heart Surgery;  Laterality: N/A;  . COLONOSCOPY W/ BIOPSIES AND POLYPECTOMY    . CORONARY ARTERY BYPASS GRAFT N/A 09/09/2017   Procedure: CORONARY ARTERY BYPASS GRAFTING (CABG) x 4 using the left internal mammary artery and right greater saphenous vein harvested endoscopically.;  Surgeon: Delight OvensGerhardt, Edward B, MD;   [facility];  Service: Open Heart Surgery;  Laterality: N/A;  . ENDARTERECTOMY Right 12/15/2015   Procedure: RIGHT CAROTID ENDARTERECTOMY WITH PATCH ANGIOPLASTY;  Surgeon: Nada LibmanVance W Brabham, MD;  Location: [facility] OR;  Service: Vascular;  Laterality: Right;  . PATCH ANGIOPLASTY Right 12/15/2015   Procedure: RIGHT CAROTID PATCH ANGIOPLASTY;  Surgeon: Nada LibmanVance W Brabham, MD;  [facility];  Service: Vascular;  Laterality: Right;  . RIGHT/LEFT HEART CATH AND CORONARY ANGIOGRAPHY N/A 08/23/2017   Procedure: RIGHT/LEFT HEART CATH AND CORONARY ANGIOGRAPHY;  Surgeon: SwazilandJordan, Peter M, MD;  [facility];  Service: Cardiovascular;  Laterality: N/A;  . TEE WITHOUT CARDIOVERSION N/A 09/09/2017   Procedure: TRANSESOPHAGEAL ECHOCARDIOGRAM (TEE);  Surgeon: Delight OvensGerhardt, Edward B, MD;  [facility];  Service: Open Heart Surgery;  Laterality: N/A;  . WISDOM TOOTH EXTRACTION      Current Medications: Current Meds  Medication Sig  . aspirin EC 81 MG tablet Take 1 tablet (81 mg total) by mouth daily.  Marland Kitchen. atorvastatin (LIPITOR) 40 MG tablet TAKE 2 TABLETS(80 MG) BY MOUTH DAILY (Patient taking differently: TAKE 2 TABLETS(80 MG) BY MOUTH AT BEDTIME)  . clobetasol cream (TEMOVATE) 0.05 % Apply 1 application topically daily.  . ferrous gluconate (FERGON) 324 MG tablet Take 1 tablet (324 mg total) by mouth 2 (two) times daily with a meal.  . metoprolol tartrate (LOPRESSOR) 25 MG tablet Take 0.5 tablets (12.5 mg total) by mouth 2 (two) times daily.  . sertraline (ZOLOFT) 25 MG tablet 1 tab PO daily. NEEDS APPT FOR REFILLS (Patient taking differently: Take 25 mg by mouth daily. 1 tab PO daily. NEEDS APPT FOR REFILLS)  . tamsulosin (FLOMAX) 0.4 MG CAPS capsule Take 1 capsule (0.4 mg total) by mouth daily. (Patient taking differently: Take 415 mg by mouth daily. )  . traMADol (ULTRAM) 50 MG tablet Take 1-2 tablets (50-100 mg total) by mouth every 6 (six) hours as needed for moderate pain.  . [DISCONTINUED] amiodarone  (PACERONE) 200 MG tablet Take 1 tablet (200 mg total) by mouth every 12 (twelve) hours. For 4 more days, then once daily     Allergies:   Viagra [sildenafil citrate]   Social History   Socioeconomic History  . Marital status: Married    Spouse name: [name]  . Number of children: 2  . Years of education: 8813  . Highest education level: None  Social Needs  . Financial resource strain: None  . Food insecurity - worry: None  . Food insecurity - inability: None  . Transportation needs - medical: None  . Transportation needs - non-medical: None  Occupational History  . 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 Smoking status: Former Smoker    Last attempt to quit: 08/07/1983  Years since quitting: 34.1  . Smokeless tobacco: Never Used  Substance and Sexual Activity  . Alcohol use: Yes    Comment: 1 glass of wine daily   . Drug use: No  . Sexual activity: Not Currently  Other Topics Concern  . None  Social History Narrative   Lives with wife   Caffeine use- coffee 3-4 cups daily     Family History: The patient's family history includes Cancer in his father and mother.  ROS:   Please see the history of present illness.    All other systems reviewed and are negative.  EKGs/Labs/Other Studies Reviewed:    The following studies were reviewed today: I reviewed hospital records extensively and blood work 2.  She is blood work revealed significant anemia and he has received 2 units of packed RBCs.  I also checked his oxygen saturation and it is greater than 90% at rest.   Recent Labs: 09/04/2017: ALT 25 09/15/2017: TSH 2.891 09/16/2017: Magnesium 1.8 09/18/2017: BUN 22; Creatinine, Ser 1.33; Hemoglobin 8.2; Platelets 208; Potassium 3.6; Sodium 136  Recent Lipid Panel    Component Value Date/Time   CHOL 190 04/24/2017 1156   TRIG 140 04/24/2017 1156   HDL 69 04/24/2017 1156   CHOLHDL 2.8 04/24/2017 1156   VLDL 23 08/03/2016 1115    LDLCALC 115 (H) 08/03/2016 1115    Physical Exam:    VS:  BP 126/72 (BP Location: Left Arm, Patient Position: Sitting, Cuff Size: Normal)   Pulse 81   Ht 5' 8.5" (1.74 m)   Wt 194 lb 1.9 oz (88.1 kg)   SpO2 96%   BMI 29.09 kg/m     Wt Readings from Last 3 Encounters:  09/24/17 194 lb 1.9 oz (88.1 kg)  09/19/17 188 lb 9.6 oz (85.5 kg)  09/04/17 182 lb 9.6 oz (82.8 kg)     GEN: Patient is in no acute distress HEENT: Normal NECK: No JVD; No carotid bruits LYMPHATICS: No lymphadenopathy CARDIAC: Hear sounds regular, 2/6 systolic murmur at the apex.  The scars of surgery are healing well RESPIRATORY:  Clear to auscultation without rales, wheezing or rhonchi.  Reduced air entry at both bases. ABDOMEN: Soft, non-tender, non-distended MUSCULOSKELETAL:   No deformity.  Graft harvest site is well.  Bilateral pedal edema right greater than left. SKIN: Warm and dry NEUROLOGIC:  Alert and oriented x 3 PSYCHIATRIC:  Normal affect   Signed, Garwin Brothers, MD  09/24/2017 3:20 PM    [facility]

## 2017-09-24 NOTE — Addendum Note (Signed)
Addended by: Carren RangGIPSON, Lois Ostrom on: 09/24/2017 03:32 PM   Modules accepted: Orders

## 2017-09-24 NOTE — Telephone Encounter (Signed)
Patient's wife stated that the patient had been feeling fatigued and bloated. The patient has gained 5 pounds over a 5 day time span. Per Dr. Tomie Chinaevankar the patient is to be seen today.

## 2017-09-24 NOTE — Telephone Encounter (Signed)
Thinks he may be retaining a little more fluid than he should be

## 2017-09-24 NOTE — Patient Instructions (Signed)
Medication Instructions:  Your physician has recommended you make the following change in your medication:  CHANGE amiodarone to 100 mg daily  Labwork: Your physician recommends that you have the following labs drawn: BNP, BMP, CBC, and liver panel  Testing/Procedures: A chest x-ray takes a picture of the organs and structures inside the chest, including the heart, lungs, and blood vessels. This test can show several things, including, whether the heart is enlarges; whether fluid is building up in the lungs; and whether pacemaker / defibrillator leads are still in place.  Lower extremity DVT study  Follow-Up: Your physician recommends that you schedule a follow-up appointment in: 1 week  Any Other Special Instructions Will Be Listed Below (If Applicable).     If you need a refill on your cardiac medications before your next appointment, please call your pharmacy.   [facility]  Garey HamAshley A, RN, BSN

## 2017-09-25 ENCOUNTER — Telehealth: Payer: Self-pay

## 2017-09-25 DIAGNOSIS — R9389 Abnormal findings on diagnostic imaging of other specified body structures: Secondary | ICD-10-CM

## 2017-09-25 LAB — CBC WITH DIFFERENTIAL/PLATELET
Basophils Absolute: 0 10*3/uL (ref 0.0–0.2)
Basos: 0 %
EOS (ABSOLUTE): 0.1 10*3/uL (ref 0.0–0.4)
Eos: 1 %
Hematocrit: 28.3 % — ABNORMAL LOW (ref 37.5–51.0)
Hemoglobin: 9.2 g/dL — ABNORMAL LOW (ref 13.0–17.7)
Immature Grans (Abs): 0.1 10*3/uL (ref 0.0–0.1)
Immature Granulocytes: 1 %
Lymphocytes Absolute: 0.9 10*3/uL (ref 0.7–3.1)
Lymphs: 9 %
MCH: 30 pg (ref 26.6–33.0)
MCHC: 32.5 g/dL (ref 31.5–35.7)
MCV: 92 fL (ref 79–97)
Monocytes Absolute: 1.1 10*3/uL — ABNORMAL HIGH (ref 0.1–0.9)
Monocytes: 11 %
Neutrophils Absolute: 8 10*3/uL — ABNORMAL HIGH (ref 1.4–7.0)
Neutrophils: 78 %
Platelets: 452 10*3/uL — ABNORMAL HIGH (ref 150–379)
RBC: 3.07 x10E6/uL — ABNORMAL LOW (ref 4.14–5.80)
RDW: 13.8 % (ref 12.3–15.4)
WBC: 10.2 10*3/uL (ref 3.4–10.8)

## 2017-09-25 LAB — HEPATIC FUNCTION PANEL
ALT: 41 IU/L (ref 0–44)
AST: 19 IU/L (ref 0–40)
Albumin: 3.7 g/dL (ref 3.5–4.8)
Alkaline Phosphatase: 134 IU/L — ABNORMAL HIGH (ref 39–117)
Bilirubin Total: 0.5 mg/dL (ref 0.0–1.2)
Bilirubin, Direct: 0.22 mg/dL (ref 0.00–0.40)
Total Protein: 6.5 g/dL (ref 6.0–8.5)

## 2017-09-25 LAB — BASIC METABOLIC PANEL
BUN/Creatinine Ratio: 16 (ref 10–24)
BUN: 20 mg/dL (ref 8–27)
CO2: 21 mmol/L (ref 20–29)
Calcium: 8.9 mg/dL (ref 8.6–10.2)
Chloride: 102 mmol/L (ref 96–106)
Creatinine, Ser: 1.29 mg/dL — ABNORMAL HIGH (ref 0.76–1.27)
GFR calc Af Amer: 63 mL/min/{1.73_m2} (ref >59)
GFR calc non Af Amer: 55 mL/min/{1.73_m2} — ABNORMAL LOW (ref >59)
Glucose: 99 mg/dL (ref 65–99)
Potassium: 4.5 mmol/L (ref 3.5–5.2)
Sodium: 140 mmol/L (ref 134–144)

## 2017-09-25 LAB — PRO B NATRIURETIC PEPTIDE: NT-Pro BNP: 1962 pg/mL — ABNORMAL HIGH (ref 0–376)

## 2017-09-25 NOTE — Telephone Encounter (Signed)
Patient informed of results of chest x-ray. Advised patient urgent referral to cardiothoracic surgery per Dr. Tomie Chinaevankar for thoracentesis. Advised patient if he has not received a phone call by Friday after lunch to call our office. Patient verbalized understanding. No further questions.

## 2017-09-25 NOTE — Telephone Encounter (Signed)
-----   Message from Garwin Brothersajan R Revankar, MD sent at 09/25/2017 10:38 AM EST ----- It all makes sense now while he is short of breath.  He has significant moderate left pleural effusion.  Please get in touch with cardiothoracic surgery as soon as possible so he can be evaluated by them for the possibility of tapping this fluid so he will feel better.  Please reply to this message so I know you are working on it.  Have a wonderful day. Garwin Brothersajan R Revankar, MD 09/25/2017 10:37 AM

## 2017-09-27 ENCOUNTER — Telehealth: Payer: Self-pay

## 2017-09-27 ENCOUNTER — Other Ambulatory Visit: Payer: Self-pay | Admitting: *Deleted

## 2017-09-27 ENCOUNTER — Other Ambulatory Visit: Payer: Self-pay | Admitting: Physician Assistant

## 2017-09-27 ENCOUNTER — Telehealth: Payer: Self-pay | Admitting: *Deleted

## 2017-09-27 ENCOUNTER — Telehealth: Payer: Self-pay | Admitting: Cardiology

## 2017-09-27 ENCOUNTER — Ambulatory Visit (HOSPITAL_BASED_OUTPATIENT_CLINIC_OR_DEPARTMENT_OTHER)
Admission: RE | Admit: 2017-09-27 | Discharge: 2017-09-27 | Disposition: A | Discharge status: Home / Self Care | Payer: Medicare Other | Source: Ambulatory Visit | Attending: Cardiology | Admitting: Cardiology

## 2017-09-27 DIAGNOSIS — R600 Localized edema: Secondary | ICD-10-CM | POA: Diagnosis not present

## 2017-09-27 DIAGNOSIS — J90 Pleural effusion, not elsewhere classified: Secondary | ICD-10-CM

## 2017-09-27 MED ORDER — POTASSIUM CHLORIDE ER 20 MEQ PO TBCR: 20.0000 meq | EXTENDED_RELEASE_TABLET | Freq: Every day | ORAL | 0 refills | Status: DC

## 2017-09-27 MED ORDER — FUROSEMIDE 40 MG PO TABS: 40.0000 mg | ORAL_TABLET | Freq: Every day | ORAL | 0 refills | Status: DC

## 2017-09-27 NOTE — Progress Notes (Signed)
Spoke with Alycia RossettiRyan in regards to patients moderate left pleural effusion.  The patient needs a Thoracentesis.  This can be done outpatient if can be arranged for today.  I also prescribed the patient Lasix 40 mg daily and potassium 20 meq daily.  If this is unable to be done today, the patient would need to report to the ED if he is severely short of breath.  Korbin Notaro PA-C

## 2017-09-27 NOTE — Telephone Encounter (Signed)
The patient called today stating that he had yet to hear anything from Dr. Dennie MaizesGerhardt's office and was concerned as it is nearing the end of the week. Nurse Alycia Rossettiyan stated that patient was scheduled for 02/27 at 15:30 and that they had moved patient's to accommodate Mr. Corky DownsOlsen coming in sooner. Dr. Tomie Chinaevankar stressed to Alycia RossettiRyan that this patient has a moderate pleural effusion that needed to be addressed asap as this patient is post-operative, vulnerable to infection, and symptomatic. The office agreed to do their best to assist this patient. It was requested that Dr. Tomie Chinaevankar speak with Dr. Tyrone SageGerhardt; however, he was unavailable due to being in surgery at the time.

## 2017-09-27 NOTE — Telephone Encounter (Signed)
Patients wife states they have not heard from pulmonologist as of today.

## 2017-09-27 NOTE — Progress Notes (Signed)
   Lower extremity venous duplex. Negative for DVT. Leonard Padilla, RDCS  Ura Yingling, Genene ChurnJames M 09/27/2017, 9:54 AM

## 2017-09-27 NOTE — Telephone Encounter (Signed)
Dr. Tomie Chinaevankar spoke with this patient after speaking with Dr. Dennie MaizesGerhardt's office. Patient was informed that the CTS office should be contacting him ASAP. Dr. Tomie Chinaevankar also ensured the patient that his care was the upmost priority and that if anything else was needed he could feel free to contact our office. An apology was also given for the delay of care.

## 2017-09-27 NOTE — Telephone Encounter (Signed)
[facility] Tom[facility] Chinaevankar about this patient needing an urgent referral to Dr. Tyrone SageGerhardt for new moderate effusion.  Appt was already made for Wednesday 2/27 with another CXR which patient and his wife were aware about.  I just spoke with the patient and he acknowledged that he knew about this appt but he just wanted to make sure Dr. Tyrone SageGerhardt knew this was more about his pleural effusion, which we did know that so I explained that to the patient so we are all on the same page.  Since Dr. Tyrone SageGerhardt is not here this week and his partners are all in surgery right now, I had a PA review his CXR which was done 2/19 and she ordered Lasix 40mg  daily as well and potassium 20meq daily.  We also ordered a left  thoracentesis to be done.  Neither [facility] or [facility] had any openings today so this will be done Monday 2/25am.  Patient is aware of all this information and was very appreciative.  I explained to him if he gets more SOB over the weekend to go to the ER.  He understands and has no further questions.

## 2017-09-30 ENCOUNTER — Ambulatory Visit (HOSPITAL_COMMUNITY)
Admission: RE | Admit: 2017-09-30 | Discharge: 2017-09-30 | Disposition: A | Discharge status: Home / Self Care | Payer: Medicare Other | Source: Ambulatory Visit | Attending: Cardiothoracic Surgery | Admitting: Cardiothoracic Surgery

## 2017-09-30 ENCOUNTER — Ambulatory Visit (HOSPITAL_COMMUNITY)
Admission: RE | Admit: 2017-09-30 | Discharge: 2017-09-30 | Disposition: A | Discharge status: Home / Self Care | Payer: Medicare Other | Source: Ambulatory Visit | Attending: Radiology | Admitting: Radiology

## 2017-09-30 DIAGNOSIS — Z951 Presence of aortocoronary bypass graft: Secondary | ICD-10-CM | POA: Insufficient documentation

## 2017-09-30 DIAGNOSIS — Z9889 Other specified postprocedural states: Secondary | ICD-10-CM | POA: Insufficient documentation

## 2017-09-30 DIAGNOSIS — Z952 Presence of prosthetic heart valve: Secondary | ICD-10-CM | POA: Diagnosis not present

## 2017-09-30 DIAGNOSIS — J90 Pleural effusion, not elsewhere classified: Secondary | ICD-10-CM | POA: Diagnosis present

## 2017-09-30 MED ORDER — LIDOCAINE HCL 1 % IJ SOLN
INTRAMUSCULAR | Status: AC
  Filled 2017-09-30: qty 10

## 2017-09-30 NOTE — Procedures (Signed)
Ultrasound-guided therapeutic left thoracentesis performed yielding 1.4 liters of hazy, amber fluid. No immediate complications. Follow-up chest x-ray pending.

## 2017-10-01 ENCOUNTER — Other Ambulatory Visit: Payer: Self-pay | Admitting: Cardiothoracic Surgery

## 2017-10-01 ENCOUNTER — Ambulatory Visit: Payer: Medicare Other | Admitting: Cardiology

## 2017-10-01 ENCOUNTER — Encounter: Payer: Self-pay | Admitting: Cardiology

## 2017-10-01 VITALS — BP 122/70 | HR 86 | Ht 68.5 in | Wt 172.0 lb

## 2017-10-01 DIAGNOSIS — Z9889 Other specified postprocedural states: Secondary | ICD-10-CM | POA: Diagnosis not present

## 2017-10-01 DIAGNOSIS — E785 Hyperlipidemia, unspecified: Secondary | ICD-10-CM | POA: Diagnosis not present

## 2017-10-01 DIAGNOSIS — I10 Essential (primary) hypertension: Secondary | ICD-10-CM | POA: Diagnosis not present

## 2017-10-01 DIAGNOSIS — Z952 Presence of prosthetic heart valve: Secondary | ICD-10-CM

## 2017-10-01 NOTE — Patient Instructions (Signed)
Medication Instructions:  Your physician recommends that you continue on your current medications as directed. Please refer to the Current Medication list given to you today.  Labwork: None  Testing/Procedures: None  Follow-Up: Your physician recommends that you schedule a follow-up appointment in: 3 months  Any Other Special Instructions Will Be Listed Below (If Applicable).     If you need a refill on your cardiac medications before your next appointment, please call your pharmacy.   [facility]  Sherrine Salberg A, RN, BSN  

## 2017-10-01 NOTE — Progress Notes (Signed)
Cardiology Office Note:    Date:  10/01/2017   ID:  Leonard Padilla, Leonard Padilla 1945/10/12, MRN [id number]  PCP:  Carlis Stable, PA-C  Cardiologist:  Garwin Brothers, MD   Referring MD: Donzetta Kohut*    ASSESSMENT:    1. Hypertension goal BP (blood pressure) < 140/80   2. Dyslipidemia, goal LDL below 70   3. S/P AVR   4. Status post carotid endarterectomy    PLAN:    In order of problems listed above:  1. Secondary prevention stressed with the patient.  Importance of compliance with diet and medications stressed and he vocalized understanding. 2. His symptom profile is much better post pleural tap and he is happy about it. 3. His blood pressure is stable.  He is to see his primary care physicians in the next month or 2 his complete blood work including lipid profile there. 4. Patient will be seen in follow-up appointment in 3 months or earlier if the patient has any concerns 5. His cardiac surgeon about discontinuing amiodarone and the timeframe for this.   Medication Adjustments/Labs and Tests Ordered: Current medicines are reviewed at length with the patient today.  Concerns regarding medicines are outlined above.  No orders of the defined types were placed in this encounter.  No orders of the defined types were placed in this encounter.    Chief Complaint  Patient presents with  . Follow-up     History of Present Illness:    Leonard Padilla is a 72 y.o. male.  Patient has known coronary artery disease and significant aortic stenosis for which he underwent cardiac surgery.  The patient has done well subsequently.  He is admitted the day and I evaluated him for significant pleural effusion.  He underwent pleural tap yesterday with the drainage of 1.4 L.  Subsequently he feels much better and is happy about it..  He has an appointment with his cardiac surgeon tomorrow.  At the time of my evaluation, the patient is alert awake oriented and in no  distress.  Past Medical History:  Diagnosis Date  . Aortic stenosis    moderate AS 12/01/15 echo (Dr. Elberta Fortis)  . Arthritis   . CKD (chronic kidney disease) stage 3, GFR 30-59 ml/min (HCC) 08/04/2016  . CKD stage G3a/A1, GFR 45-59 and albumin creatinine ratio <30 mg/g (HCC) 08/04/2016  . Coronary artery disease   . Dysthymia 03/18/2017  . 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 Essential hypertension 10/13/2015  . Flu 09/2015   influenza A  . Former smoker, stopped smoking in distant past   . GERD (gastroesophageal reflux disease)    occ  . Heart murmur   . History of colon polyps   . Hyperlipidemia   . 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 Pneumonia 09/2015  . Postural dizziness with presyncope 08/27/2016  . Psoriasis   . Sleep apnea    to pick cpap tomorrow 12/13/15  . Stroke (HCC) 09/2015  . Symptomatic carotid artery stenosis 12/15/2015  . 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 Wears glasses     Past Surgical History:  Procedure Laterality Date  . AORTIC VALVE REPLACEMENT N/A 09/09/2017   Procedure: AORTIC VALVE REPLACEMENT (AVR) using a 23 Edwards Perimount Magna Ease Aortic Valve;  Surgeon: Delight Ovens, MD;  Location: [facility] OR;  Service: Open Heart Surgery;  Laterality: N/A;  . COLONOSCOPY W/ BIOPSIES AND POLYPECTOMY    . CORONARY ARTERY  BYPASS GRAFT N/A 09/09/2017   Procedure: CORONARY ARTERY BYPASS GRAFTING (CABG) x 4 using the left internal mammary artery and right greater saphenous vein harvested endoscopically.;  Surgeon: Delight OvensGerhardt, Edward B, MD;  [facility];  Service: Open Heart Surgery;  Laterality: N/A;  . ENDARTERECTOMY Right 12/15/2015   Procedure: RIGHT CAROTID ENDARTERECTOMY WITH PATCH ANGIOPLASTY;  Surgeon: Nada LibmanVance W Brabham, MD;  Location: [facility] OR;  Service: Vascular;  Laterality: Right;  . PATCH ANGIOPLASTY Right 12/15/2015   Procedure: RIGHT CAROTID PATCH ANGIOPLASTY;  Surgeon: Nada LibmanVance W Brabham, MD;  [facility];   Service: Vascular;  Laterality: Right;  . RIGHT/LEFT HEART CATH AND CORONARY ANGIOGRAPHY N/A 08/23/2017   Procedure: RIGHT/LEFT HEART CATH AND CORONARY ANGIOGRAPHY;  Surgeon: SwazilandJordan, Peter M, MD;  [facility];  Service: Cardiovascular;  Laterality: N/A;  . TEE WITHOUT CARDIOVERSION N/A 09/09/2017   Procedure: TRANSESOPHAGEAL ECHOCARDIOGRAM (TEE);  Surgeon: Delight OvensGerhardt, Edward B, MD;  [facility];  Service: Open Heart Surgery;  Laterality: N/A;  . WISDOM TOOTH EXTRACTION      Current Medications: No outpatient medications have been marked as taking for the 10/01/17 encounter (Office Visit) with Ruthene Methvin, Aundra Dubinajan R, MD.     Allergies:   Viagra [sildenafil citrate]   Social History   Socioeconomic History  . Marital status: Married    Spouse name: [name]  . Number of children: 2  . Years of education: 6513  . Highest education level: None  Social Needs  . Financial resource strain: None  . Food insecurity - worry: None  . Food insecurity - inability: None  . Transportation needs - medical: None  . Transportation needs - non-medical: None  Occupational History  . 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 Smoking status: Former Smoker    Last attempt to quit: 08/07/1983    Years since quitting: 34.1  . Smokeless tobacco: Never Used  Substance and Sexual Activity  . Alcohol use: Yes    Comment: 1 glass of wine daily   . Drug use: No  . Sexual activity: Not Currently  Other Topics Concern  . None  Social History Narrative   Lives with wife   Caffeine use- coffee 3-4 cups daily     Family History: The patient's family history includes Cancer in his father and mother.  ROS:   Please see the history of present illness.    All other systems reviewed and are negative.  EKGs/Labs/Other Studies Reviewed:    The following studies were reviewed today: I reviewed the findings of his blood work with the patient.   Recent  Labs: 09/15/2017: TSH 2.891 09/16/2017: Magnesium 1.8 09/24/2017: ALT 41; BUN 20; Creatinine, Ser 1.29; Hemoglobin 9.2; NT-Pro BNP 1,962; Platelets 452; Potassium 4.5; Sodium 140  Recent Lipid Panel    Component Value Date/Time   CHOL 190 04/24/2017 1156   TRIG 140 04/24/2017 1156   HDL 69 04/24/2017 1156   CHOLHDL 2.8 04/24/2017 1156   VLDL 23 08/03/2016 1115   LDLCALC 115 (H) 08/03/2016 1115    Physical Exam:    VS:  BP 122/70 (BP Location: Left Arm, Patient Position: Sitting, Cuff Size: Normal)   Pulse 86   Ht 5' 8.5" (1.74 m)   Wt 172 lb (78 kg)   SpO2 98%   BMI 25.77 kg/m     Wt Readings from Last 3 Encounters:  10/01/17 172 lb (78 kg)  09/24/17 194 lb 1.9 oz (88.1 kg)  09/19/17 188 lb 9.6 oz (85.5 kg)     GEN: Patient is in no acute distress HEENT: Normal NECK: No JVD; No carotid bruits LYMPHATICS: No lymphadenopathy CARDIAC: Hear sounds regular, 2/6 systolic murmur at the apex. RESPIRATORY:  Clear to auscultation without rales, wheezing or rhonchi  ABDOMEN: Soft, non-tender, non-distended MUSCULOSKELETAL:  No edema; No deformity  SKIN: Warm and dry NEUROLOGIC:  Alert and oriented x 3 PSYCHIATRIC:  Normal affect   Signed, Garwin Brothers, MD  10/01/2017 1:50 PM    [facility]

## 2017-10-02 ENCOUNTER — Ambulatory Visit (INDEPENDENT_AMBULATORY_CARE_PROVIDER_SITE_OTHER): Payer: Self-pay | Admitting: Cardiothoracic Surgery

## 2017-10-02 ENCOUNTER — Ambulatory Visit
Admission: RE | Admit: 2017-10-02 | Discharge: 2017-10-02 | Disposition: A | Discharge status: Home / Self Care | Payer: Medicare Other | Source: Ambulatory Visit | Attending: Cardiothoracic Surgery | Admitting: Cardiothoracic Surgery

## 2017-10-02 VITALS — BP 92/60 | HR 96 | Resp 20 | Ht 68.5 in | Wt 172.0 lb

## 2017-10-02 DIAGNOSIS — Z951 Presence of aortocoronary bypass graft: Secondary | ICD-10-CM

## 2017-10-02 DIAGNOSIS — Z952 Presence of prosthetic heart valve: Secondary | ICD-10-CM

## 2017-10-02 DIAGNOSIS — J90 Pleural effusion, not elsewhere classified: Secondary | ICD-10-CM

## 2017-10-02 MED ORDER — FUROSEMIDE 40 MG PO TABS: 20.0000 mg | ORAL_TABLET | Freq: Every day | ORAL | 0 refills | Status: DC

## 2017-10-02 NOTE — Progress Notes (Signed)
[address]       [place] [address]             [phone]      Leonard Padilla [facility] Medical Record #[id number] Date of Birth: 1946/02/16  Referring: Revankar, Aundra Dubin, MD Primary Care: Carlis Stable, [place] Primary Cardiologist: Garwin Brothers, MD   Chief Complaint:   POST OP FOLLOW UP 09/09/2017 OPERATIVE REPORT PREOPERATIVE DIAGNOSIS:  Critical aortic stenosis with severe 3-vessel coronary artery disease, unstable angina. POSTOPERATIVE DIAGNOSIS:  Critical aortic stenosis with severe 3-vessel coronary artery disease, unstable angina. PROCEDURE PERFORMED:  Aortic valve replacement with pericardial tissue valve Edwards Lifesciences 23 mm, model 3300TFX, serial #[id number], and coronary artery bypass grafting x4 with the left internal mammary to the left anterior descending coronary artery, reverse saphenous vein graft to the diagonal coronary artery, reverse saphenous vein graft to the obtuse marginal coronary artery, reverse saphenous vein graft to the posterior descending coronary artery with right greater saphenous thigh and calf endo vein harvesting. SURGEON:  Sheliah Plane, MD.    History of Present Illness:     Patient doing well postoperatively last week he had some increasing shortness of breath chest x-ray revealed a left pleural effusion postoperatively.  Earlier this week he had a thoracentesis 1.4 L of fluid with withdrawn from the left chest.  He notes that he feels much better.  He denies pedal edema     Past Medical History:  Diagnosis Date  . Aortic stenosis    moderate AS 12/01/15 echo (Dr. Elberta Fortis)  . Arthritis   . CKD (chronic kidney disease) stage 3, GFR 30-59 ml/min (HCC) 08/04/2016  . CKD stage G3a/A1, GFR 45-59 and albumin creatinine ratio <30 mg/g (HCC) 08/04/2016  . Coronary artery disease   . Dysthymia 03/18/2017  . 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  Essential hypertension 10/13/2015  . Flu 09/2015   influenza A  . Former smoker, stopped smoking in distant past   . GERD (gastroesophageal reflux disease)    occ  . Heart murmur   . History of colon polyps   . Hyperlipidemia   . 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 Pneumonia 09/2015  . Postural dizziness with presyncope 08/27/2016  . Psoriasis   . Sleep apnea    to pick cpap tomorrow 12/13/15  . Stroke (HCC) 09/2015  . Symptomatic carotid artery stenosis 12/15/2015  . 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 Wears glasses      Social History   Tobacco Use  Smoking Status Former Smoker  . Last attempt to quit: 08/07/1983  . Years since quitting: 34.1  Smokeless Tobacco Never Used    Social History   Substance and Sexual Activity  Alcohol Use Yes   Comment: 1 glass of wine daily      Allergies  Allergen Reactions  . Viagra [Sildenafil Citrate] Other (See Comments)    COLOR DISCRIMINATION [DOSE RELATED] "blue sensation"    Current Outpatient Medications  Medication Sig Dispense Refill  . amiodarone (PACERONE) 100 MG tablet Take 1 tablet (100 mg total) by mouth daily. 90 tablet 1  . aspirin EC 81 MG tablet Take 1 tablet (81 mg total) by mouth daily. 90 tablet 1  . atorvastatin (LIPITOR) 40 MG tablet TAKE 2 TABLETS(80 MG) BY MOUTH DAILY (Patient taking differently: TAKE 2 TABLETS(80 MG) BY MOUTH AT BEDTIME) 180 tablet 0  . clobetasol cream (TEMOVATE) 0.05 %  Apply 1 application topically daily.    . ferrous gluconate (FERGON) 324 MG tablet Take 1 tablet (324 mg total) by mouth 2 (two) times daily with a meal. 60 tablet 3  . furosemide (LASIX) 40 MG tablet Take 0.5 tablets (20 mg total) by mouth daily. 90 tablet 0  . metoprolol tartrate (LOPRESSOR) 25 MG tablet Take 0.5 tablets (12.5 mg total) by mouth 2 (two) times daily. 30 tablet 1  . Potassium Chloride ER 20 MEQ TBCR TAKE 1 TABLET BY MOUTH DAILY 90 tablet 0  . sertraline (ZOLOFT) 25 MG tablet 1 tab PO daily. NEEDS  APPT FOR REFILLS (Patient taking differently: Take 25 mg by mouth daily. 1 tab PO daily. NEEDS APPT FOR REFILLS) 90 tablet 1  . tamsulosin (FLOMAX) 0.4 MG CAPS capsule Take 1 capsule (0.4 mg total) by mouth daily. (Patient taking differently: Take 415 mg by mouth daily. ) 90 capsule 0  . traMADol (ULTRAM) 50 MG tablet Take 1-2 tablets (50-100 mg total) by mouth every 6 (six) hours as needed for moderate pain. 30 tablet 0   No current facility-administered medications for this visit.        Physical Exam: BP 92/60   Pulse 96   Resp 20   Ht 5' 8.5" (1.74 m)   Wt 172 lb (78 kg)   SpO2 97% Comment: RA  BMI 25.77 kg/m   General appearance: alert and cooperative Neurologic: intact Heart: regular rate and rhythm, S1, S2 normal, no murmur, click, rub or gallop Lungs: clear to auscultation bilaterally Abdomen: soft, non-tender; bowel sounds normal; no masses,  no organomegaly Extremities: extremities normal, atraumatic, no cyanosis or edema and Homans sign is negative, no sign of DVT Wound: Sternum is stable and well-healed   Diagnostic Studies & Laboratory data:     Recent Radiology Findings:  Dg Chest 2 View  Result Date: 10/02/2017 CLINICAL DATA:  Left pleural effusion. Aortic valve repair on 09/09/2017. EXAM: CHEST  2 VIEW COMPARISON:  Radiographs dated 09/30/2017 and 09/24/2017 FINDINGS: Heart size and vascularity are normal. Almost complete resolution of bilateral pleural effusions. Minimal residual atelectasis in the lingula. CABG.  Aortic valve replacement.  Aortic atherosclerosis. No acute bone abnormality. IMPRESSION: Tiny residual bilateral pleural effusions. Aortic Atherosclerosis (ICD10-I70.0). Electronically Signed   By: Francene BoyersJames  Maxwell M.D.   On: 10/02/2017 15:41   [place] Thoracentesis Asp Pleural Space W/img Guide  Result Date: 09/30/2017 INDICATION: Patient is status post CABG/aortic valve replacement; now with dyspnea, left pleural effusion. Request made for therapeutic left  thoracentesis. EXAM: ULTRASOUND GUIDED THERAPEUTIC LEFT THORACENTESIS MEDICATIONS: None. COMPLICATIONS: None immediate. PROCEDURE: An ultrasound guided thoracentesis was thoroughly discussed with the patient and questions answered. The benefits, risks, alternatives and complications were also discussed. The patient understands and wishes to proceed with the procedure. Written consent was obtained. Ultrasound was performed to localize and mark an adequate pocket of fluid in the left chest. The area was then prepped and draped in the normal sterile fashion. 1% Lidocaine was used for local anesthesia. Under ultrasound guidance a 6 Fr Safe-T-Centesis catheter was introduced. Thoracentesis was performed. The catheter was removed and a dressing applied. FINDINGS: A total of approximately 1.4 liters of hazy, amber fluid was removed. IMPRESSION: Successful ultrasound guided therapeutic left thoracentesis yielding 1.4 liters of pleural fluid. Read by: Jeananne RamaKevin Allred, PA-C Electronically Signed   By: Simonne ComeJohn  Watts M.D.   On: 09/30/2017 10:56    I have independently reviewed the above radiology studies  and reviewed the findings with  the patient. Recent Lab Findings: Lab Results  Component Value Date   WBC 10.2 09/24/2017   HGB 9.2 (L) 09/24/2017   HCT 28.3 (L) 09/24/2017   PLT 452 (H) 09/24/2017   GLUCOSE 99 09/24/2017   CHOL 190 04/24/2017   TRIG 140 04/24/2017   HDL 69 04/24/2017   LDLCALC 115 (H) 08/03/2016   ALT 41 09/24/2017   AST 19 09/24/2017   NA 140 09/24/2017   K 4.5 09/24/2017   CL 102 09/24/2017   CREATININE 1.29 (H) 09/24/2017   BUN 20 09/24/2017   CO2 21 09/24/2017   TSH 2.891 09/15/2017   INR 1.60 09/09/2017   HGBA1C 5.5 09/04/2017      Assessment / Plan:   Patient making steady progress postoperatively, symptomatic shortness of breath is much improved with thoracentesis The patient will complete 1 month of amiodarone for brief postoperative atrial fibrillation currently on 100 mg a  day, . Lasix dosage was decreased to 20 mg a day Potassium dosage decreased to 10 mEq a day Patient will return with a follow-up visit and chest x-ray in 2 weeks    Delight Ovens MD      [address] [place] [address] Office [phone]   Beeper [phone]  10/03/2017 6:09 PM

## 2017-10-03 ENCOUNTER — Telehealth (HOSPITAL_COMMUNITY): Payer: Self-pay

## 2017-10-03 NOTE — Telephone Encounter (Signed)
Wife of patient call in regards to scheduling patient for Cardiac Rehab - Explained to patient that once our Nurse Navigator reviews appt I will then give them a call to get patient scheduled. Wife of patient understood.

## 2017-10-04 ENCOUNTER — Ambulatory Visit: Payer: Medicare Other | Admitting: Cardiology

## 2017-10-07 ENCOUNTER — Encounter: Payer: Self-pay | Admitting: Vascular Surgery

## 2017-10-07 ENCOUNTER — Encounter (HOSPITAL_COMMUNITY): Payer: Medicare Other

## 2017-10-07 ENCOUNTER — Ambulatory Visit: Payer: Medicare Other | Admitting: Vascular Surgery

## 2017-10-07 VITALS — BP 131/80 | HR 98 | Temp 97.9°F | Resp 20 | Ht 68.5 in | Wt 169.5 lb

## 2017-10-07 DIAGNOSIS — I6521 Occlusion and stenosis of right carotid artery: Secondary | ICD-10-CM

## 2017-10-07 NOTE — Progress Notes (Signed)
Patient is a 72 year old male who returns for follow-up today from prior carotid occlusive disease.  He underwent right carotid endarterectomy by Dr. Myra GianottiBrabham May 2017.  He denies any symptoms of TIA amaurosis or stroke.  He underwent aortic valve replacement and coronary artery bypass grafting by Dr. Tyrone SageGerhardt a few weeks ago.  He has recovered well from this.  He had no neurologic events during his hospital stay.  He currently is on aspirin and a statin.  Of note he did have bilateral ABIs as part of his preoperative cardiac workup which showed an ABI of 0.9 bilaterally.  Review of systems: He is still regaining his energy.  However he denies any shortness of breath or chest pain.  Current Outpatient Medications on File Prior to Visit  Medication Sig Dispense Refill  . amiodarone (PACERONE) 100 MG tablet Take 1 tablet (100 mg total) by mouth daily. 90 tablet 1  . aspirin EC 81 MG tablet Take 1 tablet (81 mg total) by mouth daily. 90 tablet 1  . atorvastatin (LIPITOR) 40 MG tablet TAKE 2 TABLETS(80 MG) BY MOUTH DAILY (Patient taking differently: TAKE 2 TABLETS(80 MG) BY MOUTH AT BEDTIME) 180 tablet 0  . clobetasol cream (TEMOVATE) 0.05 % Apply 1 application topically daily.    . ferrous gluconate (FERGON) 324 MG tablet Take 1 tablet (324 mg total) by mouth 2 (two) times daily with a meal. 60 tablet 3  . metoprolol tartrate (LOPRESSOR) 25 MG tablet Take 0.5 tablets (12.5 mg total) by mouth 2 (two) times daily. 30 tablet 1  . Potassium Chloride ER 20 MEQ TBCR TAKE 1 TABLET BY MOUTH DAILY 90 tablet 0  . sertraline (ZOLOFT) 25 MG tablet 1 tab PO daily. NEEDS APPT FOR REFILLS (Patient taking differently: Take 25 mg by mouth daily. 1 tab PO daily. NEEDS APPT FOR REFILLS) 90 tablet 1  . tamsulosin (FLOMAX) 0.4 MG CAPS capsule Take 1 capsule (0.4 mg total) by mouth daily. (Patient taking differently: Take 415 mg by mouth daily. ) 90 capsule 0  . traMADol (ULTRAM) 50 MG tablet Take 1-2 tablets (50-100 mg total)  by mouth every 6 (six) hours as needed for moderate pain. 30 tablet 0   No current facility-administered medications on file prior to visit.      Physical exam:  Vitals:   10/07/17 1311 10/07/17 1313  BP: 122/75 131/80  Pulse: 98   Resp: 20   Temp: 97.9 F (36.6 C)   TempSrc: Oral   SpO2: 97%   Weight: 169 lb 8 oz (76.9 kg)   Height: 5' 8.5" (1.74 m)     Extremities: 2+ radial pulses bilaterally  Neck: No carotid bruits  Chest: Clear to auscultation bilaterally  Cardiac: Regular rate and rhythm  Neuro: Symmetric upper extremity lower extremity motor strength 5/5 no facial asymmetry tongue is midline no difficulty swallowing  Data: I reviewed the patient's carotid duplex exam dated September 04, 2017 which showed less than 40% stenosis bilaterally.  Assessment: Doing well status post carotid endarterectomy.  Plan: Follow-up 1 year with our nurse practitioner with repeat carotid duplex exam.  He will return sooner if he has any neurologic symptoms.  Continue his aspirin and statin.  Fabienne Brunsharles Fields, MD [facility] Office: [phone] Pager: [phone]

## 2017-10-08 ENCOUNTER — Telehealth (HOSPITAL_COMMUNITY): Payer: Self-pay

## 2017-10-08 NOTE — Telephone Encounter (Signed)
Wife of patient called in regards to cardiac rehab to sign patient up for program - wife asked if their was another location to get patient in sooner. [place] Per patient consent, sent referral to [facility]. Closed referral.

## 2017-10-14 ENCOUNTER — Ambulatory Visit: Payer: Medicare Other

## 2017-10-16 ENCOUNTER — Telehealth: Payer: Self-pay | Admitting: Cardiology

## 2017-10-16 ENCOUNTER — Other Ambulatory Visit: Payer: Self-pay

## 2017-10-16 MED ORDER — METOPROLOL TARTRATE 25 MG PO TABS: 12.5000 mg | ORAL_TABLET | Freq: Two times a day (BID) | ORAL | 1 refills | Status: DC

## 2017-10-16 NOTE — Telephone Encounter (Signed)
Please call patient's wife regarding his medication and some changes from other Dr.

## 2017-10-16 NOTE — Telephone Encounter (Signed)
Patient's wife called stating that Renae Fickleaul would be stopping his amio, ferrous sulfate, and potassium tomorrow per Dr. Tyrone SageGerhardt. According to the wife the patient has an appointment tomorrow with Dr. Tyrone SageGerhardt; meds were not reconciled as the patient will be in their office tomorrow. Per the wife the patient is still taking furosemide; encouraged the wife to inquire about this at tomorrow's appointment as well to as the potassium if the diuretic is continued.

## 2017-10-17 ENCOUNTER — Other Ambulatory Visit: Payer: Self-pay | Admitting: Cardiothoracic Surgery

## 2017-10-17 ENCOUNTER — Ambulatory Visit
Admission: RE | Admit: 2017-10-17 | Discharge: 2017-10-17 | Disposition: A | Discharge status: Home / Self Care | Payer: Medicare Other | Source: Ambulatory Visit | Attending: Cardiothoracic Surgery | Admitting: Cardiothoracic Surgery

## 2017-10-17 ENCOUNTER — Other Ambulatory Visit: Payer: Self-pay

## 2017-10-17 ENCOUNTER — Encounter: Payer: Self-pay | Admitting: Cardiothoracic Surgery

## 2017-10-17 ENCOUNTER — Ambulatory Visit (INDEPENDENT_AMBULATORY_CARE_PROVIDER_SITE_OTHER): Payer: Self-pay | Admitting: Cardiothoracic Surgery

## 2017-10-17 VITALS — BP 121/73 | HR 84 | Resp 18 | Ht 68.5 in | Wt 169.4 lb

## 2017-10-17 DIAGNOSIS — J90 Pleural effusion, not elsewhere classified: Secondary | ICD-10-CM

## 2017-10-17 DIAGNOSIS — Z952 Presence of prosthetic heart valve: Secondary | ICD-10-CM

## 2017-10-17 NOTE — Progress Notes (Signed)
[address]       [place] [address]             [phone]      [facility] Medical Record #[id number] Date of Birth: 1946-03-03  Referring: Leonard Daub, MD Primary Care: Leonard Padilla, [place] Primary Cardiologist: Leonard Brothers, MD   Chief Complaint:   POST OP FOLLOW UP as 09/09/2017 OPERATIVE REPORT PREOPERATIVE DIAGNOSIS:  Critical aortic stenosis with severe 3-vessel coronary artery disease, unstable angina. POSTOPERATIVE DIAGNOSIS:  Critical aortic stenosis with severe 3-vessel coronary artery disease, unstable angina. PROCEDURE PERFORMED:  Aortic valve replacement with pericardial tissue valve [employer] 23 mm, model 3300TFX, serial #[id number], and coronary artery bypass grafting x4 with the left internal mammary to the left anterior descending coronary artery, reverse saphenous vein graft to the diagonal coronary artery, reverse saphenous vein graft to the obtuse marginal coronary artery, reverse saphenous vein graft to the posterior descending coronary artery with right greater saphenous thigh and calf endo vein harvesting. SURGEON:  Leonard Plane, MD.    History of Present Illness:     Patient returns to the office today after urgent aortic valve replacement and coronary artery bypass grafting February 4 his postoperative course was complicated by a left pleural effusion requiring thoracentesis and a course of diuretics. . He also had some urinary retention while he was in the hospital.  Since last seen his back to the urologist and notes that he is having no further urology problems.  He was anxious to start in cardiac rehab program but there has been a delay in space available.  He has increase his activity appropriately walking several miles a day without difficulty.      Past Medical History:  Diagnosis Date  . Aortic stenosis    moderate AS 12/01/15 echo (Dr. Elberta Padilla)  . Arthritis   .  CKD (chronic kidney disease) stage 3, GFR 30-59 ml/min (HCC) 08/04/2016  . CKD stage G3a/A1, GFR 45-59 and albumin creatinine ratio <30 mg/g (HCC) 08/04/2016  . Coronary artery disease   . Dysthymia 03/18/2017  . 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 Essential hypertension 10/13/2015  . Flu 09/2015   influenza A  . Former smoker, stopped smoking in distant past   . GERD (gastroesophageal reflux disease)    occ  . Heart murmur   . History of colon polyps   . Hyperlipidemia   . 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 Pneumonia 09/2015  . Postural dizziness with presyncope 08/27/2016  . Psoriasis   . Sleep apnea    to pick cpap tomorrow 12/13/15  . Stroke (HCC) 09/2015  . Symptomatic carotid artery stenosis 12/15/2015  . 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 Wears glasses      Social History   Tobacco Use  Smoking Status Former Smoker  . Last attempt to quit: 08/07/1983  . Years since quitting: 34.2  Smokeless Tobacco Never Used    Social History   Substance and Sexual Activity  Alcohol Use Yes   Comment: 1 glass of wine daily      Allergies  Allergen Reactions  . Viagra [Sildenafil Citrate] Other (See Comments)    COLOR DISCRIMINATION [DOSE RELATED] "blue sensation"    Current Outpatient Medications  Medication Sig Dispense Refill  . aspirin EC 81 MG tablet Take 1 tablet (81 mg total) by mouth daily. 90 tablet 1  . atorvastatin (  LIPITOR) 40 MG tablet TAKE 2 TABLETS(80 MG) BY MOUTH DAILY (Patient taking differently: TAKE 2 TABLETS(80 MG) BY MOUTH AT BEDTIME) 180 tablet 0  . clobetasol cream (TEMOVATE) 0.05 % Apply 1 application topically daily.    . metoprolol tartrate (LOPRESSOR) 25 MG tablet Take 0.5 tablets (12.5 mg total) by mouth 2 (two) times daily. 90 tablet 1  . Potassium Chloride ER 20 MEQ TBCR TAKE 1 TABLET BY MOUTH DAILY 90 tablet 0  . sertraline (ZOLOFT) 25 MG tablet 1 tab PO daily. NEEDS APPT FOR REFILLS (Patient  taking differently: Take 25 mg by mouth daily. 1 tab PO daily. NEEDS APPT FOR REFILLS) 90 tablet 1  . tamsulosin (FLOMAX) 0.4 MG CAPS capsule Take 1 capsule (0.4 mg total) by mouth daily. (Patient taking differently: Take 415 mg by mouth daily. ) 90 capsule 0  . traMADol (ULTRAM) 50 MG tablet Take 1-2 tablets (50-100 mg total) by mouth every 6 (six) hours as needed for moderate pain. 30 tablet 0   No current facility-administered medications for this visit.        Physical Exam: BP 121/73 (BP Location: Right Arm, Patient Position: Sitting, Cuff Size: Normal)   Pulse 84   Resp 18   Ht 5' 8.5" (1.74 m)   Wt 169 lb 6.4 oz (76.8 kg)   SpO2 98% Comment: RA  BMI 25.38 kg/m  General appearance: alert, cooperative, appears stated age and no distress Head: Normocephalic, without obvious abnormality, atraumatic Neck: no adenopathy, no carotid bruit, no JVD, supple, symmetrical, trachea midline and thyroid not enlarged, symmetric, no tenderness/mass/nodules Lymph nodes: Cervical, supraclavicular, and axillary nodes normal. Resp: clear to auscultation bilaterally Back: symmetric, no curvature. ROM normal. No CVA tenderness. Cardio: regular rate and rhythm, S1, S2 normal, no murmur, click, rub or gallop GI: soft, non-tender; bowel sounds normal; no masses,  no organomegaly Extremities: extremities normal, atraumatic, no cyanosis or edema Neurologic: Grossly normal   Diagnostic Studies & Laboratory data:     Recent Radiology Findings:  Dg Chest 2 View  Result Date: 10/17/2017 CLINICAL DATA:  Post aortic valve replacement on 09/09/2017 EXAM: CHEST - 2 VIEW COMPARISON:  Chest x-ray of 10/02/2017 FINDINGS: Linear atelectasis and/or scarring remains in the left mid lung and left lung base. Only a small left pleural effusion is present. Mediastinal and hilar contours are unremarkable. Mild cardiomegaly is Padilla. Aortic valve replacement is noted. Also changes of CABG are present. IMPRESSION: 1.  Small left pleural effusion remains. 2. Little change in linear atelectasis at the left mid lung base. Electronically Signed   By: Leonard Padilla M.D.   On: 10/17/2017 14:12    I have independently reviewed the above radiology studies  and reviewed the findings with the patient. Recent Lab Findings: Lab Results  Component Value Date   WBC 10.2 09/24/2017   HGB 9.2 (L) 09/24/2017   HCT 28.3 (L) 09/24/2017   PLT 452 (H) 09/24/2017   GLUCOSE 99 09/24/2017   CHOL 190 04/24/2017   TRIG 140 04/24/2017   HDL 69 04/24/2017   LDLCALC 97 04/24/2017   ALT 41 09/24/2017   AST 19 09/24/2017   NA 140 09/24/2017   K 4.5 09/24/2017   CL 102 09/24/2017   CREATININE 1.29 (H) 09/24/2017   BUN 20 09/24/2017   CO2 21 09/24/2017   TSH 2.891 09/15/2017   INR 1.60 09/09/2017   HGBA1C 5.5 09/04/2017      Assessment / Plan:   Patient making steady progress postoperatively, symptomatic shortness  of breath is much improved with thoracentesis No evidence of recurrent pleural effusions on chest x-ray today. The patient will stop his amiodarone Lasix and potassium Instructions were given for a graded increase in exercise program until he can start in cardiac rehab We will plan to see him back in 3 months He was again given the warnings concerning long-term prophylaxis for dental and other invasive procedures   Leonard OvensEdward B Bryson Gavia MD      [address] [place] [id number] Office [phone]   Beeper [phone]  10/17/2017 2:49 PM

## 2017-10-17 NOTE — Patient Instructions (Signed)
Heart  Valve Replacement-Care After  Read the instructions outlined below and refer to this sheet for the next few weeks. These discharge instructions provide you with general information on caring for yourself after you leave the hospital. Your surgeon may also give you specific instructions. While your treatment has been planned according to the most current medical practices available, unavoidable complications occasionally occur. If you have any problems or questions after discharge, please call your surgeon. AFTER THE PROCEDURE  Full recovery from heart valve surgery can take several months.   Blood thinning (anticoagulation) treatment with warfarin is some times prescribed for 6 weeks to 3 months after surgery for those with biological valves. It is prescribed for life for those with mechanical valves.   Recovery includes healing of the surgical incision. There is a gradual building of stamina and exercise abilities. An exercise program under the direction of a physical therapist may be recommended.   Once you have an artificial valve, your heart function and your life will return to normal. You usually feel better after surgery. Shortness of breath and fatigue should lessen. If your heart was already severely damaged before your surgery, you may continue to have problems.    Individuals with an aortic valve replacement need to take antibiotics before having dental work or other surgical procedures. This is called prophylactic antibiotic treatment. These drugs help to prevent infective endocarditis. Antibiotics are only recommended for individuals with the highest risk for developing infective endocarditis. Let your dentist and your caregiver know if you have a history of any of the following so that the necessary precautions can be taken:  Endocarditis in the past.   An artificial (prosthetic) heart valve.  HOME CARE INSTRUCTIONS   Use all medications as prescribed.   Take your  temperature every morning for the first week after surgery. Record these.   Weigh yourself every morning for at least the first week after surgery and record.   Do not lift more than 15 pounds until your breastbone (sternum) has healed, about 3 months. Avoid all activities which would place strain on your incision.   You may shower as soon as directed by your caregiver after surgery. Pat incisions dry. Do not rub incisions with washcloth or towel.   Avoid driving for 4 weeks following surgery or as instructed.  Pain Control  If a prescription was given for a pain reliever, please follow your doctor's directions.   If the pain is not relieved by your medicine, becomes worse, or you have difficulty breathing, call your surgeon.  Activity  Take frequent rest periods throughout the day.   Wait one week before returning to strenuous activities such as heavy lifting (more than 10 pounds), pushing or pulling.   Talk with your doctor about when you may return to work and your exercise routine.   Do not drive while taking prescription pain medication.  Nutrition  You may resume your normal diet.   Drink plenty of fluids (6-8 glasses a day).   Eat a well-balanced diet.   Call your caregiver for persistent nausea or vomiting.  Elimination Your normal bowel function should return. If constipation should occur, you may:  Take a mild laxative.   Add fruit and bran to your diet.   Drink more fluids.   Call your doctor if constipation is not relieved.  SEEK IMMEDIATE MEDICAL CARE IF:   You develop chest pain which is not coming from your surgical cut (incision).   You develop shortness of   breath or have difficulty breathing.   You develop a temperature over 101 F (38.3 C).   You have a sudden weight gain. Let your caregiver know what the weight gain is.   You develop a rash.   You develop any reaction or side effects to medications given.   You have increased bleeding from  wounds.   You see redness, swelling, or have increasing pain in wounds.   You have pus coming from your wound.   You develop lightheadedness or feel faint.  Endocarditis Information  You may be at risk for developing endocarditis since you have  an artificial heart valve  or a repaired heart valve. Endocarditis is an infection of the lining of the heart or heart valves.   Certain surgical and dental procedures may put you at risk,  such as teeth cleaning or other dental procedures or any surgery involving the respiratory, urinary, gastrointestinal tract, gallbladder or prostate.   Notify your doctor or dentist before having any invasive procedures. You will need to take antibiotics before certain procedures.   To prevent endocarditis, maintain good oral health. Seek prompt medical attention for any mouth/gum, skin or urinary tract infections.        [address]       [place] [address]             [phone]       Coronary Artery Bypass Grafting  Care After  Refer to this sheet in the next few weeks. These instructions provide you with information on caring for yourself after your procedure. Your caregiver may also give you more specific instructions. Your treatment has been planned according to current medical practices, but problems sometimes occur. Call your caregiver if you have any problems or questions after your procedure.  Recovery from open heart surgery will be different for everyone. Some people feel well after 3 or 4 weeks, while for others it takes longer. After heart surgery, it may be normal to:  Not have an appetite, feel nauseated by the smell of food, or only want to eat a small amount.   Be constipated because of changes in your diet, activity, and medicines. Eat foods high in fiber. Add fresh fruits and vegetables to your diet. Stool softeners may be helpful.   Feel sad or unhappy. You may be frustrated or cranky. You may have good days and  bad days. Do not give up. Talk to your caregiver if you do not feel better.   Feel weakness and fatigue. You many need physical therapy or cardiac rehabilitation to get your strength back.   Develop an irregular heartbeat called atrial fibrillation. Symptoms of atrial fibrillation are a fast, irregular heartbeat or feelings of fluttery heartbeats, shortness of breath, low blood pressure, and dizziness. If these symptoms develop, see your caregiver right away.  MEDICATION  Have a list of all the medicines you will be taking when you leave the hospital. For every medicine, know the following:   Name.   Exact dose.   Time of day to be taken.   How often it should be taken.   Why you are taking it.   Ask which medicines should or should not be taken together. If you take more than one heart medicine, ask if it is okay to take them together. Some heart medicines should not be taken at the same time because they may lower your blood pressure too much.   Narcotic pain medicine can cause constipation. Eat  fresh fruits and vegetables. Add fiber to your diet. Stool softener medicine may help relieve constipation.   Keep a copy of your medicines with you at all times.   Do not add or stop taking any medicine until you check with your caregiver.   Medicines can have side effects. Call your caregiver who prescribed the medicine if you:   Start throwing up, have diarrhea, or have stomach pain.   Feel dizzy or lightheaded when you stand up.   Feel your heart is skipping beats or is beating too fast or too slow.   Develop a rash.   Notice unusual bruising or bleeding.  HOME CARE INSTRUCTIONS  After heart surgery, it is important to learn how to take your pulse. Have your caregiver show you how to take your pulse.   Use your incentive spirometer. Ask your caregiver how long after surgery you need to use it.  Care of your chest incision  Tell your caregiver right away if you notice  clicking in your chest (sternum).   Support your chest with a pillow or your arms when you take deep breaths and cough.   Follow your caregiver's instructions about when you can bathe or swim.   Protect your incision from sunlight during the first year to keep the scar from getting dark.   Tell your caregiver if you notice:   Increased tenderness of your incision.   Increased redness or swelling around your incision.   Drainage or pus from your incision.  Care of your leg incision(s)  Avoid crossing your legs.   Avoid sitting for long periods of time. Change positions every half hour.   Elevate your leg(s) when you are sitting.   Check your leg(s) daily for swelling. Check the incisions for redness or drainage.   Diet is very important to heart health.   Eat plenty of fresh fruits and vegetables. Meats should be lean cut. Avoid canned, processed, and fried foods.   Talk to a dietician. They can teach you how to make healthy food and drink choices.  Weight  Weigh yourself every day. This is important because it helps to know if you are retaining fluid that may make your heart and lungs work harder.   Use the same scale each time.   Weigh yourself every morning at the same time. You should do this after you go to the bathroom, but before you eat breakfast.   Your weight will be more accurate if you do not wear any clothes.   Record your weight.   Tell your caregiver if you have gained 2 pounds or more overnight.  Activity Stop any activity at once if you have chest pain, shortness of breath, irregular heartbeats, or dizziness. Get help right away if you have any of these symptoms.  Bathing.  Avoid soaking in a bath or hot tub until your incisions are healed.   Rest. You need a balance of rest and activity.   Exercise. Exercise per your caregiver's advice. You may need physical therapy or cardiac rehabilitation to help strengthen your muscles and build your endurance.     Climbing stairs. Unless your caregiver tells you not to climb stairs, go up stairs slowly and rest if you tire. Do not pull yourself up by the handrail.   Driving a car. Follow your caregiver's advice on when you may drive. You may ride as a passenger at any time. When traveling for long periods of time in a car, get out of  the car and walk around for a few minutes every 2 hours.   Lifting. Avoid lifting, pushing, or pulling anything heavier than 10 pounds for 6 weeks after surgery or as told by your caregiver.   Returning to work. Check with your caregiver. People heal at different rates. Most people will be able to go back to work 6 to 12 weeks after surgery.   Sexual activity. You may resume sexual relations as told by your caregiver.  SEEK MEDICAL CARE IF:  Any of your incisions are red, painful, or have any type of drainage coming from them.   You have an oral temperature above 101.5 F .   You have ankle or leg swelling.   You have pain in your legs.   You have weight gain of 2 or more pounds a day.   You feel dizzy or lightheaded when you stand up.  SEEK IMMEDIATE MEDICAL CARE IF:  You have angina or chest pain that goes to your jaw or arms. Call your local emergency services right away.   You have shortness of breath at rest or with activity.   You have a fast or irregular heartbeat (arrhythmia).   There is a "clicking" in your sternum when you move.   You have numbness or weakness in your arms or legs.  MAKE SURE YOU:  Understand these instructions.   Will watch your condition.   Will get help right away if you are not doing well or get worse.    No lifting over 25 lbs for 3 months

## 2017-10-22 ENCOUNTER — Other Ambulatory Visit: Payer: Self-pay | Admitting: Physician Assistant

## 2017-10-22 DIAGNOSIS — I6521 Occlusion and stenosis of right carotid artery: Secondary | ICD-10-CM

## 2017-10-23 MED ORDER — ATORVASTATIN CALCIUM 80 MG PO TABS: 80.0000 mg | ORAL_TABLET | Freq: Every day | ORAL | 3 refills | Status: DC

## 2017-11-08 ENCOUNTER — Ambulatory Visit: Payer: Medicare Other | Admitting: Physician Assistant

## 2017-11-08 ENCOUNTER — Encounter: Payer: Self-pay | Admitting: Physician Assistant

## 2017-11-08 VITALS — BP 132/80 | HR 69 | Wt 173.0 lb

## 2017-11-08 DIAGNOSIS — G4733 Obstructive sleep apnea (adult) (pediatric): Secondary | ICD-10-CM

## 2017-11-08 DIAGNOSIS — I739 Peripheral vascular disease, unspecified: Secondary | ICD-10-CM

## 2017-11-08 DIAGNOSIS — N183 Chronic kidney disease, stage 3 unspecified: Secondary | ICD-10-CM

## 2017-11-08 DIAGNOSIS — F341 Dysthymic disorder: Secondary | ICD-10-CM | POA: Diagnosis not present

## 2017-11-08 DIAGNOSIS — I25810 Atherosclerosis of coronary artery bypass graft(s) without angina pectoris: Secondary | ICD-10-CM

## 2017-11-08 DIAGNOSIS — Z951 Presence of aortocoronary bypass graft: Secondary | ICD-10-CM | POA: Diagnosis not present

## 2017-11-08 DIAGNOSIS — E785 Hyperlipidemia, unspecified: Secondary | ICD-10-CM

## 2017-11-08 DIAGNOSIS — R454 Irritability and anger: Secondary | ICD-10-CM | POA: Diagnosis not present

## 2017-11-08 DIAGNOSIS — I10 Essential (primary) hypertension: Secondary | ICD-10-CM

## 2017-11-08 DIAGNOSIS — F482 Pseudobulbar affect: Secondary | ICD-10-CM

## 2017-11-08 DIAGNOSIS — J302 Other seasonal allergic rhinitis: Secondary | ICD-10-CM | POA: Insufficient documentation

## 2017-11-08 HISTORY — DX: Other seasonal allergic rhinitis: J30.2

## 2017-11-08 HISTORY — DX: Presence of aortocoronary bypass graft: Z95.1

## 2017-11-08 LAB — BASIC METABOLIC PANEL WITH GFR
BUN/Creatinine Ratio: 20 (calc) (ref 6–22)
BUN: 26 mg/dL — ABNORMAL HIGH (ref 7–25)
CO2: 24 mmol/L (ref 20–32)
Calcium: 9.6 mg/dL (ref 8.6–10.3)
Chloride: 106 mmol/L (ref 98–110)
Creat: 1.29 mg/dL — ABNORMAL HIGH (ref 0.70–1.18)
GFR, Est African American: 63 mL/min/{1.73_m2} (ref >=60)
GFR, Est Non African American: 55 mL/min/{1.73_m2} — ABNORMAL LOW (ref >=60)
Glucose, Bld: 87 mg/dL (ref 65–99)
Potassium: 4.7 mmol/L (ref 3.5–5.3)
Sodium: 140 mmol/L (ref 135–146)

## 2017-11-08 LAB — LIPID PANEL W/REFLEX DIRECT LDL
Cholesterol: 144 mg/dL (ref <200)
HDL: 50 mg/dL (ref >40)
LDL Cholesterol (Calc): 73 mg/dL (calc)
Non-HDL Cholesterol (Calc): 94 mg/dL (calc) (ref <130)
Total CHOL/HDL Ratio: 2.9 (calc) (ref <5.0)
Triglycerides: 133 mg/dL (ref <150)

## 2017-11-08 MED ORDER — SERTRALINE HCL 25 MG PO TABS: 25.0000 mg | ORAL_TABLET | Freq: Every day | ORAL | 3 refills | Status: DC

## 2017-11-08 NOTE — Progress Notes (Signed)
HPI:                                                                Leonard Padilla is a 72 y.o. male who presents to [facility] Leonard Padilla: Primary Care Sports Medicine today for medication management  Patient is a pleasant 72 yo M with PMH of CAD s/p CABG, PAD (ABI 0.9), AS, hx of CVA, carotid artery disease s/p carotid endarterectomy, ectatic abdominal aorta, OSA, GERD who presents for follow-up. He is 1 month post-op from 4-vessel CABG and aortic valve replacement. Reports he is doing great. No pain at his incision site. No angina, dyspnea, or claudication. He is starting cardiac rehab next week. Currently taking Metoprolol and baby aspirin for secondary prevention.  Reports he is taking Allegra-D and Flonase for seasonal allergies. This is working well for his symptoms of nasal congestion and rhinorrhea. He wants to make sure it is safe to take with his current medications.  Depression screen [name] 2/9 04/24/2017 03/18/2017 08/03/2016 04/03/2016  Decreased Interest 0 3 0 0  Down, Depressed, Hopeless 0 0 0 0  PHQ - 2 Score 0 3 0 0  Altered sleeping 0 0 0 -  Tired, decreased energy 1 1 0 -  Change in appetite 0 0 1 -  Feeling bad or failure about yourself  0 0 0 -  Trouble concentrating 0 3 0 -  Moving slowly or fidgety/restless 0 0 0 -  Suicidal thoughts 0 0 0 -  PHQ-9 Score 1 7 1  -    GAD 7 : Generalized Anxiety Score 04/29/2017 03/18/2017  Nervous, Anxious, on Edge 0 1  Control/stop worrying 0 0  Worry too much - different things 0 0  Trouble relaxing 0 0  Restless 0 0  Easily annoyed or irritable 1 3  Afraid - awful might happen 0 0  Total GAD 7 Score 1 4      Past Medical History:  Diagnosis Date  . Aortic stenosis    moderate AS 12/01/15 echo (Dr. Elberta Fortis)  . Arthritis   . CKD (chronic kidney disease) stage 3, GFR 30-59 ml/min (HCC) 08/04/2016  . CKD stage G3a/A1, GFR 45-59 and albumin creatinine ratio <30 mg/g (HCC) 08/04/2016  . Coronary artery disease   .  Dysthymia 03/18/2017  . 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 Essential hypertension 10/13/2015  . Flu 09/2015   influenza A  . Former smoker, stopped smoking in distant past   . GERD (gastroesophageal reflux disease)    occ  . Heart murmur   . History of colon polyps   . Hyperlipidemia   . 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 Pneumonia 09/2015  . Postural dizziness with presyncope 08/27/2016  . Psoriasis   . Sleep apnea    to pick cpap tomorrow 12/13/15  . Stroke (HCC) 09/2015  . Symptomatic carotid artery stenosis 12/15/2015  . 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 Wears glasses    Past Surgical History:  Procedure Laterality Date  . AORTIC VALVE REPLACEMENT N/A 09/09/2017   Procedure: AORTIC VALVE REPLACEMENT (AVR) using a 23 Edwards Perimount Magna Ease Aortic Valve;  Surgeon: Delight Ovens, MD;  Location: [facility] OR;  Service: Open Heart Surgery;  Laterality: N/A;  . COLONOSCOPY W/ BIOPSIES AND POLYPECTOMY    . CORONARY ARTERY BYPASS GRAFT N/A 09/09/2017   Procedure: CORONARY ARTERY BYPASS GRAFTING (CABG) x 4 using the left internal mammary artery and right greater saphenous vein harvested endoscopically.;  Surgeon: Delight Ovens, MD;  Location: [facility] OR;  Service: Open Heart Surgery;  Laterality: N/A;  . ENDARTERECTOMY Right 12/15/2015   Procedure: RIGHT CAROTID ENDARTERECTOMY WITH PATCH ANGIOPLASTY;  Surgeon: Nada Libman, MD;  Location: [facility] OR;  Service: Vascular;  Laterality: Right;  . PATCH ANGIOPLASTY Right 12/15/2015   Procedure: RIGHT CAROTID PATCH ANGIOPLASTY;  Surgeon: Nada Libman, MD;  Location: [facility] OR;  Service: Vascular;  Laterality: Right;  . RIGHT/LEFT HEART CATH AND CORONARY ANGIOGRAPHY N/A 08/23/2017   Procedure: RIGHT/LEFT HEART CATH AND CORONARY ANGIOGRAPHY;  Surgeon: Swaziland, Peter M, MD;  Location: [facility] INVASIVE CV LAB;  Service: Cardiovascular;  Laterality: N/A;  . TEE WITHOUT CARDIOVERSION N/A 09/09/2017   Procedure:  TRANSESOPHAGEAL ECHOCARDIOGRAM (TEE);  Surgeon: Delight Ovens, MD;  Location: [facility] OR;  Service: Open Heart Surgery;  Laterality: N/A;  . WISDOM TOOTH EXTRACTION     Social History   Tobacco Use  . Smoking status: Former Smoker    Last attempt to quit: 08/07/1983    Years since quitting: 34.2  . Smokeless tobacco: Never Used  Substance Use Topics  . Alcohol use: Yes    Comment: 1 glass of wine daily    family history includes Cancer in his father and mother.    ROS: negative except as noted in the HPI  Medications: Current Outpatient Medications  Medication Sig Dispense Refill  . aspirin EC 81 MG tablet Take 1 tablet (81 mg total) by mouth daily. 90 tablet 1  . atorvastatin (LIPITOR) 80 MG tablet Take 1 tablet (80 mg total) by mouth at bedtime. 90 tablet 3  . clobetasol cream (TEMOVATE) 0.05 % Apply 1 application topically daily.    . fexofenadine-pseudoephedrine (ALLEGRA-D 24) 180-240 MG 24 hr tablet Take 1 tablet by mouth daily.    . fluticasone (FLONASE) 50 MCG/ACT nasal spray Place into both nostrils daily.    . metoprolol tartrate (LOPRESSOR) 25 MG tablet Take 0.5 tablets (12.5 mg total) by mouth 2 (two) times daily. 90 tablet 1  . sertraline (ZOLOFT) 25 MG tablet Take 1 tablet (25 mg total) by mouth daily. 90 tablet 3  . tamsulosin (FLOMAX) 0.4 MG CAPS capsule Take 1 capsule (0.4 mg total) by mouth daily. (Patient taking differently: Take 415 mg by mouth daily. ) 90 capsule 0   No current facility-administered medications for this visit.    Allergies  Allergen Reactions  . Viagra [Sildenafil Citrate] Other (See Comments)    COLOR DISCRIMINATION [DOSE RELATED] "blue sensation"       Objective:  BP 132/80   Pulse 69   Wt 173 lb (78.5 kg)   BMI 25.92 kg/m  Gen:  alert, not ill-appearing, no distress, appropriate for age HEENT: head normocephalic without obvious abnormality, conjunctiva and cornea clear, trachea midline Pulm: Normal work of breathing, normal  phonation, clear to auscultation bilaterally, no wheezes, rales or rhonchi CV: Normal rate, regular rhythm, s1 and s2 distinct, no murmurs, clicks or rubs; no carotid bruit; radial pulses 2+ symmetric  Neuro: alert and oriented x 3, no tremor MSK: chest wall non-tender, extremities atraumatic, normal gait and station Skin: well-healed longitudinal thoracotomy scar,  intact, no rashes on exposed skin, no jaundice, no cyanosis Psych: well-groomed, cooperative, good eye contact,  euthymic mood, affect mood-congruent, speech is articulate, and thought processes clear and goal-directed    No results found for this or any previous visit (from the past 72 hour(s)). No results found.  Lab Results  Component Value Date   CHOL 190 04/24/2017   CHOL 198 08/03/2016   CHOL 223 (H) 04/03/2016   Lab Results  Component Value Date   HDL 69 04/24/2017   HDL 60 08/03/2016   HDL 75 04/03/2016   Lab Results  Component Value Date   LDLCALC 97 04/24/2017   LDLCALC 115 (H) 08/03/2016   LDLCALC 115 04/03/2016   Lab Results  Component Value Date   TRIG 140 04/24/2017   TRIG 114 08/03/2016   TRIG 163 (H) 04/03/2016   Lab Results  Component Value Date   CHOLHDL 2.8 04/24/2017   CHOLHDL 3.3 08/03/2016   CHOLHDL 3.0 04/03/2016   No results found for: LDLDIRECT  Lab Results  Component Value Date   CREATININE 1.29 (H) 09/24/2017   BUN 20 09/24/2017   NA 140 09/24/2017   K 4.5 09/24/2017   CL 102 09/24/2017   CO2 21 09/24/2017     Assessment and Plan: 72 y.o. male with  Coronary artery disease involving coronary bypass graft of native heart without angina pectoris  Status post four vessel coronary artery bypass - Plan: Lipid Panel w/reflex Direct LDL, BASIC METABOLIC PANEL WITH GFR  Peripheral arterial disease (HCC) - ABI 0.9, followed by Vascular  Hypertension goal BP (blood pressure) < 140/90  CKD (chronic kidney disease) stage 3, GFR 30-59 ml/min (HCC) - Plan: BASIC METABOLIC PANEL  WITH GFR, Urine Microalbumin w/creat. ratio  Dysthymia - Plan: sertraline (ZOLOFT) 25 MG tablet  Irritability and anger - Plan: sertraline (ZOLOFT) 25 MG tablet  PBA (pseudobulbar affect) - Plan: sertraline (ZOLOFT) 25 MG tablet  Dyslipidemia, goal LDL below 70 - Plan: Lipid Panel w/reflex Direct LDL  Seasonal allergic rhinitis, unspecified trigger - Plan: fexofenadine-pseudoephedrine (ALLEGRA-D 24) 180-240 MG 24 hr tablet, fluticasone (FLONASE) 50 MCG/ACT nasal spray  Obstructive sleep apnea  Hypertension BP Readings from Last 3 Encounters:  11/08/17 132/80  10/17/17 121/73  10/07/17 131/80  - BP at goal <140/90 - discussed he needs to monitor his BP at home closely if he is taking oral decongestants or NSAIDs. Educated on supplements to avoid. - counseled on therapeutic lifestyle changes   Labs today to monitor lipids and CKD.  We discussed LDL goal <70 due to his CAD/PAD. He wishes to lower his statin dose. I believe he will need high intensity statin therapy for life. He will keep follow-up with neurology, vascular, and cardiology. He will start cardiac rehab next week Cont ASA for secondary prevention. I am confirming anticoagulation with his cardiologist (sent staff message) I will provide antibiotic prophylaxis for dental procedures as needed He will follow with me every 6 months for his health maintenance and medication management   Patient education and anticipatory guidance given Patient agrees with treatment plan Follow-up in 6 months or sooner as needed if symptoms worsen or fail to improve  Levonne Hubertharley E. Forest Redwine PA-C

## 2017-11-08 NOTE — Patient Instructions (Signed)
For your blood pressure: - Goal <130/80 - caution with oral decongestants and NSAID's, which can raise your BP. Monitor closely - continue baby aspirin 81 mg daily to help prevent heart attack/stroke - monitor and log blood pressures at home - check around the same time each day in a relaxed setting - Limit salt to <2000 mg/day - Follow DASH eating plan - limit alcohol to 2 standard drinks per day for men and 1 per day for women - avoid tobacco products - weight loss: 7% of current body weight - follow-up every 6 months for your blood pressure   Physical Activity Recommendations for modifying lipids and lowering blood pressure Engage in aerobic physical activity to reduce LDL-cholesterol, non-HDL-cholesterol, and blood pressure  Frequency: 3-4 sessions per week  Intensity: moderate to vigorous  Duration: 40 minutes on average  Physical Activity Recommendations for secondary prevention 1. Aerobic exercise  Frequency: 3-5 sessions per week  Intensity: 50-80% capacity  Duration: 20 - 60 minutes  Examples: walking, treadmill, cycling, rowing, stair climbing, and arm/leg ergometry  2. Resistance exercise  Frequency: 2-3 sessions per week  Intensity: 10-15 repetitions/set to moderate fatigue  Duration: 1-3 sets of 8-10 upper and lower body exercises  Examples: calisthenics, elastic bands, cuff/hand weights, dumbbels, free weights, wall pulleys, and weight machines  Heart-Healthy Lifestyle  Eating a diet rich in vegetables, fruits and whole grains: also includes low-fat dairy products, poultry, fish, legumes, and nuts; limit intake of sweets, sugar-sweetened beverages and red meats  Getting regular exercise  Maintaining a healthy weight  Not smoking or getting help quitting  Staying on top of your health; for some people, lifestyle changes alone may not be enough to prevent a heart attack or stroke. In these cases, taking a statin at the right dose will most likely be  necessary

## 2017-11-09 ENCOUNTER — Other Ambulatory Visit: Payer: Self-pay | Admitting: Physician Assistant

## 2017-11-11 ENCOUNTER — Telehealth (HOSPITAL_COMMUNITY): Payer: Self-pay | Admitting: Pharmacist

## 2017-11-11 NOTE — Telephone Encounter (Signed)
Clobetasol spray for   Cardiac Rehab Medication Review by a Pharmacist  Does the patient  feel that his/her medications are working for him/her?  yes  Has the patient been experiencing any side effects to the medications prescribed?  no  Does the patient measure his/her own blood pressure or blood glucose at home?  no   Does the patient have any problems obtaining medications due to transportation or finances?   no  Understanding of regimen: good Understanding of indications: good Potential of compliance: good    Pharmacist comments: [name] is a pleasant [id number] YOM recently hospitalized for aortic valve replacement. He endorses compliance with all his medications and denies any issues/side effects. He has used Allegra-D (with pseudoephedrine) in the past but does not use it anymore because he doesn't want to "mess with his blood pressure". I agreed that pseudoephedrine products should be avoided. He has a blood pressure monitor at home but does not use it very often. No further issues/questions at this time.    Leonard CorpusLindsey Harald Padilla, PharmD PGY1 Pharmacy Resident Email: [email or web address] [phone]: [phone] 11/11/2017 5:51 PM

## 2017-11-12 ENCOUNTER — Encounter (HOSPITAL_COMMUNITY)
Admission: RE | Admit: 2017-11-12 | Discharge: 2017-11-12 | Disposition: A | Discharge status: Home / Self Care | Payer: Medicare Other | Source: Ambulatory Visit | Attending: Cardiology | Admitting: Cardiology

## 2017-11-12 VITALS — Ht 68.0 in | Wt 173.1 lb

## 2017-11-12 DIAGNOSIS — Z951 Presence of aortocoronary bypass graft: Secondary | ICD-10-CM

## 2017-11-12 DIAGNOSIS — Z48812 Encounter for surgical aftercare following surgery on the circulatory system: Secondary | ICD-10-CM | POA: Insufficient documentation

## 2017-11-12 DIAGNOSIS — Z952 Presence of prosthetic heart valve: Secondary | ICD-10-CM | POA: Diagnosis present

## 2017-11-12 NOTE — Progress Notes (Signed)
Cardiac Individual Treatment Plan  Patient Details  Name: IZYAN EZZELL MRN: [id number] Date of Birth: June 16, 1946 Referring Provider:   Flowsheet Row CARDIAC REHAB PHASE II ORIENTATION from 11/12/2017 in [facility] CARDIAC [facility]  Referring Provider  Revankar, Glean Hess MD      Initial Encounter Date:  Flowsheet Row CARDIAC REHAB PHASE II ORIENTATION from 11/12/2017 in [facility] CARDIAC REHAB  Date  11/12/17  Referring Provider  Revankar, Glean Hess MD      Visit Diagnosis: S/P AVR  S/P CABG (coronary artery bypass graft)  Patient's Home Medications on Admission:  Current Outpatient Medications:  .  aspirin EC 81 MG tablet, Take 1 tablet (81 mg total) by mouth daily., Disp: 90 tablet, Rfl: 1 .  atorvastatin (LIPITOR) 80 MG tablet, Take 1 tablet (80 mg total) by mouth at bedtime., Disp: 90 tablet, Rfl: 3 .  Clobetasol Propionate (TEMOVATE) 0.05 % external spray, Apply 1 spray topically 2 (two) times daily., Disp: , Rfl:  .  fluticasone (FLONASE) 50 MCG/ACT nasal spray, Place into both nostrils daily., Disp: , Rfl:  .  metoprolol tartrate (LOPRESSOR) 25 MG tablet, Take 0.5 tablets (12.5 mg total) by mouth 2 (two) times daily., Disp: 90 tablet, Rfl: 1 .  sertraline (ZOLOFT) 25 MG tablet, Take 1 tablet (25 mg total) by mouth daily., Disp: 90 tablet, Rfl: 3 .  tamsulosin (FLOMAX) 0.4 MG CAPS capsule, TAKE 1 CAPSULE DAILY, Disp: 90 capsule, Rfl: 0  Past Medical History: Past Medical History:  Diagnosis Date  . Aortic stenosis    moderate AS 12/01/15 echo (Dr. Elberta Fortis)  . Arthritis   . CKD (chronic kidney disease) stage 3, GFR 30-59 ml/min (HCC) 08/04/2016  . CKD stage G3a/A1, GFR 45-59 and albumin creatinine ratio <30 mg/g (HCC) 08/04/2016  . Coronary artery disease   . Dysthymia 03/18/2017  . 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 Essential hypertension 10/13/2015  . Flu 09/2015   influenza A  . Former smoker, stopped  smoking in distant past   . GERD (gastroesophageal reflux disease)    occ  . Heart murmur   . History of colon polyps   . Hyperlipidemia   . 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 Pneumonia 09/2015  . Postural dizziness with presyncope 08/27/2016  . Psoriasis   . Sleep apnea    to pick cpap tomorrow 12/13/15  . Stroke (HCC) 09/2015  . Symptomatic carotid artery stenosis 12/15/2015  . 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 Wears glasses     Tobacco Use: Social History   Tobacco Use  Smoking Status Former Smoker  . Last attempt to quit: 08/07/1983  . Years since quitting: 34.2  Smokeless Tobacco Never Used    Labs: Recent Review Flowsheet Data    Labs for ITP Cardiac and Pulmonary Rehab Latest Ref Rng & Units 09/09/2017 09/09/2017 09/09/2017 09/10/2017 11/08/2017   Cholestrol <200 mg/dL - - - - 409   LDLCALC mg/dL (calc) - - - - 73   HDL >40 mg/dL - - - - 50   Trlycerides <150 mg/dL - - - - 811   Hemoglobin A1c 4.8 - 5.6 % - - - - -   PHART 7.350 - 7.450 7.376 - 7.338(L) - -   PCO2ART 32.0 - 48.0 mmHg 33.3 - 39.7 - -   HCO3 20.0 - 28.0 mmol/L 19.5(L) - 21.3 - -   TCO2 22 - 32 mmol/L 21(L) 22 23 20(L) -  ACIDBASEDEF 0.0 - 2.0 mmol/L 5.0(H) - 4.0(H) - -   O2SAT % 99.0 - 99.0 - -      Capillary Blood Glucose: Lab Results  Component Value Date   GLUCAP 95 09/19/2017   GLUCAP 113 (H) 09/19/2017   GLUCAP 103 (H) 09/18/2017   GLUCAP 116 (H) 09/18/2017   GLUCAP 143 (H) 09/18/2017     Exercise Target Goals: Date: 11/12/17  Exercise Program Goal: Individual exercise prescription set using results from initial 6 min walk test and THRR while considering  patient's activity barriers and safety.   Exercise Prescription Goal: Initial exercise prescription builds to 30-45 minutes a day of aerobic activity, 2-3 days per week.  Home exercise guidelines will be given to patient during program as part of exercise prescription that the participant will acknowledge.  Activity Barriers  & Risk Stratification: Activity Barriers & Cardiac Risk Stratification - 11/12/17 1427    Activity Barriers & Cardiac Risk Stratification          Activity Barriers  Arthritis;Deconditioning;Muscular Weakness;Other (comment)    Comments  B ankle stiffness    Cardiac Risk Stratification  High           6 Minute Walk: 6 Minute Walk    6 Minute Walk    Row Name 11/12/17 1512 11/12/17 1519   Phase  Initial  no documentation   Distance  2028 feet  no documentation   Walk Time  6 minutes  no documentation   # of Rest Breaks  0  no documentation   MPH  3.8  no documentation   METS  4.1  no documentation   RPE  12  no documentation   VO2 Peak  14.4  no documentation   Symptoms  Yes (comment)  no documentation   Comments  arch in bottom of feet (B)  no documentation   Resting HR  78 bpm  no documentation   Resting BP  120/80  no documentation   Resting Oxygen Saturation   99 %  no documentation   Exercise Oxygen Saturation  during 6 min walk  100 %  no documentation   Max Ex. HR  110 bpm  no documentation   Max Ex. BP  122/82  no documentation   2 Minute Post BP  no documentation  119/70          Oxygen Initial Assessment:   Oxygen Re-Evaluation:   Oxygen Discharge (Final Oxygen Re-Evaluation):   Initial Exercise Prescription: Initial Exercise Prescription - 11/12/17 1500    Date of Initial Exercise RX and Referring Provider          Date  11/12/17    Referring Provider  Revankar, Glean Hess MD        Treadmill          MPH  3    Grade  1    Minutes  15    METs  3.71        Bike          Level  1    Minutes  15    METs  3.36        NuStep          Level  4    SPM  80    Minutes  15    METs  3        Prescription Details          Frequency (times per week)  3    Duration  Progress to 30 minutes of continuous aerobic without signs/symptoms of physical distress        Intensity          THRR 40-80% of Max Heartrate  59-118    Ratings of Perceived  Exertion  11-13    Perceived Dyspnea  0-4        Progression          Progression  Continue to progress workloads to maintain intensity without signs/symptoms of physical distress.        Resistance Training          Training Prescription  Yes    Weight  4lbs    Reps  10-15           Perform Capillary Blood Glucose checks as needed.  Exercise Prescription Changes:   Exercise Comments:   Exercise Goals and Review: Exercise Goals    Exercise Goals    Row Name 11/12/17 1428   Increase Physical Activity  Yes   Intervention  Provide advice, education, support and counseling about physical activity/exercise needs.;Develop an individualized exercise prescription for aerobic and resistive training based on initial evaluation findings, risk stratification, comorbidities and participant's personal goals.   Expected Outcomes  Short Term: Attend rehab on a regular basis to increase amount of physical activity.;Long Term: Add in home exercise to make exercise part of routine and to increase amount of physical activity.;Long Term: Exercising regularly at least 3-5 days a week.   Increase Strength and Stamina  Yes improve HS flexibility/ mobility throughout   Intervention  Provide advice, education, support and counseling about physical activity/exercise needs.;Develop an individualized exercise prescription for aerobic and resistive training based on initial evaluation findings, risk stratification, comorbidities and participant's personal goals.   Expected Outcomes  Short Term: Increase workloads from initial exercise prescription for resistance, speed, and METs.;Short Term: Perform resistance training exercises routinely during rehab and add in resistance training at home;Long Term: Improve cardiorespiratory fitness, muscular endurance and strength as measured by increased METs and functional capacity ( )   Able to understand and use rate of perceived exertion (RPE) scale  Yes    Intervention  Provide education and explanation on how to use RPE scale   Expected Outcomes  Short Term: Able to use RPE daily in rehab to express subjective intensity level;Long Term:  Able to use RPE to guide intensity level when exercising independently   Knowledge and understanding of Target Heart Rate Range (THRR)  Yes   Intervention  Provide education and explanation of THRR including how the numbers were predicted and where they are located for reference   Expected Outcomes  Long Term: Able to use THRR to govern intensity when exercising independently;Short Term: Able to state/look up THRR;Short Term: Able to use daily as guideline for intensity in rehab   Able to check pulse independently  Yes   Intervention  Provide education and demonstration on how to check pulse in carotid and radial arteries.;Review the importance of being able to check your own pulse for safety during independent exercise   Expected Outcomes  Short Term: Able to explain why pulse checking is important during independent exercise;Long Term: Able to check pulse independently and accurately   Understanding of Exercise Prescription  Yes   Intervention  Provide education, explanation, and written materials on patient's individual exercise prescription   Expected Outcomes  Short Term: Able to explain program exercise prescription;Long Term: Able to explain home exercise prescription to exercise independently  Exercise Goals Re-Evaluation :    Discharge Exercise Prescription (Final Exercise Prescription Changes):   Nutrition:  Target Goals: Understanding of nutrition guidelines, daily intake of sodium 1500mg , cholesterol 200mg , calories 30% from fat and 7% or less from saturated fats, daily to have 5 or more servings of fruits and vegetables.  Biometrics: Pre Biometrics - 11/12/17 1518    Pre Biometrics          Height  5\' 8"  (1.727 m)    Weight  173 lb 1 oz (78.5 kg)    Waist Circumference  36.5  inches    Hip Circumference  40 inches    Waist to Hip Ratio  0.91 %    BMI (Calculated)  26.32    Triceps Skinfold  16 mm    % Body Fat  25.8 %    Grip Strength  46 kg    Flexibility  9 in    Single Leg Stand  28 seconds            Nutrition Therapy Plan and Nutrition Goals: Nutrition Therapy & Goals - 11/12/17 1516    Nutrition Therapy          Diet  Heart Healthy        Personal Nutrition Goals          Nutrition Goal  Pt to identify food quantities necessary to maintain weight loss at graduation from cardiac rehab.        Intervention Plan          Intervention  Prescribe, educate and counsel regarding individualized specific dietary modifications aiming towards targeted core components such as weight, hypertension, lipid management, diabetes, heart failure and other comorbidities.;Nutrition handout(s) given to patient.    Expected Outcomes  Short Term Goal: Understand basic principles of dietary content, such as calories, fat, sodium, cholesterol and nutrients.;Long Term Goal: Adherence to prescribed nutrition plan.           Nutrition Assessments: Nutrition Assessments - 11/12/17 1517    MEDFICTS Scores          Pre Score  33           Nutrition Goals Re-Evaluation:   Nutrition Goals Re-Evaluation:   Nutrition Goals Discharge (Final Nutrition Goals Re-Evaluation):   Psychosocial: Target Goals: Acknowledge presence or absence of significant depression and/or stress, maximize coping skills, provide positive support system. Participant is able to verbalize types and ability to use techniques and skills needed for reducing stress and depression.  Initial Review & Psychosocial Screening:   Quality of Life Scores: Quality of Life - 11/12/17 1552    Quality of Life Scores          Health/Function Pre  27.57 %    Socioeconomic Pre  25.42 %    Psych/Spiritual Pre  23 %    Family Pre  30 %    GLOBAL Pre  26.55 %          Scores of 19 and below  usually indicate a poorer quality of life in these areas.  A difference of  2-3 points is a clinically meaningful difference.  A difference of 2-3 points in the total score of the Quality of Life Index has been associated with significant improvement in overall quality of life, self-image, physical symptoms, and general health in studies assessing change in quality of life.  PHQ-9: Recent Review Flowsheet Data    Depression screen [facility] 2/9 04/24/2017 03/18/2017 08/03/2016 04/03/2016   Decreased Interest 0 3 0  0   Down, Depressed, Hopeless 0 0 0 0   PHQ - 2 Score 0 3 0 0   Altered sleeping 0 0 0 -   Tired, decreased energy 1 1 0 -   Change in appetite 0 0 1 -   Feeling bad or failure about yourself  0 0 0 -   Trouble concentrating 0 3 0 -   Moving slowly or fidgety/restless 0 0 0 -   Suicidal thoughts 0 0 0 -   PHQ-9 Score 1 7 1  -     Interpretation of Total Score  Total Score Depression Severity:  1-4 = Minimal depression, 5-9 = Mild depression, 10-14 = Moderate depression, 15-19 = Moderately severe depression, 20-27 = Severe depression   Psychosocial Evaluation and Intervention:   Psychosocial Re-Evaluation:   Psychosocial Discharge (Final Psychosocial Re-Evaluation):   Vocational Rehabilitation: Provide vocational rehab assistance to qualifying candidates.   Vocational Rehab Evaluation & Intervention:   Education: Education Goals: Education classes will be provided on a weekly basis, covering required topics. Participant will state understanding/return demonstration of topics presented.  Learning Barriers/Preferences: Learning Barriers/Preferences - 11/12/17 1427    Learning Barriers/Preferences          Learning Barriers  Sight    Learning Preferences  Written Material;Skilled Demonstration;Pictoral           Education Topics: Count Your Pulse:  -Group instruction provided by verbal instruction, demonstration, patient participation and written materials to support  subject.  Instructors address importance of being able to find your pulse and how to count your pulse when at home without a heart monitor.  Patients get hands on experience counting their pulse with staff help and individually.   Heart Attack, Angina, and Risk Factor Modification:  -Group instruction provided by verbal instruction, video, and written materials to support subject.  Instructors address signs and symptoms of angina and heart attacks.    Also discuss risk factors for heart disease and how to make changes to improve heart health risk factors.   Functional Fitness:  -Group instruction provided by verbal instruction, demonstration, patient participation, and written materials to support subject.  Instructors address safety measures for doing things around the house.  Discuss how to get up and down off the floor, how to pick things up properly, how to safely get out of a chair without assistance, and balance training.   Meditation and Mindfulness:  -Group instruction provided by verbal instruction, patient participation, and written materials to support subject.  Instructor addresses importance of mindfulness and meditation practice to help reduce stress and improve awareness.  Instructor also leads participants through a meditation exercise.    Stretching for Flexibility and Mobility:  -Group instruction provided by verbal instruction, patient participation, and written materials to support subject.  Instructors lead participants through series of stretches that are designed to increase flexibility thus improving mobility.  These stretches are additional exercise for major muscle groups that are typically performed during regular warm up and cool down.   Hands Only CPR:  -Group verbal, video, and participation provides a basic overview of AHA guidelines for community CPR. Role-play of emergencies allow participants the opportunity to practice calling for help and chest compression  technique with discussion of AED use.   Hypertension: -Group verbal and written instruction that provides a basic overview of hypertension including the most recent diagnostic guidelines, risk factor reduction with self-care instructions and medication management.    Nutrition I class: Heart Healthy Eating:  -Group instruction  provided by PowerPoint slides, verbal discussion, and written materials to support subject matter. The instructor gives an explanation and review of the Therapeutic Lifestyle Changes diet recommendations, which includes a discussion on lipid goals, dietary fat, sodium, fiber, plant stanol/sterol esters, sugar, and the components of a well-balanced, healthy diet.   Nutrition II class: Lifestyle Skills:  -Group instruction provided by PowerPoint slides, verbal discussion, and written materials to support subject matter. The instructor gives an explanation and review of label reading, grocery shopping for heart health, heart healthy recipe modifications, and ways to make healthier choices when eating out.   Diabetes Question & Answer:  -Group instruction provided by PowerPoint slides, verbal discussion, and written materials to support subject matter. The instructor gives an explanation and review of diabetes co-morbidities, pre- and post-prandial blood glucose goals, pre-exercise blood glucose goals, signs, symptoms, and treatment of hypoglycemia and hyperglycemia, and foot care basics.   Diabetes Blitz:  -Group instruction provided by PowerPoint slides, verbal discussion, and written materials to support subject matter. The instructor gives an explanation and review of the physiology behind type 1 and type 2 diabetes, diabetes medications and rational behind using different medications, pre- and post-prandial blood glucose recommendations and Hemoglobin A1c goals, diabetes diet, and exercise including blood glucose guidelines for exercising safely.    Portion Distortion:   -Group instruction provided by PowerPoint slides, verbal discussion, written materials, and food models to support subject matter. The instructor gives an explanation of serving size versus portion size, changes in portions sizes over the last 20 years, and what consists of a serving from each food group.   Stress Management:  -Group instruction provided by verbal instruction, video, and written materials to support subject matter.  Instructors review role of stress in heart disease and how to cope with stress positively.     Exercising on Your Own:  -Group instruction provided by verbal instruction, power point, and written materials to support subject.  Instructors discuss benefits of exercise, components of exercise, frequency and intensity of exercise, and end points for exercise.  Also discuss use of nitroglycerin and activating EMS.  Review options of places to exercise outside of rehab.  Review guidelines for sex with heart disease.   Cardiac Drugs I:  -Group instruction provided by verbal instruction and written materials to support subject.  Instructor reviews cardiac drug classes: antiplatelets, anticoagulants, beta blockers, and statins.  Instructor discusses reasons, side effects, and lifestyle considerations for each drug class.   Cardiac Drugs II:  -Group instruction provided by verbal instruction and written materials to support subject.  Instructor reviews cardiac drug classes: angiotensin converting enzyme inhibitors (ACE-I), angiotensin II receptor blockers (ARBs), nitrates, and calcium channel blockers.  Instructor discusses reasons, side effects, and lifestyle considerations for each drug class.   Anatomy and Physiology of the Circulatory System:  Group verbal and written instruction and models provide basic cardiac anatomy and physiology, with the coronary electrical and arterial systems. Review of: AMI, Angina, Valve disease, Heart Failure, Peripheral Artery Disease,  Cardiac Arrhythmia, Pacemakers, and the ICD.   Other Education:  -Group or individual verbal, written, or video instructions that support the educational goals of the cardiac rehab program.   Holiday Eating Survival Tips:  -Group instruction provided by PowerPoint slides, verbal discussion, and written materials to support subject matter. The instructor gives patients tips, tricks, and techniques to help them not only survive but enjoy the holidays despite the onslaught of food that accompanies the holidays.   Knowledge Questionnaire Score:  Core Components/Risk Factors/Patient Goals at Admission: Personal Goals and Risk Factors at Admission - 11/12/17 1522    Core Components/Risk Factors/Patient Goals on Admission           Weight Management  Weight Maintenance    Intervention  Weight Management: Develop a combined nutrition and exercise program designed to reach desired caloric intake, while maintaining appropriate intake of nutrient and fiber, sodium and fats, and appropriate energy expenditure required for the weight goal.;Weight Management: Provide education and appropriate resources to help participant work on and attain dietary goals.    Expected Outcomes  Weight Maintenance: Understanding of the daily nutrition guidelines, which includes 25-35% calories from fat, 7% or less cal from saturated fats, less than 200mg  cholesterol, less than 1.5gm of sodium, & 5 or more servings of fruits and vegetables daily    Hypertension  Yes    Intervention  Provide education on lifestyle modifcations including regular physical activity/exercise, weight management, moderate sodium restriction and increased consumption of fresh fruit, vegetables, and low fat dairy, alcohol moderation, and smoking cessation.;Monitor prescription use compliance.    Expected Outcomes  Short Term: Continued assessment and intervention until BP is < 140/66mm HG in hypertensive participants. < 130/70mm HG in hypertensive  participants with diabetes, heart failure or chronic kidney disease.;Long Term: Maintenance of blood pressure at goal levels.    Lipids  Yes    Intervention  Provide education and support for participant on nutrition & aerobic/resistive exercise along with prescribed medications to achieve LDL 70mg , HDL >40mg .    Expected Outcomes  Short Term: Participant states understanding of desired cholesterol values and is compliant with medications prescribed. Participant is following exercise prescription and nutrition guidelines.;Long Term: Cholesterol controlled with medications as prescribed, with individualized exercise RX and with personalized nutrition plan. Value goals: LDL < 70mg , HDL > 40 mg.           Core Components/Risk Factors/Patient Goals Review:    Core Components/Risk Factors/Patient Goals at Discharge (Final Review):    ITP Comments: ITP Comments    Row Name 11/12/17 1343   ITP Comments  Dr. Armanda Magic, Medical Director       Comments: Patient attended orientation from 1333 to 1459 to review rules and guidelines for program. Completed 6 minute walk test, Intitial ITP, and exercise prescription.  VSS. Telemetry-sinus rhythm.   Asymptomatic. Deveron Furlong, RN, BSN Cardiac Pulmonary Rehab 11/12/17 4:38 PM

## 2017-11-12 NOTE — Progress Notes (Signed)
Leonard Padilla 72 y.o. male DOB: 09/14/1945 MRN: [id number]      Nutrition Note  1. S/P AVR   2. S/P CABG (coronary artery bypass graft)    Past Medical History:  Diagnosis Date  . Aortic stenosis    moderate AS 12/01/15 echo (Dr. Elberta Fortisamnitz)  . Arthritis   . CKD (chronic kidney disease) stage 3, GFR 30-59 ml/min (HCC) 08/04/2016  . CKD stage G3a/A1, GFR 45-59 and albumin creatinine ratio <30 mg/g (HCC) 08/04/2016  . Coronary artery disease   . Dysthymia 03/18/2017  . 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 Essential hypertension 10/13/2015  . Flu 09/2015   influenza A  . Former smoker, stopped smoking in distant past   . GERD (gastroesophageal reflux disease)    occ  . Heart murmur   . History of colon polyps   . Hyperlipidemia   . 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 Pneumonia 09/2015  . Postural dizziness with presyncope 08/27/2016  . Psoriasis   . Sleep apnea    to pick cpap tomorrow 12/13/15  . Stroke (HCC) 09/2015  . Symptomatic carotid artery stenosis 12/15/2015  . 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 Wears glasses    Meds reviewed.   HT: Ht Readings from Last 1 Encounters:  10/17/17 5' 8.5" (1.74 m)    WT: Wt Readings from Last 5 Encounters:  11/08/17 173 lb (78.5 kg)  10/17/17 169 lb 6.4 oz (76.8 kg)  10/07/17 169 lb 8 oz (76.9 kg)  10/02/17 172 lb (78 kg)  10/01/17 172 lb (78 kg)     BMI: 25.38   Current tobacco use? No, quit smoking in 1985    Labs:  Lipid Panel     Component Value Date/Time   CHOL 144 11/08/2017 1334   TRIG 133 11/08/2017 1334   HDL 50 11/08/2017 1334   CHOLHDL 2.9 11/08/2017 1334   VLDL 23 08/03/2016 1115   LDLCALC 73 11/08/2017 1334    Lab Results  Component Value Date   HGBA1C 5.5 09/04/2017   CBG (last 3)  No results for input(s): GLUCAP in the last 72 hours.  Nutrition Note Spoke with pt. Nutrition plan and goals reviewed with pt. Pt is following Step 2 of the Therapeutic  Lifestyle Changes diet. Pt wants to maintain the weight that was lost after his surgery. Discussed maintaining healthy dietary changes that have been introduced to recently. Wife was present for appointment and was very supportive of dietary changes. Pt expressed understanding of the information reviewed. Pt aware of nutrition education classes offered and handouts from class were provided to pt.   Nutrition Diagnosis  ? Overweight related to excessive energy intake as evidenced by a BMI of 25.38  Nutrition Intervention ? Pt's individual nutrition plan and goals reviewed with pt. ? Pt given handouts for: ? Nutrition I class ? Nutrition II class   Nutrition Goal(s):  ? Pt to identify food quantities necessary to maintain weight loss at graduation from cardiac rehab.  Plan:  Will provide client-centered nutrition education as part of interdisciplinary care.   Monitor and evaluate progress toward nutrition goal with team.  Leonard Padilla, Dietetic Intern 11/12/2017 2:23 PM

## 2017-11-12 NOTE — Progress Notes (Signed)
Good afternoon Leonard Padilla,  Your labs look pretty good!  Your LDL cholesterol is nearly at goal <70. I definitely recommend you continue your Atorvastatin 80 mg daily.  Your chronic kidney disease is stable. We will continue to monitor this every 6 months.  Best, Vinetta Bergamoharley

## 2017-11-18 ENCOUNTER — Encounter (HOSPITAL_COMMUNITY)
Admission: RE | Admit: 2017-11-18 | Discharge: 2017-11-18 | Disposition: A | Discharge status: Home / Self Care | Payer: Medicare Other | Source: Ambulatory Visit | Attending: Cardiology | Admitting: Cardiology

## 2017-11-18 DIAGNOSIS — Z952 Presence of prosthetic heart valve: Secondary | ICD-10-CM

## 2017-11-18 DIAGNOSIS — Z951 Presence of aortocoronary bypass graft: Secondary | ICD-10-CM

## 2017-11-18 DIAGNOSIS — Z48812 Encounter for surgical aftercare following surgery on the circulatory system: Secondary | ICD-10-CM | POA: Diagnosis not present

## 2017-11-18 NOTE — Progress Notes (Signed)
Daily Session Note  Patient Details  Name: Leonard Padilla MRN: [id number] Date of Birth: 1946-05-25 Referring Provider:     CARDIAC REHAB PHASE II ORIENTATION from 11/12/2017 in [place]  Referring Provider  Jyl Heinz MD      Encounter Date: 11/18/2017  Check In: Session Check In - 11/18/17 1404      Check-In   Location  [facility]    Staff Present  Luetta Nutting Fair, MS, ACSM RCEP, Exercise Physiologist;Ashonti Leandro, RN, BSN;Other;Tyara Nevels, MS,ACSM CEP, Exercise Physiologist    Supervising physician immediately available to respond to emergencies  [facility] immediately available    Physician(s)  Dr. Alfredia Ferguson    Medication changes reported      No    Fall or balance concerns reported     No    Tobacco Cessation  No Change    Warm-up and Cool-down  Performed as group-led instruction    Resistance Training Performed  Yes    VAD Patient?  No      Pain Assessment   Currently in Pain?  No/denies    Multiple Pain Sites  No       Capillary Blood Glucose: No results found for this or any previous visit (from the past 24 hour(s)).    Social History   Tobacco Use  Smoking Status Former Smoker  . Last attempt to quit: 08/07/1983  . Years since quitting: 34.3  Smokeless Tobacco Never Used    Goals Met:  Exercise tolerated well  Goals Unmet:  Not Applicable  Comments: [name] started cardiac rehab today.  Pt tolerated light exercise without difficulty. VSS, telemetry-Sinus Rhtyhm, asymptomatic.  Medication list reconciled. Pt denies barriers to medicaiton compliance.  PSYCHOSOCIAL ASSESSMENT:  PHQ-0. Pt exhibits positive coping skills, hopeful outlook with supportive family. No psychosocial needs identified at this time, no psychosocial interventions necessary.    Pt enjoys reading and swimming.   Pt oriented to exercise equipment and routine.    Understanding verbalized.Barnet Pall, RN,BSN 11/18/2017 2:20 PM   Dr.  Fransico Him is Medical Director for Cardiac Rehab at [facility].

## 2017-11-20 ENCOUNTER — Encounter (HOSPITAL_COMMUNITY): Payer: Self-pay

## 2017-11-20 ENCOUNTER — Encounter (HOSPITAL_COMMUNITY)
Admission: RE | Admit: 2017-11-20 | Discharge: 2017-11-20 | Disposition: A | Discharge status: Home / Self Care | Payer: Medicare Other | Source: Ambulatory Visit | Attending: Cardiology | Admitting: Cardiology

## 2017-11-20 DIAGNOSIS — Z951 Presence of aortocoronary bypass graft: Secondary | ICD-10-CM

## 2017-11-20 DIAGNOSIS — Z48812 Encounter for surgical aftercare following surgery on the circulatory system: Secondary | ICD-10-CM | POA: Diagnosis not present

## 2017-11-20 DIAGNOSIS — Z952 Presence of prosthetic heart valve: Secondary | ICD-10-CM

## 2017-11-21 NOTE — Progress Notes (Signed)
Cardiac Individual Treatment Plan  Patient Details  Name: Leonard Padilla MRN: [id number] Date of Birth: Jan 20, 1946 Referring Provider:   Flowsheet Row CARDIAC REHAB PHASE II ORIENTATION from 11/12/2017 in [facility] CARDIAC [facility]  Referring Provider  Revankar, Glean Hess MD      Initial Encounter Date:  Flowsheet Row CARDIAC REHAB PHASE II ORIENTATION from 11/12/2017 in [facility] CARDIAC REHAB  Date  11/12/17  Referring Provider  Revankar, Glean Hess MD      Visit Diagnosis: S/P CABG (coronary artery bypass graft)  S/P AVR  Patient's Home Medications on Admission:  Current Outpatient Medications:  .  aspirin EC 81 MG tablet, Take 1 tablet (81 mg total) by mouth daily., Disp: 90 tablet, Rfl: 1 .  atorvastatin (LIPITOR) 80 MG tablet, Take 1 tablet (80 mg total) by mouth at bedtime., Disp: 90 tablet, Rfl: 3 .  Clobetasol Propionate (TEMOVATE) 0.05 % external spray, Apply 1 spray topically 2 (two) times daily., Disp: , Rfl:  .  fluticasone (FLONASE) 50 MCG/ACT nasal spray, Place into both nostrils daily., Disp: , Rfl:  .  metoprolol tartrate (LOPRESSOR) 25 MG tablet, Take 0.5 tablets (12.5 mg total) by mouth 2 (two) times daily., Disp: 90 tablet, Rfl: 1 .  sertraline (ZOLOFT) 25 MG tablet, Take 1 tablet (25 mg total) by mouth daily., Disp: 90 tablet, Rfl: 3 .  tamsulosin (FLOMAX) 0.4 MG CAPS capsule, TAKE 1 CAPSULE DAILY, Disp: 90 capsule, Rfl: 0  Past Medical History: Past Medical History:  Diagnosis Date  . Aortic stenosis    moderate AS 12/01/15 echo (Dr. Elberta Fortis)  . Arthritis   . CKD (chronic kidney disease) stage 3, GFR 30-59 ml/min (HCC) 08/04/2016  . CKD stage G3a/A1, GFR 45-59 and albumin creatinine ratio <30 mg/g (HCC) 08/04/2016  . Coronary artery disease   . Dysthymia 03/18/2017  . 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 Essential hypertension 10/13/2015  . Flu 09/2015   influenza A  . Former smoker, stopped  smoking in distant past   . GERD (gastroesophageal reflux disease)    occ  . Heart murmur   . History of colon polyps   . Hyperlipidemia   . 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 Pneumonia 09/2015  . Postural dizziness with presyncope 08/27/2016  . Psoriasis   . Sleep apnea    to pick cpap tomorrow 12/13/15  . Stroke (HCC) 09/2015  . Symptomatic carotid artery stenosis 12/15/2015  . 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 Wears glasses     Tobacco Use: Social History   Tobacco Use  Smoking Status Former Smoker  . Last attempt to quit: 08/07/1983  . Years since quitting: 34.3  Smokeless Tobacco Never Used    Labs: Recent Hydrographic surveyor    Labs for ITP Cardiac and Pulmonary Rehab Latest Ref Rng & Units 09/09/2017 09/09/2017 09/09/2017 09/10/2017 11/08/2017   Cholestrol <200 mg/dL - - - - 782   LDLCALC mg/dL (calc) - - - - 73   HDL >40 mg/dL - - - - 50   Trlycerides <150 mg/dL - - - - 956   Hemoglobin A1c 4.8 - 5.6 % - - - - -   PHART 7.350 - 7.450 7.376 - 7.338(L) - -   PCO2ART 32.0 - 48.0 mmHg 33.3 - 39.7 - -   HCO3 20.0 - 28.0 mmol/L 19.5(L) - 21.3 - -   TCO2 22 - 32 mmol/L 21(L) 22 23 20(L) -  ACIDBASEDEF 0.0 - 2.0 mmol/L 5.0(H) - 4.0(H) - -   O2SAT % 99.0 - 99.0 - -      Capillary Blood Glucose: Lab Results  Component Value Date   GLUCAP 95 09/19/2017   GLUCAP 113 (H) 09/19/2017   GLUCAP 103 (H) 09/18/2017   GLUCAP 116 (H) 09/18/2017   GLUCAP 143 (H) 09/18/2017     Exercise Target Goals:    Exercise Program Goal: Individual exercise prescription set using results from initial 6 min walk test and THRR while considering  patient's activity barriers and safety.   Exercise Prescription Goal: Initial exercise prescription builds to 30-45 minutes a day of aerobic activity, 2-3 days per week.  Home exercise guidelines will be given to patient during program as part of exercise prescription that the participant will acknowledge.  Activity Barriers & Risk  Stratification: Activity Barriers & Cardiac Risk Stratification - 11/12/17 1427    Activity Barriers & Cardiac Risk Stratification          Activity Barriers  Arthritis;Deconditioning;Muscular Weakness;Other (comment)    Comments  B ankle stiffness    Cardiac Risk Stratification  High           6 Minute Walk: 6 Minute Walk    6 Minute Walk    Row Name 11/12/17 1512 11/12/17 1519   Phase  Initial  no documentation   Distance  2028 feet  no documentation   Walk Time  6 minutes  no documentation   # of Rest Breaks  0  no documentation   MPH  3.8  no documentation   METS  4.1  no documentation   RPE  12  no documentation   VO2 Peak  14.4  no documentation   Symptoms  Yes (comment)  no documentation   Comments  arch in bottom of feet (B)  no documentation   Resting HR  78 bpm  no documentation   Resting BP  120/80  no documentation   Resting Oxygen Saturation   99 %  no documentation   Exercise Oxygen Saturation  during 6 min walk  100 %  no documentation   Max Ex. HR  110 bpm  no documentation   Max Ex. BP  122/82  no documentation   2 Minute Post BP  no documentation  119/70          Oxygen Initial Assessment:   Oxygen Re-Evaluation:   Oxygen Discharge (Final Oxygen Re-Evaluation):   Initial Exercise Prescription: Initial Exercise Prescription - 11/12/17 1500    Date of Initial Exercise RX and Referring Provider          Date  11/12/17    Referring Provider  Revankar, Glean Hess MD        Treadmill          MPH  3    Grade  1    Minutes  15    METs  3.71        Bike          Level  1    Minutes  15    METs  3.36        NuStep          Level  4    SPM  80    Minutes  15    METs  3        Prescription Details          Frequency (times per week)  3    Duration  Progress to 30 minutes of continuous aerobic without signs/symptoms of physical distress        Intensity          THRR 40-80% of Max Heartrate  59-118    Ratings of Perceived  Exertion  11-13    Perceived Dyspnea  0-4        Progression          Progression  Continue to progress workloads to maintain intensity without signs/symptoms of physical distress.        Resistance Training          Training Prescription  Yes    Weight  4lbs    Reps  10-15           Perform Capillary Blood Glucose checks as needed.  Exercise Prescription Changes: Exercise Prescription Changes    Response to Exercise    Row Name 11/18/17 1400   Blood Pressure (Admit)  112/70   Blood Pressure (Exercise)  128/78   Blood Pressure (Exit)  100/70   Heart Rate (Admit)  84 bpm   Heart Rate (Exercise)  109 bpm   Heart Rate (Exit)  91 bpm   Rating of Perceived Exertion (Exercise)  11   Symptoms  non   Comments  pt responded well to first exercise session   Duration  Continue with 30 min of aerobic exercise without signs/symptoms of physical distress.   Intensity  THRR unchanged       Progression    Row Name 11/18/17 1400   Progression  Continue to progress workloads to maintain intensity without signs/symptoms of physical distress.   Average METs  3.1       Resistance Training    Row Name 11/18/17 1400   Training Prescription  Yes   Weight  4lbs   Reps  10-15   Time  10 Minutes       Treadmill    Row Name 11/18/17 1400   MPH  3   Grade  1   Minutes  15   METs  3.71       NuStep    Row Name 11/18/17 1400   Level  4   SPM  80   Minutes  15   METs  2.4          Exercise Comments: Exercise Comments    Row Name 11/18/17 1444   Exercise Comments  Pt responded well to first exercise session in cardiac rehab. Pt was able to exercise without signs/symptoms of physical distress. Cardiac rehab staff will continue to monitor pt and progress activity as tolerated.       Exercise Goals and Review: Exercise Goals    Exercise Goals    Row Name 11/12/17 1428   Increase Physical Activity  Yes   Intervention  Provide advice, education, support and counseling  about physical activity/exercise needs.;Develop an individualized exercise prescription for aerobic and resistive training based on initial evaluation findings, risk stratification, comorbidities and participant's personal goals.   Expected Outcomes  Short Term: Attend rehab on a regular basis to increase amount of physical activity.;Long Term: Add in home exercise to make exercise part of routine and to increase amount of physical activity.;Long Term: Exercising regularly at least 3-5 days a week.   Increase Strength and Stamina  Yes improve HS flexibility/ mobility throughout   Intervention  Provide advice, education, support and counseling about physical activity/exercise needs.;Develop an individualized exercise prescription for aerobic and resistive training based on initial evaluation findings,  risk stratification, comorbidities and participant's personal goals.   Expected Outcomes  Short Term: Increase workloads from initial exercise prescription for resistance, speed, and METs.;Short Term: Perform resistance training exercises routinely during rehab and add in resistance training at home;Long Term: Improve cardiorespiratory fitness, muscular endurance and strength as measured by increased METs and functional capacity (6MWT)   Able to understand and use rate of perceived exertion (RPE) scale  Yes   Intervention  Provide education and explanation on how to use RPE scale   Expected Outcomes  Short Term: Able to use RPE daily in rehab to express subjective intensity level;Long Term:  Able to use RPE to guide intensity level when exercising independently   Knowledge and understanding of Target Heart Rate Range (THRR)  Yes   Intervention  Provide education and explanation of THRR including how the numbers were predicted and where they are located for reference   Expected Outcomes  Long Term: Able to use THRR to govern intensity when exercising independently;Short Term: Able to state/look up THRR;Short  Term: Able to use daily as guideline for intensity in rehab   Able to check pulse independently  Yes   Intervention  Provide education and demonstration on how to check pulse in carotid and radial arteries.;Review the importance of being able to check your own pulse for safety during independent exercise   Expected Outcomes  Short Term: Able to explain why pulse checking is important during independent exercise;Long Term: Able to check pulse independently and accurately   Understanding of Exercise Prescription  Yes   Intervention  Provide education, explanation, and written materials on patient's individual exercise prescription   Expected Outcomes  Short Term: Able to explain program exercise prescription;Long Term: Able to explain home exercise prescription to exercise independently          Exercise Goals Re-Evaluation :    Discharge Exercise Prescription (Final Exercise Prescription Changes): Exercise Prescription Changes - 11/18/17 1400    Response to Exercise          Blood Pressure (Admit)  112/70    Blood Pressure (Exercise)  128/78    Blood Pressure (Exit)  100/70    Heart Rate (Admit)  84 bpm    Heart Rate (Exercise)  109 bpm    Heart Rate (Exit)  91 bpm    Rating of Perceived Exertion (Exercise)  11    Symptoms  non    Comments  pt responded well to first exercise session    Duration  Continue with 30 min of aerobic exercise without signs/symptoms of physical distress.    Intensity  THRR unchanged        Progression          Progression  Continue to progress workloads to maintain intensity without signs/symptoms of physical distress.    Average METs  3.1        Resistance Training          Training Prescription  Yes    Weight  4lbs    Reps  10-15    Time  10 Minutes        Treadmill          MPH  3    Grade  1    Minutes  15    METs  3.71        NuStep          Level  4    SPM  80    Minutes  15    METs  2.4           Nutrition:  Target  Goals: Understanding of nutrition guidelines, daily intake of sodium 1500mg , cholesterol 200mg , calories 30% from fat and 7% or less from saturated fats, daily to have 5 or more servings of fruits and vegetables.  Biometrics: Pre Biometrics - 11/12/17 1518    Pre Biometrics          Height  5\' 8"  (1.727 m)    Weight  173 lb 1 oz (78.5 kg)    Waist Circumference  36.5 inches    Hip Circumference  40 inches    Waist to Hip Ratio  0.91 %    BMI (Calculated)  26.32    Triceps Skinfold  16 mm    % Body Fat  25.8 %    Grip Strength  46 kg    Flexibility  9 in    Single Leg Stand  28 seconds            Nutrition Therapy Plan and Nutrition Goals: Nutrition Therapy & Goals - 11/12/17 1516    Nutrition Therapy          Diet  Heart Healthy        Personal Nutrition Goals          Nutrition Goal  Pt to identify food quantities necessary to maintain weight loss at graduation from cardiac rehab.        Intervention Plan          Intervention  Prescribe, educate and counsel regarding individualized specific dietary modifications aiming towards targeted core components such as weight, hypertension, lipid management, diabetes, heart failure and other comorbidities.;Nutrition handout(s) given to patient.    Expected Outcomes  Short Term Goal: Understand basic principles of dietary content, such as calories, fat, sodium, cholesterol and nutrients.;Long Term Goal: Adherence to prescribed nutrition plan.           Nutrition Assessments: Nutrition Assessments - 11/12/17 1517    MEDFICTS Scores          Pre Score  33           Nutrition Goals Re-Evaluation:   Nutrition Goals Re-Evaluation:   Nutrition Goals Discharge (Final Nutrition Goals Re-Evaluation):   Psychosocial: Target Goals: Acknowledge presence or absence of significant depression and/or stress, maximize coping skills, provide positive support system. Participant is able to verbalize types and ability to use  techniques and skills needed for reducing stress and depression.  Initial Review & Psychosocial Screening: Initial Psych Review & Screening - 11/20/17 1716    Initial Review          Current issues with  None Identified        Family Dynamics          Good Support System?  Yes  (Significant)  spouse        Barriers          Psychosocial barriers to participate in program  There are no identifiable barriers or psychosocial needs.        Screening Interventions          Interventions  Encouraged to exercise           Quality of Life Scores: Quality of Life - 11/12/17 1552    Quality of Life Scores          Health/Function Pre  27.57 %    Socioeconomic Pre  25.42 %    Psych/Spiritual Pre  23 %    Family  Pre  30 %    GLOBAL Pre  26.55 %          Scores of 19 and below usually indicate a poorer quality of life in these areas.  A difference of  2-3 points is a clinically meaningful difference.  A difference of 2-3 points in the total score of the Quality of Life Index has been associated with significant improvement in overall quality of life, self-image, physical symptoms, and general health in studies assessing change in quality of life.  PHQ-9: Recent Review Flowsheet Data    Depression screen [facility] 2/9 11/18/2017 04/24/2017 03/18/2017 08/03/2016 04/03/2016   Decreased Interest 0 0 3 0 0   Down, Depressed, Hopeless 0 0 0 0 0   PHQ - 2 Score 0 0 3 0 0   Altered sleeping - 0 0 0 -   Tired, decreased energy - 1 1 0 -   Change in appetite - 0 0 1 -   Feeling bad or failure about yourself  - 0 0 0 -   Trouble concentrating - 0 3 0 -   Moving slowly or fidgety/restless - 0 0 0 -   Suicidal thoughts - 0 0 0 -   PHQ-9 Score - 1 7 1  -     Interpretation of Total Score  Total Score Depression Severity:  1-4 = Minimal depression, 5-9 = Mild depression, 10-14 = Moderate depression, 15-19 = Moderately severe depression, 20-27 = Severe depression   Psychosocial Evaluation and  Intervention: Psychosocial Evaluation - 11/20/17 1717    Psychosocial Evaluation & Interventions          Interventions  Encouraged to exercise with the program and follow exercise prescription    Comments  no psychosocial needs identified, no interventions necessary.     Expected Outcomes  pt will exhibit positive outlook with good coping skills.     Continue Psychosocial Services   No Follow up required           Psychosocial Re-Evaluation:   Psychosocial Discharge (Final Psychosocial Re-Evaluation):   Vocational Rehabilitation: Provide vocational rehab assistance to qualifying candidates.   Vocational Rehab Evaluation & Intervention: Vocational Rehab - 11/21/17 0716    Initial Vocational Rehab Evaluation & Intervention          Assessment shows need for Vocational Rehabilitation  No           Education: Education Goals: Education classes will be provided on a weekly basis, covering required topics. Participant will state understanding/return demonstration of topics presented.  Learning Barriers/Preferences: Learning Barriers/Preferences - 11/12/17 1427    Learning Barriers/Preferences          Learning Barriers  Sight    Learning Preferences  Written Material;Skilled Demonstration;Pictoral           Education Topics: Count Your Pulse:  -Group instruction provided by verbal instruction, demonstration, patient participation and written materials to support subject.  Instructors address importance of being able to find your pulse and how to count your pulse when at home without a heart monitor.  Patients get hands on experience counting their pulse with staff help and individually.   Heart Attack, Angina, and Risk Factor Modification:  -Group instruction provided by verbal instruction, video, and written materials to support subject.  Instructors address signs and symptoms of angina and heart attacks.    Also discuss risk factors for heart disease and how to make  changes to improve heart health risk factors.   Functional Fitness:  -  Group instruction provided by verbal instruction, demonstration, patient participation, and written materials to support subject.  Instructors address safety measures for doing things around the house.  Discuss how to get up and down off the floor, how to pick things up properly, how to safely get out of a chair without assistance, and balance training.   Meditation and Mindfulness:  -Group instruction provided by verbal instruction, patient participation, and written materials to support subject.  Instructor addresses importance of mindfulness and meditation practice to help reduce stress and improve awareness.  Instructor also leads participants through a meditation exercise.    Stretching for Flexibility and Mobility:  -Group instruction provided by verbal instruction, patient participation, and written materials to support subject.  Instructors lead participants through series of stretches that are designed to increase flexibility thus improving mobility.  These stretches are additional exercise for major muscle groups that are typically performed during regular warm up and cool down.   Hands Only CPR:  -Group verbal, video, and participation provides a basic overview of AHA guidelines for community CPR. Role-play of emergencies allow participants the opportunity to practice calling for help and chest compression technique with discussion of AED use.   Hypertension: -Group verbal and written instruction that provides a basic overview of hypertension including the most recent diagnostic guidelines, risk factor reduction with self-care instructions and medication management.    Nutrition I class: Heart Healthy Eating:  -Group instruction provided by PowerPoint slides, verbal discussion, and written materials to support subject matter. The instructor gives an explanation and review of the Therapeutic Lifestyle Changes  diet recommendations, which includes a discussion on lipid goals, dietary fat, sodium, fiber, plant stanol/sterol esters, sugar, and the components of a well-balanced, healthy diet.   Nutrition II class: Lifestyle Skills:  -Group instruction provided by PowerPoint slides, verbal discussion, and written materials to support subject matter. The instructor gives an explanation and review of label reading, grocery shopping for heart health, heart healthy recipe modifications, and ways to make healthier choices when eating out.   Diabetes Question & Answer:  -Group instruction provided by PowerPoint slides, verbal discussion, and written materials to support subject matter. The instructor gives an explanation and review of diabetes co-morbidities, pre- and post-prandial blood glucose goals, pre-exercise blood glucose goals, signs, symptoms, and treatment of hypoglycemia and hyperglycemia, and foot care basics.   Diabetes Blitz:  -Group instruction provided by PowerPoint slides, verbal discussion, and written materials to support subject matter. The instructor gives an explanation and review of the physiology behind type 1 and type 2 diabetes, diabetes medications and rational behind using different medications, pre- and post-prandial blood glucose recommendations and Hemoglobin A1c goals, diabetes diet, and exercise including blood glucose guidelines for exercising safely.    Portion Distortion:  -Group instruction provided by PowerPoint slides, verbal discussion, written materials, and food models to support subject matter. The instructor gives an explanation of serving size versus portion size, changes in portions sizes over the last 20 years, and what consists of a serving from each food group.   Stress Management:  -Group instruction provided by verbal instruction, video, and written materials to support subject matter.  Instructors review role of stress in heart disease and how to cope with  stress positively.     Exercising on Your Own:  -Group instruction provided by verbal instruction, power point, and written materials to support subject.  Instructors discuss benefits of exercise, components of exercise, frequency and intensity of exercise, and end points for exercise.  Also discuss use of nitroglycerin and activating EMS.  Review options of places to exercise outside of rehab.  Review guidelines for sex with heart disease.   Cardiac Drugs I:  -Group instruction provided by verbal instruction and written materials to support subject.  Instructor reviews cardiac drug classes: antiplatelets, anticoagulants, beta blockers, and statins.  Instructor discusses reasons, side effects, and lifestyle considerations for each drug class.   Cardiac Drugs II:  -Group instruction provided by verbal instruction and written materials to support subject.  Instructor reviews cardiac drug classes: angiotensin converting enzyme inhibitors (ACE-I), angiotensin II receptor blockers (ARBs), nitrates, and calcium channel blockers.  Instructor discusses reasons, side effects, and lifestyle considerations for each drug class.   Anatomy and Physiology of the Circulatory System:  Group verbal and written instruction and models provide basic cardiac anatomy and physiology, with the coronary electrical and arterial systems. Review of: AMI, Angina, Valve disease, Heart Failure, Peripheral Artery Disease, Cardiac Arrhythmia, Pacemakers, and the ICD.   Other Education:  -Group or individual verbal, written, or video instructions that support the educational goals of the cardiac rehab program.   Holiday Eating Survival Tips:  -Group instruction provided by PowerPoint slides, verbal discussion, and written materials to support subject matter. The instructor gives patients tips, tricks, and techniques to help them not only survive but enjoy the holidays despite the onslaught of food that accompanies the  holidays.   Knowledge Questionnaire Score: Knowledge Questionnaire Score - 11/20/17 1716    Knowledge Questionnaire Score          Pre Score  19/24           Core Components/Risk Factors/Patient Goals at Admission: Personal Goals and Risk Factors at Admission - 11/12/17 1522    Core Components/Risk Factors/Patient Goals on Admission           Weight Management  Weight Maintenance    Intervention  Weight Management: Develop a combined nutrition and exercise program designed to reach desired caloric intake, while maintaining appropriate intake of nutrient and fiber, sodium and fats, and appropriate energy expenditure required for the weight goal.;Weight Management: Provide education and appropriate resources to help participant work on and attain dietary goals.    Expected Outcomes  Weight Maintenance: Understanding of the daily nutrition guidelines, which includes 25-35% calories from fat, 7% or less cal from saturated fats, less than 200mg  cholesterol, less than 1.5gm of sodium, & 5 or more servings of fruits and vegetables daily    Hypertension  Yes    Intervention  Provide education on lifestyle modifcations including regular physical activity/exercise, weight management, moderate sodium restriction and increased consumption of fresh fruit, vegetables, and low fat dairy, alcohol moderation, and smoking cessation.;Monitor prescription use compliance.    Expected Outcomes  Short Term: Continued assessment and intervention until BP is < 140/16mm HG in hypertensive participants. < 130/73mm HG in hypertensive participants with diabetes, heart failure or chronic kidney disease.;Long Term: Maintenance of blood pressure at goal levels.    Lipids  Yes    Intervention  Provide education and support for participant on nutrition & aerobic/resistive exercise along with prescribed medications to achieve LDL 70mg , HDL >40mg .    Expected Outcomes  Short Term: Participant states understanding of desired  cholesterol values and is compliant with medications prescribed. Participant is following exercise prescription and nutrition guidelines.;Long Term: Cholesterol controlled with medications as prescribed, with individualized exercise RX and with personalized nutrition plan. Value goals: LDL < 70mg , HDL > 40 mg.  Core Components/Risk Factors/Patient Goals Review:  Goals and Risk Factor Review    Core Components/Risk Factors/Patient Goals Review    Row Name 11/20/17 1715   Personal Goals Review  Weight Management/Obesity;Hypertension;Lipids   Review  pt with multiple CAD RF demonstrates eagerness to participate in CR program.    Expected Outcomes  pt will participate in CR exercise, nutrition and lifestyle modification to decrease overall RF.           Core Components/Risk Factors/Patient Goals at Discharge (Final Review):  Goals and Risk Factor Review - 11/20/17 1715    Core Components/Risk Factors/Patient Goals Review          Personal Goals Review  Weight Management/Obesity;Hypertension;Lipids    Review  pt with multiple CAD RF demonstrates eagerness to participate in CR program.     Expected Outcomes  pt will participate in CR exercise, nutrition and lifestyle modification to decrease overall RF.            ITP Comments: ITP Comments    Row Name 11/12/17 1343 11/20/17 1704   ITP Comments  Dr. Armanda Magic, Medical Director   30 day ITP review. pt demonstrates willingness to participate in cardiac rehab program.       Comments:  Deveron Furlong, RN, BSN Cardiac Pulmonary Rehab 11/21/17 3:17 PM

## 2017-11-22 ENCOUNTER — Encounter (HOSPITAL_COMMUNITY)
Admission: RE | Admit: 2017-11-22 | Discharge: 2017-11-22 | Disposition: A | Discharge status: Home / Self Care | Payer: Medicare Other | Source: Ambulatory Visit | Attending: Cardiology | Admitting: Cardiology

## 2017-11-22 DIAGNOSIS — Z48812 Encounter for surgical aftercare following surgery on the circulatory system: Secondary | ICD-10-CM | POA: Diagnosis not present

## 2017-11-22 DIAGNOSIS — Z952 Presence of prosthetic heart valve: Secondary | ICD-10-CM

## 2017-11-22 DIAGNOSIS — Z951 Presence of aortocoronary bypass graft: Secondary | ICD-10-CM

## 2017-11-25 ENCOUNTER — Encounter (HOSPITAL_COMMUNITY)
Admission: RE | Admit: 2017-11-25 | Discharge: 2017-11-25 | Disposition: A | Discharge status: Home / Self Care | Payer: Medicare Other | Source: Ambulatory Visit | Attending: Cardiology | Admitting: Cardiology

## 2017-11-25 DIAGNOSIS — Z952 Presence of prosthetic heart valve: Secondary | ICD-10-CM

## 2017-11-25 DIAGNOSIS — Z48812 Encounter for surgical aftercare following surgery on the circulatory system: Secondary | ICD-10-CM | POA: Diagnosis not present

## 2017-11-25 DIAGNOSIS — Z951 Presence of aortocoronary bypass graft: Secondary | ICD-10-CM

## 2017-11-27 ENCOUNTER — Encounter (HOSPITAL_COMMUNITY)
Admission: RE | Admit: 2017-11-27 | Discharge: 2017-11-27 | Disposition: A | Discharge status: Home / Self Care | Payer: Medicare Other | Source: Ambulatory Visit | Attending: Cardiology | Admitting: Cardiology

## 2017-11-27 DIAGNOSIS — Z952 Presence of prosthetic heart valve: Secondary | ICD-10-CM

## 2017-11-27 DIAGNOSIS — Z48812 Encounter for surgical aftercare following surgery on the circulatory system: Secondary | ICD-10-CM | POA: Diagnosis not present

## 2017-11-27 DIAGNOSIS — Z951 Presence of aortocoronary bypass graft: Secondary | ICD-10-CM

## 2017-11-28 NOTE — Progress Notes (Signed)
Reviewed home exercise with pt today.  Pt plans to walk for exercise, 2-3x/week in addition to coming to cardiac rehab.  Reviewed THR, pulse, RPE, sign and symptoms, and when to call 911 or MD.  Also discussed weather considerations and indoor options.  Pt voiced understanding.        Vitaliy Eisenhour [employer],MS,ASCM RCEP

## 2017-11-29 ENCOUNTER — Encounter (HOSPITAL_COMMUNITY)
Admission: RE | Admit: 2017-11-29 | Discharge: 2017-11-29 | Disposition: A | Discharge status: Home / Self Care | Payer: Medicare Other | Source: Ambulatory Visit | Attending: Cardiology | Admitting: Cardiology

## 2017-11-29 DIAGNOSIS — Z951 Presence of aortocoronary bypass graft: Secondary | ICD-10-CM

## 2017-11-29 DIAGNOSIS — Z48812 Encounter for surgical aftercare following surgery on the circulatory system: Secondary | ICD-10-CM | POA: Diagnosis not present

## 2017-11-29 DIAGNOSIS — Z952 Presence of prosthetic heart valve: Secondary | ICD-10-CM

## 2017-12-02 ENCOUNTER — Encounter (HOSPITAL_COMMUNITY)
Admission: RE | Admit: 2017-12-02 | Discharge: 2017-12-02 | Disposition: A | Discharge status: Home / Self Care | Payer: Medicare Other | Source: Ambulatory Visit | Attending: Cardiology | Admitting: Cardiology

## 2017-12-02 DIAGNOSIS — Z952 Presence of prosthetic heart valve: Secondary | ICD-10-CM

## 2017-12-02 DIAGNOSIS — Z951 Presence of aortocoronary bypass graft: Secondary | ICD-10-CM

## 2017-12-02 DIAGNOSIS — Z48812 Encounter for surgical aftercare following surgery on the circulatory system: Secondary | ICD-10-CM | POA: Diagnosis not present

## 2017-12-04 ENCOUNTER — Encounter (HOSPITAL_COMMUNITY)
Admission: RE | Admit: 2017-12-04 | Discharge: 2017-12-04 | Disposition: A | Discharge status: Home / Self Care | Payer: Medicare Other | Source: Ambulatory Visit | Attending: Cardiology | Admitting: Cardiology

## 2017-12-04 DIAGNOSIS — Z48812 Encounter for surgical aftercare following surgery on the circulatory system: Secondary | ICD-10-CM | POA: Insufficient documentation

## 2017-12-04 DIAGNOSIS — Z952 Presence of prosthetic heart valve: Secondary | ICD-10-CM | POA: Diagnosis present

## 2017-12-04 DIAGNOSIS — Z951 Presence of aortocoronary bypass graft: Secondary | ICD-10-CM

## 2017-12-05 ENCOUNTER — Ambulatory Visit (HOSPITAL_COMMUNITY): Payer: Medicare Other

## 2017-12-06 ENCOUNTER — Encounter (HOSPITAL_COMMUNITY)
Admission: RE | Admit: 2017-12-06 | Discharge: 2017-12-06 | Disposition: A | Discharge status: Home / Self Care | Payer: Medicare Other | Source: Ambulatory Visit | Attending: Cardiology | Admitting: Cardiology

## 2017-12-06 ENCOUNTER — Encounter (HOSPITAL_COMMUNITY): Payer: Self-pay

## 2017-12-06 VITALS — Ht 68.0 in | Wt 173.5 lb

## 2017-12-06 DIAGNOSIS — Z48812 Encounter for surgical aftercare following surgery on the circulatory system: Secondary | ICD-10-CM | POA: Diagnosis not present

## 2017-12-06 DIAGNOSIS — Z952 Presence of prosthetic heart valve: Secondary | ICD-10-CM

## 2017-12-06 DIAGNOSIS — Z951 Presence of aortocoronary bypass graft: Secondary | ICD-10-CM

## 2017-12-09 ENCOUNTER — Encounter (HOSPITAL_COMMUNITY): Payer: Medicare Other

## 2017-12-11 ENCOUNTER — Encounter (HOSPITAL_COMMUNITY): Payer: Medicare Other

## 2017-12-13 ENCOUNTER — Encounter (HOSPITAL_COMMUNITY): Payer: Medicare Other

## 2017-12-16 ENCOUNTER — Encounter (HOSPITAL_COMMUNITY): Payer: Medicare Other

## 2017-12-18 ENCOUNTER — Encounter (HOSPITAL_COMMUNITY): Payer: Medicare Other

## 2017-12-20 ENCOUNTER — Encounter (HOSPITAL_COMMUNITY): Payer: Medicare Other

## 2017-12-23 ENCOUNTER — Encounter (HOSPITAL_COMMUNITY): Payer: Medicare Other

## 2017-12-25 ENCOUNTER — Encounter (HOSPITAL_COMMUNITY): Payer: Medicare Other

## 2017-12-27 ENCOUNTER — Encounter (HOSPITAL_COMMUNITY): Payer: Medicare Other

## 2018-01-01 ENCOUNTER — Encounter (HOSPITAL_COMMUNITY): Payer: Medicare Other

## 2018-01-02 ENCOUNTER — Encounter: Payer: Self-pay | Admitting: Cardiology

## 2018-01-02 ENCOUNTER — Ambulatory Visit: Payer: Medicare Other | Admitting: Cardiology

## 2018-01-02 VITALS — BP 124/72 | HR 67 | Ht 68.0 in | Wt 173.0 lb

## 2018-01-02 DIAGNOSIS — Z9889 Other specified postprocedural states: Secondary | ICD-10-CM

## 2018-01-02 DIAGNOSIS — Z951 Presence of aortocoronary bypass graft: Secondary | ICD-10-CM

## 2018-01-02 DIAGNOSIS — Z8673 Personal history of transient ischemic attack (TIA), and cerebral infarction without residual deficits: Secondary | ICD-10-CM

## 2018-01-02 DIAGNOSIS — Z952 Presence of prosthetic heart valve: Secondary | ICD-10-CM | POA: Diagnosis not present

## 2018-01-02 DIAGNOSIS — E785 Hyperlipidemia, unspecified: Secondary | ICD-10-CM | POA: Diagnosis not present

## 2018-01-02 NOTE — Progress Notes (Signed)
Cardiology Office Note:    Date:  01/02/2018   ID:  Leonard Padilla, Deloria 1945/11/15, MRN [id number]  PCP:  Carlis Stable, PA-C  Cardiologist:  Garwin Brothers, MD   Referring MD: Donzetta Kohut*    ASSESSMENT:    1. Status post four vessel coronary artery bypass   2. S/P AVR   3. Dyslipidemia, goal LDL below 70   4. Status post carotid endarterectomy   5. Status post CVA    PLAN:    In order of problems listed above:  1. Secondary prevention stressed with the patient.  Importance of compliance with diet and medications stressed and he vocalized understanding.  His blood pressure is stable.  Diet was discussed for dyslipidemia.  Lipids were reviewed too.  He has an excellent exercise pattern and I discussed this with him at length and encouraged him to continue this.  Evaluation today was fine and he will be seen in follow-up appointment in 3 months or earlier if he has any concerns.  He has been advised to come fasting as I will obtain lipid check at that visit.   Medication Adjustments/Labs and Tests Ordered: Current medicines are reviewed at length with the patient today.  Concerns regarding medicines are outlined above.  No orders of the defined types were placed in this encounter.  No orders of the defined types were placed in this encounter.    No chief complaint on file.    History of Present Illness:    Leonard Padilla is a 72 y.o. male he is post CABG surgery, aortic valve replacement with a bioprosthetic valve and carotid endarterectomy.  He denies any problems at this time and takes care of activities of daily living.  No chest pain orthopnea or PND.  He exercises at the gym on a regular basis.  He is here for a routine follow-up.  At the time of my evaluation, the patient is alert awake oriented and in no distress.  Past Medical History:  Diagnosis Date  . Aortic stenosis    moderate AS 12/01/15 echo (Dr. Elberta Fortis)  . Arthritis   . CKD  (chronic kidney disease) stage 3, GFR 30-59 ml/min (HCC) 08/04/2016  . CKD stage G3a/A1, GFR 45-59 and albumin creatinine ratio <30 mg/g (HCC) 08/04/2016  . Coronary artery disease   . Dysthymia 03/18/2017  . 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 Essential hypertension 10/13/2015  . Flu 09/2015   influenza A  . Former smoker, stopped smoking in distant past   . GERD (gastroesophageal reflux disease)    occ  . Heart murmur   . History of colon polyps   . Hyperlipidemia   . 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 Pneumonia 09/2015  . Postural dizziness with presyncope 08/27/2016  . Psoriasis   . Sleep apnea    to pick cpap tomorrow 12/13/15  . Stroke (HCC) 09/2015  . Symptomatic carotid artery stenosis 12/15/2015  . 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 Wears glasses     Past Surgical History:  Procedure Laterality Date  . AORTIC VALVE REPLACEMENT N/A 09/09/2017   Procedure: AORTIC VALVE REPLACEMENT (AVR) using a 23 Edwards Perimount Magna Ease Aortic Valve;  Surgeon: Delight Ovens, MD;  Location: [facility] OR;  Service: Open Heart Surgery;  Laterality: N/A;  . COLONOSCOPY W/ BIOPSIES AND POLYPECTOMY    . CORONARY ARTERY BYPASS GRAFT N/A 09/09/2017   Procedure: CORONARY ARTERY BYPASS GRAFTING (CABG)  x 4 using the left internal mammary artery and right greater saphenous vein harvested endoscopically.;  Surgeon: Delight Ovens, MD;  Location: [facility] OR;  Service: Open Heart Surgery;  Laterality: N/A;  . ENDARTERECTOMY Right 12/15/2015   Procedure: RIGHT CAROTID ENDARTERECTOMY WITH PATCH ANGIOPLASTY;  Surgeon: Nada Libman, MD;  Location: [facility] OR;  Service: Vascular;  Laterality: Right;  . PATCH ANGIOPLASTY Right 12/15/2015   Procedure: RIGHT CAROTID PATCH ANGIOPLASTY;  Surgeon: Nada Libman, MD;  Location: [facility] OR;  Service: Vascular;  Laterality: Right;  . RIGHT/LEFT HEART CATH AND CORONARY ANGIOGRAPHY N/A 08/23/2017   Procedure: RIGHT/LEFT HEART CATH  AND CORONARY ANGIOGRAPHY;  Surgeon: Swaziland, Peter M, MD;  Location: [facility] INVASIVE CV LAB;  Service: Cardiovascular;  Laterality: N/A;  . TEE WITHOUT CARDIOVERSION N/A 09/09/2017   Procedure: TRANSESOPHAGEAL ECHOCARDIOGRAM (TEE);  Surgeon: Delight Ovens, MD;  Location: [facility] OR;  Service: Open Heart Surgery;  Laterality: N/A;  . WISDOM TOOTH EXTRACTION      Current Medications: Current Meds  Medication Sig  . aspirin EC 81 MG tablet Take 1 tablet (81 mg total) by mouth daily.  Marland Kitchen atorvastatin (LIPITOR) 80 MG tablet Take 1 tablet (80 mg total) by mouth at bedtime.  . Clobetasol Propionate (TEMOVATE) 0.05 % external spray Apply 1 spray topically 2 (two) times daily.  . fluticasone (FLONASE) 50 MCG/ACT nasal spray Place into both nostrils daily.  . metoprolol tartrate (LOPRESSOR) 25 MG tablet Take 0.5 tablets (12.5 mg total) by mouth 2 (two) times daily.  . sertraline (ZOLOFT) 25 MG tablet Take 1 tablet (25 mg total) by mouth daily.  . tamsulosin (FLOMAX) 0.4 MG CAPS capsule TAKE 1 CAPSULE DAILY     Allergies:   Viagra [sildenafil citrate]   Social History   Socioeconomic History  . Marital status: Married    Spouse name: [name]  . Number of children: 2  . Years of education: 43  . Highest education level: Not on file  Occupational History  . Occupation: Retired    Comment: retired Clinical biochemist, [employer]  Social Needs  . Financial resource strain: Not on file  . Food insecurity:    Worry: Not on file    Inability: Not on file  . Transportation needs:    Medical: Not on file    Non-medical: Not on file  Tobacco Use  . Smoking status: Former Smoker    Last attempt to quit: 08/07/1983    Years since quitting: 34.4  . Smokeless tobacco: Never Used  Substance and Sexual Activity  . Alcohol use: Yes    Comment: 1 glass of wine daily   . Drug use: No  . Sexual activity: Not Currently  Lifestyle  . Physical activity:    Days per week: Not on file    Minutes per session: Not on file    . Stress: Not on file  Relationships  . Social connections:    Talks on phone: Not on file    Gets together: Not on file    Attends religious service: Not on file    Active member of club or organization: Not on file    Attends meetings of clubs or organizations: Not on file    Relationship status: Not on file  Other Topics Concern  . Not on file  Social History Narrative   Lives with wife   Caffeine use- coffee 3-4 cups daily     Family History: The patient's family history includes Cancer in his father and mother.  ROS:   Please see the history of present illness.    All other systems reviewed and are negative.  EKGs/Labs/Other Studies Reviewed:    The following studies were reviewed today: I discussed the findings with the patient.   Recent Labs: 09/15/2017: TSH 2.891 09/16/2017: Magnesium 1.8 09/24/2017: ALT 41; Hemoglobin 9.2; NT-Pro BNP 1,962; Platelets 452 11/08/2017: BUN 26; Creat 1.29; Potassium 4.7; Sodium 140  Recent Lipid Panel    Component Value Date/Time   CHOL 144 11/08/2017 1334   TRIG 133 11/08/2017 1334   HDL 50 11/08/2017 1334   CHOLHDL 2.9 11/08/2017 1334   VLDL 23 08/03/2016 1115   LDLCALC 73 11/08/2017 1334    Physical Exam:    VS:  BP 124/72 (BP Location: Right Arm, Patient Position: Sitting, Cuff Size: Normal)   Pulse 67   Ht  (1.727 m)   Wt 173 lb 0.6 oz (78.5 kg)   SpO2 97%   BMI 26.31 kg/m     Wt Readings from Last 3 Encounters:  01/02/18 173 lb 0.6 oz (78.5 kg)  12/16/17 173 lb 8 oz (78.7 kg)  11/12/17 173 lb 1 oz (78.5 kg)     GEN: Patient is in no acute distress HEENT: Normal NECK: No JVD; No carotid bruits LYMPHATICS: No lymphadenopathy CARDIAC: Hear sounds regular, 2/6 systolic murmur at the apex. RESPIRATORY:  Clear to auscultation without rales, wheezing or rhonchi  ABDOMEN: Soft, non-tender, non-distended MUSCULOSKELETAL:  No edema; No deformity  SKIN: Warm and dry NEUROLOGIC:  Alert and oriented x  3 PSYCHIATRIC:  Normal affect   Signed, Garwin Brothers, MD  01/02/2018 2:19 PM    [facility]

## 2018-01-02 NOTE — Patient Instructions (Signed)
Medication Instructions:  Your physician recommends that you continue on your current medications as directed. Please refer to the Current Medication list given to you today.  Labwork: None  Testing/Procedures: None  Follow-Up: Your physician recommends that you schedule a follow-up appointment in: 3 months  Any Other Special Instructions Will Be Listed Below (If Applicable).     If you need a refill on your cardiac medications before your next appointment, please call your pharmacy.   [facility]  Ashley A, RN, BSN  

## 2018-01-03 ENCOUNTER — Encounter (HOSPITAL_COMMUNITY): Payer: Medicare Other

## 2018-01-06 ENCOUNTER — Encounter (HOSPITAL_COMMUNITY): Payer: Medicare Other

## 2018-01-07 NOTE — Progress Notes (Signed)
Discharge Progress Report  Patient Details  Name: Leonard Padilla MRN: [id number] Date of Birth: 1946/07/25 Referring Provider:   Flowsheet Row CARDIAC REHAB PHASE II ORIENTATION from 11/12/2017 in [facility] CARDIAC [facility]  Referring Provider  Revankar, Glean Hess MD       Number of Visits: 10   Reason for Discharge:  Patient independent in their exercise.  Smoking History:  Social History   Tobacco Use  Smoking Status Former Smoker  . Last attempt to quit: 08/07/1983  . Years since quitting: 34.4  Smokeless Tobacco Never Used    Diagnosis:  S/P CABG (coronary artery bypass graft)  S/P AVR  ADL UCSD:   Initial Exercise Prescription: Initial Exercise Prescription - 11/12/17 1500    Date of Initial Exercise RX and Referring Provider          Date  11/12/17    Referring Provider  Revankar, Glean Hess MD        Treadmill          MPH  3    Grade  1    Minutes  15    METs  3.71        Bike          Level  1    Minutes  15    METs  3.36        NuStep          Level  4    SPM  80    Minutes  15    METs  3        Prescription Details          Frequency (times per week)  3    Duration  Progress to 30 minutes of continuous aerobic without signs/symptoms of physical distress        Intensity          THRR 40-80% of Max Heartrate  59-118    Ratings of Perceived Exertion  11-13    Perceived Dyspnea  0-4        Progression          Progression  Continue to progress workloads to maintain intensity without signs/symptoms of physical distress.        Resistance Training          Training Prescription  Yes    Weight  4lbs    Reps  10-15           Discharge Exercise Prescription (Final Exercise Prescription Changes): Exercise Prescription Changes - 12/06/17 1230    Response to Exercise          Blood Pressure (Admit)  120/80    Blood Pressure (Exercise)  168/82    Blood Pressure (Exit)  110/64    Heart Rate (Admit)  74 bpm    Heart  Rate (Exercise)  119 bpm    Heart Rate (Exit)  91 bpm    Rating of Perceived Exertion (Exercise)  13    Symptoms  none    Duration  Continue with 30 min of aerobic exercise without signs/symptoms of physical distress.    Intensity  THRR unchanged        Progression          Progression  Continue to progress workloads to maintain intensity without signs/symptoms of physical distress.    Average METs  4.6        Resistance Training          Training Prescription  Yes  Weight  4lbs    Reps  10-15    Time  10 Minutes        Treadmill          MPH  3.2    Grade  3    Minutes  15    METs  4.77        Bike          Level  1.5    Minutes  15    METs  4.58        Home Exercise Plan          Plans to continue exercise at  Home (comment)    Frequency  Add 2 additional days to program exercise sessions.    Initial Home Exercises Provided  11/27/17           Functional Capacity: 6 Minute Walk    6 Minute Walk    Row Name 11/12/17 1512 11/12/17 1519 12/06/17 1341   Phase  Initial  no documentation  Discharge   Distance  2028 feet  no documentation  1988 feet   Distance % Change  no documentation  no documentation  1.97 %   Distance Feet Change  no documentation  no documentation  40 ft   Walk Time  6 minutes  no documentation  6 minutes   # of Rest Breaks  0  no documentation  0   MPH  3.8  no documentation  3.77   METS  4.1  no documentation  4.53   RPE  12  no documentation  13   VO2 Peak  14.4  no documentation  no documentation   Symptoms  Yes (comment)  no documentation  No   Comments  arch in bottom of feet (B)  no documentation  no documentation   Resting HR  78 bpm  no documentation  74 bpm   Resting BP  120/80  no documentation  120/80   Resting Oxygen Saturation   99 %  no documentation  no documentation   Exercise Oxygen Saturation  during 6 min walk  100 %  no documentation  no documentation   Max Ex. HR  110 bpm  no documentation  119 bpm   Max Ex.  BP  122/82  no documentation  168/82   2 Minute Post BP  no documentation  119/70  110/64          Psychological, QOL, Others - Outcomes: PHQ 2/9: Depression screen [facility] 2/9 11/18/2017 04/24/2017 03/18/2017 08/03/2016 04/03/2016  Decreased Interest 0 0 3 0 0  Down, Depressed, Hopeless 0 0 0 0 0  PHQ - 2 Score 0 0 3 0 0  Altered sleeping - 0 0 0 -  Tired, decreased energy - 1 1 0 -  Change in appetite - 0 0 1 -  Feeling bad or failure about yourself  - 0 0 0 -  Trouble concentrating - 0 3 0 -  Moving slowly or fidgety/restless - 0 0 0 -  Suicidal thoughts - 0 0 0 -  PHQ-9 Score - 1 7 1  -    Quality of Life: Quality of Life - 11/12/17 1552    Quality of Life Scores          Health/Function Pre  27.57 %    Socioeconomic Pre  25.42 %    Psych/Spiritual Pre  23 %    Family Pre  30 %    GLOBAL Pre  26.55 %  Personal Goals: Goals established at orientation with interventions provided to work toward goal. Personal Goals and Risk Factors at Admission - 11/12/17 1522    Core Components/Risk Factors/Patient Goals on Admission           Weight Management  Weight Maintenance    Intervention  Weight Management: Develop a combined nutrition and exercise program designed to reach desired caloric intake, while maintaining appropriate intake of nutrient and fiber, sodium and fats, and appropriate energy expenditure required for the weight goal.;Weight Management: Provide education and appropriate resources to help participant work on and attain dietary goals.    Expected Outcomes  Weight Maintenance: Understanding of the daily nutrition guidelines, which includes 25-35% calories from fat, 7% or less cal from saturated fats, less than 200mg  cholesterol, less than 1.5gm of sodium, & 5 or more servings of fruits and vegetables daily    Hypertension  Yes    Intervention  Provide education on lifestyle modifcations including regular physical activity/exercise, weight management, moderate  sodium restriction and increased consumption of fresh fruit, vegetables, and low fat dairy, alcohol moderation, and smoking cessation.;Monitor prescription use compliance.    Expected Outcomes  Short Term: Continued assessment and intervention until BP is < 140/26mm HG in hypertensive participants. < 130/18mm HG in hypertensive participants with diabetes, heart failure or chronic kidney disease.;Long Term: Maintenance of blood pressure at goal levels.    Lipids  Yes    Intervention  Provide education and support for participant on nutrition & aerobic/resistive exercise along with prescribed medications to achieve LDL 70mg , HDL >40mg .    Expected Outcomes  Short Term: Participant states understanding of desired cholesterol values and is compliant with medications prescribed. Participant is following exercise prescription and nutrition guidelines.;Long Term: Cholesterol controlled with medications as prescribed, with individualized exercise RX and with personalized nutrition plan. Value goals: LDL < 70mg , HDL > 40 mg.            Personal Goals Discharge: Goals and Risk Factor Review    Core Components/Risk Factors/Patient Goals Review    Row Name 11/20/17 1715 12/06/17 1426   Personal Goals Review  Weight Management/Obesity;Hypertension;Lipids  Weight Management/Obesity;Hypertension;Lipids   Review  pt with multiple CAD RF demonstrates eagerness to participate in CR program.   pt with multiple CAD RF demonstrates eagerness to participate in CR program. pt exited program early to exercise on his own. pt pleased with increased knowledge of exercise safety and pulse counting. pt verbalizes decreased fear of afib.    Expected Outcomes  pt will participate in CR exercise, nutrition and lifestyle modification to decrease overall RF.   pt will participate in exercise, nutrition and lifestyle modification to decrease overall RF.           Exercise Goals and Review: Exercise Goals    Exercise Goals     Row Name 11/12/17 1428   Increase Physical Activity  Yes   Intervention  Provide advice, education, support and counseling about physical activity/exercise needs.;Develop an individualized exercise prescription for aerobic and resistive training based on initial evaluation findings, risk stratification, comorbidities and participant's personal goals.   Expected Outcomes  Short Term: Attend rehab on a regular basis to increase amount of physical activity.;Long Term: Add in home exercise to make exercise part of routine and to increase amount of physical activity.;Long Term: Exercising regularly at least 3-5 days a week.   Increase Strength and Stamina  Yes improve HS flexibility/ mobility throughout   Intervention  Provide advice, education, support and counseling  about physical activity/exercise needs.;Develop an individualized exercise prescription for aerobic and resistive training based on initial evaluation findings, risk stratification, comorbidities and participant's personal goals.   Expected Outcomes  Short Term: Increase workloads from initial exercise prescription for resistance, speed, and METs.;Short Term: Perform resistance training exercises routinely during rehab and add in resistance training at home;Long Term: Improve cardiorespiratory fitness, muscular endurance and strength as measured by increased METs and functional capacity (6MWT)   Able to understand and use rate of perceived exertion (RPE) scale  Yes   Intervention  Provide education and explanation on how to use RPE scale   Expected Outcomes  Short Term: Able to use RPE daily in rehab to express subjective intensity level;Long Term:  Able to use RPE to guide intensity level when exercising independently   Knowledge and understanding of Target Heart Rate Range (THRR)  Yes   Intervention  Provide education and explanation of THRR including how the numbers were predicted and where they are located for reference   Expected Outcomes   Long Term: Able to use THRR to govern intensity when exercising independently;Short Term: Able to state/look up THRR;Short Term: Able to use daily as guideline for intensity in rehab   Able to check pulse independently  Yes   Intervention  Provide education and demonstration on how to check pulse in carotid and radial arteries.;Review the importance of being able to check your own pulse for safety during independent exercise   Expected Outcomes  Short Term: Able to explain why pulse checking is important during independent exercise;Long Term: Able to check pulse independently and accurately   Understanding of Exercise Prescription  Yes   Intervention  Provide education, explanation, and written materials on patient's individual exercise prescription   Expected Outcomes  Short Term: Able to explain program exercise prescription;Long Term: Able to explain home exercise prescription to exercise independently          Nutrition & Weight - Outcomes: Pre Biometrics - 11/12/17 1518    Pre Biometrics          Height  5\' 8"  (1.727 m)    Weight  173 lb 1 oz (78.5 kg)    Waist Circumference  36.5 inches    Hip Circumference  40 inches    Waist to Hip Ratio  0.91 %    BMI (Calculated)  26.32    Triceps Skinfold  16 mm    % Body Fat  25.8 %    Grip Strength  46 kg    Flexibility  9 in    Single Leg Stand  28 seconds          Post Biometrics - 12/16/17 1122     Post  Biometrics          Height  5\' 8"  (1.727 m)    Weight  173 lb 8 oz (78.7 kg)    Waist Circumference  38 inches    Hip Circumference  39.75 inches    Waist to Hip Ratio  0.96 %    BMI (Calculated)  26.39    Triceps Skinfold  17 mm    % Body Fat  26.8 %    Grip Strength  50 kg    Flexibility  12.5 in    Single Leg Stand  14 seconds           Nutrition: Nutrition Therapy & Goals - 11/12/17 1516    Nutrition Therapy          Diet  Heart Healthy        Personal Nutrition Goals          Nutrition Goal  Pt to  identify food quantities necessary to maintain weight loss at graduation from cardiac rehab.        Intervention Plan          Intervention  Prescribe, educate and counsel regarding individualized specific dietary modifications aiming towards targeted core components such as weight, hypertension, lipid management, diabetes, heart failure and other comorbidities.;Nutrition handout(s) given to patient.    Expected Outcomes  Short Term Goal: Understand basic principles of dietary content, such as calories, fat, sodium, cholesterol and nutrients.;Long Term Goal: Adherence to prescribed nutrition plan.           Nutrition Discharge: Nutrition Assessments - 11/12/17 1517    MEDFICTS Scores          Pre Score  33           Education Questionnaire Score: Knowledge Questionnaire Score - 11/20/17 1716    Knowledge Questionnaire Score          Pre Score  19/24           Goals reviewed with patient; copy given to patient.

## 2018-01-07 NOTE — Addendum Note (Signed)
Encounter addended by: Robyne Peersion, Kimerly Rowand H, RN on: 01/07/2018 7:13 AM  Actions taken: Sign clinical note

## 2018-01-07 NOTE — Addendum Note (Signed)
Encounter addended by: Robyne Peersion, Jenet Durio H, RN on: 01/07/2018 7:16 AM  Actions taken: Episode resolved

## 2018-01-08 ENCOUNTER — Encounter (HOSPITAL_COMMUNITY): Payer: Medicare Other

## 2018-01-10 ENCOUNTER — Encounter (HOSPITAL_COMMUNITY): Payer: Medicare Other

## 2018-01-13 ENCOUNTER — Encounter (HOSPITAL_COMMUNITY): Payer: Medicare Other

## 2018-01-15 ENCOUNTER — Encounter (HOSPITAL_COMMUNITY): Payer: Medicare Other

## 2018-01-16 ENCOUNTER — Encounter: Payer: Self-pay | Admitting: Cardiothoracic Surgery

## 2018-01-16 ENCOUNTER — Other Ambulatory Visit: Payer: Self-pay

## 2018-01-16 ENCOUNTER — Ambulatory Visit: Payer: Medicare Other | Admitting: Cardiothoracic Surgery

## 2018-01-16 VITALS — BP 110/68 | HR 75 | Resp 18 | Ht 68.0 in | Wt 172.8 lb

## 2018-01-16 DIAGNOSIS — Z952 Presence of prosthetic heart valve: Secondary | ICD-10-CM

## 2018-01-16 DIAGNOSIS — Z9889 Other specified postprocedural states: Secondary | ICD-10-CM | POA: Diagnosis not present

## 2018-01-16 NOTE — Progress Notes (Signed)
[address]       [place] [id number]             [phone]      Bertha Stakesaul M [facility] Record #[id number] Date of Birth: [birth year]  Referring: RevankarAundra Dubin, Rajan R, MD Primary Care: Carlis Stableummings, Charley Elizabeth, [place] Primary Cardiologist: No primary care provider on file.   Chief Complaint:   POST OP FOLLOW UP  09/09/2017 OPERATIVE REPORT PREOPERATIVE DIAGNOSIS:  Critical aortic stenosis with severe 3-vessel coronary artery disease, unstable angina. POSTOPERATIVE DIAGNOSIS:  Critical aortic stenosis with severe 3-vessel coronary artery disease, unstable angina. PROCEDURE PERFORMED:  Aortic valve replacement with pericardial tissue valve Edwards Lifesciences 23 mm, model 3300TFX, serial #[id number], and coronary artery bypass grafting x4 with the left internal mammary to the left anterior descending coronary artery, reverse saphenous vein graft to the diagonal coronary artery, reverse saphenous vein graft to the obtuse marginal coronary artery, reverse saphenous vein graft to the posterior descending coronary artery with right greater saphenous thigh and calf endo vein harvesting. SURGEON:  Sheliah PlaneEdward Stclair Szymborski, MD.    History of Present Illness:     Patient returns to the office today after urgent aortic valve replacement and coronary artery bypass grafting February 4 his postoperative course was complicated by a left pleural effusion requiring thoracentesis and a course of diuretics. . He also had some urinary retention while he was in the hospital.  Since last seen his back to the urologist and notes that he is having no further urology problems.  Patient has returned to near normal activities, started in cardiac rehab but is now in the gym on his own. He has had no symptoms of heart failure  He expresses knowledge of need for dental preprocedure prophylaxis with his valve in place    Past Medical History:  Diagnosis Date  . Aortic  stenosis    moderate AS 12/01/15 echo (Dr. Elberta Fortisamnitz)  . Arthritis   . CKD (chronic kidney disease) stage 3, GFR 30-59 ml/min (HCC) 08/04/2016  . CKD stage G3a/A1, GFR 45-59 and albumin creatinine ratio <30 mg/g (HCC) 08/04/2016  . Coronary artery disease   . Dysthymia 03/18/2017  . 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 Essential hypertension 10/13/2015  . Flu 09/2015   influenza A  . Former smoker, stopped smoking in distant past   . GERD (gastroesophageal reflux disease)    occ  . Heart murmur   . History of colon polyps   . Hyperlipidemia   . 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 Pneumonia 09/2015  . Postural dizziness with presyncope 08/27/2016  . Psoriasis   . Sleep apnea    to pick cpap tomorrow 12/13/15  . Stroke (HCC) 09/2015  . Symptomatic carotid artery stenosis 12/15/2015  . 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 Wears glasses      Social History   Tobacco Use  Smoking Status Former Smoker  . Last attempt to quit: 08/07/1983  . Years since quitting: 34.4  Smokeless Tobacco Never Used    Social History   Substance and Sexual Activity  Alcohol Use Yes   Comment: 1 glass of wine daily      Allergies  Allergen Reactions  . Viagra [Sildenafil Citrate] Other (See Comments)    COLOR DISCRIMINATION [DOSE RELATED] "blue sensation"    Current Outpatient Medications  Medication Sig Dispense Refill  . aspirin EC 81 MG tablet Take 1 tablet (81  mg total) by mouth daily. 90 tablet 1  . atorvastatin (LIPITOR) 80 MG tablet Take 1 tablet (80 mg total) by mouth at bedtime. 90 tablet 3  . Clobetasol Propionate (TEMOVATE) 0.05 % external spray Apply 1 spray topically 2 (two) times daily.    . fluticasone (FLONASE) 50 MCG/ACT nasal spray Place into both nostrils daily.    . metoprolol tartrate (LOPRESSOR) 25 MG tablet Take 0.5 tablets (12.5 mg total) by mouth 2 (two) times daily. 90 tablet 1  . sertraline (ZOLOFT) 25 MG tablet Take 1  tablet (25 mg total) by mouth daily. 90 tablet 3  . tamsulosin (FLOMAX) 0.4 MG CAPS capsule TAKE 1 CAPSULE DAILY 90 capsule 0   No current facility-administered medications for this visit.        Physical Exam: BP 110/68 (BP Location: Right Arm, Patient Position: Sitting, Cuff Size: Normal)   Pulse 75   Resp 18   Ht 5\' 8"  (1.727 m)   Wt 172 lb 12.8 oz (78.4 kg)   SpO2 96% Comment: RA  BMI 26.27 kg/m  General appearance: alert and cooperative Head: Normocephalic, without obvious abnormality, atraumatic Neck: no adenopathy, no carotid bruit, no JVD, supple, symmetrical, trachea midline and thyroid not enlarged, symmetric, no tenderness/mass/nodules Lymph nodes: Cervical, supraclavicular, and axillary nodes normal. Back: symmetric, no curvature. ROM normal. No CVA tenderness. Cardio: regular rate and rhythm, S1, S2 normal, no murmur, click, rub or gallop GI: soft, non-tender; bowel sounds normal; no masses,  no organomegaly Extremities: extremities normal, atraumatic, no cyanosis or edema and Homans sign is negative, no sign of DVT Neurologic: Grossly normal   Diagnostic Studies & Laboratory data:     Recent Radiology Findings:  Dg Chest 2 View  Result Date: 10/17/2017 CLINICAL DATA:  Post aortic valve replacement on 09/09/2017 EXAM: CHEST - 2 VIEW COMPARISON:  Chest x-ray of 10/02/2017 FINDINGS: Linear atelectasis and/or scarring remains in the left mid lung and left lung base. Only a small left pleural effusion is present. Mediastinal and hilar contours are unremarkable. Mild cardiomegaly is stable. Aortic valve replacement is noted. Also changes of CABG are present. IMPRESSION: 1. Small left pleural effusion remains. 2. Little change in linear atelectasis at the left mid lung base. Electronically Signed   By: Dwyane Dee M.D.   On: 10/17/2017 14:12    I have independently reviewed the above radiology studies  and reviewed the findings with the patient. Recent Lab Findings: Lab  Results  Component Value Date   WBC 10.2 09/24/2017   HGB 9.2 (L) 09/24/2017   HCT 28.3 (L) 09/24/2017   PLT 452 (H) 09/24/2017   GLUCOSE 87 11/08/2017   CHOL 144 11/08/2017   TRIG 133 11/08/2017   HDL 50 11/08/2017   LDLCALC 73 11/08/2017   ALT 41 09/24/2017   AST 19 09/24/2017   NA 140 11/08/2017   K 4.7 11/08/2017   CL 106 11/08/2017   CREATININE 1.29 (H) 11/08/2017   BUN 26 (H) 11/08/2017   CO2 24 11/08/2017   TSH 2.891 09/15/2017   INR 1.60 09/09/2017   HGBA1C 5.5 09/04/2017      Assessment / Plan:    Patient status post coronary artery bypass grafting and aortic valve replacement done urgently for critical left ear and three-vessel left main coronary artery disease.  The patient is making excellent progress postoperatively without symptoms of heart failure or recurrent angina  Plan to see him back as needed, currently followed closely by cardiology.   Delight Ovens  MD      [address] [place] [address] Office [phone]   Beeper [phone]  01/16/2018 3:29 PM

## 2018-01-17 ENCOUNTER — Encounter (HOSPITAL_COMMUNITY): Payer: Medicare Other

## 2018-01-20 ENCOUNTER — Encounter (HOSPITAL_COMMUNITY): Payer: Medicare Other

## 2018-01-22 ENCOUNTER — Encounter (HOSPITAL_COMMUNITY): Payer: Medicare Other

## 2018-01-24 ENCOUNTER — Encounter (HOSPITAL_COMMUNITY): Payer: Medicare Other

## 2018-01-27 ENCOUNTER — Encounter (HOSPITAL_COMMUNITY): Payer: Medicare Other

## 2018-01-29 ENCOUNTER — Encounter (HOSPITAL_COMMUNITY): Payer: Medicare Other

## 2018-01-31 ENCOUNTER — Encounter (HOSPITAL_COMMUNITY): Payer: Medicare Other

## 2018-02-03 ENCOUNTER — Encounter (HOSPITAL_COMMUNITY): Payer: Medicare Other

## 2018-02-05 ENCOUNTER — Encounter (HOSPITAL_COMMUNITY): Payer: Medicare Other

## 2018-02-07 ENCOUNTER — Encounter (HOSPITAL_COMMUNITY): Payer: Medicare Other

## 2018-02-10 ENCOUNTER — Encounter (HOSPITAL_COMMUNITY): Payer: Medicare Other

## 2018-02-12 ENCOUNTER — Encounter (HOSPITAL_COMMUNITY): Payer: Medicare Other

## 2018-02-14 ENCOUNTER — Encounter (HOSPITAL_COMMUNITY): Payer: Medicare Other

## 2018-02-16 ENCOUNTER — Other Ambulatory Visit: Payer: Self-pay | Admitting: Physician Assistant

## 2018-02-16 DIAGNOSIS — F341 Dysthymic disorder: Secondary | ICD-10-CM

## 2018-02-16 DIAGNOSIS — R454 Irritability and anger: Secondary | ICD-10-CM

## 2018-02-17 ENCOUNTER — Encounter (HOSPITAL_COMMUNITY): Payer: Medicare Other

## 2018-02-19 ENCOUNTER — Encounter (HOSPITAL_COMMUNITY): Payer: Medicare Other

## 2018-02-21 ENCOUNTER — Encounter (HOSPITAL_COMMUNITY): Payer: Medicare Other

## 2018-02-24 ENCOUNTER — Encounter (HOSPITAL_COMMUNITY): Payer: Medicare Other

## 2018-02-26 ENCOUNTER — Encounter (HOSPITAL_COMMUNITY): Payer: Medicare Other

## 2018-02-28 ENCOUNTER — Encounter (HOSPITAL_COMMUNITY): Payer: Medicare Other

## 2018-03-03 ENCOUNTER — Encounter (HOSPITAL_COMMUNITY): Payer: Medicare Other

## 2018-03-05 ENCOUNTER — Encounter (HOSPITAL_COMMUNITY): Payer: Medicare Other

## 2018-03-07 ENCOUNTER — Encounter (HOSPITAL_COMMUNITY): Payer: Medicare Other

## 2018-03-08 ENCOUNTER — Other Ambulatory Visit: Payer: Self-pay | Admitting: Physician Assistant

## 2018-03-08 ENCOUNTER — Encounter: Payer: Self-pay | Admitting: Physician Assistant

## 2018-03-10 ENCOUNTER — Encounter (HOSPITAL_COMMUNITY): Payer: Medicare Other

## 2018-03-10 MED ORDER — TAMSULOSIN HCL 0.4 MG PO CAPS: 0.4000 mg | ORAL_CAPSULE | Freq: Every day | ORAL | 0 refills | Status: DC

## 2018-03-10 NOTE — Addendum Note (Signed)
Addended by: Collie SiadICHARDSON, Rima Blizzard M on: 03/10/2018 10:29 AM   Modules accepted: Orders

## 2018-03-12 ENCOUNTER — Encounter (HOSPITAL_COMMUNITY): Payer: Medicare Other

## 2018-03-22 ENCOUNTER — Encounter: Payer: Self-pay | Admitting: Physician Assistant

## 2018-03-24 ENCOUNTER — Other Ambulatory Visit: Payer: Self-pay | Admitting: *Deleted

## 2018-03-24 ENCOUNTER — Telehealth: Payer: Self-pay | Admitting: *Deleted

## 2018-03-24 ENCOUNTER — Encounter: Payer: Self-pay | Admitting: Physician Assistant

## 2018-03-24 MED ORDER — AMOXICILLIN 500 MG PO TABS: 2000.0000 mg | ORAL_TABLET | Freq: Two times a day (BID) | ORAL | 0 refills | Status: DC

## 2018-03-24 NOTE — Telephone Encounter (Signed)
Pt phoned stating needs antibiotic phoned in to take before going to the dentist.  Spoke with Dr. Tomie Chinaevankar and he instructed me to send in Amoxicillin 2000 mg to be taken 30 to 60 minutes before procedure. Left message on pt cell phone per dpr about prescription.

## 2018-03-28 ENCOUNTER — Other Ambulatory Visit: Payer: Self-pay | Admitting: Cardiology

## 2018-04-03 ENCOUNTER — Ambulatory Visit (INDEPENDENT_AMBULATORY_CARE_PROVIDER_SITE_OTHER): Payer: Medicare Other | Admitting: Physician Assistant

## 2018-04-03 DIAGNOSIS — Z23 Encounter for immunization: Secondary | ICD-10-CM

## 2018-04-08 ENCOUNTER — Ambulatory Visit: Payer: Medicare Other | Admitting: Physician Assistant

## 2018-04-09 ENCOUNTER — Ambulatory Visit: Payer: Medicare Other | Admitting: Cardiology

## 2018-04-09 ENCOUNTER — Encounter: Payer: Self-pay | Admitting: Cardiology

## 2018-04-09 VITALS — BP 112/68 | HR 62 | Ht 68.0 in | Wt 173.0 lb

## 2018-04-09 DIAGNOSIS — E785 Hyperlipidemia, unspecified: Secondary | ICD-10-CM

## 2018-04-09 DIAGNOSIS — I2510 Atherosclerotic heart disease of native coronary artery without angina pectoris: Secondary | ICD-10-CM

## 2018-04-09 DIAGNOSIS — Z87891 Personal history of nicotine dependence: Secondary | ICD-10-CM

## 2018-04-09 DIAGNOSIS — Z79899 Other long term (current) drug therapy: Secondary | ICD-10-CM

## 2018-04-09 DIAGNOSIS — Z952 Presence of prosthetic heart valve: Secondary | ICD-10-CM

## 2018-04-09 DIAGNOSIS — I10 Essential (primary) hypertension: Secondary | ICD-10-CM | POA: Diagnosis not present

## 2018-04-09 DIAGNOSIS — Z5181 Encounter for therapeutic drug level monitoring: Secondary | ICD-10-CM

## 2018-04-09 DIAGNOSIS — Z951 Presence of aortocoronary bypass graft: Secondary | ICD-10-CM

## 2018-04-09 DIAGNOSIS — Z8673 Personal history of transient ischemic attack (TIA), and cerebral infarction without residual deficits: Secondary | ICD-10-CM

## 2018-04-09 DIAGNOSIS — Z9889 Other specified postprocedural states: Secondary | ICD-10-CM

## 2018-04-09 NOTE — Progress Notes (Signed)
Cardiology Office Note:    Date:  04/09/2018   ID:  Leonard Padilla, DOB [birth year], MRN [id number]  PCP:  Leonard Stable, PA-C  Cardiologist:  Leonard Brothers, MD   Referring MD: Leonard Padilla*    ASSESSMENT:    1. Coronary artery disease involving native coronary artery of native heart without angina pectoris   2. Hypertension goal BP (blood pressure) < 140/90   3. Dyslipidemia, goal LDL below 70   4. S/P AVR   5. Status post four vessel coronary artery bypass   6. Former smoker, stopped smoking in distant past   7. Encounter for monitoring statin therapy   8. Status post CVA   9. Status post carotid endarterectomy    PLAN:    In order of problems listed above:  1. Secondary prevention stressed with the patient.  Importance of compliance with diet and medication stressed and he vocalized understanding.  His blood pressure is Padilla. 2. Diet was discussed for dyslipidemia.  His last lipids were reviewed by him.  I would like to get him to come back tomorrow morning for blood work I would like to optimize his lipids and he will get blood work including lipid check. 3. Patient will be seen in follow-up appointment in 6 months or earlier if the patient has any concerns    Medication Adjustments/Labs and Tests Ordered: Current medicines are reviewed at length with the patient today.  Concerns regarding medicines are outlined above.  Orders Placed This Encounter  Procedures  . Basic metabolic panel  . CBC with Differential/Platelet  . TSH  . Hepatic function panel  . Lipid panel  . Urinalysis   No orders of the defined types were placed in this encounter.    No chief complaint on file.    History of Present Illness:    Leonard Padilla is a 72 y.o. male patient has undergone aortic valve replacement and is post CABG surgery.  He denies any problems at this time and takes care of activities of daily living.  No chest pain orthopnea or PND.  At the time  of my evaluation, the patient is alert awake oriented and in no distress.  He exercises on a regular basis.  Past Medical History:  Diagnosis Date  . Aortic stenosis    moderate AS 12/01/15 echo (Leonard Padilla)  . Arthritis   . CKD (chronic kidney disease) stage 3, GFR 30-59 ml/min (HCC) 08/04/2016  . CKD stage G3a/A1, GFR 45-59 and albumin creatinine ratio <30 mg/g (HCC) 08/04/2016  . Coronary artery disease   . Dysthymia 03/18/2017  . 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 Essential hypertension 10/13/2015  . Flu 09/2015   influenza A  . Former smoker, stopped smoking in distant past   . GERD (gastroesophageal reflux disease)    occ  . Heart murmur   . History of colon polyps   . Hyperlipidemia   . 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 Pneumonia 09/2015  . Postural dizziness with presyncope 08/27/2016  . Psoriasis   . Sleep apnea    to pick cpap tomorrow 12/13/15  . Stroke (HCC) 09/2015  . Symptomatic carotid artery stenosis 12/15/2015  . 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 Wears glasses     Past Surgical History:  Procedure Laterality Date  . AORTIC VALVE REPLACEMENT N/A 09/09/2017   Procedure: AORTIC VALVE REPLACEMENT (AVR) using a 23 Edwards Perimount Magna Ease Aortic Valve;  Surgeon: Leonard Ovens, MD;  Location: [facility] OR;  Service: Open Heart Surgery;  Laterality: N/A;  . COLONOSCOPY W/ BIOPSIES AND POLYPECTOMY    . CORONARY ARTERY BYPASS GRAFT N/A 09/09/2017   Procedure: CORONARY ARTERY BYPASS GRAFTING (CABG) x 4 using the left internal mammary artery and right greater saphenous vein harvested endoscopically.;  Surgeon: Leonard Ovens, MD;  Location: [facility] OR;  Service: Open Heart Surgery;  Laterality: N/A;  . ENDARTERECTOMY Right 12/15/2015   Procedure: RIGHT CAROTID ENDARTERECTOMY WITH PATCH ANGIOPLASTY;  Surgeon: Leonard Libman, MD;  Location: [facility] OR;  Service: Vascular;  Laterality: Right;  . PATCH ANGIOPLASTY Right 12/15/2015    Procedure: RIGHT CAROTID PATCH ANGIOPLASTY;  Surgeon: Leonard Libman, MD;  Location: [facility] OR;  Service: Vascular;  Laterality: Right;  . RIGHT/LEFT HEART CATH AND CORONARY ANGIOGRAPHY N/A 08/23/2017   Procedure: RIGHT/LEFT HEART CATH AND CORONARY ANGIOGRAPHY;  Surgeon: Swazil[place], Peter M, MD;  Location: [facility] INVASIVE CV LAB;  Service: Cardiovascular;  Laterality: N/A;  . TEE WITHOUT CARDIOVERSION N/A 09/09/2017   Procedure: TRANSESOPHAGEAL ECHOCARDIOGRAM (TEE);  Surgeon: Leonard Ovens, MD;  Location: [facility] OR;  Service: Open Heart Surgery;  Laterality: N/A;  . WISDOM TOOTH EXTRACTION      Current Medications: Current Meds  Medication Sig  . aspirin EC 81 MG tablet Take 1 tablet (81 mg total) by mouth daily.  Marland Kitchen atorvastatin (LIPITOR) 80 MG tablet Take 1 tablet (80 mg total) by mouth at bedtime.  . Clobetasol Propionate (TEMOVATE) 0.05 % external spray Apply 1 spray topically 2 (two) times daily.  . fluticasone (FLONASE) 50 MCG/ACT nasal spray Place into both nostrils daily.  . metoprolol tartrate (LOPRESSOR) 25 MG tablet TAKE 1/2 TABLET(12.5 MG) BY MOUTH TWICE DAILY  . sertraline (ZOLOFT) 25 MG tablet Take 1 tablet (25 mg total) by mouth daily.  . tamsulosin (FLOMAX) 0.4 MG CAPS capsule Take 1 capsule (0.4 mg total) by mouth daily.  . [DISCONTINUED] amoxicillin (AMOXIL) 500 MG tablet Take 4 tablets (2,000 mg total) by mouth 2 (two) times daily. Take 4 tablets (2000 mg) 30 to 60 minutes before dental appointment.     Allergies:   Viagra [sildenafil citrate]   Social History   Socioeconomic History  . Marital status: Married    Spouse name: Leonard Padilla  . Number of children: 2  . Years of education: 41  . Highest education level: Not on file  Occupational History  . Occupation: Retired    Comment: retired Clinical biochemist, [employer]  Social Needs  . Financial resource strain: Not on file  . Food insecurity:    Worry: Not on file    Inability: Not on file  . Transportation needs:    Medical: Not on  file    Non-medical: Not on file  Tobacco Use  . Smoking status: Former Smoker    Last attempt to quit: 08/07/1983    Years since quitting: 34.6  . Smokeless tobacco: Never Used  Substance and Sexual Activity  . Alcohol use: Yes    Comment: 1 glass of wine daily   . Drug use: No  . Sexual activity: Not Currently  Lifestyle  . Physical activity:    Days per week: Not on file    Minutes per session: Not on file  . Stress: Not on file  Relationships  . Social connections:    Talks on phone: Not on file    Gets together: Not on file    Attends religious service: Not on file  Active member of club or organization: Not on file    Attends meetings of clubs or organizations: Not on file    Relationship status: Not on file  Other Topics Concern  . Not on file  Social History Narrative   Lives with wife   Caffeine use- coffee 3-4 cups daily     Family History: The patient's family history includes Cancer in his father and mother.  ROS:   Please see the history of present illness.    All other systems reviewed and are negative.  EKGs/Labs/Other Studies Reviewed:    The following studies were reviewed today: I discussed my findings with the patient at extensive length.   Recent Labs: 09/15/2017: TSH 2.891 09/16/2017: Magnesium 1.8 09/24/2017: ALT 41; Hemoglobin 9.2; NT-Pro BNP 1,962; Platelets 452 11/08/2017: BUN 26; Creat 1.29; Potassium 4.7; Sodium 140  Recent Lipid Panel    Component Value Date/Time   CHOL 144 11/08/2017 1334   TRIG 133 11/08/2017 1334   HDL 50 11/08/2017 1334   CHOLHDL 2.9 11/08/2017 1334   VLDL 23 08/03/2016 1115   LDLCALC 73 11/08/2017 1334    Physical Exam:    VS:  BP 112/68 (BP Location: Right Arm, Patient Position: Sitting, Cuff Size: Normal)   Pulse 62   Ht 5\' 8"  (1.727 Padilla)   Wt 173 lb (78.5 kg)   SpO2 97%   BMI 26.30 kg/Padilla     Wt Readings from Last 3 Encounters:  04/09/18 173 lb (78.5 kg)  01/16/18 172 lb 12.8 oz (78.4 kg)  01/02/18  173 lb 0.6 oz (78.5 kg)     GEN: Patient is in no acute distress HEENT: Normal NECK: No JVD; No carotid bruits LYMPHATICS: No lymphadenopathy CARDIAC: Hear sounds regular, 2/6 systolic murmur at the apex. RESPIRATORY:  Clear to auscultation without rales, wheezing or rhonchi  ABDOMEN: Soft, non-tender, non-distended MUSCULOSKELETAL:  No edema; No deformity  SKIN: Warm and dry NEUROLOGIC:  Alert and oriented x 3 PSYCHIATRIC:  Normal affect   Signed, Leonard Brothers, MD  04/09/2018 3:00 PM    [facility]

## 2018-04-09 NOTE — Patient Instructions (Addendum)
Medication Instructions:  Your physician recommends that you continue on your current medications as directed. Please refer to the Current Medication list given to you today.  Labwork: Your physician recommends that you have the following labs drawn: BMP, CBC, TSH, UA, liver and lipid panel. Please return to our office tomorrow fasting (only water for 9 hours before), no appointment necessary.  Testing/Procedures: None  Follow-Up: Your physician recommends that you schedule a follow-up appointment in: 6 months  Any Other Special Instructions Will Be Listed Below (If Applicable).     If you need a refill on your cardiac medications before your next appointment, please call your pharmacy.   [facility]  Garey Ham, RN, BSN

## 2018-04-11 LAB — URINALYSIS
Bilirubin, UA: NEGATIVE
Glucose, UA: NEGATIVE
Ketones, UA: NEGATIVE
Leukocytes, UA: NEGATIVE
Nitrite, UA: NEGATIVE
Protein, UA: NEGATIVE
RBC, UA: NEGATIVE
Specific Gravity, UA: 1.021 (ref 1.005–1.030)
Urobilinogen, Ur: 0.2 mg/dL (ref 0.2–1.0)
pH, UA: 5.5 (ref 5.0–7.5)

## 2018-04-11 LAB — CBC WITH DIFFERENTIAL/PLATELET
Basophils Absolute: 0 10*3/uL (ref 0.0–0.2)
Basos: 1 %
EOS (ABSOLUTE): 0.4 10*3/uL (ref 0.0–0.4)
Eos: 8 %
Hematocrit: 41 % (ref 37.5–51.0)
Hemoglobin: 13.7 g/dL (ref 13.0–17.7)
Immature Grans (Abs): 0 10*3/uL (ref 0.0–0.1)
Immature Granulocytes: 0 %
Lymphocytes Absolute: 1.8 10*3/uL (ref 0.7–3.1)
Lymphs: 31 %
MCH: 30.9 pg (ref 26.6–33.0)
MCHC: 33.4 g/dL (ref 31.5–35.7)
MCV: 93 fL (ref 79–97)
Monocytes Absolute: 0.5 10*3/uL (ref 0.1–0.9)
Monocytes: 9 %
Neutrophils Absolute: 3 10*3/uL (ref 1.4–7.0)
Neutrophils: 51 %
Platelets: 182 10*3/uL (ref 150–450)
RBC: 4.43 x10E6/uL (ref 4.14–5.80)
RDW: 14.1 % (ref 12.3–15.4)
WBC: 5.7 10*3/uL (ref 3.4–10.8)

## 2018-04-11 LAB — HEPATIC FUNCTION PANEL
ALT: 27 IU/L (ref 0–44)
AST: 23 IU/L (ref 0–40)
Albumin: 4.3 g/dL (ref 3.5–4.8)
Alkaline Phosphatase: 88 IU/L (ref 39–117)
Bilirubin Total: 0.4 mg/dL (ref 0.0–1.2)
Bilirubin, Direct: 0.13 mg/dL (ref 0.00–0.40)
Total Protein: 6.9 g/dL (ref 6.0–8.5)

## 2018-04-11 LAB — BASIC METABOLIC PANEL
BUN/Creatinine Ratio: 17 (ref 10–24)
BUN: 22 mg/dL (ref 8–27)
CO2: 27 mmol/L (ref 20–29)
Calcium: 9.4 mg/dL (ref 8.6–10.2)
Chloride: 100 mmol/L (ref 96–106)
Creatinine, Ser: 1.29 mg/dL — ABNORMAL HIGH (ref 0.76–1.27)
GFR calc Af Amer: 63 mL/min/{1.73_m2} (ref >59)
GFR calc non Af Amer: 55 mL/min/{1.73_m2} — ABNORMAL LOW (ref >59)
Glucose: 90 mg/dL (ref 65–99)
Potassium: 4.6 mmol/L (ref 3.5–5.2)
Sodium: 140 mmol/L (ref 134–144)

## 2018-04-11 LAB — LIPID PANEL
Chol/HDL Ratio: 2.7 ratio (ref 0.0–5.0)
Cholesterol, Total: 130 mg/dL (ref 100–199)
HDL: 48 mg/dL (ref >39)
LDL Calculated: 63 mg/dL (ref 0–99)
Triglycerides: 93 mg/dL (ref 0–149)
VLDL Cholesterol Cal: 19 mg/dL (ref 5–40)

## 2018-04-11 LAB — TSH: TSH: 3.74 u[IU]/mL (ref 0.450–4.500)

## 2018-05-08 ENCOUNTER — Ambulatory Visit: Payer: Medicare Other | Admitting: Physician Assistant

## 2018-05-27 ENCOUNTER — Encounter: Payer: Self-pay | Admitting: Physician Assistant

## 2018-05-27 ENCOUNTER — Ambulatory Visit (INDEPENDENT_AMBULATORY_CARE_PROVIDER_SITE_OTHER): Payer: Medicare Other | Admitting: Physician Assistant

## 2018-05-27 VITALS — BP 126/80 | HR 73 | Wt 173.0 lb

## 2018-05-27 DIAGNOSIS — N183 Chronic kidney disease, stage 3 unspecified: Secondary | ICD-10-CM

## 2018-05-27 DIAGNOSIS — F341 Dysthymic disorder: Secondary | ICD-10-CM | POA: Diagnosis not present

## 2018-05-27 DIAGNOSIS — Z5320 Procedure and treatment not carried out because of patient's decision for unspecified reasons: Secondary | ICD-10-CM | POA: Insufficient documentation

## 2018-05-27 DIAGNOSIS — R454 Irritability and anger: Secondary | ICD-10-CM

## 2018-05-27 DIAGNOSIS — I10 Essential (primary) hypertension: Secondary | ICD-10-CM

## 2018-05-27 DIAGNOSIS — F482 Pseudobulbar affect: Secondary | ICD-10-CM | POA: Diagnosis not present

## 2018-05-27 HISTORY — DX: Procedure and treatment not carried out because of patient's decision for unspecified reasons: Z53.20

## 2018-05-27 MED ORDER — SERTRALINE HCL 50 MG PO TABS: 50.0000 mg | ORAL_TABLET | Freq: Every day | ORAL | 0 refills | Status: DC

## 2018-05-27 NOTE — Patient Instructions (Signed)

## 2018-05-27 NOTE — Progress Notes (Signed)
HPI:                                                                Leonard Padilla is a 72 y.o. male who presents to [name]: Primary Care Sports Medicine today for medication mgmt  Reports increased anxiety/irritability for the last month. Has been affecting his sleep, for the last 2-3 days has had difficulty falling asleep which is unusual for him. He is tired. He would like to increase his Sertraline. Currently taking 25 mg daily.  CAD: Recently followed up with Cardiology, Dr. Tomie China taking Metoprolol, baby asa and Lipitor 80 mg daily. Compliant with medications. Denies vision change, headache, chest pain with exertion, orthopnea, lightheadedness, syncope and edema.  Depression screen [facility] 2/9 05/27/2018 11/18/2017 04/24/2017 03/18/2017 08/03/2016  Decreased Interest 2 0 0 3 0  Down, Depressed, Hopeless 1 0 0 0 0  PHQ - 2 Score 3 0 0 3 0  Altered sleeping 0 - 0 0 0  Tired, decreased energy 0 - 1 1 0  Change in appetite 0 - 0 0 1  Feeling bad or failure about yourself  0 - 0 0 0  Trouble concentrating 1 - 0 3 0  Moving slowly or fidgety/restless 0 - 0 0 0  Suicidal thoughts 0 - 0 0 0  PHQ-9 Score 4 - 1 7 1     GAD 7 : Generalized Anxiety Score 05/27/2018 04/29/2017 03/18/2017  Nervous, Anxious, on Edge 2 0 1  Control/stop worrying 0 0 0  Worry too much - different things 0 0 0  Trouble relaxing 0 0 0  Restless 0 0 0  Easily annoyed or irritable 2 1 3   Afraid - awful might happen 0 0 0  Total GAD 7 Score 4 1 4       Past Medical History:  Diagnosis Date  . Aortic stenosis    moderate AS 12/01/15 echo (Dr. Elberta Fortis)  . Arthritis   . CKD (chronic kidney disease) stage 3, GFR 30-59 ml/min (HCC) 08/04/2016  . CKD stage G3a/A1, GFR 45-59 and albumin creatinine ratio <30 mg/g (HCC) 08/04/2016  . Coronary artery disease   . Dysthymia 03/18/2017  . 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 Essential hypertension 10/13/2015  .  Flu 09/2015   influenza A  . Former smoker, stopped smoking in distant past   . GERD (gastroesophageal reflux disease)    occ  . Heart murmur   . History of colon polyps   . Hyperlipidemia   . 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 Pneumonia 09/2015  . Postural dizziness with presyncope 08/27/2016  . Psoriasis   . Sleep apnea    to pick cpap tomorrow 12/13/15  . Stroke (HCC) 09/2015  . Symptomatic carotid artery stenosis 12/15/2015  . 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 Wears glasses    Past Surgical History:  Procedure Laterality Date  . AORTIC VALVE REPLACEMENT N/A 09/09/2017   Procedure: AORTIC VALVE REPLACEMENT (AVR) using a 23 Edwards Perimount Magna Ease Aortic Valve;  Surgeon: Delight Ovens, MD;  Location: [facility] OR;  Service: Open Heart Surgery;  Laterality: N/A;  . COLONOSCOPY W/ BIOPSIES AND POLYPECTOMY    . CORONARY ARTERY BYPASS GRAFT N/A 09/09/2017  Procedure: CORONARY ARTERY BYPASS GRAFTING (CABG) x 4 using the left internal mammary artery and right greater saphenous vein harvested endoscopically.;  Surgeon: Delight Ovens, MD;  Location: [facility] OR;  Service: Open Heart Surgery;  Laterality: N/A;  . ENDARTERECTOMY Right 12/15/2015   Procedure: RIGHT CAROTID ENDARTERECTOMY WITH PATCH ANGIOPLASTY;  Surgeon: Nada Libman, MD;  Location: [facility] OR;  Service: Vascular;  Laterality: Right;  . PATCH ANGIOPLASTY Right 12/15/2015   Procedure: RIGHT CAROTID PATCH ANGIOPLASTY;  Surgeon: Nada Libman, MD;  Location: [facility] OR;  Service: Vascular;  Laterality: Right;  . RIGHT/LEFT HEART CATH AND CORONARY ANGIOGRAPHY N/A 08/23/2017   Procedure: RIGHT/LEFT HEART CATH AND CORONARY ANGIOGRAPHY;  Surgeon: Swaziland, Peter M, MD;  Location: [facility] INVASIVE CV LAB;  Service: Cardiovascular;  Laterality: N/A;  . TEE WITHOUT CARDIOVERSION N/A 09/09/2017   Procedure: TRANSESOPHAGEAL ECHOCARDIOGRAM (TEE);  Surgeon: Delight Ovens, MD;  Location: [facility] OR;  Service: Open Heart Surgery;  Laterality: N/A;   . WISDOM TOOTH EXTRACTION     Social History   Tobacco Use  . Smoking status: Former Smoker    Last attempt to quit: 08/07/1983    Years since quitting: 34.8  . Smokeless tobacco: Never Used  Substance Use Topics  . Alcohol use: Yes    Comment: 1 glass of wine daily    family history includes Cancer in his father and mother.    ROS: negative except as noted in the HPI  Medications: Current Outpatient Medications  Medication Sig Dispense Refill  . aspirin EC 81 MG tablet Take 1 tablet (81 mg total) by mouth daily. 90 tablet 1  . atorvastatin (LIPITOR) 80 MG tablet Take 1 tablet (80 mg total) by mouth at bedtime. 90 tablet 3  . Clobetasol Propionate (TEMOVATE) 0.05 % external spray Apply 1 spray topically 2 (two) times daily.    . fluticasone (FLONASE) 50 MCG/ACT nasal spray Place into both nostrils daily.    . metoprolol tartrate (LOPRESSOR) 25 MG tablet TAKE 1/2 TABLET(12.5 MG) BY MOUTH TWICE DAILY 90 tablet 0  . sertraline (ZOLOFT) 50 MG tablet Take 1 tablet (50 mg total) by mouth daily. 90 tablet 0  . tamsulosin (FLOMAX) 0.4 MG CAPS capsule Take 1 capsule (0.4 mg total) by mouth daily. 90 capsule 0   No current facility-administered medications for this visit.    Allergies  Allergen Reactions  . Viagra [Sildenafil Citrate] Other (See Comments)    COLOR DISCRIMINATION [DOSE RELATED] "blue sensation"       Objective:  BP 126/80   Pulse 73   Wt 173 lb (78.5 kg)   BMI 26.30 kg/m  Gen:  alert, not ill-appearing, no distress, appropriate for age HEENT: head normocephalic without obvious abnormality, conjunctiva and cornea clear, trachea midline Pulm: Normal work of breathing, normal phonation, clear to auscultation bilaterally, no wheezes, rales or rhonchi CV: Normal rate, regular rhythm, s1 and s2 distinct Neuro: alert and oriented x 3, no tremor MSK: extremities atraumatic, normal gait and station Skin: intact, no rashes on exposed skin, no jaundice, no  cyanosis Psych: well-groomed, cooperative, good eye contact, "anxious" mood, affect flat, speech is articulate, and thought processes clear and goal-directed  Lab Results  Component Value Date   CREATININE 1.29 (H) 04/10/2018   BUN 22 04/10/2018   NA 140 04/10/2018   K 4.6 04/10/2018   CL 100 04/10/2018   CO2 27 04/10/2018   Lab Results  Component Value Date   ALT 27 04/10/2018   AST 23 04/10/2018  ALKPHOS 88 04/10/2018   BILITOT 0.4 04/10/2018   Lab Results  Component Value Date   WBC 5.7 04/10/2018   HGB 13.7 04/10/2018   HCT 41.0 04/10/2018   MCV 93 04/10/2018   PLT 182 04/10/2018   Lab Results  Component Value Date   CHOL 130 04/10/2018   HDL 48 04/10/2018   LDLCALC 63 04/10/2018   TRIG 93 04/10/2018   CHOLHDL 2.7 04/10/2018      No results found for this or any previous visit (from the past 72 hour(s)). No results found.    Assessment and Plan: 72 y.o. male with  .Elhadj was seen today for follow-up.  Diagnoses and all orders for this visit:  Dysthymia -     sertraline (ZOLOFT) 50 MG tablet; Take 1 tablet (50 mg total) by mouth daily.  PBA (pseudobulbar affect) -     sertraline (ZOLOFT) 50 MG tablet; Take 1 tablet (50 mg total) by mouth daily.  Irritability and anger -     sertraline (ZOLOFT) 50 MG tablet; Take 1 tablet (50 mg total) by mouth daily.  CKD (chronic kidney disease) stage 3, GFR 30-59 ml/min (HCC)  Hypertension goal BP (blood pressure) < 140/90  Colon cancer screening declined       Patient education and anticipatory guidance given Patient agrees with treatment plan Follow-up in 3 months or sooner as needed if symptoms worsen or fail to improve  Levonne Hubert PA-C

## 2018-06-24 ENCOUNTER — Other Ambulatory Visit: Payer: Self-pay | Admitting: Cardiology

## 2018-08-11 ENCOUNTER — Encounter: Payer: Self-pay | Admitting: Physician Assistant

## 2018-08-11 DIAGNOSIS — H02834 Dermatochalasis of left upper eyelid: Secondary | ICD-10-CM | POA: Diagnosis not present

## 2018-08-11 DIAGNOSIS — H35033 Hypertensive retinopathy, bilateral: Secondary | ICD-10-CM | POA: Diagnosis not present

## 2018-08-11 DIAGNOSIS — H40003 Preglaucoma, unspecified, bilateral: Secondary | ICD-10-CM | POA: Diagnosis not present

## 2018-08-11 DIAGNOSIS — H527 Unspecified disorder of refraction: Secondary | ICD-10-CM | POA: Diagnosis not present

## 2018-08-11 DIAGNOSIS — H43812 Vitreous degeneration, left eye: Secondary | ICD-10-CM | POA: Diagnosis not present

## 2018-08-11 DIAGNOSIS — H25813 Combined forms of age-related cataract, bilateral: Secondary | ICD-10-CM | POA: Diagnosis not present

## 2018-08-11 DIAGNOSIS — H02831 Dermatochalasis of right upper eyelid: Secondary | ICD-10-CM | POA: Diagnosis not present

## 2018-08-12 ENCOUNTER — Encounter: Payer: Self-pay | Admitting: Physician Assistant

## 2018-08-12 DIAGNOSIS — H40003 Preglaucoma, unspecified, bilateral: Secondary | ICD-10-CM

## 2018-08-12 DIAGNOSIS — H35033 Hypertensive retinopathy, bilateral: Secondary | ICD-10-CM

## 2018-08-12 DIAGNOSIS — H25813 Combined forms of age-related cataract, bilateral: Secondary | ICD-10-CM | POA: Insufficient documentation

## 2018-08-12 DIAGNOSIS — H43812 Vitreous degeneration, left eye: Secondary | ICD-10-CM

## 2018-08-12 HISTORY — DX: Combined forms of age-related cataract, bilateral: H25.813

## 2018-08-12 HISTORY — DX: Vitreous degeneration, left eye: H43.812

## 2018-08-12 HISTORY — DX: Preglaucoma, unspecified, bilateral: H40.003

## 2018-08-12 HISTORY — DX: Hypertensive retinopathy, bilateral: H35.033

## 2018-08-26 ENCOUNTER — Other Ambulatory Visit: Payer: Self-pay | Admitting: Physician Assistant

## 2018-08-26 MED ORDER — TAMSULOSIN HCL 0.4 MG PO CAPS: 0.4000 mg | ORAL_CAPSULE | Freq: Every day | ORAL | 0 refills | Status: DC

## 2018-08-27 ENCOUNTER — Other Ambulatory Visit: Payer: Self-pay | Admitting: Physician Assistant

## 2018-08-27 ENCOUNTER — Encounter: Payer: Self-pay | Admitting: Physician Assistant

## 2018-08-27 ENCOUNTER — Ambulatory Visit (INDEPENDENT_AMBULATORY_CARE_PROVIDER_SITE_OTHER): Payer: Medicare HMO | Admitting: Physician Assistant

## 2018-08-27 ENCOUNTER — Telehealth: Payer: Self-pay | Admitting: Physician Assistant

## 2018-08-27 VITALS — BP 134/82 | HR 65 | Wt 181.0 lb

## 2018-08-27 DIAGNOSIS — F482 Pseudobulbar affect: Secondary | ICD-10-CM | POA: Diagnosis not present

## 2018-08-27 DIAGNOSIS — L409 Psoriasis, unspecified: Secondary | ICD-10-CM

## 2018-08-27 DIAGNOSIS — N183 Chronic kidney disease, stage 3 unspecified: Secondary | ICD-10-CM

## 2018-08-27 DIAGNOSIS — R69 Illness, unspecified: Secondary | ICD-10-CM | POA: Diagnosis not present

## 2018-08-27 DIAGNOSIS — F341 Dysthymic disorder: Secondary | ICD-10-CM

## 2018-08-27 DIAGNOSIS — R454 Irritability and anger: Secondary | ICD-10-CM

## 2018-08-27 DIAGNOSIS — I129 Hypertensive chronic kidney disease with stage 1 through stage 4 chronic kidney disease, or unspecified chronic kidney disease: Secondary | ICD-10-CM

## 2018-08-27 MED ORDER — CLOBETASOL PROPIONATE 0.05 % EX OINT: 1.0000 "application " | TOPICAL_OINTMENT | Freq: Two times a day (BID) | CUTANEOUS | 1 refills | Status: DC | PRN

## 2018-08-27 MED ORDER — IRBESARTAN 75 MG PO TABS: 75.0000 mg | ORAL_TABLET | Freq: Every day | ORAL | 0 refills | Status: DC

## 2018-08-27 MED ORDER — SERTRALINE HCL 50 MG PO TABS: 50.0000 mg | ORAL_TABLET | Freq: Every day | ORAL | 2 refills | Status: DC

## 2018-08-27 NOTE — Progress Notes (Signed)
HPI:                                                                Leonard Padilla is a 73 y.o. male who presents to [name]: Primary Care Sports Medicine today for medication management  Sertraline was increased to 50 mg daily 3 months ago due to increased anxiety/irritability and sleep difficulty.  Reports this has helped him significantly. Reports he is sleeping well and sleep is restorative.  Requesting refill of Clobetasol for psoriasis of his ankles. Does not have a rash currently.  Depression screen [facility] 2/9 08/27/2018 05/27/2018 11/18/2017 04/24/2017 03/18/2017  Decreased Interest 0 2 0 0 3  Down, Depressed, Hopeless 0 1 0 0 0  PHQ - 2 Score 0 3 0 0 3  Altered sleeping 0 0 - 0 0  Tired, decreased energy 0 0 - 1 1  Change in appetite 0 0 - 0 0  Feeling bad or failure about yourself  0 0 - 0 0  Trouble concentrating 1 1 - 0 3  Moving slowly or fidgety/restless 0 0 - 0 0  Suicidal thoughts 0 0 - 0 0  PHQ-9 Score 1 4 - 1 7    GAD 7 : Generalized Anxiety Score 08/27/2018 05/27/2018 04/29/2017 03/18/2017  Nervous, Anxious, on Edge 0 2 0 1  Control/stop worrying 0 0 0 0  Worry too much - different things 0 0 0 0  Trouble relaxing 0 0 0 0  Restless 0 0 0 0  Easily annoyed or irritable 0 2 1 3   Afraid - awful might happen 0 0 0 0  Total GAD 7 Score 0 4 1 4       Past Medical History:  Diagnosis Date  . Aortic stenosis    moderate AS 12/01/15 echo (Dr. Elberta Fortis)  . Arthritis   . CKD (chronic kidney disease) stage 3, GFR 30-59 ml/min (HCC) 08/04/2016  . CKD stage G3a/A1, GFR 45-59 and albumin creatinine ratio <30 mg/g (HCC) 08/04/2016  . Combined form of age-related cataract, both eyes 08/12/2018  . Coronary artery disease   . Dysthymia 03/18/2017  . 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 Essential hypertension 10/13/2015  . Flu 09/2015   influenza A  . Former smoker, stopped smoking in distant past   . GERD  (gastroesophageal reflux disease)    occ  . Glaucoma suspect of both eyes 08/12/2018  . Heart murmur   . History of colon polyps   . Hyperlipidemia   . Hypertensive retinopathy of both eyes 08/12/2018  . 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 Pneumonia 09/2015  . Posterior vitreous detachment, left eye 08/12/2018  . Postural dizziness with presyncope 08/27/2016  . Psoriasis   . Sleep apnea    to pick cpap tomorrow 12/13/15  . Stroke (HCC) 09/2015  . Symptomatic carotid artery stenosis 12/15/2015  . 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 Wears glasses    Past Surgical History:  Procedure Laterality Date  . AORTIC VALVE REPLACEMENT N/A 09/09/2017   Procedure: AORTIC VALVE REPLACEMENT (AVR) using a 23 Edwards Perimount Magna Ease Aortic Valve;  Surgeon: Delight Ovens, MD;  Location: [facility] OR;  Service: Open Heart Surgery;  Laterality: N/A;  .  COLONOSCOPY W/ BIOPSIES AND POLYPECTOMY    . CORONARY ARTERY BYPASS GRAFT N/A 09/09/2017   Procedure: CORONARY ARTERY BYPASS GRAFTING (CABG) x 4 using the left internal mammary artery and right greater saphenous vein harvested endoscopically.;  Surgeon: Delight OvensGerhardt, Edward B, MD;  [facility];  Service: Open Heart Surgery;  Laterality: N/A;  . ENDARTERECTOMY Right 12/15/2015   Procedure: RIGHT CAROTID ENDARTERECTOMY WITH PATCH ANGIOPLASTY;  Surgeon: Nada LibmanVance W Brabham, MD;  Location: [facility] OR;  Service: Vascular;  Laterality: Right;  . PATCH ANGIOPLASTY Right 12/15/2015   Procedure: RIGHT CAROTID PATCH ANGIOPLASTY;  Surgeon: Nada LibmanVance W Brabham, MD;  [facility];  Service: Vascular;  Laterality: Right;  . RIGHT/LEFT HEART CATH AND CORONARY ANGIOGRAPHY N/A 08/23/2017   Procedure: RIGHT/LEFT HEART CATH AND CORONARY ANGIOGRAPHY;  Surgeon: SwazilandJordan, Peter M, MD;  [facility];  Service: Cardiovascular;  Laterality: N/A;  . TEE WITHOUT CARDIOVERSION N/A 09/09/2017   Procedure: TRANSESOPHAGEAL ECHOCARDIOGRAM (TEE);  Surgeon: Delight OvensGerhardt, Edward B, MD;   [facility];  Service: Open Heart Surgery;  Laterality: N/A;  . WISDOM TOOTH EXTRACTION     Social History   Tobacco Use  . Smoking status: Former Smoker    Last attempt to quit: 08/07/1983    Years since quitting: 35.0  . Smokeless tobacco: Never Used  Substance Use Topics  . Alcohol use: Yes    Comment: 1 glass of wine daily    family history includes Cancer in his father and mother.    ROS: negative except as noted in the HPI  Medications: Current Outpatient Medications  Medication Sig Dispense Refill  . aspirin EC 81 MG tablet Take 1 tablet (81 mg total) by mouth daily. 90 tablet 1  . atorvastatin (LIPITOR) 80 MG tablet Take 1 tablet (80 mg total) by mouth at bedtime. 90 tablet 3  . Clobetasol Propionate (TEMOVATE) 0.05 % external spray Apply 1 spray topically 2 (two) times daily.    . fluticasone (FLONASE) 50 MCG/ACT nasal spray Place into both nostrils daily.    . metoprolol tartrate (LOPRESSOR) 25 MG tablet TAKE 1/2 TABLET BY MOUTH TWICE DAILY 90 tablet 2  . sertraline (ZOLOFT) 50 MG tablet Take 1 tablet (50 mg total) by mouth daily. 90 tablet 0  . tamsulosin (FLOMAX) 0.4 MG CAPS capsule Take 1 capsule (0.4 mg total) by mouth daily. 90 capsule 0   No current facility-administered medications for this visit.    Allergies  Allergen Reactions  . Viagra [Sildenafil Citrate] Other (See Comments)    COLOR DISCRIMINATION [DOSE RELATED] "blue sensation"       Objective:  BP 134/82   Pulse 65   Wt 181 lb (82.1 kg)   BMI 27.52 kg/m  Gen:  alert, not ill-appearing, no distress, appropriate for age HEENT: head normocephalic without obvious abnormality, conjunctiva and cornea clear, trachea midline Pulm: Normal work of breathing, normal phonation Neuro: alert and oriented x 3, no tremor MSK: extremities atraumatic, normal gait and station Skin: intact, no rashes on exposed skin, no jaundice, no cyanosis Psych: well-groomed, cooperative, good eye contact, euthymic  mood, affect mood-congruent, speech is articulate, and thought processes clear and goal-directed  Lab Results  Component Value Date   CREATININE 1.29 (H) 04/10/2018   BUN 22 04/10/2018   NA 140 04/10/2018   K 4.6 04/10/2018   CL 100 04/10/2018   CO2 27 04/10/2018   Lab Results  Component Value Date   ALT 27 04/10/2018   AST 23 04/10/2018  ALKPHOS 88 04/10/2018   BILITOT 0.4 04/10/2018   Lab Results  Component Value Date   CHOL 130 04/10/2018   HDL 48 04/10/2018   LDLCALC 63 04/10/2018   TRIG 93 04/10/2018   CHOLHDL 2.7 04/10/2018   The 10-year ASCVD risk score Denman George(Goff DC Jr., et al., 2013) is: 25.2%   Values used to calculate the score:     Age: [age] years     Sex: Male     Is Non-Hispanic African American: No     Diabetic: No     Tobacco smoker: No     Systolic Blood Pressure: 134 mmHg     Is BP treated: Yes     HDL Cholesterol: 48 mg/dL     Total Cholesterol: 130 mg/dL   No results found for this or any previous visit (from the past 72 hour(s)). No results found.    Assessment and Plan: 73 y.o. male with   .Diagnoses and all orders for this visit:  Dysthymia -     sertraline (ZOLOFT) 50 MG tablet; Take 1 tablet (50 mg total) by mouth daily.  PBA (pseudobulbar affect) -     sertraline (ZOLOFT) 50 MG tablet; Take 1 tablet (50 mg total) by mouth daily.  Psoriasis -     clobetasol ointment (TEMOVATE) 0.05 %; Apply 1 application topically 2 (two) times daily as needed. Do not use for more than 2 consecutive weeks. Cycle use  Irritability and anger -     sertraline (ZOLOFT) 50 MG tablet; Take 1 tablet (50 mg total) by mouth daily.  CKD (chronic kidney disease) stage 3, GFR 30-59 ml/min (HCC)   PHQ9=1, GAD7=0 Cont Sertraline 50 mg QHS  CKD stable, GFR 55 Avoid nephrotoxins ARB was discontinued in 2019 (unknown reason). Messaged Cardiology to discuss resuming ARB for renal protection  Patient reminded to schedule Cardiology f/u in April 2020   Patient  education and anticipatory guidance given Patient agrees with treatment plan Follow-up in 6 months or sooner as needed if symptoms worsen or fail to improve  Levonne Hubertharley E. Cummings PA-C

## 2018-08-27 NOTE — Telephone Encounter (Signed)
Pt notified of new medication and to check kidney function at the lab in 3 weeks. ("3 months" in the note was an error.)

## 2018-08-27 NOTE — Telephone Encounter (Signed)
-----   Message from Garwin Brothers, MD sent at 08/27/2018 11:39 AM EST ----- Regarding: RE: CKD Yes I think that is a good idea.  You can start on the low-dose monitor the patient's blood pressure and renal function.  Thank you ----- Message ----- From: Carlis Stable, PA-C Sent: 08/27/2018  11:13 AM EST To: Garwin Brothers, MD Subject: CKD                                            Good morning Dr. Tomie China, Would it be okay to resume Gradyn's ARB for his CKD? BP today was 134/82, so would do a low-dose. I am not sure why it was discontinued, but I think it may have occurred during his hospitalization in 2019. Let me know your thoughts! Gena Fray

## 2018-08-27 NOTE — Patient Instructions (Addendum)
Schedule appt with Cardiologist for April 2020  Chronic Kidney Disease, Adult Chronic kidney disease (CKD) occurs when the kidneys become damaged slowly over a long period of time. The kidneys are a pair of organs that do many important jobs in the body, including:  Removing waste and extra fluid from the blood to make urine.  Making hormones that maintain the amount of fluid in tissues and blood vessels.  Maintaining the right amount of fluids and chemicals in the body. A small amount of kidney damage may not cause problems, but a large amount of damage may make it hard or impossible for the kidneys to work the way they should. If steps are not taken to slow down kidney damage or to stop it from getting worse, the kidneys may stop working permanently (end-stage renal disease or ESRD). Most of the time, CKD does not go away, but it can often be controlled. People who have CKD are usually able to live normal lives. What are the causes? The most common causes of this condition are diabetes and high blood pressure (hypertension). Other causes include:  Heart and blood vessel (cardiovascular) disease.  Kidney diseases, such as: ? Glomerulonephritis. ? Interstitial nephritis. ? Polycystic kidney disease. ? Renal vascular disease.  Diseases that affect the immune system.  Genetic diseases.  Medicines that damage the kidneys, such as anti-inflammatory medicines.  Being around or being in contact with poisonous (toxic) substances.  A kidney or urinary infection that occurs again and again (recurs).  Vasculitis. This is swelling or inflammation of the blood vessels.  A problem with urine flow that may be caused by: ? Cancer. ? Having kidney stones more than one time. ? An enlarged prostate, in males. What increases the risk? You are more likely to develop this condition if you:  Are older than age 75.  Are male.  Are African-American, Hispanic, Asian, [place]er, or  American [place].  Are a current or former smoker.  Are obese.  Have a family history of kidney disease or failure.  Often take medicines that are damaging to the kidneys. What are the signs or symptoms? Symptoms of this condition include:  Swelling (edema) of the face, legs, ankles, or feet.  Tiredness (lethargy) and having less energy.  Nausea or vomiting.  Confusion or trouble concentrating.  Problems with urination, such as: ? Painful or burning feeling during urination. ? Decreased urine production. ? Frequent urination, especially at night. ? Bloody urine.  Muscle twitches and cramps, especially in the legs.  Shortness of breath.  Weakness.  Loss of appetite.  Metallic taste in the mouth.  Trouble sleeping.  Dry, itchy skin.  A low blood count (anemia).  Pale lining of the eyelids and surface of the eye (conjunctiva). Symptoms develop slowly and may not be obvious until the kidney damage becomes severe. It is possible to have kidney disease for years without having any symptoms. How is this diagnosed? This condition may be diagnosed based on:  Blood tests.  Urine tests.  Imaging tests, such as an ultrasound or CT scan.  A test in which a sample of tissue is removed from the kidneys to be examined under a microscope (kidney biopsy). These test results will help your health care provider determine how serious the CKD is. How is this treated? There is no cure for most cases of this condition, but treatment usually relieves symptoms and prevents or slows the progression of the disease. Treatment may include:  Making diet changes, which may  require you to avoid alcohol, salty foods (sodium), and foods that are high in potassium, calcium, and protein.  Medicines: ? To lower blood pressure. ? To control blood glucose. ? To relieve anemia. ? To relieve swelling. ? To protect your bones. ? To improve the balance of electrolytes in your blood.  Removing  toxic waste from the body through types of dialysis, if the kidneys can no longer do their job (kidney failure).  Managing any other conditions that are causing your CKD or making it worse. Follow these instructions at home: Medicines  Take over-the-counter and prescription medicines only as told by your health care provider. The dose of some medicines that you take may need to be adjusted.  Do not take any new medicines unless approved by your health care provider. Many medicines can worsen your kidney damage.  Do not take any vitamin and mineral supplements unless approved by your health care provider. Many nutritional supplements can worsen your kidney damage. General instructions  Follow your prescribed diet as told by your health care provider.  Do not use any products that contain nicotine or tobacco, such as cigarettes and e-cigarettes. If you need help quitting, ask your health care provider.  Monitor and track your blood pressure at home. Report changes in your blood pressure as told by your health care provider.  If you are being treated for diabetes, monitor and track your blood sugar (blood glucose) levels as told by your health care provider.  Maintain a healthy weight. If you need help with this, ask your health care provider.  Start or continue an exercise plan. Exercise at least 30 minutes a day, 5 days a week.  Keep your immunizations up to date as told by your health care provider.  Keep all follow-up visits as told by your health care provider. This is important. Where to find more information  American Association of Kidney Patients: ResidentialShow.is  [employer]: www.kidney.org  [employer]: FightingMatch.com.ee  [facility]: www.lifeoptions.org and www.kidneyschool.org Contact a health care provider if:  Your symptoms get worse.  You develop new symptoms. Get help right away if:  You develop symptoms of ESRD,  which include: ? Headaches. ? Numbness in the hands or feet. ? Easy bruising. ? Frequent hiccups. ? Chest pain. ? Shortness of breath. ? Lack of menstruation, in women.  You have a fever.  You have decreased urine production.  You have pain or bleeding when you urinate. Summary  Chronic kidney disease (CKD) occurs when the kidneys become damaged slowly over a long period of time.  The most common causes of this condition are diabetes and high blood pressure (hypertension).  There is no cure for most cases of this condition, but treatment usually relieves symptoms and prevents or slows the progression of the disease. Treatment may include a combination of medicines and lifestyle changes. This information is not intended to replace advice given to you by your health care provider. Make sure you discuss any questions you have with your health care provider. Document Released: 05/01/2008 Document Revised: 08/30/2016 Document Reviewed: 08/30/2016 Elsevier Interactive Patient Education  2019 [employer].   Food Basics for Chronic Kidney Disease When your kidneys are not working well, they cannot remove waste and excess substances from your blood as effectively as they did before. This can lead to a buildup and imbalance of these substances, which can worsen kidney damage and affect how your body functions. Certain foods lead to a buildup of  these substances in the body. By changing your diet as recommended by your diet and nutrition specialist (dietitian) or health care provider, you could help prevent further kidney damage and delay or prevent the need for dialysis. What are tips for following this plan? General instructions   Work with your health care provider and dietitian to develop a meal plan that is right for you. Foods you can eat, limit, or avoid will be different for each person depending on the stage of kidney disease and any other existing health conditions.  Talk with your  health care provider about whether you should take a vitamin and mineral supplement.  Use standard measuring cups and spoons to measure servings of foods. Use a kitchen scale to measure portions of protein foods.  If directed by your health care provider, avoid drinking too much fluid. Measure and count all liquids, including water, ice, soups, flavored gelatin, and frozen desserts such as popsicles or ice cream. Reading food labels  Check the amount of sodium in foods. Choose foods that have less than 300 milligrams (mg) per serving.  Check the ingredient list for phosphorus or potassium-based additives or preservatives.  Check the amount of saturated and trans fat. Limit or avoid these fats as told by your dietitian. Shopping  Avoid buying foods that are: ? Processed, frozen, or prepackaged. ? Calcium-enriched or fortified.  Do not buy foods that have salt or sodium listed among the first five ingredients.  Do not buy canned vegetables. Cooking  Replace animal proteins, such as meat, fish, eggs, or dairy, with plant proteins from beans, nuts, and soy. ? Use soy milk instead of cow's milk. ? Add beans or tofu to soups, casseroles, or pasta dishes instead of meat.  Soak vegetables, such as potatoes, before cooking to reduce potassium. To do this: ? Peel and cut into small pieces. ? Soak in warm water for at least 2 hours. For every 1 cup of vegetables, use 10 cups of water. ? Drain and rinse with warm water. ? Boil for at least 5 minutes. Meal planning  Limit the amount of protein from plant and animal sources you eat each day.  Do not add salt to food when cooking or before eating.  Eat meals and snacks at around the same time each day. If you have diabetes:  If you have diabetes (diabetes mellitus) and chronic kidney disease, it is important to keep your blood glucose in the target range recommended by your health care provider. Follow your diabetes management plan. This  may include: ? Checking your blood glucose regularly. ? Taking oral medicines, insulin, or both. ? Exercising for at least 30 minutes on 5 or more days each week, or as told by your health care provider. ? Tracking how many servings of carbohydrates you eat at each meal.  You may be given specific guidelines on how much of certain foods and nutrients you may eat, depending on your stage of kidney disease and whether you have high blood pressure (hypertension). Follow your meal plan as told by your dietitian. What nutrients should be limited? The items listed are not a complete list. Talk with your dietitian about what dietary choices are best for you. Potassium Potassium affects how steadily your heart beats. If too much potassium builds up in your blood, it can cause an irregular heartbeat or even a heart attack. You may need to eat less potassium, depending on your blood potassium levels and the stage of kidney disease. Talk to  your dietitian about how much potassium you may have each day. You may need to limit or avoid foods that are high in potassium, such as:  Milk and soy milk.  Fruits, such as bananas, papaya, apricots, nectarines, melon, prunes, raisins, kiwi, and oranges.  Vegetables, such as potatoes, sweet potatoes, yams, tomatoes, leafy greens, beets, okra, avocado, pumpkin, and winter squash.  White and lima beans. Phosphorus Phosphorus is a mineral found in your bones. A balance between calcium and phosphorous is needed to build and maintain healthy bones. Too much phosphorus pulls calcium from your bones. This can make your bones weak and more likely to break. Too much phosphorus can also make your skin itch. You may need to eat less phosphorus depending on your blood phosphorus levels and the stage of kidney disease. Talk to your dietitian about how much potassium you may have each day. You may need to take medicine to lower your blood phosphorus levels if diet changes do not  help. You may need to limit or avoid foods that are high in phosphorus, such as:  Milk and dairy products.  Dried beans and peas.  Tofu, soy milk, and other soy-based meat replacements.  Colas.  Nuts and peanut butter.  Meat, poultry, and fish.  Bran cereals and oatmeals. Protein Protein helps you to make and keep muscle. It also helps in the repair of your body's cells and tissues. One of the natural breakdown products of protein is a waste product called urea. When your kidneys are not working properly, they cannot remove wastes, such as urea, like they did before you developed chronic kidney disease. Reducing how much protein you eat can help prevent a buildup of urea in your blood. Depending on your stage of kidney disease, you may need to limit foods that are high in protein. Sources of animal protein include:  Meat (all types).  Fish and seafood.  Poultry.  Eggs.  Dairy. Other protein foods include:  Beans and legumes.  Nuts and nut butter.  Soy and tofu. Sodium Sodium, which is found in salt, helps maintain a healthy balance of fluids in your body. Too much sodium can increase your blood pressure and have a negative effect on the function of your heart and lungs. Too much sodium can also cause your body to retain too much fluid, making your kidneys work harder. Most people should have less than 2,300 milligrams (mg) of sodium each day. If you have hypertension, you may need to limit your sodium to 1,500 mg each day. Talk to your dietitian about how much sodium you may have each day. You may need to limit or avoid foods that are high in sodium, such as:  Salt seasonings.  Soy sauce.  Cured and processed meats.  Salted crackers and snack foods.  Fast food.  Canned soups and most canned foods.  Pickled foods.  Vegetable juice.  Boxed mixes or ready-to-eat boxed meals and side dishes.  Bottled dressings, sauces, and marinades. Summary  Chronic kidney  disease can lead to a buildup and imbalance of waste and excess substances in the body. Certain foods lead to a buildup of these substances. By adjusting your intake of these foods, you could help prevent more kidney damage and delay or prevent the need for dialysis.  Food adjustments are different for each person with chronic kidney disease. Work with a dietitian to set up nutrient goals and a meal plan that is right for you.  If you have diabetes and chronic  kidney disease, it is important to keep your blood glucose in the target range recommended by your health care provider. This information is not intended to replace advice given to you by your health care provider. Make sure you discuss any questions you have with your health care provider. Document Released: 10/13/2002 Document Revised: 07/18/2016 Document Reviewed: 07/18/2016 Elsevier Interactive Patient Education  2019 [employer]

## 2018-10-06 ENCOUNTER — Encounter: Payer: Self-pay | Admitting: Physician Assistant

## 2018-10-06 ENCOUNTER — Telehealth: Payer: Self-pay | Admitting: *Deleted

## 2018-10-06 DIAGNOSIS — R351 Nocturia: Secondary | ICD-10-CM | POA: Diagnosis not present

## 2018-10-06 DIAGNOSIS — Z2989 Encounter for other specified prophylactic measures: Secondary | ICD-10-CM

## 2018-10-06 DIAGNOSIS — Z298 Encounter for other specified prophylactic measures: Secondary | ICD-10-CM

## 2018-10-06 DIAGNOSIS — N401 Enlarged prostate with lower urinary tract symptoms: Secondary | ICD-10-CM | POA: Diagnosis not present

## 2018-10-06 HISTORY — DX: Encounter for other specified prophylactic measures: Z29.8

## 2018-10-06 HISTORY — DX: Encounter for other specified prophylactic measures: Z29.89

## 2018-10-06 MED ORDER — AMOXICILLIN 500 MG PO TABS: ORAL_TABLET | ORAL | 0 refills | Status: DC

## 2018-10-06 NOTE — Telephone Encounter (Signed)
*  STAT* If patient is at the pharmacy, call can be transferred to refill team.   1. Which medications need to be refilled? (please list name of each medication and dose if known) Amoxicillin 500 mg, Take 4 capsules by mouth 30 to 60 minutes before dental appointment  2. Which pharmacy/location (including street and city if local pharmacy) is medication to be sent to?[facility] in [place]  3. Do they need a 30 day or 90 day supply?

## 2018-10-07 NOTE — Telephone Encounter (Signed)
Patient's wife reports this has already been refilled. Will complete task.

## 2018-10-09 ENCOUNTER — Ambulatory Visit: Payer: Medicare HMO | Admitting: Cardiology

## 2018-10-09 ENCOUNTER — Encounter: Payer: Self-pay | Admitting: Cardiology

## 2018-10-09 VITALS — BP 126/82 | HR 65 | Ht 68.0 in | Wt 183.0 lb

## 2018-10-09 DIAGNOSIS — Z952 Presence of prosthetic heart valve: Secondary | ICD-10-CM

## 2018-10-09 DIAGNOSIS — E785 Hyperlipidemia, unspecified: Secondary | ICD-10-CM | POA: Diagnosis not present

## 2018-10-09 DIAGNOSIS — R55 Syncope and collapse: Secondary | ICD-10-CM

## 2018-10-09 DIAGNOSIS — I7090 Unspecified atherosclerosis: Secondary | ICD-10-CM

## 2018-10-09 DIAGNOSIS — Z951 Presence of aortocoronary bypass graft: Secondary | ICD-10-CM | POA: Diagnosis not present

## 2018-10-09 DIAGNOSIS — R42 Dizziness and giddiness: Secondary | ICD-10-CM | POA: Diagnosis not present

## 2018-10-09 NOTE — Patient Instructions (Signed)
Medication Instructions:  Your physician has recommended you make the following change in your medication:  Stop: metoprolol   If you need a refill on your cardiac medications before your next appointment, please call your pharmacy.   Lab work: Your physician recommends that you return for lab work within a week fasting: bmp, tsh, lft and lipids.   If you have labs (blood work) drawn today and your tests are completely normal, you will receive your results only by: Marland Kitchen MyChart Message (if you have MyChart) OR . A paper copy in the mail If you have any lab test that is abnormal or we need to change your treatment, we will call you to review the results.  Testing/Procedures: None.   Follow-Up: At [facility], you and your health needs are our priority.  As part of our continuing mission to provide you with exceptional heart care, we have created designated Provider Care Teams.  These Care Teams include your primary Cardiologist (physician) and Advanced Practice Providers (APPs -  Physician Assistants and Nurse Practitioners) who all work together to provide you with the care you need, when you need it. You will need a follow up appointment in 6 months.  Please call our office 2 months in advance to schedule this appointment.  You may see No primary care provider on file. or another member of our [employer] in [place]: Gypsy Balsam, MD . Norman Herrlich, MD  Any Other Special Instructions Will Be Listed Below (If Applicable).

## 2018-10-09 NOTE — Progress Notes (Signed)
Cardiology Office Note:    Date:  10/09/2018   ID:  Leonard, Padilla 05-Oct-1945, MRN [id number]  PCP:  Carlis Stable, PA-C  Cardiologist:  Garwin Brothers, MD   Referring MD: Donzetta Kohut*    ASSESSMENT:    1. Postural dizziness with presyncope   2. Atherosclerosis   3. Dyslipidemia, goal LDL below 70   4. Status post four vessel coronary artery bypass   5. S/P AVR    PLAN:    In order of problems listed above:  1. Secondary prevention stressed with the patient.  Importance of compliance with diet and medication stressed and he vocalized understanding.  His blood pressure is stable.  Diet was discussed for dyslipidemia. 2. I think his blood pressure is borderline and he has developed symptoms of postural hypotension after his ARB has been initiated.  In view of this I will ask him to stop his metoprolol.  I think his heart rate is on the lower side.  Also he is going to start an exercise program.  I also feel that his ARB is beneficial in view of his renal insufficiency. 3. He will be back in the next few days for fasting bloods including lipids 4. Patient will be seen in follow-up appointment in 6 months or earlier if the patient has any concerns    Medication Adjustments/Labs and Tests Ordered: Current medicines are reviewed at length with the patient today.  Concerns regarding medicines are outlined above.  Orders Placed This Encounter  Procedures  . Basic metabolic panel  . TSH  . Hepatic function panel  . Lipid Profile  . EKG 12-Lead   No orders of the defined types were placed in this encounter.    No chief complaint on file.    History of Present Illness:    Leonard Padilla is a 73 y.o. male.  Patient has aortic valve replacement.  He denies any problems at this time.  No chest pain orthopnea or PND.  He leads a sedentary lifestyle.  He mentions to me that whenever he changes posture he feels a little dizzy and lightheaded.  At the time  of my evaluation, the patient is alert awake oriented and in no distress.  He was started on a ARB recently.  Past Medical History:  Diagnosis Date  . Aortic stenosis    moderate AS 12/01/15 echo (Dr. Elberta Fortis)  . Arthritis   . CKD (chronic kidney disease) stage 3, GFR 30-59 ml/min (HCC) 08/04/2016  . CKD stage G3a/A1, GFR 45-59 and albumin creatinine ratio <30 mg/g (HCC) 08/04/2016  . Combined form of age-related cataract, both eyes 08/12/2018  . Coronary artery disease   . Dysthymia 03/18/2017  . 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 Essential hypertension 10/13/2015  . Flu 09/2015   influenza A  . Former smoker, stopped smoking in distant past   . GERD (gastroesophageal reflux disease)    occ  . Glaucoma suspect of both eyes 08/12/2018  . Heart murmur   . History of colon polyps   . Hyperlipidemia   . Hypertensive retinopathy of both eyes 08/12/2018  . 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 Pneumonia 09/2015  . Posterior vitreous detachment, left eye 08/12/2018  . Postural dizziness with presyncope 08/27/2016  . Psoriasis   . Sleep apnea    to pick cpap tomorrow 12/13/15  . Stroke (HCC) 09/2015  . Symptomatic carotid artery stenosis 12/15/2015  .  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 Wears glasses     Past Surgical History:  Procedure Laterality Date  . AORTIC VALVE REPLACEMENT N/A 09/09/2017   Procedure: AORTIC VALVE REPLACEMENT (AVR) using a 23 Edwards Perimount Magna Ease Aortic Valve;  Surgeon: Delight Ovens, MD;  Location: [facility] OR;  Service: Open Heart Surgery;  Laterality: N/A;  . COLONOSCOPY W/ BIOPSIES AND POLYPECTOMY    . CORONARY ARTERY BYPASS GRAFT N/A 09/09/2017   Procedure: CORONARY ARTERY BYPASS GRAFTING (CABG) x 4 using the left internal mammary artery and right greater saphenous vein harvested endoscopically.;  Surgeon: Delight Ovens, MD;  Location: [facility] OR;  Service: Open Heart Surgery;  Laterality: N/A;  . ENDARTERECTOMY  Right 12/15/2015   Procedure: RIGHT CAROTID ENDARTERECTOMY WITH PATCH ANGIOPLASTY;  Surgeon: Nada Libman, MD;  Location: [facility] OR;  Service: Vascular;  Laterality: Right;  . PATCH ANGIOPLASTY Right 12/15/2015   Procedure: RIGHT CAROTID PATCH ANGIOPLASTY;  Surgeon: Nada Libman, MD;  Location: [facility] OR;  Service: Vascular;  Laterality: Right;  . RIGHT/LEFT HEART CATH AND CORONARY ANGIOGRAPHY N/A 08/23/2017   Procedure: RIGHT/LEFT HEART CATH AND CORONARY ANGIOGRAPHY;  Surgeon: [place], Peter M, MD;  [facility] INVAS[facility] CV LAB;  Service: Cardiovascular;  Laterality: N/A;  . TEE WITHOUT CARDIOVERSION N/A 09/09/2017   Procedure: TRANSESOPHAGEAL ECHOCARDIOGRAM (TEE);  Surgeon: Delight Ovens, MD;  Location: [facility] OR;  Service: Open Heart Surgery;  Laterality: N/A;  . WISDOM TOOTH EXTRACTION      Current Medications: Current Meds  Medication Sig  . amoxicillin (AMOXIL) 500 MG tablet 4 tabs ( ) 1 hours prior to procedure  . aspirin EC 81 MG tablet Take 1 tablet (81 mg total) by mouth daily.  Marland Kitchen atorvastatin (LIPITOR) 80 MG tablet Take 1 tablet (80 mg total) by mouth at bedtime.  . clobetasol ointment (TEMOVATE) 0.05 % Apply 1 application topically 2 (two) times daily as needed. Do not use for more than 2 consecutive weeks. Cycle use  . fluticasone (FLONASE) 50 MCG/ACT nasal spray Place into both nostrils daily.  . irbesartan (AVAPRO) 75 MG tablet Take 1 tablet (75 mg total) by mouth daily.  . sertraline (ZOLOFT) 50 MG tablet Take 1 tablet (50 mg total) by mouth daily.  . tamsulosin (FLOMAX) 0.4 MG CAPS capsule Take 1 capsule (0.4 mg total) by mouth daily.  . [DISCONTINUED] metoprolol tartrate (LOPRESSOR) 25 MG tablet TAKE 1/2 TABLET BY MOUTH TWICE DAILY     Allergies:   Viagra [sildenafil citrate]   Social History   Socioeconomic History  . Marital status: Married    Spouse name: [name]  . Number of children: 2  . Years of education: 61  . Highest education level: Not on file    Occupational History  . Occupation: Retired    Comment: retired Clinical biochemist, [employer]  Social Needs  . Financial resource strain: Not on file  . Food insecurity:    Worry: Not on file    Inability: Not on file  . Transportation needs:    Medical: Not on file    Non-medical: Not on file  Tobacco Use  . Smoking status: Former Smoker    Last attempt to quit: 08/07/1983    Years since quitting: 35.1  . Smokeless tobacco: Never Used  Substance and Sexual Activity  . Alcohol use: Yes    Comment: 1 glass of wine daily   . Drug use: No  . Sexual activity: Not Currently  Lifestyle  . Physical activity:  Days per week: Not on file    Minutes per session: Not on file  . Stress: Not on file  Relationships  . Social connections:    Talks on phone: Not on file    Gets together: Not on file    Attends religious service: Not on file    Active member of club or organization: Not on file    Attends meetings of clubs or organizations: Not on file    Relationship status: Not on file  Other Topics Concern  . Not on file  Social History Narrative   Lives with wife   Caffeine use- coffee 3-4 cups daily     Family History: The patient's family history includes Cancer in his father and mother.  ROS:   Please see the history of present illness.    All other systems reviewed and are negative.  EKGs/Labs/Other Studies Reviewed:    The following studies were reviewed today: I discussed my findings with the patient at extensive length.   Recent Labs: 04/10/2018: ALT 27; BUN 22; Creatinine, Ser 1.29; Hemoglobin 13.7; Platelets 182; Potassium 4.6; Sodium 140; TSH 3.740  Recent Lipid Panel    Component Value Date/Time   CHOL 130 04/10/2018 0849   TRIG 93 04/10/2018 0849   HDL 48 04/10/2018 0849   CHOLHDL 2.7 04/10/2018 0849   CHOLHDL 2.9 11/08/2017 1334   VLDL 23 08/03/2016 1115   LDLCALC 63 04/10/2018 0849   LDLCALC 73 11/08/2017 1334    Physical Exam:    VS:  BP 126/82 (BP  Location: Right Arm, Patient Position: Sitting, Cuff Size: Normal)   Pulse 65   Ht 5\' 8"  (1.727 m)   Wt 183 lb (83 kg)   SpO2 98%   BMI 27.83 kg/m     Wt Readings from Last 3 Encounters:  10/09/18 183 lb (83 kg)  08/27/18 181 lb (82.1 kg)  05/27/18 173 lb (78.5 kg)     GEN: Patient is in no acute distress HEENT: Normal NECK: No JVD; No carotid bruits LYMPHATICS: No lymphadenopathy CARDIAC: Hear sounds regular, 2/6 systolic murmur at the apex. RESPIRATORY:  Clear to auscultation without rales, wheezing or rhonchi  ABDOMEN: Soft, non-tender, non-distended MUSCULOSKELETAL:  No edema; No deformity  SKIN: Warm and dry NEUROLOGIC:  Alert and oriented x 3 PSYCHIATRIC:  Normal affect   Signed, Garwin Brothers, MD  10/09/2018 11:20 AM    [facility]

## 2018-10-10 DIAGNOSIS — I7090 Unspecified atherosclerosis: Secondary | ICD-10-CM | POA: Diagnosis not present

## 2018-10-10 DIAGNOSIS — Z951 Presence of aortocoronary bypass graft: Secondary | ICD-10-CM | POA: Diagnosis not present

## 2018-10-10 DIAGNOSIS — R42 Dizziness and giddiness: Secondary | ICD-10-CM | POA: Diagnosis not present

## 2018-10-10 DIAGNOSIS — E785 Hyperlipidemia, unspecified: Secondary | ICD-10-CM | POA: Diagnosis not present

## 2018-10-10 DIAGNOSIS — R55 Syncope and collapse: Secondary | ICD-10-CM | POA: Diagnosis not present

## 2018-10-11 LAB — LIPID PANEL
Chol/HDL Ratio: 3.2 ratio (ref 0.0–5.0)
Cholesterol, Total: 149 mg/dL (ref 100–199)
HDL: 47 mg/dL (ref >39)
LDL Calculated: 71 mg/dL (ref 0–99)
Triglycerides: 157 mg/dL — ABNORMAL HIGH (ref 0–149)
VLDL Cholesterol Cal: 31 mg/dL (ref 5–40)

## 2018-10-11 LAB — BASIC METABOLIC PANEL
BUN/Creatinine Ratio: 20 (ref 10–24)
BUN: 30 mg/dL — ABNORMAL HIGH (ref 8–27)
CO2: 22 mmol/L (ref 20–29)
Calcium: 9.5 mg/dL (ref 8.6–10.2)
Chloride: 101 mmol/L (ref 96–106)
Creatinine, Ser: 1.5 mg/dL — ABNORMAL HIGH (ref 0.76–1.27)
GFR calc Af Amer: 52 mL/min/{1.73_m2} — ABNORMAL LOW (ref >59)
GFR calc non Af Amer: 45 mL/min/{1.73_m2} — ABNORMAL LOW (ref >59)
Glucose: 95 mg/dL (ref 65–99)
Potassium: 4.8 mmol/L (ref 3.5–5.2)
Sodium: 139 mmol/L (ref 134–144)

## 2018-10-11 LAB — HEPATIC FUNCTION PANEL
ALT: 33 IU/L (ref 0–44)
AST: 25 IU/L (ref 0–40)
Albumin: 4.5 g/dL (ref 3.7–4.7)
Alkaline Phosphatase: 95 IU/L (ref 39–117)
Bilirubin Total: 0.7 mg/dL (ref 0.0–1.2)
Bilirubin, Direct: 0.17 mg/dL (ref 0.00–0.40)
Total Protein: 7.2 g/dL (ref 6.0–8.5)

## 2018-10-11 LAB — TSH: TSH: 3.08 u[IU]/mL (ref 0.450–4.500)

## 2018-10-15 ENCOUNTER — Other Ambulatory Visit: Payer: Self-pay | Admitting: Physician Assistant

## 2018-10-27 ENCOUNTER — Encounter (HOSPITAL_COMMUNITY): Payer: Medicare HMO

## 2018-10-27 ENCOUNTER — Ambulatory Visit: Payer: Medicare HMO | Admitting: Family

## 2018-11-11 ENCOUNTER — Other Ambulatory Visit: Payer: Self-pay

## 2018-11-11 DIAGNOSIS — I6521 Occlusion and stenosis of right carotid artery: Secondary | ICD-10-CM

## 2018-11-11 DIAGNOSIS — I2510 Atherosclerotic heart disease of native coronary artery without angina pectoris: Secondary | ICD-10-CM

## 2018-11-11 NOTE — Progress Notes (Signed)
car

## 2018-11-19 ENCOUNTER — Other Ambulatory Visit: Payer: Self-pay | Admitting: Physician Assistant

## 2018-11-19 DIAGNOSIS — N183 Chronic kidney disease, stage 3 unspecified: Secondary | ICD-10-CM

## 2018-11-19 DIAGNOSIS — I129 Hypertensive chronic kidney disease with stage 1 through stage 4 chronic kidney disease, or unspecified chronic kidney disease: Secondary | ICD-10-CM

## 2018-11-24 ENCOUNTER — Ambulatory Visit: Payer: Medicare HMO | Admitting: Family

## 2018-11-24 ENCOUNTER — Encounter (HOSPITAL_COMMUNITY): Payer: Medicare HMO

## 2019-02-12 ENCOUNTER — Other Ambulatory Visit: Payer: Self-pay | Admitting: Physician Assistant

## 2019-02-12 ENCOUNTER — Other Ambulatory Visit: Payer: Self-pay

## 2019-02-12 DIAGNOSIS — N183 Chronic kidney disease, stage 3 unspecified: Secondary | ICD-10-CM

## 2019-02-12 DIAGNOSIS — I129 Hypertensive chronic kidney disease with stage 1 through stage 4 chronic kidney disease, or unspecified chronic kidney disease: Secondary | ICD-10-CM

## 2019-02-12 MED ORDER — IRBESARTAN 75 MG PO TABS: 75.0000 mg | ORAL_TABLET | Freq: Every day | ORAL | 0 refills | Status: DC

## 2019-02-12 MED ORDER — TAMSULOSIN HCL 0.4 MG PO CAPS: 0.4000 mg | ORAL_CAPSULE | Freq: Every day | ORAL | 0 refills | Status: DC

## 2019-02-20 ENCOUNTER — Encounter: Payer: Self-pay | Admitting: Physician Assistant

## 2019-02-20 DIAGNOSIS — H40003 Preglaucoma, unspecified, bilateral: Secondary | ICD-10-CM | POA: Diagnosis not present

## 2019-02-25 ENCOUNTER — Ambulatory Visit: Payer: Medicare HMO | Admitting: Physician Assistant

## 2019-02-27 ENCOUNTER — Encounter: Payer: Self-pay | Admitting: Physician Assistant

## 2019-02-27 DIAGNOSIS — Z298 Encounter for other specified prophylactic measures: Secondary | ICD-10-CM

## 2019-03-02 ENCOUNTER — Other Ambulatory Visit: Payer: Self-pay

## 2019-03-02 ENCOUNTER — Ambulatory Visit (INDEPENDENT_AMBULATORY_CARE_PROVIDER_SITE_OTHER): Payer: Medicare HMO | Admitting: Physician Assistant

## 2019-03-02 ENCOUNTER — Encounter: Payer: Self-pay | Admitting: Physician Assistant

## 2019-03-02 VITALS — BP 109/74 | HR 73 | Temp 98.2°F | Wt 183.0 lb

## 2019-03-02 DIAGNOSIS — Z5320 Procedure and treatment not carried out because of patient's decision for unspecified reasons: Secondary | ICD-10-CM

## 2019-03-02 DIAGNOSIS — R69 Illness, unspecified: Secondary | ICD-10-CM | POA: Diagnosis not present

## 2019-03-02 DIAGNOSIS — N401 Enlarged prostate with lower urinary tract symptoms: Secondary | ICD-10-CM

## 2019-03-02 DIAGNOSIS — Z860100 Personal history of colon polyps, unspecified: Secondary | ICD-10-CM

## 2019-03-02 DIAGNOSIS — N183 Chronic kidney disease, stage 3 unspecified: Secondary | ICD-10-CM

## 2019-03-02 DIAGNOSIS — R338 Other retention of urine: Secondary | ICD-10-CM

## 2019-03-02 DIAGNOSIS — I10 Essential (primary) hypertension: Secondary | ICD-10-CM | POA: Diagnosis not present

## 2019-03-02 DIAGNOSIS — R7989 Other specified abnormal findings of blood chemistry: Secondary | ICD-10-CM

## 2019-03-02 DIAGNOSIS — Z5181 Encounter for therapeutic drug level monitoring: Secondary | ICD-10-CM | POA: Diagnosis not present

## 2019-03-02 DIAGNOSIS — Z1389 Encounter for screening for other disorder: Secondary | ICD-10-CM

## 2019-03-02 DIAGNOSIS — F482 Pseudobulbar affect: Secondary | ICD-10-CM

## 2019-03-02 DIAGNOSIS — Z125 Encounter for screening for malignant neoplasm of prostate: Secondary | ICD-10-CM

## 2019-03-02 DIAGNOSIS — Z8042 Family history of malignant neoplasm of prostate: Secondary | ICD-10-CM

## 2019-03-02 HISTORY — DX: Personal history of colonic polyps: Z86.010

## 2019-03-02 HISTORY — DX: Family history of malignant neoplasm of prostate: Z80.42

## 2019-03-02 HISTORY — DX: Personal history of colon polyps, unspecified: Z86.0100

## 2019-03-02 MED ORDER — AMOXICILLIN 500 MG PO TABS: ORAL_TABLET | ORAL | 0 refills | Status: DC

## 2019-03-02 MED ORDER — TAMSULOSIN HCL 0.4 MG PO CAPS: 0.4000 mg | ORAL_CAPSULE | Freq: Every day | ORAL | 1 refills | Status: DC

## 2019-03-02 NOTE — Patient Instructions (Signed)
Food Basics for Chronic Kidney Disease When your kidneys are not working well, they cannot remove waste and excess substances from your blood as effectively as they did before. This can lead to a buildup and imbalance of these substances, which can worsen kidney damage and affect how your body functions. Certain foods lead to a buildup of these substances in the body. By changing your diet as recommended by your diet and nutrition specialist (dietitian) or health care provider, you could help prevent further kidney damage and delay or prevent the need for dialysis. What are tips for following this plan? General instructions   Work with your health care provider and dietitian to develop a meal plan that is right for you. Foods you can eat, limit, or avoid will be different for each person depending on the stage of kidney disease and any other existing health conditions.  Talk with your health care provider about whether you should take a vitamin and mineral supplement.  Use standard measuring cups and spoons to measure servings of foods. Use a kitchen scale to measure portions of protein foods.  If directed by your health care provider, avoid drinking too much fluid. Measure and count all liquids, including water, ice, soups, flavored gelatin, and frozen desserts such as popsicles or ice cream. Reading food labels  Check the amount of sodium in foods. Choose foods that have less than 300 milligrams (mg) per serving.  Check the ingredient list for phosphorus or potassium-based additives or preservatives.  Check the amount of saturated and trans fat. Limit or avoid these fats as told by your dietitian. Shopping  Avoid buying foods that are: ? Processed, frozen, or prepackaged. ? Calcium-enriched or fortified.  Do not buy foods that have salt or sodium listed among the first five ingredients.  Do not buy canned vegetables. Cooking  Replace animal proteins, such as meat, fish, eggs, or  dairy, with plant proteins from beans, nuts, and soy. ? Use soy milk instead of cow's milk. ? Add beans or tofu to soups, casseroles, or pasta dishes instead of meat.  Soak vegetables, such as potatoes, before cooking to reduce potassium. To do this: ? Peel and cut into small pieces. ? Soak in warm water for at least 2 hours. For every 1 cup of vegetables, use 10 cups of water. ? Drain and rinse with warm water. ? Boil for at least 5 minutes. Meal planning  Limit the amount of protein from plant and animal sources you eat each day.  Do not add salt to food when cooking or before eating.  Eat meals and snacks at around the same time each day. If you have diabetes:  If you have diabetes (diabetes mellitus) and chronic kidney disease, it is important to keep your blood glucose in the target range recommended by your health care provider. Follow your diabetes management plan. This may include: ? Checking your blood glucose regularly. ? Taking oral medicines, insulin, or both. ? Exercising for at least 30 minutes on 5 or more days each week, or as told by your health care provider. ? Tracking how many servings of carbohydrates you eat at each meal.  You may be given specific guidelines on how much of certain foods and nutrients you may eat, depending on your stage of kidney disease and whether you have high blood pressure (hypertension). Follow your meal plan as told by your dietitian. What nutrients should be limited? The items listed are not a complete list. Talk with your dietitian   about what dietary choices are best for you. Potassium Potassium affects how steadily your heart beats. If too much potassium builds up in your blood, it can cause an irregular heartbeat or even a heart attack. You may need to eat less potassium, depending on your blood potassium levels and the stage of kidney disease. Talk to your dietitian about how much potassium you may have each day. You may need to limit  or avoid foods that are high in potassium, such as:  Milk and soy milk.  Fruits, such as bananas, papaya, apricots, nectarines, melon, prunes, raisins, kiwi, and oranges.  Vegetables, such as potatoes, sweet potatoes, yams, tomatoes, leafy greens, beets, okra, avocado, pumpkin, and winter squash.  White and lima beans. Phosphorus Phosphorus is a mineral found in your bones. A balance between calcium and phosphorous is needed to build and maintain healthy bones. Too much phosphorus pulls calcium from your bones. This can make your bones weak and more likely to break. Too much phosphorus can also make your skin itch. You may need to eat less phosphorus depending on your blood phosphorus levels and the stage of kidney disease. Talk to your dietitian about how much potassium you may have each day. You may need to take medicine to lower your blood phosphorus levels if diet changes do not help. You may need to limit or avoid foods that are high in phosphorus, such as:  Milk and dairy products.  Dried beans and peas.  Tofu, soy milk, and other soy-based meat replacements.  Colas.  Nuts and peanut butter.  Meat, poultry, and fish.  Bran cereals and oatmeals. Protein Protein helps you to make and keep muscle. It also helps in the repair of your body's cells and tissues. One of the natural breakdown products of protein is a waste product called urea. When your kidneys are not working properly, they cannot remove wastes, such as urea, like they did before you developed chronic kidney disease. Reducing how much protein you eat can help prevent a buildup of urea in your blood. Depending on your stage of kidney disease, you may need to limit foods that are high in protein. Sources of animal protein include:  Meat (all types).  Fish and seafood.  Poultry.  Eggs.  Dairy. Other protein foods include:  Beans and legumes.  Nuts and nut butter.  Soy and tofu. Sodium Sodium, which is found  in salt, helps maintain a healthy balance of fluids in your body. Too much sodium can increase your blood pressure and have a negative effect on the function of your heart and lungs. Too much sodium can also cause your body to retain too much fluid, making your kidneys work harder. Most people should have less than 2,300 milligrams (mg) of sodium each day. If you have hypertension, you may need to limit your sodium to 1,500 mg each day. Talk to your dietitian about how much sodium you may have each day. You may need to limit or avoid foods that are high in sodium, such as:  Salt seasonings.  Soy sauce.  Cured and processed meats.  Salted crackers and snack foods.  Fast food.  Canned soups and most canned foods.  Pickled foods.  Vegetable juice.  Boxed mixes or ready-to-eat boxed meals and side dishes.  Bottled dressings, sauces, and marinades. Summary  Chronic kidney disease can lead to a buildup and imbalance of waste and excess substances in the body. Certain foods lead to a buildup of these substances. By adjusting   your intake of these foods, you could help prevent more kidney damage and delay or prevent the need for dialysis.  Food adjustments are different for each person with chronic kidney disease. Work with a dietitian to set up nutrient goals and a meal plan that is right for you.  If you have diabetes and chronic kidney disease, it is important to keep your blood glucose in the target range recommended by your health care provider. This information is not intended to replace advice given to you by your health care provider. Make sure you discuss any questions you have with your health care provider. Document Released: 10/13/2002 Document Revised: 11/13/2018 Document Reviewed: 07/18/2016 Elsevier Patient Education  2020 Elsevier Inc.   Chronic Kidney Disease, Adult Chronic kidney disease (CKD) occurs when the kidneys become damaged slowly over a long period of time. The  kidneys are a pair of organs that do many important jobs in the body, including:  Removing waste and extra fluid from the blood to make urine.  Making hormones that maintain the amount of fluid in tissues and blood vessels.  Maintaining the right amount of fluids and chemicals in the body. A small amount of kidney damage may not cause problems, but a large amount of damage may make it hard or impossible for the kidneys to work the way they should. If steps are not taken to slow down kidney damage or to stop it from getting worse, the kidneys may stop working permanently (end-stage renal disease or ESRD). Most of the time, CKD does not go away, but it can often be controlled. People who have CKD are usually able to live normal lives. What are the causes? The most common causes of this condition are diabetes and high blood pressure (hypertension). Other causes include:  Heart and blood vessel (cardiovascular) disease.  Kidney diseases, such as: ? Glomerulonephritis. ? Interstitial nephritis. ? Polycystic kidney disease. ? Renal vascular disease.  Diseases that affect the immune system.  Genetic diseases.  Medicines that damage the kidneys, such as anti-inflammatory medicines.  Being around or being in contact with poisonous (toxic) substances.  A kidney or urinary infection that occurs again and again (recurs).  Vasculitis. This is swelling or inflammation of the blood vessels.  A problem with urine flow that may be caused by: ? Cancer. ? Having kidney stones more than one time. ? An enlarged prostate, in males. What increases the risk? You are more likely to develop this condition if you:  Are older than age 60.  Are male.  Are African-American, Hispanic, Asian, Pacific Islander, or American Indian.  Are a current or former smoker.  Are obese.  Have a family history of kidney disease or failure.  Often take medicines that are damaging to the kidneys. What are the  signs or symptoms? Symptoms of this condition include:  Swelling (edema) of the face, legs, ankles, or feet.  Tiredness (lethargy) and having less energy.  Nausea or vomiting.  Confusion or trouble concentrating.  Problems with urination, such as: ? Painful or burning feeling during urination. ? Decreased urine production. ? Frequent urination, especially at night. ? Bloody urine.  Muscle twitches and cramps, especially in the legs.  Shortness of breath.  Weakness.  Loss of appetite.  Metallic taste in the mouth.  Trouble sleeping.  Dry, itchy skin.  A low blood count (anemia).  Pale lining of the eyelids and surface of the eye (conjunctiva). Symptoms develop slowly and may not be obvious until the kidney   damage becomes severe. It is possible to have kidney disease for years without having any symptoms. How is this diagnosed? This condition may be diagnosed based on:  Blood tests.  Urine tests.  Imaging tests, such as an ultrasound or CT scan.  A test in which a sample of tissue is removed from the kidneys to be examined under a microscope (kidney biopsy). These test results will help your health care provider determine how serious the CKD is. How is this treated? There is no cure for most cases of this condition, but treatment usually relieves symptoms and prevents or slows the progression of the disease. Treatment may include:  Making diet changes, which may require you to avoid alcohol, salty foods (sodium), and foods that are high in potassium, calcium, and protein.  Medicines: ? To lower blood pressure. ? To control blood glucose. ? To relieve anemia. ? To relieve swelling. ? To protect your bones. ? To improve the balance of electrolytes in your blood.  Removing toxic waste from the body through types of dialysis, if the kidneys can no longer do their job (kidney failure).  Managing any other conditions that are causing your CKD or making it worse.  Follow these instructions at home: Medicines  Take over-the-counter and prescription medicines only as told by your health care provider. The dose of some medicines that you take may need to be adjusted.  Do not take any new medicines unless approved by your health care provider. Many medicines can worsen your kidney damage.  Do not take any vitamin and mineral supplements unless approved by your health care provider. Many nutritional supplements can worsen your kidney damage. General instructions  Follow your prescribed diet as told by your health care provider.  Do not use any products that contain nicotine or tobacco, such as cigarettes and e-cigarettes. If you need help quitting, ask your health care provider.  Monitor and track your blood pressure at home. Report changes in your blood pressure as told by your health care provider.  If you are being treated for diabetes, monitor and track your blood sugar (blood glucose) levels as told by your health care provider.  Maintain a healthy weight. If you need help with this, ask your health care provider.  Start or continue an exercise plan. Exercise at least 30 minutes a day, 5 days a week.  Keep your immunizations up to date as told by your health care provider.  Keep all follow-up visits as told by your health care provider. This is important. Where to find more information  American Association of Kidney Patients: www.aakp.org  National Kidney Foundation: www.kidney.org  American Kidney Fund: www.akfinc.org  [employer] [facility]: www.lifeoptions.org and www.kidneyschool.org Contact a health care provider if:  Your symptoms get worse.  You develop new symptoms. Get help right away if:  You develop symptoms of ESRD, which include: ? Headaches. ? Numbness in the hands or feet. ? Easy bruising. ? Frequent hiccups. ? Chest pain. ? Shortness of breath. ? Lack of menstruation, in women.  You have a fever.   You have decreased urine production.  You have pain or bleeding when you urinate. Summary  Chronic kidney disease (CKD) occurs when the kidneys become damaged slowly over a long period of time.  The most common causes of this condition are diabetes and high blood pressure (hypertension).  There is no cure for most cases of this condition, but treatment usually relieves symptoms and prevents or slows the progression of the   disease. Treatment may include a combination of medicines and lifestyle changes. This information is not intended to replace advice given to you by your health care provider. Make sure you discuss any questions you have with your health care provider. Document Released: 05/01/2008 Document Revised: 07/05/2017 Document Reviewed: 08/30/2016 Elsevier Patient Education  2020 Elsevier Inc.  

## 2019-03-02 NOTE — Progress Notes (Signed)
HPI:                                                                Leonard Padilla is a 73 y.o. male [facility] today for medication management  Pseudobular affect/hx of CVA: doing well on Sertraline 50 mg daily. Denies irritability, mood changes or sleep disturbance.  BPH: symptom onset 05/2016. Last evaluated by urology 09/2017. He did not tolerate Flomax bid and continues on Flomax 0.4 mg once daily. He had acute urinary retention in early 2019 during hospitalization s/p CABG. Reports symptoms of nocturia, hesitancy and incomplete emptying are unchanged. Denies dysuria or difficulty voiding. Family hx of prostate cancer in father.   CKD: taking Irbesartan 75 mg daily. Denies fatigue, weakness, headaches, pruritus.   HTN/CAD: also followed by Cardiology. His Metoprolol was discontinued in March 2020 due to near syncope and orthostasis. Reports he has not had any additional episodes of near syncope since that time.  Compliant with medications. Does not check BP's at home.  Denies vision change, headache, chest pain with exertion, orthopnea, lightheadedness, syncope and edema.    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 Followed by Vascular Surgery.  [facility] 03/02/2019 08/27/2018 05/27/2018 11/18/2017 04/24/2017  Decreased Interest 0 0 2 0 0  Down, Depressed, Hopeless 0 0 1 0 0  PHQ - 2 Score 0 0 3 0 0  Altered sleeping 0 0 0 - 0  Tired, decreased energy 0 0 0 - 1  Change in appetite 0 0 0 - 0  Feeling bad or failure about yourself  0 0 0 - 0  Trouble concentrating 1 1 1  - 0  Moving slowly or fidgety/restless 0 0 0 - 0  Suicidal thoughts 0 0 0 - 0  PHQ-9 Score 1 1 4  - 1    GAD 7 : Generalized Anxiety Score 08/27/2018 05/27/2018 04/29/2017 03/18/2017  Nervous, Anxious, on Edge 0 2 0 1  Control/stop worrying 0 0 0 0  Worry too much - different things 0 0 0 0  Trouble relaxing 0 0 0 0  Restless 0 0 0 0   Easily annoyed or irritable 0 2 1 3   Afraid - awful might happen 0 0 0 0  Total GAD 7 Score 0 4 1 4       Past Medical History:  Diagnosis Date  . Aortic stenosis    moderate AS 12/01/15 echo (Dr. Elberta Fortisamnitz)  . Arthritis   . CKD (chronic kidney disease) stage 3, GFR 30-59 ml/min (HCC) 08/04/2016  . CKD stage G3a/A1, GFR 45-59 and albumin creatinine ratio <30 mg/g (HCC) 08/04/2016  . Combined form of age-related cataract, both eyes 08/12/2018  . Coronary artery disease   . Dysthymia 03/18/2017  . 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 Essential hypertension 10/13/2015  . Flu 09/2015   influenza A  . Former smoker, stopped smoking in distant past   . GERD (gastroesophageal reflux disease)    occ  . Glaucoma suspect of both eyes 08/12/2018  . Heart murmur   . History of colon polyps   . Hyperlipidemia   . Hypertensive retinopathy of both eyes 08/12/2018  . PBA (pseudobulbar  affect) 01/08/2017  . Pneumonia 09/2015  . Posterior vitreous detachment, left eye 08/12/2018  . Postural dizziness with presyncope 08/27/2016  . Psoriasis   . Sleep apnea    to pick cpap tomorrow 12/13/15  . Stroke (HCC) 09/2015  . Symptomatic carotid artery stenosis 12/15/2015  . 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 Wears glasses    Past Surgical History:  Procedure Laterality Date  . AORTIC VALVE REPLACEMENT N/A 09/09/2017   Procedure: AORTIC VALVE REPLACEMENT (AVR) using a 23 Edwards Perimount Magna Ease Aortic Valve;  Surgeon: Delight OvensGerhardt, Edward B, MD;  Location: [facility] OR;  Service: Open Heart Surgery;  Laterality: N/A;  . COLONOSCOPY W/ BIOPSIES AND POLYPECTOMY    . CORONARY ARTERY BYPASS GRAFT N/A 09/09/2017   Procedure: CORONARY ARTERY BYPASS GRAFTING (CABG) x 4 using the left internal mammary artery and right greater saphenous vein harvested endoscopically.;  Surgeon: Delight OvensGerhardt, Edward B, MD;  [facility];  Service: Open Heart Surgery;  Laterality: N/A;  . ENDARTERECTOMY  Right 12/15/2015   Procedure: RIGHT CAROTID ENDARTERECTOMY WITH PATCH ANGIOPLASTY;  Surgeon: Nada LibmanVance W Brabham, MD;  Location: [facility] OR;  Service: Vascular;  Laterality: Right;  . PATCH ANGIOPLASTY Right 12/15/2015   Procedure: RIGHT CAROTID PATCH ANGIOPLASTY;  Surgeon: Nada LibmanVance W Brabham, MD;  [facility];  Service: Vascular;  Laterality: Right;  . RIGHT/LEFT HEART CATH AND CORONARY ANGIOGRAPHY N/A 08/23/2017   Procedure: RIGHT/LEFT HEART CATH AND CORONARY ANGIOGRAPHY;  Surgeon: SwazilandJordan, Peter M, MD;  [facility];  Service: Cardiovascular;  Laterality: N/A;  . TEE WITHOUT CARDIOVERSION N/A 09/09/2017   Procedure: TRANSESOPHAGEAL ECHOCARDIOGRAM (TEE);  Surgeon: Delight OvensGerhardt, Edward B, MD;  [facility];  Service: Open Heart Surgery;  Laterality: N/A;  . WISDOM TOOTH EXTRACTION     Social History   Tobacco Use  . Smoking status: Former Smoker    Quit date: 08/07/1983    Years since quitting: 35.5  . Smokeless tobacco: Never Used  Substance Use Topics  . Alcohol use: Yes    Comment: 1 glass of wine daily    family history includes Cancer in his father and mother.    ROS: negative except as noted in the HPI  Medications: Current Outpatient Medications  Medication Sig Dispense Refill  . amoxicillin (AMOXIL) 500 MG tablet 4 tabs (2000mg ) 1 hours prior to procedure 4 tablet 0  . aspirin EC 81 MG tablet Take 1 tablet (81 mg total) by mouth daily. 90 tablet 1  . atorvastatin (LIPITOR) 80 MG tablet TAKE 1 TABLET(80 MG) BY MOUTH AT BEDTIME 90 tablet 3  . clobetasol ointment (TEMOVATE) 0.05 % Apply 1 application topically 2 (two) times daily as needed. Do not use for more than 2 consecutive weeks. Cycle use 60 g 1  . fluticasone (FLONASE) 50 MCG/ACT nasal spray Place into both nostrils daily.    . irbesartan (AVAPRO) 75 MG tablet TAKE 1 TABLET BY MOUTH DAILY. DUE FOR FOLLOW UP VISIT WITH PCP. 90 tablet 0  . sertraline (ZOLOFT) 50 MG tablet Take 1 tablet (50 mg total) by mouth daily. 90  tablet 2  . tamsulosin (FLOMAX) 0.4 MG CAPS capsule Take 1 capsule (0.4 mg total) by mouth daily. 90 capsule 1   No current facility-administered medications for this visit.    Allergies  Allergen Reactions  . Viagra [Sildenafil Citrate] Other (See Comments)    COLOR DISCRIMINATION [DOSE RELATED] "blue sensation"  . Metoprolol Other (See Comments)    Orthostasis, near syncope  Objective:  BP 109/74   Pulse 73   Temp 98.2 F (36.8 C) (Oral)   Wt 183 lb (83 kg)   BMI 27.83 kg/m  Wt Readings from Last 3 Encounters:  03/02/19 183 lb (83 kg)  10/09/18 183 lb (83 kg)  08/27/18 181 lb (82.1 kg)   Temp Readings from Last 3 Encounters:  03/02/19 98.2 F (36.8 C) (Oral)  10/07/17 97.9 F (36.6 C) (Oral)  09/19/17 98.5 F (36.9 C) (Oral)   BP Readings from Last 3 Encounters:  03/02/19 109/74  10/09/18 126/82  08/27/18 134/82   Pulse Readings from Last 3 Encounters:  03/02/19 73  10/09/18 65  08/27/18 65    Gen:  alert, not ill-appearing, no distress, appropriate for age HEENT: head normocephalic without obvious abnormality, conjunctiva and cornea clear, trachea midline Pulm: Normal work of breathing, normal phonation, clear to auscultation bilaterally, no wheezes, rales or rhonchi CV: Normal rate, regular rhythm, s1 and s2 distinct, no murmurs, clicks or rubs; no carotid bruit Neuro: alert and oriented x 3, no tremor MSK: extremities atraumatic, normal gait and station, no peripheral edema Skin: intact, no rashes on exposed skin, no jaundice, no cyanosis Psych: cooperative, euthymic mood, affect mood-congruent, speech is articulate, normal rate and volume; thought processes clear and goal-directed, normal judgment, good insight   Lab Results  Component Value Date   CREATININE 1.50 (H) 10/10/2018   BUN 30 (H) 10/10/2018   NA 139 10/10/2018   K 4.8 10/10/2018   CL 101 10/10/2018   CO2 22 10/10/2018   Lab Results  Component Value Date   ALT 33 10/10/2018    AST 25 10/10/2018   ALKPHOS 95 10/10/2018   BILITOT 0.7 10/10/2018   Lab Results  Component Value Date   WBC 5.7 04/10/2018   HGB 13.7 04/10/2018   HCT 41.0 04/10/2018   MCV 93 04/10/2018   PLT 182 04/10/2018   Lab Results  Component Value Date   CHOL 149 10/10/2018   HDL 47 10/10/2018   LDLCALC 71 10/10/2018   TRIG 157 (H) 10/10/2018   CHOLHDL 3.2 10/10/2018   Lab Results  Component Value Date   PSA 0.7 04/24/2017   PSA 0.5 04/03/2016     Assessment and Plan: 73 y.o. male with   .Diagnoses and all orders for this visit:  Hypertension goal BP (blood pressure) < 140/90 -     Renal Profile with Estimated GFR -     Urine Microalbumin w/creat. ratio -     CBC  CKD (chronic kidney disease) stage 3, GFR 30-59 ml/min (HCC) -     Renal Profile with Estimated GFR -     Urine Microalbumin w/creat. ratio -     CBC  Colon cancer screening declined  History of colonic polyps  PBA (pseudobulbar affect)  Urinary retention due to benign prostatic hyperplasia -     tamsulosin (FLOMAX) 0.4 MG CAPS capsule; Take 1 capsule (0.4 mg total) by mouth daily. -     PSA  Screening PSA (prostate specific antigen) -     PSA  Family history of prostate cancer in father  Medication monitoring encounter -     Renal Profile with Estimated GFR -     Urine Microalbumin w/creat. ratio -     CBC   HTN, CKD BP goal <140/90 Blood pressure in range in office Continue irbesartan 75 mg daily for comorbid CKD Baseline Scr 1.29-1.33, he had a bump in Scr 4 months ago of 1.5  Recheck renal function and urine microalbumin today Counseled patient on general measures/diet for CKD We will continue to monitor renal function every 3 to 6 months  BPH  Due for biannual screening PSA in 2 months, will add this on today since he is having labs drawn Declined DRE Symptoms are stable on Flomax daily  Patient has a self-reported history of colon polyps.  He states that he self discontinued his  regular colonoscopies and does not intend to have any colon cancer screening in the future.  He states that "he figures he will die of something else before colon cancer."  Discussed importance of regular screening for early detection and discussed risks and benefits of screening.  He was offered fecal occult blood test and refused this as well.    Patient education and anticipatory guidance given Patient agrees with treatment plan Follow-up in 6 months or sooner as needed if symptoms worsen or fail to improve  Darlyne Russian PA-C

## 2019-03-03 LAB — CBC
HCT: 41.4 % (ref 38.5–50.0)
Hemoglobin: 13.7 g/dL (ref 13.2–17.1)
MCH: 31.4 pg (ref 27.0–33.0)
MCHC: 33.1 g/dL (ref 32.0–36.0)
MCV: 94.7 fL (ref 80.0–100.0)
MPV: 10 fL (ref 7.5–12.5)
Platelets: 204 10*3/uL (ref 140–400)
RBC: 4.37 10*6/uL (ref 4.20–5.80)
RDW: 11.6 % (ref 11.0–15.0)
WBC: 6.4 10*3/uL (ref 3.8–10.8)

## 2019-03-03 LAB — RENAL PROFILE WITH ESTIMATED GFR
Albumin: 4.5 g/dL (ref 3.6–5.1)
BUN/Creatinine Ratio: 20 (calc) (ref 6–22)
BUN: 36 mg/dL — ABNORMAL HIGH (ref 7–25)
CO2: 21 mmol/L (ref 20–32)
Calcium: 9.6 mg/dL (ref 8.6–10.3)
Chloride: 106 mmol/L (ref 98–110)
Creat: 1.77 mg/dL — ABNORMAL HIGH (ref 0.70–1.18)
GFR, Est African American: 43 mL/min/{1.73_m2} — ABNORMAL LOW (ref >=60)
GFR, Est Non African American: 37 mL/min/{1.73_m2} — ABNORMAL LOW (ref >=60)
Glucose, Bld: 98 mg/dL (ref 65–99)
Phosphorus: 3.4 mg/dL (ref 2.1–4.3)
Potassium: 5.3 mmol/L (ref 3.5–5.3)
Sodium: 138 mmol/L (ref 135–146)

## 2019-03-09 ENCOUNTER — Encounter: Payer: Self-pay | Admitting: Physician Assistant

## 2019-03-09 NOTE — Addendum Note (Signed)
Addended by: Nelson Chimes E on: 03/09/2019 08:09 AM   Modules accepted: Orders

## 2019-03-10 DIAGNOSIS — N401 Enlarged prostate with lower urinary tract symptoms: Secondary | ICD-10-CM | POA: Diagnosis not present

## 2019-03-10 DIAGNOSIS — R338 Other retention of urine: Secondary | ICD-10-CM | POA: Diagnosis not present

## 2019-03-10 DIAGNOSIS — Z1389 Encounter for screening for other disorder: Secondary | ICD-10-CM | POA: Diagnosis not present

## 2019-03-10 DIAGNOSIS — Z125 Encounter for screening for malignant neoplasm of prostate: Secondary | ICD-10-CM | POA: Diagnosis not present

## 2019-03-10 DIAGNOSIS — N183 Chronic kidney disease, stage 3 (moderate): Secondary | ICD-10-CM | POA: Diagnosis not present

## 2019-03-10 DIAGNOSIS — R7989 Other specified abnormal findings of blood chemistry: Secondary | ICD-10-CM | POA: Diagnosis not present

## 2019-03-13 ENCOUNTER — Encounter: Payer: Self-pay | Admitting: Physician Assistant

## 2019-03-16 ENCOUNTER — Encounter: Payer: Self-pay | Admitting: Physician Assistant

## 2019-03-16 NOTE — Telephone Encounter (Signed)
Evlyn Clines, unsure why this was not done by lab. I called [facility] and spoke with Adonis Huguenin who has added on the test. I have also advised pt via MyChart msg that this was added on.   Just sending as Juluis Rainier

## 2019-03-17 LAB — RENAL PROFILE WITH ESTIMATED GFR
Albumin: 4.1 g/dL (ref 3.6–5.1)
BUN/Creatinine Ratio: 19 (calc) (ref 6–22)
BUN: 27 mg/dL — ABNORMAL HIGH (ref 7–25)
CO2: 24 mmol/L (ref 20–32)
Calcium: 9.3 mg/dL (ref 8.6–10.3)
Chloride: 108 mmol/L (ref 98–110)
Creat: 1.42 mg/dL — ABNORMAL HIGH (ref 0.70–1.18)
GFR, Est African American: 56 mL/min/{1.73_m2} — ABNORMAL LOW (ref >=60)
GFR, Est Non African American: 48 mL/min/{1.73_m2} — ABNORMAL LOW (ref >=60)
Glucose, Bld: 96 mg/dL (ref 65–99)
Phosphorus: 3.1 mg/dL (ref 2.1–4.3)
Potassium: 4.5 mmol/L (ref 3.5–5.3)
Sodium: 139 mmol/L (ref 135–146)

## 2019-03-17 LAB — URINALYSIS, ROUTINE W REFLEX MICROSCOPIC
Bilirubin Urine: NEGATIVE
Glucose, UA: NEGATIVE
Hgb urine dipstick: NEGATIVE
Ketones, ur: NEGATIVE
Leukocytes,Ua: NEGATIVE
Nitrite: NEGATIVE
Protein, ur: NEGATIVE
Specific Gravity, Urine: 1.021 (ref 1.001–1.03)
pH: <=5 (ref 5.0–8.0)

## 2019-03-17 LAB — TEST AUTHORIZATION

## 2019-03-17 LAB — MICROALBUMIN / CREATININE URINE RATIO
Creatinine, Urine: 112 mg/dL (ref 20–320)
Microalb Creat Ratio: 2 mcg/mg creat (ref <30)
Microalb, Ur: 0.2 mg/dL

## 2019-03-17 LAB — PSA: PSA: 0.6 ng/mL (ref <=4.0)

## 2019-03-27 ENCOUNTER — Other Ambulatory Visit: Payer: Self-pay

## 2019-03-27 ENCOUNTER — Ambulatory Visit (INDEPENDENT_AMBULATORY_CARE_PROVIDER_SITE_OTHER): Payer: Medicare HMO | Admitting: Physician Assistant

## 2019-03-27 DIAGNOSIS — Z23 Encounter for immunization: Secondary | ICD-10-CM | POA: Diagnosis not present

## 2019-04-04 ENCOUNTER — Other Ambulatory Visit: Payer: Self-pay | Admitting: Physician Assistant

## 2019-04-04 ENCOUNTER — Encounter: Payer: Self-pay | Admitting: Physician Assistant

## 2019-04-04 DIAGNOSIS — Z298 Encounter for other specified prophylactic measures: Secondary | ICD-10-CM

## 2019-04-04 DIAGNOSIS — Z2989 Encounter for other specified prophylactic measures: Secondary | ICD-10-CM

## 2019-04-06 MED ORDER — AMOXICILLIN 500 MG PO TABS: ORAL_TABLET | ORAL | 0 refills | Status: DC

## 2019-04-07 ENCOUNTER — Other Ambulatory Visit: Payer: Self-pay | Admitting: Physician Assistant

## 2019-04-07 ENCOUNTER — Encounter: Payer: Self-pay | Admitting: Physician Assistant

## 2019-04-07 DIAGNOSIS — Z298 Encounter for other specified prophylactic measures: Secondary | ICD-10-CM

## 2019-04-08 ENCOUNTER — Ambulatory Visit: Payer: Medicare HMO | Admitting: Cardiology

## 2019-04-08 MED ORDER — AMOXICILLIN 500 MG PO TABS: ORAL_TABLET | ORAL | 0 refills | Status: DC

## 2019-04-29 ENCOUNTER — Other Ambulatory Visit: Payer: Self-pay

## 2019-04-29 ENCOUNTER — Encounter: Payer: Self-pay | Admitting: Cardiology

## 2019-04-29 ENCOUNTER — Ambulatory Visit (INDEPENDENT_AMBULATORY_CARE_PROVIDER_SITE_OTHER): Payer: Medicare HMO | Admitting: Cardiology

## 2019-04-29 VITALS — BP 118/58 | HR 78 | Ht 69.0 in | Wt 185.0 lb

## 2019-04-29 DIAGNOSIS — Z951 Presence of aortocoronary bypass graft: Secondary | ICD-10-CM | POA: Diagnosis not present

## 2019-04-29 DIAGNOSIS — E785 Hyperlipidemia, unspecified: Secondary | ICD-10-CM | POA: Diagnosis not present

## 2019-04-29 DIAGNOSIS — Z9889 Other specified postprocedural states: Secondary | ICD-10-CM | POA: Diagnosis not present

## 2019-04-29 NOTE — Progress Notes (Signed)
Cardiology Office Note:    Date:  04/29/2019   ID:  Avantae, [name] 1946-03-07, MRN [id number]  PCP:  Trixie Dredge, PA-C  Cardiologist:  Jenean Lindau, MD   Referring MD: Ottis Stain*    ASSESSMENT:    1. Dyslipidemia, goal LDL below 70   2. Status post four vessel coronary artery bypass   3. Status post carotid endarterectomy    PLAN:    In order of problems listed above:  1. Coronary artery disease: Secondary prevention stressed with the patient.  Importance of compliance with diet and medication stressed and he vocalized understanding. 2. Essential hypertension: Blood pressure stable 3. Mixed dyslipidemia: Diet and importance of regular exercise stressed.  He will be back in the next few days for a liver lipid check 4. Patient will be seen in follow-up appointment in 6 months or earlier if the patient has any concerns    Medication Adjustments/Labs and Tests Ordered: Current medicines are reviewed at length with the patient today.  Concerns regarding medicines are outlined above.  No orders of the defined types were placed in this encounter.  No orders of the defined types were placed in this encounter.    No chief complaint on file.    History of Present Illness:    Leonard Padilla is a 73 y.o. male.  Patient has past medical history of coronary artery disease and essential hypertension and dyslipidemia.  She denies any problems at this time and takes care of activities of daily living.  No chest pain orthopnea or PND.  He leads a sedentary lifestyle.  At the time of my evaluation, the patient is alert awake oriented and in no distress.  Past Medical History:  Diagnosis Date  . Aortic stenosis    moderate AS 12/01/15 echo (Dr. Curt Bears)  . Arthritis   . CKD (chronic kidney disease) stage 3, GFR 30-59 ml/min (HCC) 08/04/2016  . CKD stage G3a/A1, GFR 45-59 and albumin creatinine ratio <30 mg/g ([place]) 08/04/2016  . Combined form of  age-related cataract, both eyes 08/12/2018  . Coronary artery disease   . Dysthymia 03/18/2017  . Ectatic abdominal aorta ([place]) 05/07/2017   Mild, repeat in 5 years (2023)  . Essential hypertension 10/13/2015  . Flu 09/2015   influenza A  . Former smoker, stopped smoking in distant past   . GERD (gastroesophageal reflux disease)    occ  . Glaucoma suspect of both eyes 08/12/2018  . Heart murmur   . History of colon polyps   . Hyperlipidemia   . Hypertensive retinopathy of both eyes 08/12/2018  . 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 Pneumonia 09/2015  . Posterior vitreous detachment, left eye 08/12/2018  . Postural dizziness with presyncope 08/27/2016  . Psoriasis   . Sleep apnea    to pick cpap tomorrow 12/13/15  . Stroke ([place]) 09/2015  . Symptomatic carotid artery stenosis 12/15/2015  . 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 Wears glasses     Past Surgical History:  Procedure Laterality Date  . AORTIC VALVE REPLACEMENT N/A 09/09/2017   Procedure: AORTIC VALVE REPLACEMENT (AVR) using a 23 Edwards Perimount Magna Ease Aortic Valve;  Surgeon: Grace Isaac, MD;  Location: [place];  Service: Open Heart Surgery;  Laterality: N/A;  . COLONOSCOPY W/ BIOPSIES AND POLYPECTOMY    . CORONARY ARTERY BYPASS GRAFT N/A 09/09/2017   Procedure: CORONARY ARTERY BYPASS GRAFTING (CABG) x 4 using the left internal mammary artery and right greater saphenous vein  harvested endoscopically.;  Surgeon: Delight Ovens, MD;  Location: [facility] OR;  Service: Open Heart Surgery;  Laterality: N/A;  . ENDARTERECTOMY Right 12/15/2015   Procedure: RIGHT CAROTID ENDARTERECTOMY WITH PATCH ANGIOPLASTY;  Surgeon: Nada Libman, MD;  Location: [facility] OR;  Service: Vascular;  Laterality: Right;  . PATCH ANGIOPLASTY Right 12/15/2015   Procedure: RIGHT CAROTID PATCH ANGIOPLASTY;  Surgeon: Nada Libman, MD;  Location: [facility] OR;  Service: Vascular;  Laterality: Right;  . RIGHT/LEFT HEART CATH AND CORONARY ANGIOGRAPHY N/A 08/23/2017    Procedure: RIGHT/LEFT HEART CATH AND CORONARY ANGIOGRAPHY;  Surgeon: Swaziland, Peter M, MD;  Location: [facility] INVASIVE CV LAB;  Service: Cardiovascular;  Laterality: N/A;  . TEE WITHOUT CARDIOVERSION N/A 09/09/2017   Procedure: TRANSESOPHAGEAL ECHOCARDIOGRAM (TEE);  Surgeon: Delight Ovens, MD;  Location: [facility] OR;  Service: Open Heart Surgery;  Laterality: N/A;  . WISDOM TOOTH EXTRACTION      Current Medications: No outpatient medications have been marked as taking for the 04/29/19 encounter (Office Visit) with Revankar, Aundra Dubin, MD.     Allergies:   Viagra [sildenafil citrate] and Metoprolol   Social History   Socioeconomic History  . Marital status: Married    Spouse name: [name]  . Number of children: 2  . Years of education: 64  . Highest education level: Not on file  Occupational History  . Occupation: Retired    Comment: retired Clinical biochemist, [employer]  Social Needs  . Financial resource strain: Not on file  . Food insecurity    Worry: Not on file    Inability: Not on file  . Transportation needs    Medical: Not on file    Non-medical: Not on file  Tobacco Use  . Smoking status: Former Smoker    Quit date: 08/07/1983    Years since quitting: 35.7  . Smokeless tobacco: Never Used  Substance and Sexual Activity  . Alcohol use: Yes    Comment: 1 glass of wine daily   . Drug use: No  . Sexual activity: Not Currently  Lifestyle  . Physical activity    Days per week: Not on file    Minutes per session: Not on file  . Stress: Not on file  Relationships  . Social Musician on phone: Not on file    Gets together: Not on file    Attends religious service: Not on file    Active member of club or organization: Not on file    Attends meetings of clubs or organizations: Not on file    Relationship status: Not on file  Other Topics Concern  . Not on file  Social History Narrative   Lives with wife   Caffeine use- coffee 3-4 cups daily     Family History: The  patient's family history includes Cancer in his father and mother.  ROS:   Please see the history of present illness.    All other systems reviewed and are negative.  EKGs/Labs/Other Studies Reviewed:    The following studies were reviewed today: I reviewed previous records and lab work.  Patient also has renal insufficiency and followed by primary care physician.   Recent Labs: 10/10/2018: ALT 33; TSH 3.080 03/02/2019: Hemoglobin 13.7; Platelets 204 03/10/2019: BUN 27; Creat 1.42; Potassium 4.5; Sodium 139  Recent Lipid Panel    Component Value Date/Time   CHOL 149 10/10/2018 1200   TRIG 157 (H) 10/10/2018 1200   HDL 47 10/10/2018 1200   CHOLHDL 3.2 10/10/2018 1200  CHOLHDL 2.9 11/08/2017 1334   VLDL 23 08/03/2016 1115   LDLCALC 71 10/10/2018 1200   LDLCALC 73 11/08/2017 1334    Physical Exam:    VS:  BP (!) 118/58 (BP Location: Left Arm, Patient Position: Sitting, Cuff Size: Normal)   Pulse 78   Ht 5\' 9"  (1.753 m)   Wt 185 lb (83.9 kg)   SpO2 98%   BMI 27.32 kg/m     Wt Readings from Last 3 Encounters:  04/29/19 185 lb (83.9 kg)  03/02/19 183 lb (83 kg)  10/09/18 183 lb (83 kg)     GEN: Patient is in no acute distress HEENT: Normal NECK: No JVD; No carotid bruits LYMPHATICS: No lymphadenopathy CARDIAC: Hear sounds regular, 2/6 systolic murmur at the apex. RESPIRATORY:  Clear to auscultation without rales, wheezing or rhonchi  ABDOMEN: Soft, non-tender, non-distended MUSCULOSKELETAL:  No edema; No deformity  SKIN: Warm and dry NEUROLOGIC:  Alert and oriented x 3 PSYCHIATRIC:  Normal affect   Signed, Garwin Brothers, MD  04/29/2019 11:47 AM    [facility]

## 2019-04-29 NOTE — Patient Instructions (Signed)
Medication Instructions:  Your physician recommends that you continue on your current medications as directed. Please refer to the Current Medication list given to you today.  If you need a refill on your cardiac medications before your next appointment, please call your pharmacy.   Lab work: Your physician recommends that you have a hepatic and lipid drawn today. If you have labs (blood work) drawn today and your tests are completely normal, you will receive your results only by: Marland Kitchen MyChart Message (if you have MyChart) OR . A paper copy in the mail If you have any lab test that is abnormal or we need to change your treatment, we will call you to review the results.  Testing/Procedures: NONE  Follow-Up: At [facility], you and your health needs are our priority.  As part of our continuing mission to provide you with exceptional heart care, we have created designated Provider Care Teams.  These Care Teams include your primary Cardiologist (physician) and Advanced Practice Providers (APPs -  Physician Assistants and Nurse Practitioners) who all work together to provide you with the care you need, when you need it. You will need a follow up appointment in 6 months.

## 2019-04-29 NOTE — Addendum Note (Signed)
Addended by: Beckey Rutter on: 04/29/2019 12:01 PM   Modules accepted: Orders

## 2019-04-30 DIAGNOSIS — Z9889 Other specified postprocedural states: Secondary | ICD-10-CM | POA: Diagnosis not present

## 2019-04-30 DIAGNOSIS — Z951 Presence of aortocoronary bypass graft: Secondary | ICD-10-CM | POA: Diagnosis not present

## 2019-04-30 DIAGNOSIS — E785 Hyperlipidemia, unspecified: Secondary | ICD-10-CM | POA: Diagnosis not present

## 2019-05-01 LAB — HEPATIC FUNCTION PANEL
ALT: 17 IU/L (ref 0–44)
AST: 21 IU/L (ref 0–40)
Albumin: 4.5 g/dL (ref 3.7–4.7)
Alkaline Phosphatase: 110 IU/L (ref 39–117)
Bilirubin Total: 0.5 mg/dL (ref 0.0–1.2)
Bilirubin, Direct: 0.15 mg/dL (ref 0.00–0.40)
Total Protein: 6.6 g/dL (ref 6.0–8.5)

## 2019-05-01 LAB — LIPID PANEL
Chol/HDL Ratio: 2.5 ratio (ref 0.0–5.0)
Cholesterol, Total: 135 mg/dL (ref 100–199)
HDL: 55 mg/dL (ref >39)
LDL Chol Calc (NIH): 62 mg/dL (ref 0–99)
Triglycerides: 99 mg/dL (ref 0–149)
VLDL Cholesterol Cal: 18 mg/dL (ref 5–40)

## 2019-05-09 ENCOUNTER — Other Ambulatory Visit: Payer: Self-pay | Admitting: Physician Assistant

## 2019-05-09 DIAGNOSIS — N183 Chronic kidney disease, stage 3 unspecified: Secondary | ICD-10-CM

## 2019-05-09 DIAGNOSIS — I129 Hypertensive chronic kidney disease with stage 1 through stage 4 chronic kidney disease, or unspecified chronic kidney disease: Secondary | ICD-10-CM

## 2019-05-12 ENCOUNTER — Other Ambulatory Visit: Payer: Self-pay | Admitting: Family Medicine

## 2019-05-12 DIAGNOSIS — N183 Chronic kidney disease, stage 3 unspecified: Secondary | ICD-10-CM

## 2019-05-12 DIAGNOSIS — I129 Hypertensive chronic kidney disease with stage 1 through stage 4 chronic kidney disease, or unspecified chronic kidney disease: Secondary | ICD-10-CM

## 2019-05-13 ENCOUNTER — Other Ambulatory Visit: Payer: Self-pay | Admitting: *Deleted

## 2019-05-13 DIAGNOSIS — Z298 Encounter for other specified prophylactic measures: Secondary | ICD-10-CM

## 2019-05-19 ENCOUNTER — Other Ambulatory Visit: Payer: Self-pay

## 2019-05-19 DIAGNOSIS — I129 Hypertensive chronic kidney disease with stage 1 through stage 4 chronic kidney disease, or unspecified chronic kidney disease: Secondary | ICD-10-CM

## 2019-05-19 DIAGNOSIS — N401 Enlarged prostate with lower urinary tract symptoms: Secondary | ICD-10-CM

## 2019-05-19 DIAGNOSIS — N183 Chronic kidney disease, stage 3 unspecified: Secondary | ICD-10-CM

## 2019-05-19 MED ORDER — IRBESARTAN 75 MG PO TABS: ORAL_TABLET | ORAL | 1 refills | Status: DC

## 2019-05-21 ENCOUNTER — Telehealth: Payer: Self-pay

## 2019-05-21 NOTE — Telephone Encounter (Signed)
Results relayed, copy sent to Dr. Maisie Fus

## 2019-05-21 NOTE — Telephone Encounter (Signed)
-----   Message from Jenean Lindau, MD sent at 05/01/2019  8:17 AM EDT ----- The results of the study is unremarkable. Please inform patient. I will discuss in detail at next appointment. Cc  primary care/referring physician Jenean Lindau, MD 05/01/2019 8:16 AM

## 2019-06-05 ENCOUNTER — Other Ambulatory Visit: Payer: Self-pay | Admitting: Physician Assistant

## 2019-06-05 DIAGNOSIS — F341 Dysthymic disorder: Secondary | ICD-10-CM

## 2019-06-05 DIAGNOSIS — F482 Pseudobulbar affect: Secondary | ICD-10-CM

## 2019-06-05 DIAGNOSIS — R454 Irritability and anger: Secondary | ICD-10-CM

## 2019-06-19 IMAGING — DX DG CHEST 2V
2 series · 2 of 2 positions shown · non-contrast
Comparison: None.

CLINICAL DATA: Midsternal chest pain.

EXAM:
CHEST  2 VIEW

[chest pa]
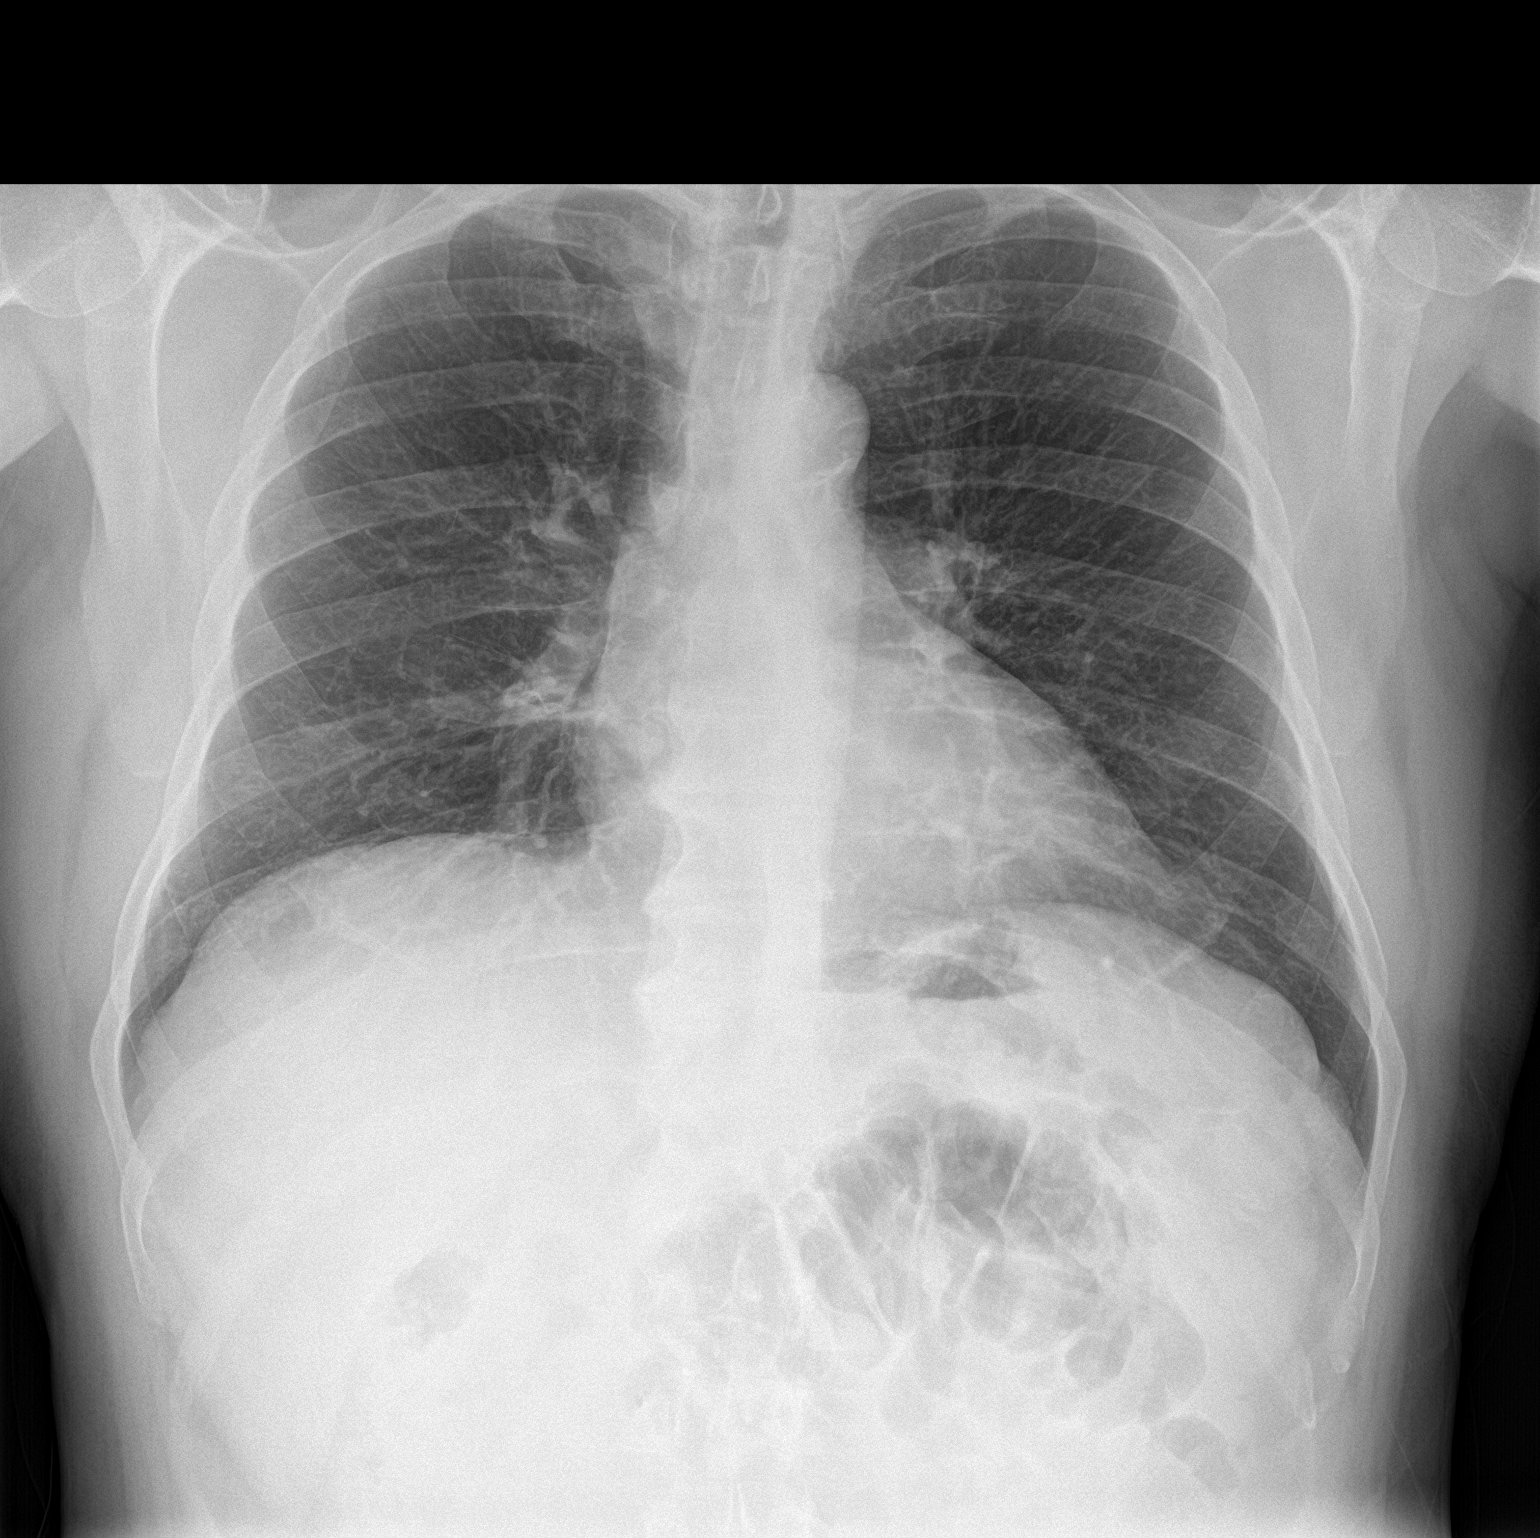

[chest lat]
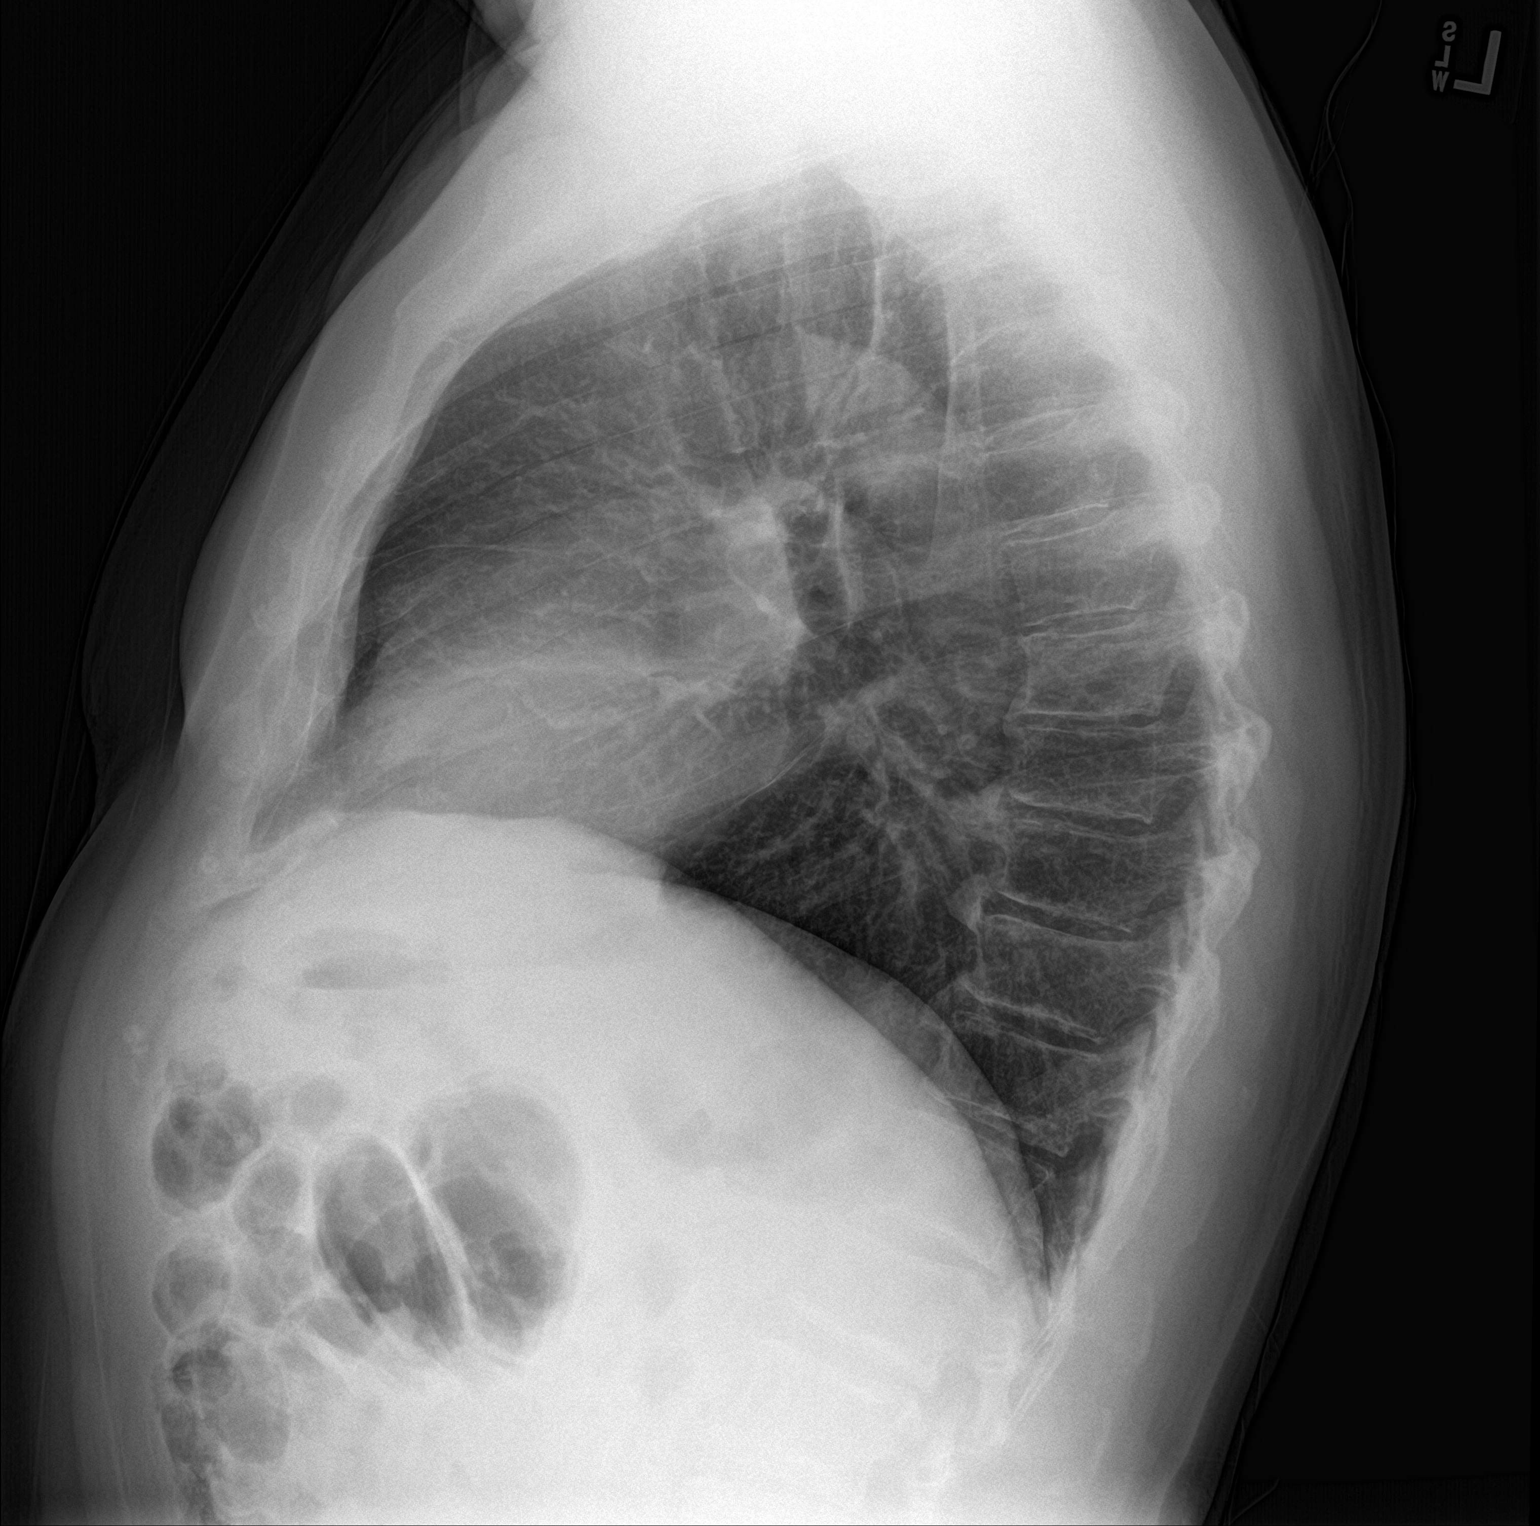

[2 of 2 positions shown; findings below may reference images not displayed]

FINDINGS: Normal heart size and mediastinal contours. No acute infiltrate or
edema. No effusion or pneumothorax. Spondylosis. No acute osseous
findings.
IMPRESSION: No evidence of active disease.

## 2019-07-17 IMAGING — DX DG CHEST 1V PORT
1 series · 1 of 1 positions shown · non-contrast
Comparison: 09/04/2017

CLINICAL DATA: Status post coronary bypass grafting and aortic
valve replacement

EXAM:
PORTABLE CHEST 1 VIEW

[chest ap]
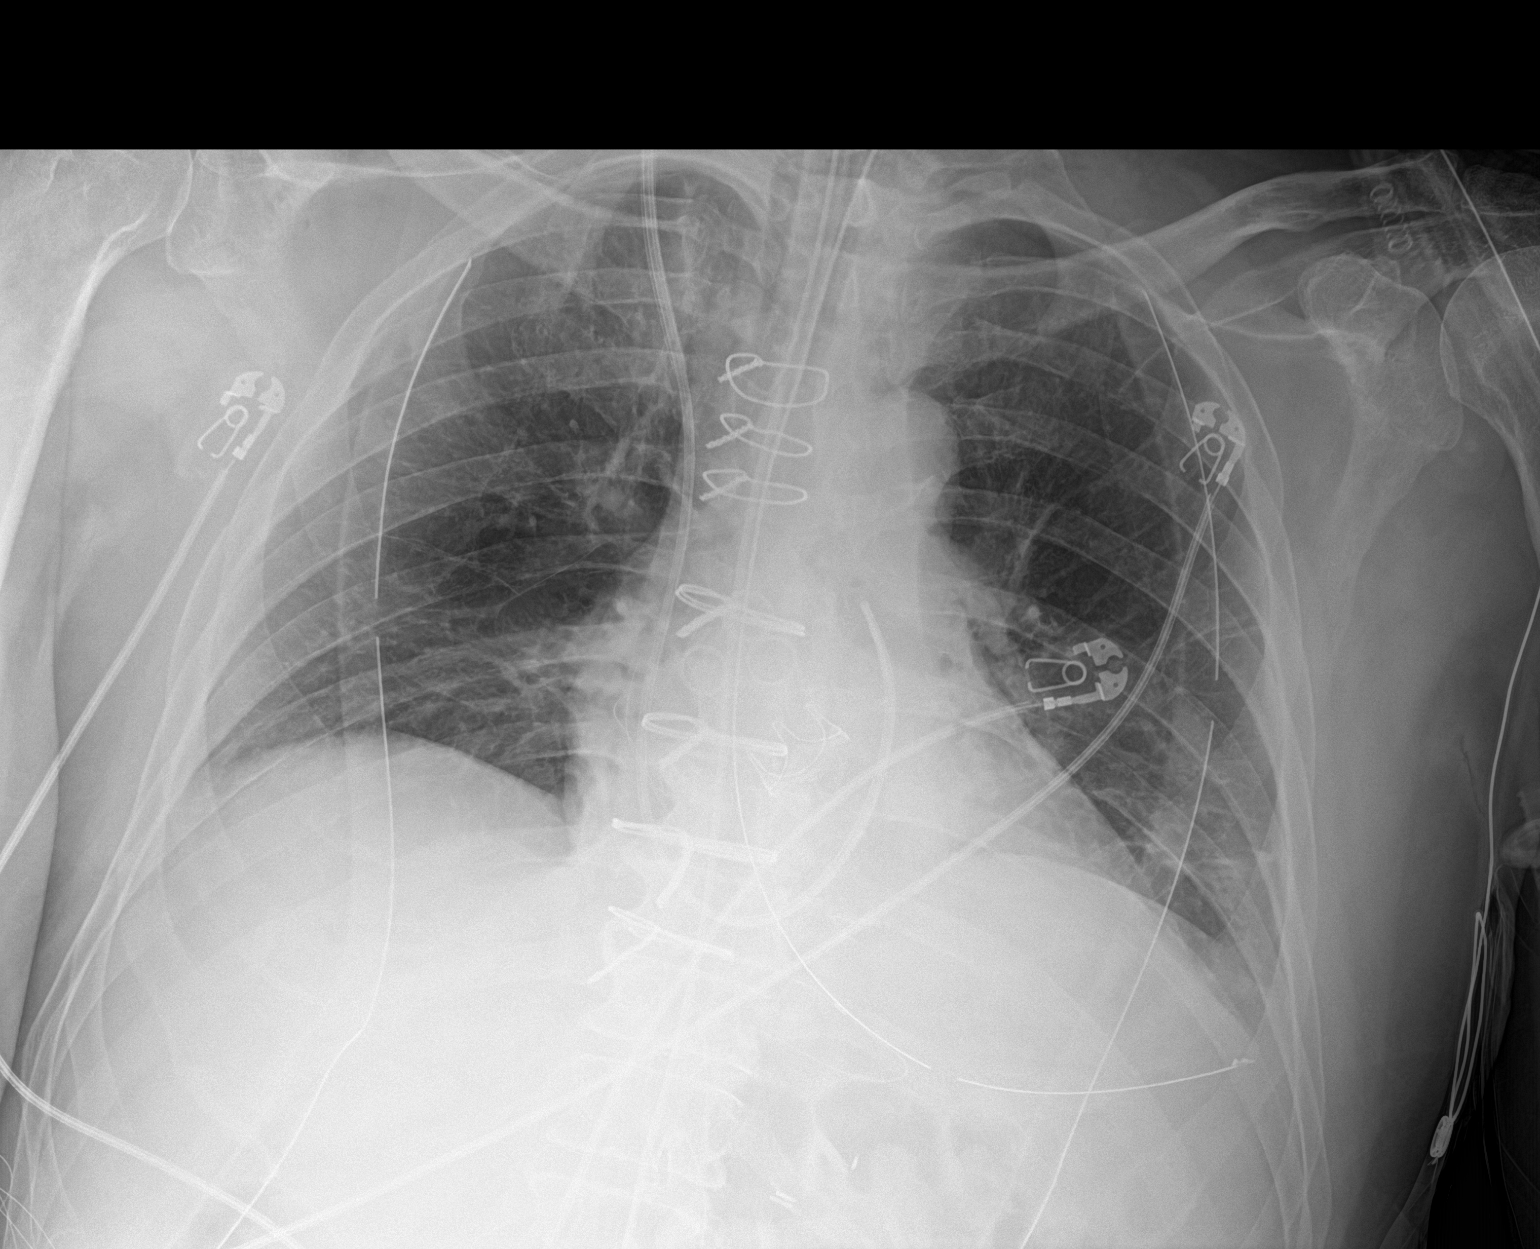

[1 of 1 positions shown; findings below may reference images not displayed]

FINDINGS: Postsurgical changes are now seen. Swan-Ganz catheter is noted in
the pulmonary outflow tract. Endotracheal tube, nasogastric catheter
and mediastinal drain are noted in satisfactory position. Bilateral
thoracostomy catheters are seen. No pneumothorax is noted. Very
minimal basilar atelectasis is seen. No other focal abnormality is
noted.
IMPRESSION: Tubes and lines as described above.  Minimal basilar atelectasis.

## 2019-07-18 IMAGING — DX DG CHEST 1V PORT
1 series · 1 of 1 positions shown · non-contrast
Comparison: 09/09/2017.

CLINICAL DATA: 71-year-old male post CABG.  Subsequent encounter.

EXAM:
PORTABLE CHEST 1 VIEW

[chest ap]
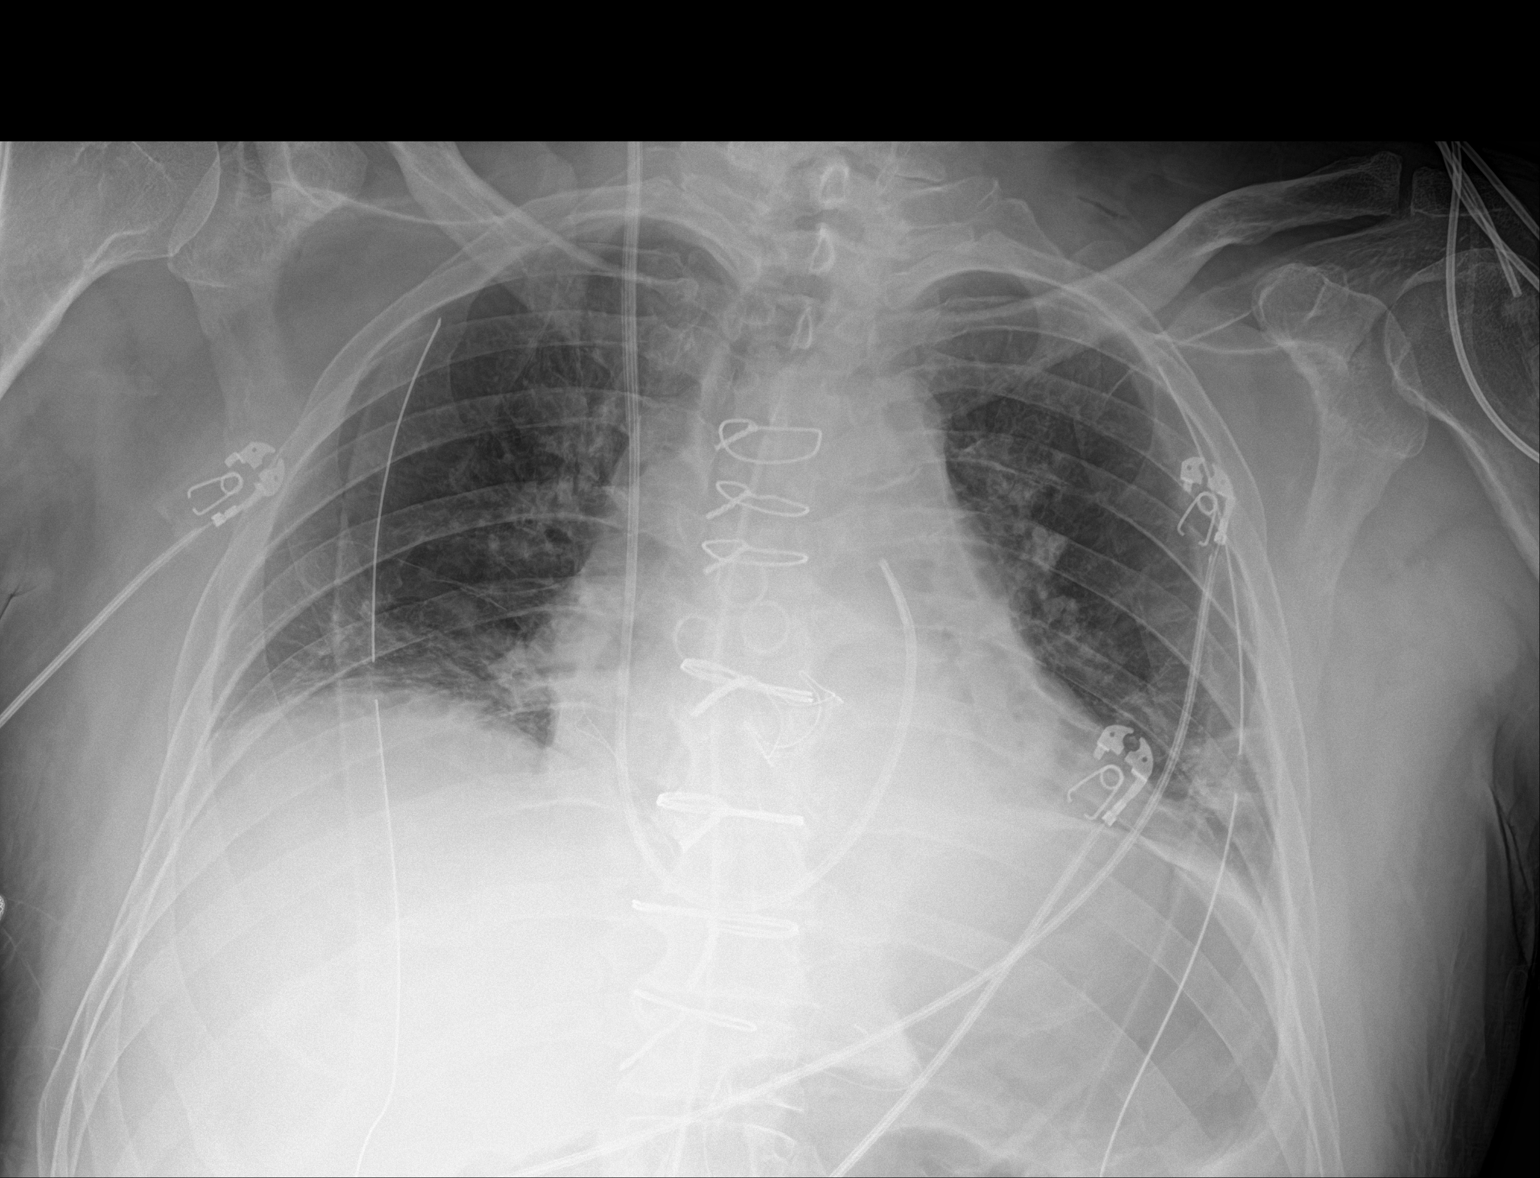

[1 of 1 positions shown; findings below may reference images not displayed]

FINDINGS: Endotracheal tube and nasogastric tube have been removed. Interval
development of gas filled prominent size stomach.

Right-sided Swan-Ganz catheter tip pulmonary outflow tract/main
pulmonary artery level.

Bilateral chest tubes in place without pneumothorax detected.

Bibasilar atelectasis.

Central pulmonary vascular prominence.

Post CABG/valve replacement.  Mild cardiomegaly.
IMPRESSION: Endotracheal tube and nasogastric tube have been removed. Interval
development of gas filled prominent size stomach.

Post CABG and valve replacement.  Mild cardiomegaly.

Bibasilar subsegmental atelectasis.

Bilateral chest tubes in place without pneumothorax.

## 2019-08-20 ENCOUNTER — Ambulatory Visit: Payer: Medicare Other | Attending: Internal Medicine

## 2019-08-20 DIAGNOSIS — Z23 Encounter for immunization: Secondary | ICD-10-CM | POA: Insufficient documentation

## 2019-08-20 NOTE — Progress Notes (Signed)
   Covid-19 Vaccination Clinic  Name:  KORDELL JAFRI    MRN: [id number] DOB: 11-27-1945  08/20/2019  Mr. Sroka was observed post Covid-19 immunization for 15 minutes without incidence. He was provided with Vaccine Information Sheet and instruction to access the V-Safe system.   Mr. Dermody was instructed to call 911 with any severe reactions post vaccine: Marland Kitchen Difficulty breathing  . Swelling of your face and throat  . A fast heartbeat  . A bad rash all over your body  . Dizziness and weakness    Immunizations Administered    Name Date Dose VIS Date Route   Pfizer COVID-19 Vaccine 08/20/2019 12:33 PM 0.3 mL 07/17/2019 Intramuscular   Manufacturer: [employer], [employer]   Lot: [id number]   NDC: [address]-0

## 2019-08-21 DIAGNOSIS — H02834 Dermatochalasis of left upper eyelid: Secondary | ICD-10-CM | POA: Diagnosis not present

## 2019-08-21 DIAGNOSIS — H527 Unspecified disorder of refraction: Secondary | ICD-10-CM | POA: Diagnosis not present

## 2019-08-21 DIAGNOSIS — H02831 Dermatochalasis of right upper eyelid: Secondary | ICD-10-CM | POA: Diagnosis not present

## 2019-08-21 DIAGNOSIS — H43813 Vitreous degeneration, bilateral: Secondary | ICD-10-CM | POA: Diagnosis not present

## 2019-08-21 DIAGNOSIS — H35033 Hypertensive retinopathy, bilateral: Secondary | ICD-10-CM | POA: Diagnosis not present

## 2019-08-21 DIAGNOSIS — H40003 Preglaucoma, unspecified, bilateral: Secondary | ICD-10-CM | POA: Diagnosis not present

## 2019-08-21 DIAGNOSIS — H25813 Combined forms of age-related cataract, bilateral: Secondary | ICD-10-CM | POA: Diagnosis not present

## 2019-08-24 IMAGING — CR DG CHEST 2V
2 series · 2 of 2 positions shown · non-contrast
Comparison: Chest x-ray of 10/02/2017

CLINICAL DATA: Post aortic valve replacement on 09/09/2017

EXAM:
CHEST - 2 VIEW

[w chest pa]
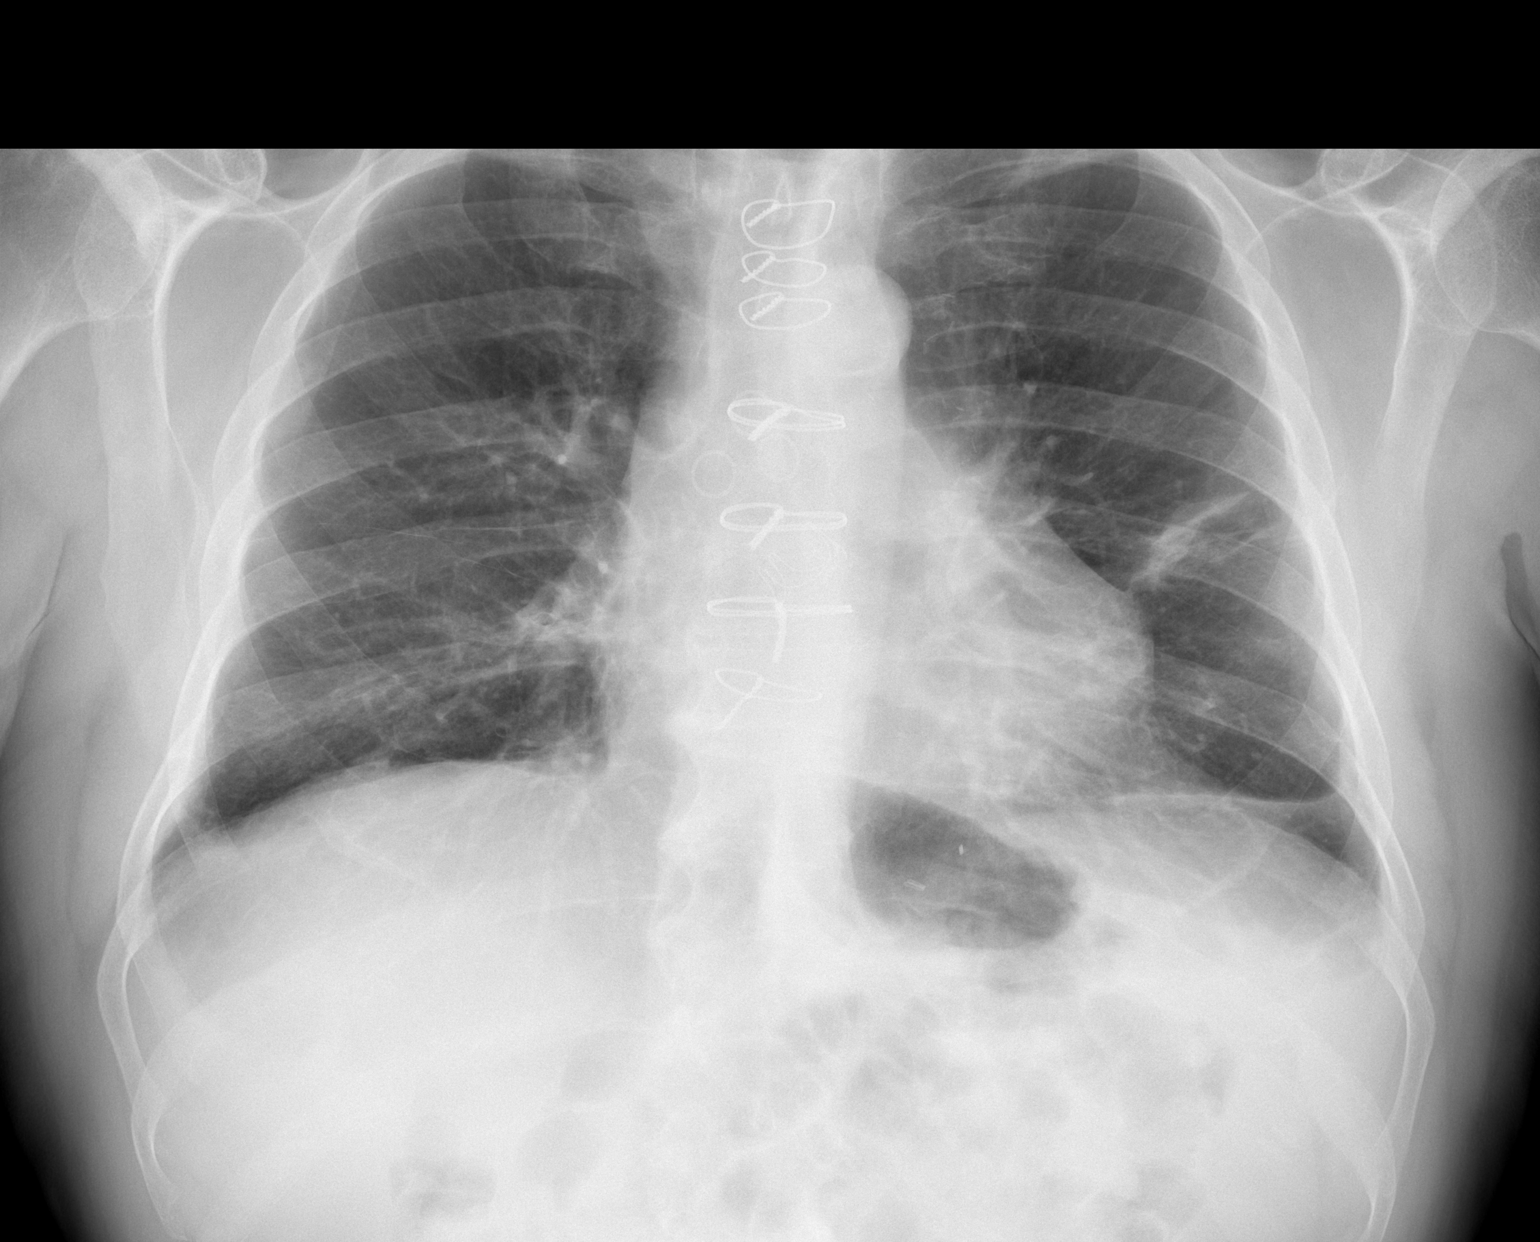

[w chest lat]
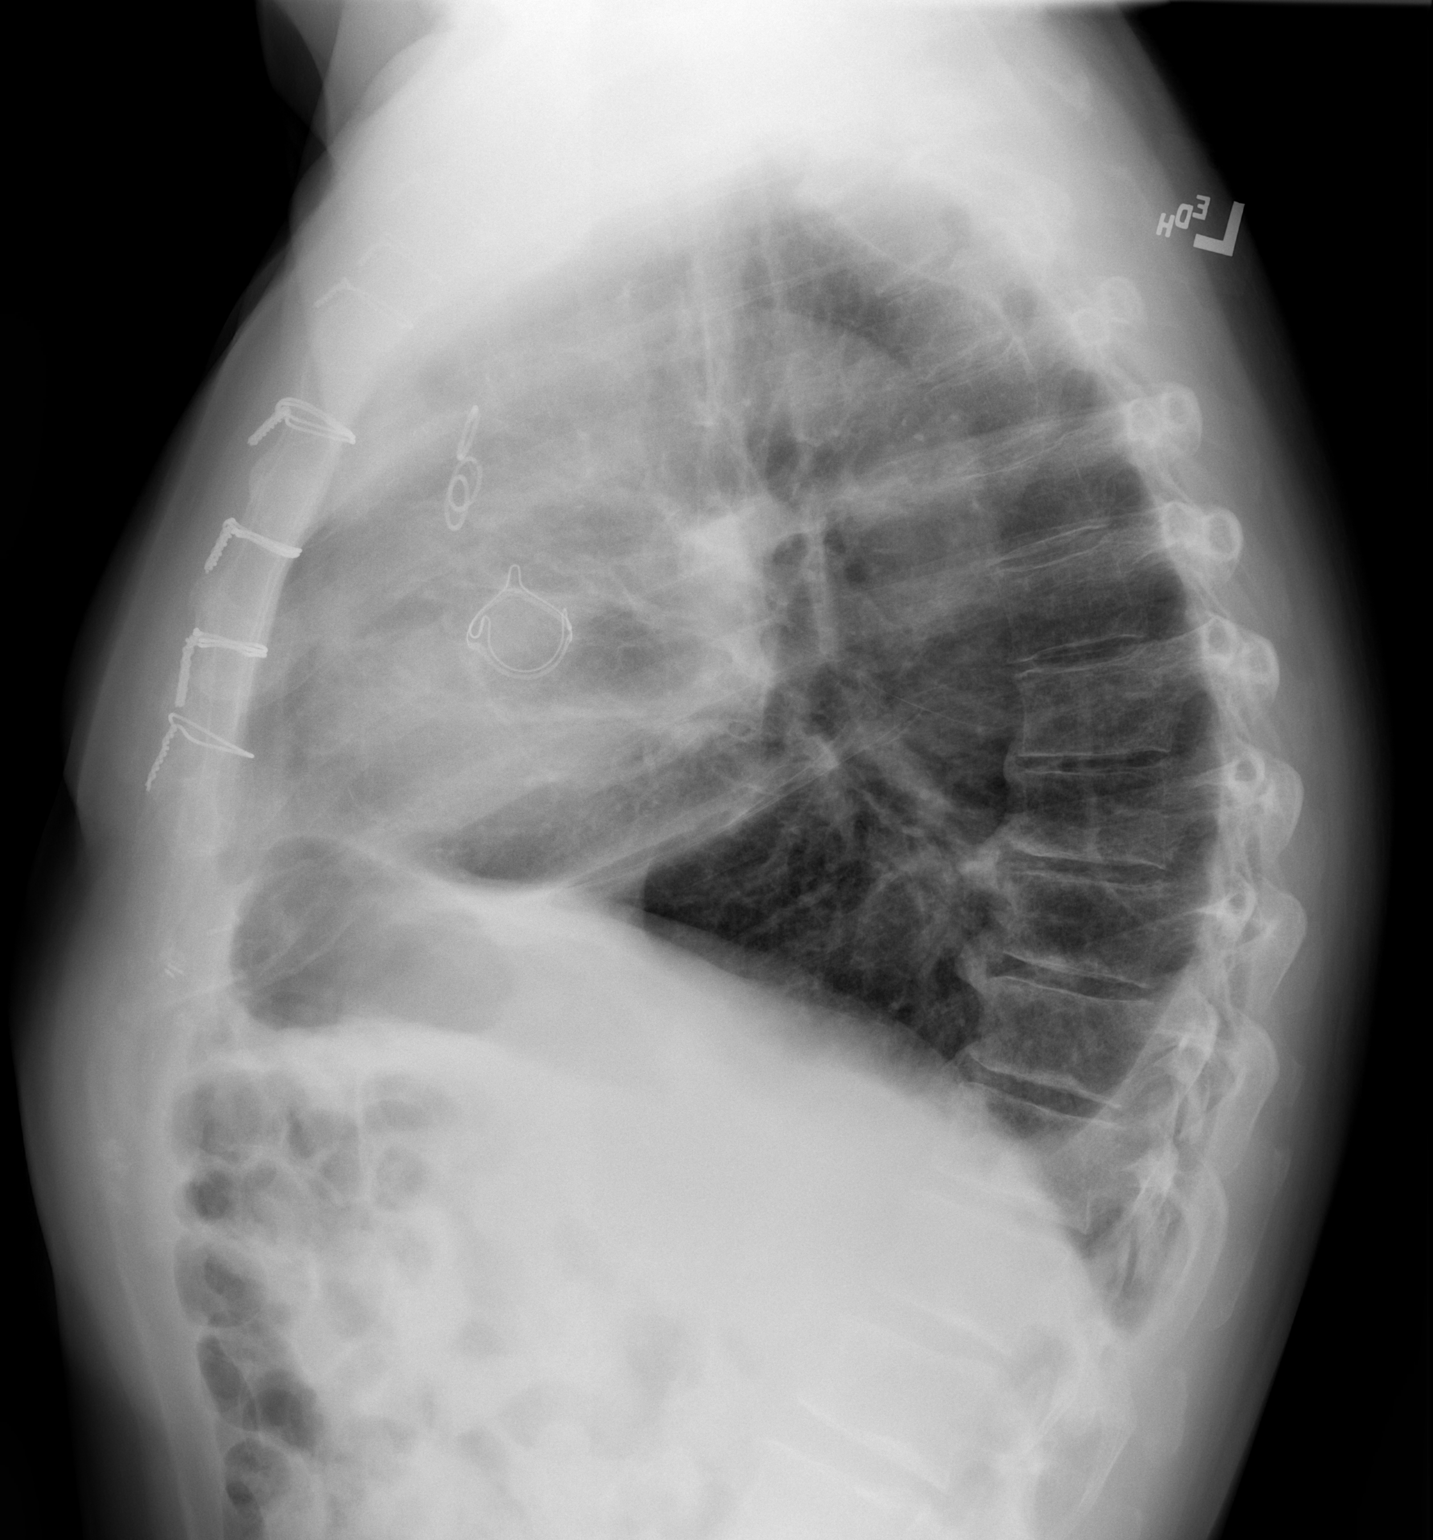

[2 of 2 positions shown; findings below may reference images not displayed]

FINDINGS: Linear atelectasis and/or scarring remains in the left mid lung and
left lung base. Only a small left pleural effusion is present.
Mediastinal and hilar contours are unremarkable. Mild cardiomegaly
is stable. Aortic valve replacement is noted. Also changes of CABG
are present.
IMPRESSION: 1. Small left pleural effusion remains.
2. Little change in linear atelectasis at the left mid lung base.

## 2019-09-03 ENCOUNTER — Ambulatory Visit (INDEPENDENT_AMBULATORY_CARE_PROVIDER_SITE_OTHER): Payer: Medicare HMO | Admitting: Osteopathic Medicine

## 2019-09-03 ENCOUNTER — Telehealth: Payer: Self-pay | Admitting: Osteopathic Medicine

## 2019-09-03 ENCOUNTER — Other Ambulatory Visit: Payer: Self-pay

## 2019-09-03 ENCOUNTER — Ambulatory Visit: Payer: Medicare HMO | Admitting: Physician Assistant

## 2019-09-03 ENCOUNTER — Encounter: Payer: Self-pay | Admitting: Osteopathic Medicine

## 2019-09-03 VITALS — BP 120/73 | HR 88 | Temp 97.9°F | Wt 192.0 lb

## 2019-09-03 DIAGNOSIS — Z8042 Family history of malignant neoplasm of prostate: Secondary | ICD-10-CM

## 2019-09-03 DIAGNOSIS — I10 Essential (primary) hypertension: Secondary | ICD-10-CM | POA: Diagnosis not present

## 2019-09-03 DIAGNOSIS — N183 Chronic kidney disease, stage 3 unspecified: Secondary | ICD-10-CM

## 2019-09-03 DIAGNOSIS — F341 Dysthymic disorder: Secondary | ICD-10-CM | POA: Diagnosis not present

## 2019-09-03 DIAGNOSIS — E785 Hyperlipidemia, unspecified: Secondary | ICD-10-CM

## 2019-09-03 DIAGNOSIS — Z951 Presence of aortocoronary bypass graft: Secondary | ICD-10-CM | POA: Diagnosis not present

## 2019-09-03 DIAGNOSIS — Z87891 Personal history of nicotine dependence: Secondary | ICD-10-CM

## 2019-09-03 MED ORDER — SERTRALINE HCL 50 MG PO TABS: 50.0000 mg | ORAL_TABLET | Freq: Every day | ORAL | 0 refills | Status: DC

## 2019-09-03 MED ORDER — ATORVASTATIN CALCIUM 80 MG PO TABS: 80.0000 mg | ORAL_TABLET | Freq: Every day | ORAL | 3 refills | Status: DC

## 2019-09-03 MED ORDER — IRBESARTAN 75 MG PO TABS: ORAL_TABLET | ORAL | 1 refills | Status: DC

## 2019-09-03 NOTE — Telephone Encounter (Signed)
Please call pharmacy regarding question for the pharmacist: Patient asks if there are alternatives to tamsulosin -he is concerned about side effect of pupil constriction.  Would like something to treat BPH that does not have this side effect, or minimally so. Pharmacist can get back to [place] on this.

## 2019-09-03 NOTE — Progress Notes (Signed)
Leonard Padilla is a 74 y.o. male who presents to  [place] at [facility]  today, 09/03/19, seeking care for the following:  The primary encounter diagnosis was Hypertension goal BP (blood pressure) < 140/90. Diagnoses of Stage 3 chronic kidney disease, unspecified whether stage 3a or 3b CKD, Dysthymia, Dyslipidemia, goal LDL below 75, Former smoker, stopped smoking in distant past, Status post four vessel coronary artery bypass, and Family history of prostate cancer in father were also pertinent to this visit.    ASSESSMENT & PLAN with other pertinent history/findings:   1. Hypertension goal BP (blood pressure) < 140/90 BP Readings from Last 3 Encounters:  09/03/19 120/73  04/29/19 (!) 118/58  03/02/19 109/74  controlled 32. Stage 3 chronic kidney disease, unspecified whether stage 3a or 3b CKD Would like to hold off on getting blood work today if possible.  Advised that we may be missing information in terms of his kidney function that might be important, patient accepts risks and is okay to repeat labs in another 6 months.  3. Dysthymia Stable on sertraline, needs refill  4. Dyslipidemia, goal LDL below 70 Refill meds  5. Former smoker, stopped smoking in distant past   6. Status post four vessel coronary artery bypass On ASA, statin, ARB, no BB  7. Family history of prostate cancer in father Last PSA ok, pt has aged out of screening   8. BPH Patient had some concerns that his eye doctor said his Flomax might be causing his pupils to constrict, this may be affecting his night vision.  Patient would like to discuss alternatives.  I will see if we can reach out to his pharmacist for any suggestions.   No orders of the defined types were placed in this encounter.   Meds ordered this encounter  Medications  . irbesartan (AVAPRO) 75 MG tablet    Sig: TAKE 1 TABLET BY MOUTH DAILY    Dispense:  90 tablet    Refill:  1  .  sertraline (ZOLOFT) 50 MG tablet    Sig: Take 1 tablet (50 mg total) by mouth daily. Needs appointment.    Dispense:  90 tablet    Refill:  0  . atorvastatin (LIPITOR) 80 MG tablet    Sig: Take 1 tablet (80 mg total) by mouth daily.    Dispense:  90 tablet    Refill:  3    \   Follow-up instructions: Return in about 6 months (around 03/02/2020) for medicare wellness w/ provider (OV40) and fasting labs .                           BP 120/73 (BP Location: Left Arm, Patient Position: Sitting, Cuff Size: Normal)   Pulse 88   Temp 97.9 F (36.6 C) (Oral)   Wt 192 lb (87.1 kg)   BMI 28.35 kg/m   Current Meds  Medication Sig  . aspirin EC 81 MG tablet Take 1 tablet (81 mg total) by mouth daily.  Marland Kitchen atorvastatin (LIPITOR) 80 MG tablet TAKE 1 TABLET(80 MG) BY MOUTH AT BEDTIME  . clobetasol ointment (TEMOVATE) 2.54 % Apply 1 application topically 2 (two) times daily as needed. Do not use for more than 2 consecutive weeks. Cycle use  . fluticasone (FLONASE) 50 MCG/ACT nasal spray Place into both nostrils daily.  . irbesartan (AVAPRO) 75 MG tablet TAKE 1 TABLET BY MOUTH DAILY  . sertraline (ZOLOFT) 50  MG tablet Take 1 tablet (50 mg total) by mouth daily. Needs appointment.  . tamsulosin (FLOMAX) 0.4 MG CAPS capsule Take 1 capsule (0.4 mg total) by mouth daily.    No results found for this or any previous visit (from the past 72 hour(s)).  No results found.  Depression screen [facility] 2/9 03/02/2019 08/27/2018 05/27/2018  Decreased Interest 0 0 2  Down, Depressed, Hopeless 0 0 1  PHQ - 2 Score 0 0 3  Altered sleeping 0 0 0  Tired, decreased energy 0 0 0  Change in appetite 0 0 0  Feeling bad or failure about yourself  0 0 0  Trouble concentrating 1 1 1   Moving slowly or fidgety/restless 0 0 0  Suicidal thoughts 0 0 0  PHQ-9 Score 1 1 4     GAD 7 : Generalized Anxiety Score 08/27/2018 05/27/2018 04/29/2017 03/18/2017  Nervous, Anxious, on Edge 0 2 0 1  Control/stop  worrying 0 0 0 0  Worry too much - different things 0 0 0 0  Trouble relaxing 0 0 0 0  Restless 0 0 0 0  Easily annoyed or irritable 0 2 1 3   Afraid - awful might happen 0 0 0 0  Total GAD 7 Score 0 4 1 4       All questions at time of visit were answered - patient instructed to contact office with any additional concerns or updates.  ER/RTC precautions were reviewed with the patient.  Please note: voice recognition software was used to produce this document, and typos may escape review. Please contact Dr. 05/01/2017 for any needed clarifications.

## 2019-09-04 MED ORDER — ALFUZOSIN HCL ER 10 MG PO TB24: 10.0000 mg | ORAL_TABLET | Freq: Every day | ORAL | 3 refills | Status: DC

## 2019-09-04 NOTE — Telephone Encounter (Signed)
At provider's request contacted [facility] pharmacy. As per the pharmacist alfuzosin (brand - Uroxatral) is good alternative rx to tamsulosin with no or minimal pupil constriction.

## 2019-09-04 NOTE — Telephone Encounter (Signed)
Thanks so much for looking into this!  Can we please call patient and let him know that the pharmacy got back to [place] with a suggested alternative and I sent that into [facility], I asked them to cancel the tamsulosin, he can stop this medication and I have taken it off his list in our system

## 2019-09-04 NOTE — Telephone Encounter (Signed)
Spoke to pt's wife regarding change in medication. Informed that pt was on his way to pick up new medication at the pharmacy. Aware to stop taking tamsulosin. Pt will directly inform pharmacy to remove previous med from their file. No other inquiries.

## 2019-09-10 ENCOUNTER — Ambulatory Visit: Payer: Medicare HMO | Attending: Internal Medicine

## 2019-09-10 DIAGNOSIS — Z23 Encounter for immunization: Secondary | ICD-10-CM

## 2019-09-10 NOTE — Progress Notes (Signed)
   Covid-19 Vaccination Clinic  Name:  DIO GILLER    MRN: [id number] DOB: 1945-09-15  09/10/2019  Mr. Balles was observed post Covid-19 immunization for 15 minutes without incidence. He was provided with Vaccine Information Sheet and instruction to access the V-Safe system.   Mr. Dennin was instructed to call 911 with any severe reactions post vaccine: Marland Kitchen Difficulty breathing  . Swelling of your face and throat  . A fast heartbeat  . A bad rash all over your body  . Dizziness and weakness    Immunizations Administered    Name Date Dose VIS Date Route   Pfizer COVID-19 Vaccine 09/10/2019 12:26 PM 0.3 mL 07/17/2019 Intramuscular   Manufacturer: [employer], [employer]   Lot: [id number]   NDC: [address]-3

## 2019-09-11 ENCOUNTER — Other Ambulatory Visit: Payer: Self-pay | Admitting: *Deleted

## 2019-09-11 DIAGNOSIS — L409 Psoriasis, unspecified: Secondary | ICD-10-CM

## 2019-09-11 MED ORDER — CLOBETASOL PROPIONATE 0.05 % EX OINT: 1.0000 "application " | TOPICAL_OINTMENT | Freq: Two times a day (BID) | CUTANEOUS | 1 refills | Status: DC | PRN

## 2019-10-16 ENCOUNTER — Ambulatory Visit (INDEPENDENT_AMBULATORY_CARE_PROVIDER_SITE_OTHER): Payer: Medicare HMO | Admitting: Physician Assistant

## 2019-10-16 ENCOUNTER — Encounter: Payer: Self-pay | Admitting: Physician Assistant

## 2019-10-16 ENCOUNTER — Other Ambulatory Visit: Payer: Self-pay

## 2019-10-16 VITALS — BP 120/66 | HR 76 | Ht 69.0 in | Wt 191.0 lb

## 2019-10-16 DIAGNOSIS — H1012 Acute atopic conjunctivitis, left eye: Secondary | ICD-10-CM

## 2019-10-16 DIAGNOSIS — J302 Other seasonal allergic rhinitis: Secondary | ICD-10-CM

## 2019-10-16 HISTORY — DX: Acute atopic conjunctivitis, left eye: H10.12

## 2019-10-16 MED ORDER — AZELASTINE HCL 0.05 % OP SOLN: 1.0000 [drp] | Freq: Two times a day (BID) | OPHTHALMIC | 2 refills | Status: DC

## 2019-10-16 MED ORDER — FLUTICASONE PROPIONATE 50 MCG/ACT NA SUSP: 2.0000 | Freq: Every day | NASAL | 11 refills | Status: DC

## 2019-10-16 MED ORDER — FEXOFENADINE-PSEUDOEPHED ER 180-240 MG PO TB24: 1.0000 | ORAL_TABLET | Freq: Every day | ORAL | 3 refills | Status: DC

## 2019-10-16 NOTE — Progress Notes (Signed)
Subjective:    Patient ID: Leonard Padilla, male    DOB: April 20, 1946, 74 y.o.   MRN: [id number]  HPI  Pt is a 74 yo male with left eye itching and discharge for 4 days. Pt has hx of allergies and needs refills sent to [facility]. He denies any fever, chills, ST, ear pain, cough, SOB,loss or smell or taste. He denies any pain or trauma to eye. He does have a little sinus congestion. Usually takes allegra during allergy seasons needs refill. No sick contacts. Left eye was swollen shut this morning. Discharge is clear. No vision changes.  .. Active Ambulatory Problems    Diagnosis Date Noted  . GERD (gastroesophageal reflux disease) 10/13/2015  . Hypertension goal BP (blood pressure) < 140/90 10/13/2015  . Psoriasis 12/02/2014  . Obstructive sleep apnea 11/29/2015  . Status post carotid endarterectomy 12/15/2015  . Status post CVA 08/03/2016  . CKD (chronic kidney disease) stage 3, GFR 30-59 ml/min (HCC) 08/04/2016  . Postural dizziness with presyncope 08/27/2016  . 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 Dysthymia 03/18/2017  . Irritability and anger 03/18/2017  . 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 Dyslipidemia, goal LDL below 70 03/18/2017  . Former smoker, stopped smoking in distant past   . Lower urinary tract symptoms (LUTS) 04/29/2017  . 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 Atherosclerosis 08/19/2017  . Diastolic dysfunction without heart failure 08/19/2017  . S/P AVR 09/09/2017  . Urinary retention due to benign prostatic hyperplasia 09/13/2017  . Bilateral leg edema 09/24/2017  . 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  . Seasonal allergic rhinitis 11/08/2017  . Colon cancer screening declined 05/27/2018  . Glaucoma suspect of both eyes 08/12/2018  . Combined form of age-related cataract, both eyes 08/12/2018  . Hypertensive retinopathy of both eyes 08/12/2018  . Posterior vitreous detachment, left eye 08/12/2018  . SBE (subacute bacterial  endocarditis) prophylaxis candidate 10/06/2018  . History of colonic polyps 03/02/2019  . Family history of prostate cancer in father 03/02/2019  . Allergic conjunctivitis of left eye 10/16/2019   Resolved Ambulatory Problems    Diagnosis Date Noted  . CVA (cerebral vascular accident) ([place]) 10/13/2015  . Elevated blood pressure reading in office with diagnosis of hypertension 03/18/2017  . Moderate aortic stenosis by prior echocardiogram 03/18/2017  . Encounter for monitoring statin therapy 04/24/2017  . Chest pain on exertion 08/12/2017  . Systolic murmur 59/16/3846  . Shortness of breath 09/24/2017  . Screening PSA (prostate specific antigen) 03/02/2019   Past Medical History:  Diagnosis Date  . Aortic stenosis   . Arthritis   . CKD stage G3a/A1, GFR 45-59 and albumin creatinine ratio <30 mg/g 08/04/2016  . Coronary artery disease   . Essential hypertension 10/13/2015  . Flu 09/2015  . Heart murmur   . History of colon polyps   . Hyperlipidemia   . Pneumonia 09/2015  . Sleep apnea   . Stroke ([place]) 09/2015  . Symptomatic carotid artery stenosis 12/15/2015  . Wears glasses      Review of Systems See HPI.     Objective:   Physical Exam Vitals reviewed.  Constitutional:      Appearance: Normal appearance.  HENT:     Head: Normocephalic.     Right Ear: Tympanic membrane normal.     Left Ear: Tympanic membrane normal.     Nose: Nose normal.     Mouth/Throat:     Mouth: Mucous membranes are moist.  Eyes:  Extraocular Movements: Extraocular movements intact.     Pupils: Pupils are equal, round, and reactive to light.     Comments: Slightly injection left more than right conjunctiva. Watery bilateral discharge. Slightly swollen left eyelid.   Cardiovascular:     Rate and Rhythm: Normal rate and regular rhythm.  Pulmonary:     Effort: Pulmonary effort is normal.     Breath sounds: Normal breath sounds.  Lymphadenopathy:     Cervical: No cervical adenopathy.   Neurological:     General: No focal deficit present.     Mental Status: He is alert and oriented to person, place, and time.  Psychiatric:        Mood and Affect: Mood normal.           Assessment & Plan:  Marland KitchenMarland KitchenSteen was seen today for eye problem.  Diagnoses and all orders for this visit:  Allergic conjunctivitis of left eye -     fexofenadine-pseudoephedrine (ALLEGRA-D ALLERGY & CONGESTION) 180-240 MG 24 hr tablet; Take 1 tablet by mouth daily. -     fluticasone (FLONASE) 50 MCG/ACT nasal spray; Place 2 sprays into both nostrils daily. -     azelastine (OPTIVAR) 0.05 % ophthalmic solution; Place 1 drop into both eyes 2 (two) times daily.  Seasonal allergic rhinitis, unspecified trigger -     fexofenadine-pseudoephedrine (ALLEGRA-D ALLERGY & CONGESTION) 180-240 MG 24 hr tablet; Take 1 tablet by mouth daily. -     fluticasone (FLONASE) 50 MCG/ACT nasal spray; Place 2 sprays into both nostrils daily.   Reassured no signs of bacterial eye infection. Watery, itchy eyes seems more consistent with allergic conjunctivitis. Offered shot of solumedrol. Did not want because he said he was told no shots within 45 days of covid vaccine. I discussed I only knew of 14 days after. Sent allergic eye drops, start flonase and allegra D. Cool compresses. HO given. If not improving or more eye pain, vision changes, thick discharge call on call doctor to consider bacterial conjunctivitis eye drops.

## 2019-10-16 NOTE — Patient Instructions (Signed)
Allergic Conjunctivitis A clear membrane (conjunctiva) covers the white part of your eye and the inner surface of your eyelid. Allergic conjunctivitis happens when this membrane has inflammation. This is caused by allergies. Common causes of allergic reactions (allergens) include:  Outdoor allergens, such as: ? Pollen. ? Grass and weeds. ? Mold spores.  Indoor allergens, such as: ? Dust. ? Smoke. ? Mold. ? Pet dander. ? Animal hair. This condition can make your eye red or pink. It can also make your eye feel itchy. This condition cannot be spread from one person to another person (is not contagious). Follow these instructions at home:  Try not to be around things that you are allergic to.  Take or apply over-the-counter and prescription medicines only as told by your doctor. These include any eye drops.  Place a cool, clean washcloth on your eye for 10-20 minutes. Do this 3-4 times a day.  Do not touch or rub your eyes.  Do not wear contact lenses until the inflammation is gone. Wear glasses instead.  Do not wear eye makeup until the inflammation is gone.  Keep all follow-up visits as told by your doctor. This is important. Contact a doctor if:  Your symptoms get worse.  Your symptoms do not get better with treatment.  You have mild eye pain.  You are sensitive to light,  You have spots or blisters on your eyes.  You have pus coming from your eye.  You have a fever. Get help right away if:  You have redness, swelling, or other symptoms in only one eye.  Your vision is blurry.  You have vision changes.  You have very bad eye pain. Summary  Allergic conjunctivitis is caused by allergies. It can make your eye red or pink, and it can make your eye feel itchy.  This condition cannot be spread from one person to another person (is not contagious).  Try not to be around things that you are allergic to.  Take or apply over-the-counter and prescription medicines  only as told by your doctor. These include any eye drops.  Contact your doctor if your symptoms get worse or they do not get better with treatment. This information is not intended to replace advice given to you by your health care provider. Make sure you discuss any questions you have with your health care provider. Document Revised: 11/11/2018 Document Reviewed: 03/16/2016 Elsevier Patient Education  2020 Elsevier Inc.  

## 2019-10-21 ENCOUNTER — Other Ambulatory Visit: Payer: Self-pay

## 2019-10-21 ENCOUNTER — Ambulatory Visit: Payer: Medicare HMO | Admitting: Cardiology

## 2019-10-21 ENCOUNTER — Encounter: Payer: Self-pay | Admitting: Cardiology

## 2019-10-21 VITALS — BP 112/70 | HR 80 | Ht 69.0 in | Wt 190.0 lb

## 2019-10-21 DIAGNOSIS — I10 Essential (primary) hypertension: Secondary | ICD-10-CM

## 2019-10-21 DIAGNOSIS — I2510 Atherosclerotic heart disease of native coronary artery without angina pectoris: Secondary | ICD-10-CM

## 2019-10-21 DIAGNOSIS — G4733 Obstructive sleep apnea (adult) (pediatric): Secondary | ICD-10-CM | POA: Diagnosis not present

## 2019-10-21 DIAGNOSIS — Z951 Presence of aortocoronary bypass graft: Secondary | ICD-10-CM | POA: Diagnosis not present

## 2019-10-21 DIAGNOSIS — Z952 Presence of prosthetic heart valve: Secondary | ICD-10-CM

## 2019-10-21 DIAGNOSIS — E785 Hyperlipidemia, unspecified: Secondary | ICD-10-CM

## 2019-10-21 HISTORY — DX: Atherosclerotic heart disease of native coronary artery without angina pectoris: I25.10

## 2019-10-21 MED ORDER — NITROGLYCERIN 0.4 MG SL SUBL: 0.4000 mg | SUBLINGUAL_TABLET | SUBLINGUAL | 6 refills | Status: DC | PRN

## 2019-10-21 NOTE — Addendum Note (Signed)
Addended by: Eleonore Chiquito on: 10/21/2019 02:56 PM   Modules accepted: Orders

## 2019-10-21 NOTE — Patient Instructions (Signed)
Medication Instructions:  Take NTG as needed for chest pain. *If you need a refill on your cardiac medications before your next appointment, please call your pharmacy*   Lab Work: None ordered If you have labs (blood work) drawn today and your tests are completely normal, you will receive your results only by: Marland Kitchen MyChart Message (if you have MyChart) OR . A paper copy in the mail If you have any lab test that is abnormal or we need to change your treatment, we will call you to review the results.   Testing/Procedures: None ordered   Follow-Up: At [facility], you and your health needs are our priority.  As part of our continuing mission to provide you with exceptional heart care, we have created designated Provider Care Teams.  These Care Teams include your primary Cardiologist (physician) and Advanced Practice Providers (APPs -  Physician Assistants and Nurse Practitioners) who all work together to provide you with the care you need, when you need it.  We recommend signing up for the patient portal called "MyChart".  Sign up information is provided on this After Visit Summary.  MyChart is used to connect with patients for Virtual Visits (Telemedicine).  Patients are able to view lab/test results, encounter notes, upcoming appointments, etc.  Non-urgent messages can be sent to your provider as well.   To learn more about what you can do with MyChart, go to [email or web address].    Your next appointment:   6 month(s)  The format for your next appointment:   In Person  Provider:   Belva Crome, MD   Other Instructions NA

## 2019-10-21 NOTE — Progress Notes (Signed)
Cardiology Office Note:    Date:  10/21/2019   ID:  Leonard Padilla, DOB 1946/04/21, MRN [id number]  PCP:  Sunnie Nielsen, DO  Cardiologist:  Garwin Brothers, MD   Referring MD: Sunnie Nielsen, DO    ASSESSMENT:    1. Coronary artery disease involving native coronary artery of native heart without angina pectoris   2. Status post four vessel coronary artery bypass   3. S/P AVR   4. Obstructive sleep apnea   5. Hypertension goal BP (blood pressure) < 140/90   6. Dyslipidemia, goal LDL below 70    PLAN:    In order of problems listed above:  1. Coronary artery disease: Secondary prevention stressed to the patient.  Importance of compliance with diet and medication stressed and he vocalized understanding.  He has a good exercise plan and he goes on the treadmill on a regular basis. 2. Essential hypertension: Blood pressure is stable.  He watches his diet and salt intake meticulously. 3. Mixed dyslipidemia: Careful with his diet and lipids were reviewed and discussed with him. 4. Post aortic valve replacement: Stable 5. Patient will be seen in follow-up appointment in 6 months or earlier if the patient has any concerns 6. Sublingual nitroglycerin prescription was sent, its protocol and 911 protocol explained and the patient vocalized understanding questions were answered to the patient's satisfaction.   Medication Adjustments/Labs and Tests Ordered: Current medicines are reviewed at length with the patient today.  Concerns regarding medicines are outlined above.  No orders of the defined types were placed in this encounter.  No orders of the defined types were placed in this encounter.    Chief Complaint  Patient presents with  . Follow-up    6 Months     History of Present Illness:    Leonard Padilla is a 74 y.o. male.  Patient has known coronary artery disease post CABG surgery and aortic valve replacement.  He denies any problems at this time and takes care of  activities of daily living.  No chest pain orthopnea or PND.  He exercises on a regular basis.  At the time of my evaluation, the patient is alert awake oriented and in no distress.  Past Medical History:  Diagnosis Date  . Aortic stenosis    moderate AS 12/01/15 echo (Dr. Elberta Fortis)  . Arthritis   . CKD (chronic kidney disease) stage 3, GFR 30-59 ml/min 08/04/2016  . CKD stage G3a/A1, GFR 45-59 and albumin creatinine ratio <30 mg/g 08/04/2016  . Combined form of age-related cataract, both eyes 08/12/2018  . Coronary artery disease   . Dysthymia 03/18/2017  . 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 Essential hypertension 10/13/2015  . Flu 09/2015   influenza A  . Former smoker, stopped smoking in distant past   . GERD (gastroesophageal reflux disease)    occ  . Glaucoma suspect of both eyes 08/12/2018  . Heart murmur   . History of colon polyps   . Hyperlipidemia   . Hypertensive retinopathy of both eyes 08/12/2018  . 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 Pneumonia 09/2015  . Posterior vitreous detachment, left eye 08/12/2018  . Postural dizziness with presyncope 08/27/2016  . Psoriasis   . Sleep apnea    to pick cpap tomorrow 12/13/15  . Stroke (HCC) 09/2015  . Symptomatic carotid artery stenosis 12/15/2015  . 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 Wears glasses     Past Surgical History:  Procedure Laterality Date  . AORTIC VALVE REPLACEMENT N/A 09/09/2017   Procedure: AORTIC VALVE REPLACEMENT (AVR) using a 23 Edwards Perimount Magna Ease Aortic Valve;  Surgeon: Delight Ovens, MD;  Location: [facility] OR;  Service: Open Heart Surgery;  Laterality: N/A;  . COLONOSCOPY W/ BIOPSIES AND POLYPECTOMY    . CORONARY ARTERY BYPASS GRAFT N/A 09/09/2017   Procedure: CORONARY ARTERY BYPASS GRAFTING (CABG) x 4 using the left internal mammary artery and right greater saphenous vein harvested endoscopically.;  Surgeon: Delight Ovens, MD;  Location: [facility] OR;   Service: Open Heart Surgery;  Laterality: N/A;  . ENDARTERECTOMY Right 12/15/2015   Procedure: RIGHT CAROTID ENDARTERECTOMY WITH PATCH ANGIOPLASTY;  Surgeon: Nada Libman, MD;  Location: [facility] OR;  Service: Vascular;  Laterality: Right;  . PATCH ANGIOPLASTY Right 12/15/2015   Procedure: RIGHT CAROTID PATCH ANGIOPLASTY;  Surgeon: Nada Libman, MD;  Location: [facility] OR;  Service: Vascular;  Laterality: Right;  . RIGHT/LEFT HEART CATH AND CORONARY ANGIOGRAPHY N/A 08/23/2017   Procedure: RIGHT/LEFT HEART CATH AND CORONARY ANGIOGRAPHY;  Surgeon: Swaziland, Peter M, MD;  Location: [facility] INVASIVE CV LAB;  Service: Cardiovascular;  Laterality: N/A;  . TEE WITHOUT CARDIOVERSION N/A 09/09/2017   Procedure: TRANSESOPHAGEAL ECHOCARDIOGRAM (TEE);  Surgeon: Delight Ovens, MD;  Location: [facility] OR;  Service: Open Heart Surgery;  Laterality: N/A;  . WISDOM TOOTH EXTRACTION      Current Medications: Current Meds  Medication Sig  . alfuzosin (UROXATRAL) 10 MG 24 hr tablet Take 1 tablet (10 mg total) by mouth daily with breakfast.  . aspirin EC 81 MG tablet Take 1 tablet (81 mg total) by mouth daily.  Marland Kitchen atorvastatin (LIPITOR) 80 MG tablet Take 1 tablet (80 mg total) by mouth daily.  Marland Kitchen azelastine (OPTIVAR) 0.05 % ophthalmic solution Place 1 drop into both eyes 2 (two) times daily.  . clobetasol ointment (TEMOVATE) 0.05 % Apply 1 application topically 2 (two) times daily as needed. Do not use for more than 2 consecutive weeks. Cycle use  . fexofenadine-pseudoephedrine (ALLEGRA-D ALLERGY & CONGESTION) 180-240 MG 24 hr tablet Take 1 tablet by mouth daily.  . fluticasone (FLONASE) 50 MCG/ACT nasal spray Place 2 sprays into both nostrils daily.  . irbesartan (AVAPRO) 75 MG tablet TAKE 1 TABLET BY MOUTH DAILY  . sertraline (ZOLOFT) 50 MG tablet Take 1 tablet (50 mg total) by mouth daily. Needs appointment.     Allergies:   Viagra [sildenafil citrate] and Metoprolol   Social History   Socioeconomic History  . Marital  status: Married    Spouse name: [name]  . Number of children: 2  . Years of education: 6  . Highest education level: Not on file  Occupational History  . Occupation: Retired    Comment: retired Clinical biochemist, [employer]  Tobacco Use  . Smoking status: Former Smoker    Quit date: 08/07/1983    Years since quitting: 36.2  . Smokeless tobacco: Never Used  Substance and Sexual Activity  . Alcohol use: Yes    Comment: 1 glass of wine daily   . Drug use: No  . Sexual activity: Not Currently  Other Topics Concern  . Not on file  Social History Narrative   Lives with wife   Caffeine use- coffee 3-4 cups daily   Social Determinants of Health   Financial Resource Strain:   . Difficulty of Paying Living Expenses:   Food Insecurity:   . Worried About Programme researcher, broadcasting/film/video in the Last Year:   .  Ran Out of Food in the Last Year:   Transportation Needs:   . Film/video editor (Medical):   Marland Kitchen Lack of Transportation (Non-Medical):   Physical Activity:   . Days of Exercise per Week:   . Minutes of Exercise per Session:   Stress:   . Feeling of Stress :   Social Connections:   . Frequency of Communication with Friends and Family:   . Frequency of Social Gatherings with Friends and Family:   . Attends Religious Services:   . Active Member of Clubs or Organizations:   . Attends Archivist Meetings:   Marland Kitchen Marital Status:      Family History: The patient's family history includes Cancer in his father and mother.  ROS:   Please see the history of present illness.    All other systems reviewed and are negative.  EKGs/Labs/Other Studies Reviewed:    The following studies were reviewed today: I discussed my findings with the patient at length   Recent Labs: 03/02/2019: Hemoglobin 13.7; Platelets 204 03/10/2019: BUN 27; Creat 1.42; Potassium 4.5; Sodium 139 04/30/2019: ALT 17  Recent Lipid Panel    Component Value Date/Time   CHOL 135 04/30/2019 1009   TRIG 99 04/30/2019 1009     HDL 55 04/30/2019 1009   CHOLHDL 2.5 04/30/2019 1009   CHOLHDL 2.9 11/08/2017 1334   VLDL 23 08/03/2016 1115   LDLCALC 62 04/30/2019 1009   LDLCALC 73 11/08/2017 1334    Physical Exam:    VS:  BP 112/70   Pulse 80   Ht 5\' 9"  (1.753 m)   Wt 190 lb (86.2 kg)   SpO2 97%   BMI 28.06 kg/m     Wt Readings from Last 3 Encounters:  10/21/19 190 lb (86.2 kg)  10/16/19 191 lb (86.6 kg)  09/03/19 192 lb (87.1 kg)     GEN: Patient is in no acute distress HEENT: Normal NECK: No JVD; No carotid bruits LYMPHATICS: No lymphadenopathy CARDIAC: Hear sounds regular, 2/6 systolic murmur at the apex. RESPIRATORY:  Clear to auscultation without rales, wheezing or rhonchi  ABDOMEN: Soft, non-tender, non-distended MUSCULOSKELETAL:  No edema; No deformity  SKIN: Warm and dry NEUROLOGIC:  Alert and oriented x 3 PSYCHIATRIC:  Normal affect   Signed, Jenean Lindau, MD  10/21/2019 2:21 PM    [facility]

## 2019-12-01 ENCOUNTER — Other Ambulatory Visit: Payer: Self-pay

## 2019-12-01 DIAGNOSIS — F341 Dysthymic disorder: Secondary | ICD-10-CM

## 2019-12-01 MED ORDER — SERTRALINE HCL 50 MG PO TABS: 50.0000 mg | ORAL_TABLET | Freq: Every day | ORAL | 0 refills | Status: DC

## 2020-02-12 ENCOUNTER — Emergency Department
Admission: EM | Admit: 2020-02-12 | Discharge: 2020-02-12 | Disposition: A | Discharge status: Home / Self Care | Payer: Medicare HMO | Source: Home / Self Care | Attending: Family Medicine | Admitting: Family Medicine

## 2020-02-12 ENCOUNTER — Other Ambulatory Visit: Payer: Self-pay

## 2020-02-12 ENCOUNTER — Telehealth: Payer: Self-pay

## 2020-02-12 DIAGNOSIS — Z8679 Personal history of other diseases of the circulatory system: Secondary | ICD-10-CM

## 2020-02-12 DIAGNOSIS — R079 Chest pain, unspecified: Secondary | ICD-10-CM | POA: Diagnosis not present

## 2020-02-12 DIAGNOSIS — R1013 Epigastric pain: Secondary | ICD-10-CM

## 2020-02-12 LAB — TROPONIN I: Troponin I: 8 ng/L (ref <=47)

## 2020-02-12 MED ORDER — OMEPRAZOLE 40 MG PO CPDR: DELAYED_RELEASE_CAPSULE | ORAL | 1 refills | Status: DC

## 2020-02-12 NOTE — ED Triage Notes (Signed)
Patient presents to Urgent Care with complaints of midsternal chest pain since last night, lasting approx 5-6 hours. Patient reports he took three nitroglycerin but the pain did not improve. Pt eventually got up and slept in his recliner and was able to get some sleep, pt does have a cardiologist but has not tried to get in touch with them.

## 2020-02-12 NOTE — ED Provider Notes (Signed)
[name]    CSN: [id number] Arrival date & time: 02/12/20  0948      History   Chief Complaint Chief Complaint  Patient presents with  . Chest Pain    last night    HPI Leonard Padilla is a 74 y.o. male.   Last night at bedtime patient developed sharp non-radiating sub-xiphoid and upper abdominal pain.  He had nausea without vomiting and an urge to have a bowel movement (but did not).  He took SL NTG three times without improvement.  The pain lasted about 5 hours until he finally sat in a reclining chair with gradual resolution of the pain.  He is assymptomatic at present. He has a history of coronary artery disease, having undergone 4-vessel CABG three years ago.  He also has a history of GERD, although past symptoms have not been similar to his present symptoms.     The history is provided by the patient.  Chest Pain Pain location:  Epigastric Pain quality: sharp   Pain radiates to:  Does not radiate Pain severity:  Moderate Onset quality:  Sudden Duration:  5 hours Timing:  Constant Progression:  Resolved Chronicity:  New Context: at rest   Context: not breathing, not eating, not lifting, not movement, not stress and not trauma   Relieved by:  Certain positions Worsened by:  Nothing Ineffective treatments:  Nitroglycerin Associated symptoms: abdominal pain and nausea   Associated symptoms: no AICD problem, no altered mental status, no anorexia, no anxiety, no back pain, no claudication, no cough, no diaphoresis, no dizziness, no dysphagia, no fatigue, no fever, no headache, no heartburn, no lower extremity edema, no near-syncope, no palpitations, no PND, no shortness of breath, no syncope, no vomiting and no weakness   Risk factors: coronary artery disease and high cholesterol     Past Medical History:  Diagnosis Date  . Aortic stenosis    moderate AS 12/01/15 echo (Dr. Elberta Fortis)  . Arthritis   . CKD (chronic kidney disease) stage 3, GFR 30-59 ml/min  08/04/2016  . CKD stage G3a/A1, GFR 45-59 and albumin creatinine ratio <30 mg/g 08/04/2016  . Combined form of age-related cataract, both eyes 08/12/2018  . Coronary artery disease   . Dysthymia 03/18/2017  . 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 Essential hypertension 10/13/2015  . Flu 09/2015   influenza A  . Former smoker, stopped smoking in distant past   . GERD (gastroesophageal reflux disease)    occ  . Glaucoma suspect of both eyes 08/12/2018  . Heart murmur   . History of colon polyps   . Hyperlipidemia   . Hypertensive retinopathy of both eyes 08/12/2018  . 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 Pneumonia 09/2015  . Posterior vitreous detachment, left eye 08/12/2018  . Postural dizziness with presyncope 08/27/2016  . Psoriasis   . Sleep apnea    to pick cpap tomorrow 12/13/15  . Stroke (HCC) 09/2015  . Symptomatic carotid artery stenosis 12/15/2015  . 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 Wears glasses     Patient Active Problem List   Diagnosis Date Noted  . CAD (coronary artery disease) 10/21/2019  . Allergic conjunctivitis of left eye 10/16/2019  . History of colonic polyps 03/02/2019  . Family history of prostate cancer in father 03/02/2019  . SBE (subacute bacterial endocarditis) prophylaxis candidate 10/06/2018  . Glaucoma suspect of both eyes 08/12/2018  . Combined form of age-related cataract, both eyes  08/12/2018  . Hypertensive retinopathy of both eyes 08/12/2018  . Posterior vitreous detachment, left eye 08/12/2018  . Colon cancer screening declined 05/27/2018  . 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  . Seasonal allergic rhinitis 11/08/2017  . Bilateral leg edema 09/24/2017  . Urinary retention due to benign prostatic hyperplasia 09/13/2017  . S/P AVR 09/09/2017  . Atherosclerosis 08/19/2017  . Diastolic dysfunction without heart failure 08/19/2017  . 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  Lower urinary tract symptoms (LUTS) 04/29/2017  . Former smoker, stopped smoking in distant past   . Dysthymia 03/18/2017  . Irritability and anger 03/18/2017  . 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 Dyslipidemia, goal LDL below 70 03/18/2017  . 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 Postural dizziness with presyncope 08/27/2016  . CKD (chronic kidney disease) stage 3, GFR 30-59 ml/min (HCC) 08/04/2016  . Status post CVA 08/03/2016  . Status post carotid endarterectomy 12/15/2015  . Obstructive sleep apnea 11/29/2015  . GERD (gastroesophageal reflux disease) 10/13/2015  . Hypertension goal BP (blood pressure) < 140/90 10/13/2015  . Psoriasis 12/02/2014    Past Surgical History:  Procedure Laterality Date  . AORTIC VALVE REPLACEMENT N/A 09/09/2017   Procedure: AORTIC VALVE REPLACEMENT (AVR) using a 23 Edwards Perimount Magna Ease Aortic Valve;  Surgeon: Delight OvensGerhardt, Edward B, MD;  Location: [facility] OR;  Service: Open Heart Surgery;  Laterality: N/A;  . COLONOSCOPY W/ BIOPSIES AND POLYPECTOMY    . CORONARY ARTERY BYPASS GRAFT N/A 09/09/2017   Procedure: CORONARY ARTERY BYPASS GRAFTING (CABG) x 4 using the left internal mammary artery and right greater saphenous vein harvested endoscopically.;  Surgeon: Delight OvensGerhardt, Edward B, MD;  [facility];  Service: Open Heart Surgery;  Laterality: N/A;  . ENDARTERECTOMY Right 12/15/2015   Procedure: RIGHT CAROTID ENDARTERECTOMY WITH PATCH ANGIOPLASTY;  Surgeon: Nada LibmanVance W Brabham, MD;  Location: [facility] OR;  Service: Vascular;  Laterality: Right;  . PATCH ANGIOPLASTY Right 12/15/2015   Procedure: RIGHT CAROTID PATCH ANGIOPLASTY;  Surgeon: Nada LibmanVance W Brabham, MD;  [facility];  Service: Vascular;  Laterality: Right;  . RIGHT/LEFT HEART CATH AND CORONARY ANGIOGRAPHY N/A 08/23/2017   Procedure: RIGHT/LEFT HEART CATH AND CORONARY ANGIOGRAPHY;  Surgeon: SwazilandJordan, Peter M, MD;  [facility];  Service: Cardiovascular;  Laterality: N/A;  . TEE WITHOUT  CARDIOVERSION N/A 09/09/2017   Procedure: TRANSESOPHAGEAL ECHOCARDIOGRAM (TEE);  Surgeon: Delight OvensGerhardt, Edward B, MD;  [facility];  Service: Open Heart Surgery;  Laterality: N/A;  . WISDOM TOOTH EXTRACTION         Home Medications    Prior to Admission medications   Medication Sig Start Date End Date Taking? Authorizing Provider  nitroGLYCERIN (NITROSTAT) 0.4 MG SL tablet Place 1 tablet (0.4 mg total) under the tongue every 5 (five) minutes as needed for chest pain. 10/21/19 02/12/20 Yes Revankar, Aundra Dubinajan R, MD  alfuzosin (UROXATRAL) 10 MG 24 hr tablet Take 1 tablet (10 mg total) by mouth daily with breakfast. 09/04/19   Sunnie NielsenAlexander, Natalie, DO  aspirin EC 81 MG tablet Take 1 tablet (81 mg total) by mouth daily. 08/22/17   Revankar, Aundra Dubinajan R, MD  atorvastatin (LIPITOR) 80 MG tablet Take 1 tablet (80 mg total) by mouth daily. 09/03/19   Sunnie NielsenAlexander, Natalie, DO  azelastine (OPTIVAR) 0.05 % ophthalmic solution Place 1 drop into both eyes 2 (two) times daily. 10/16/19   Breeback, Jade L, PA-C  clobetasol ointment (TEMOVATE) 0.05 % Apply 1 application topically 2 (two) times daily as needed. Do  not use for more than 2 consecutive weeks. Cycle use 09/11/19   Sunnie Nielsen, DO  fexofenadine-pseudoephedrine (ALLEGRA-D ALLERGY & CONGESTION) 180-240 MG 24 hr tablet Take 1 tablet by mouth daily. 10/16/19   Breeback, Jade L, PA-C  fluticasone (FLONASE) 50 MCG/ACT nasal spray Place 2 sprays into both nostrils daily. 10/16/19   Breeback, Lonna Cobb, PA-C  irbesartan (AVAPRO) 75 MG tablet TAKE 1 TABLET BY MOUTH DAILY 09/03/19   Sunnie Nielsen, DO  omeprazole (PRILOSEC) 40 MG capsule Take one cap PO once daily 30 minutes AC 02/12/20   Lattie Haw, MD  sertraline (ZOLOFT) 50 MG tablet Take 1 tablet (50 mg total) by mouth daily. Needs appointment. 12/01/19   Sunnie Nielsen, DO    Family History Family History  Problem Relation Age of Onset  . Cancer Mother        ovarian cancer  . Cancer Father        Prostate     Social History Social History   Tobacco Use  . Smoking status: Former Smoker    Quit date: 08/07/1983    Years since quitting: 36.5  . Smokeless tobacco: Never Used  Vaping Use  . Vaping Use: Never used  Substance Use Topics  . Alcohol use: Yes    Comment: 1 glass of wine daily   . Drug use: No     Allergies   Viagra [sildenafil citrate] and Metoprolol   Review of Systems Review of Systems  Constitutional: Negative for diaphoresis, fatigue and fever.  HENT: Negative.  Negative for trouble swallowing.   Eyes: Negative.   Respiratory: Negative for cough, choking, chest tightness, shortness of breath, wheezing and stridor.   Cardiovascular: Positive for chest pain. Negative for palpitations, claudication, leg swelling, syncope, PND and near-syncope.  Gastrointestinal: Positive for abdominal pain and nausea. Negative for abdominal distention, anorexia, heartburn and vomiting.  Genitourinary: Negative.   Musculoskeletal: Negative for back pain.  Skin: Negative.   Neurological: Negative for dizziness, weakness and headaches.  All other systems reviewed and are negative.    Physical Exam Triage Vital Signs ED Triage Vitals  Enc Vitals Group     BP 02/12/20 1005 136/84     Pulse Rate 02/12/20 1005 80     Resp 02/12/20 1005 16     Temp 02/12/20 1005 98 F (36.7 C)     Temp Source 02/12/20 1005 Oral     SpO2 02/12/20 1005 99 %     Weight --      Height --      Head Circumference --      Peak Flow --      Pain Score 02/12/20 1003 0     Pain Loc --      Pain Edu? --      Excl. in GC? --    No data found.  Updated Vital Signs BP 136/84 (BP Location: Left Arm)   Pulse 80   Temp 98 F (36.7 C) (Oral)   Resp 16   SpO2 99%   Visual Acuity Right Eye Distance:   Left Eye Distance:   Bilateral Distance:    Right Eye Near:   Left Eye Near:    Bilateral Near:     Physical Exam Vitals and nursing note reviewed.  Constitutional:      General: He is not in  acute distress.    Appearance: He is not ill-appearing or diaphoretic.  HENT:     Head: Normocephalic.     Mouth/Throat:  Pharynx: Oropharynx is clear.  Eyes:     Conjunctiva/sclera: Conjunctivae normal.     Pupils: Pupils are equal, round, and reactive to light.  Cardiovascular:     Rate and Rhythm: Normal rate and regular rhythm.     Heart sounds: Normal heart sounds.  Pulmonary:     Breath sounds: Normal breath sounds.  Abdominal:     General: Abdomen is flat. Bowel sounds are normal.     Palpations: Abdomen is soft. There is no hepatomegaly or splenomegaly.     Tenderness: There is no right CVA tenderness or left CVA tenderness.     Hernia: There is no hernia in the umbilical area or ventral area.       Comments: There is mild tenderness to palpation sub-xiphoid area as noted on diagram.   Musculoskeletal:        General: No swelling or tenderness.     Cervical back: Neck supple.     Right lower leg: No edema.     Left lower leg: No edema.  Lymphadenopathy:     Cervical: No cervical adenopathy.  Skin:    General: Skin is warm and dry.  Neurological:     Mental Status: He is alert and oriented to person, place, and time.      UC Treatments / Results  Labs (all labs ordered are listed, but only abnormal results are displayed) Labs Reviewed  TROPONIN I  TROPONIN I (HIGH SENSITIVITY)    EKG  Rate:  84 BPM PR:  148 msec QT:  402 msec QTcH:  475 msec QRSD:  84 msec QRS axis:  60 degrees Interpretation:  No acute changes; normal sinus rhythm   Radiology No results found.  Procedures Procedures (including critical care time)  Medications Ordered in UC Medications - No data to display  Initial Impression / Assessment and Plan / UC Course  I have reviewed the triage vital signs and the nursing notes.  Pertinent labs & imaging results that were available during my care of the patient were reviewed by me and considered in my medical decision making (see  chart for details).    EKG shows no acute changes, and comparison with previous tracings done 10/21/19 and 09/24/17 show no significant change.  Because of EKG automated assessment "Possible lateral infarct, age undetermined," and " Possible inferior infarct, age undetermined," will check stat Troponin. Suspect GERD.  Begin trial of omeprazole. Recommend follow-up with PCP in about a week.     Final Clinical Impressions(s) / UC Diagnoses   Final diagnoses:  Epigastric abdominal pain  History of ASCVD (arteriosclerotic cardiovascular disease)     Discharge Instructions     Avoid eating before bedtime.  If symptoms become significantly worse during the night or over the weekend, proceed to the local emergency room.     ED Prescriptions    Medication Sig Dispense Auth. Provider   omeprazole (PRILOSEC) 40 MG capsule Take one cap PO once daily 30 minutes AC 15 capsule Lattie Haw, MD     Addendum: Troponin I:   8ng/L   Lattie Haw, MD 02/13/20 2158

## 2020-02-12 NOTE — Discharge Instructions (Addendum)
Avoid eating before bedtime.  If symptoms become significantly worse during the night or over the weekend, proceed to the local emergency room.

## 2020-02-12 NOTE — Telephone Encounter (Signed)
Pt's wife called stating that pt was feeling unwell since yesterday. Pt was having complaints of chest pains, lightheadedness and dizziness. Wife mentioned that he took nitroglycerin but it did not relieve his symptoms. Pt was unable to sleep until late this morning. Pt has had a quadruple bypass surgery in the past. Wife was informed no available appointments today. Advised to take her husband to UC/ER immediately for an evaluation. Pt's wife was going to contact the cardiologist as well. Wife was agreeable with plan.

## 2020-02-26 ENCOUNTER — Other Ambulatory Visit: Payer: Self-pay | Admitting: Osteopathic Medicine

## 2020-02-26 DIAGNOSIS — F341 Dysthymic disorder: Secondary | ICD-10-CM

## 2020-02-29 DIAGNOSIS — F341 Dysthymic disorder: Secondary | ICD-10-CM

## 2020-02-29 NOTE — Telephone Encounter (Signed)
Medication refill Last Refill- 12/01/2019 Last Ov- 09/03/2019

## 2020-03-03 ENCOUNTER — Encounter: Payer: Self-pay | Admitting: Osteopathic Medicine

## 2020-03-03 ENCOUNTER — Ambulatory Visit (INDEPENDENT_AMBULATORY_CARE_PROVIDER_SITE_OTHER): Payer: Medicare HMO | Admitting: Osteopathic Medicine

## 2020-03-03 VITALS — BP 103/70 | HR 89 | Temp 97.0°F | Ht 68.5 in | Wt 183.0 lb

## 2020-03-03 DIAGNOSIS — I7090 Unspecified atherosclerosis: Secondary | ICD-10-CM

## 2020-03-03 DIAGNOSIS — Z86010 Personal history of colonic polyps: Secondary | ICD-10-CM

## 2020-03-03 DIAGNOSIS — Z951 Presence of aortocoronary bypass graft: Secondary | ICD-10-CM

## 2020-03-03 DIAGNOSIS — Z0000 Encounter for general adult medical examination without abnormal findings: Secondary | ICD-10-CM

## 2020-03-03 DIAGNOSIS — Z5320 Procedure and treatment not carried out because of patient's decision for unspecified reasons: Secondary | ICD-10-CM

## 2020-03-03 DIAGNOSIS — Z8673 Personal history of transient ischemic attack (TIA), and cerebral infarction without residual deficits: Secondary | ICD-10-CM

## 2020-03-03 DIAGNOSIS — Z8042 Family history of malignant neoplasm of prostate: Secondary | ICD-10-CM

## 2020-03-03 DIAGNOSIS — N183 Chronic kidney disease, stage 3 unspecified: Secondary | ICD-10-CM

## 2020-03-03 DIAGNOSIS — I2510 Atherosclerotic heart disease of native coronary artery without angina pectoris: Secondary | ICD-10-CM

## 2020-03-03 DIAGNOSIS — E785 Hyperlipidemia, unspecified: Secondary | ICD-10-CM | POA: Diagnosis not present

## 2020-03-03 DIAGNOSIS — R399 Unspecified symptoms and signs involving the genitourinary system: Secondary | ICD-10-CM

## 2020-03-03 NOTE — Progress Notes (Signed)
HPI: Leonard Padilla is a 74 y.o. male  who presents to [facility] today, 03/03/20,  for Medicare Annual Wellness Exam  Patient presents for annual physical/Medicare wellness exam. No other complaints today. Requests PSA testing - (+)FH Prostate CA    Past medical, surgical, social and family history reviewed:  Patient Active Problem List   Diagnosis Date Noted  . CAD (coronary artery disease) 10/21/2019  . Allergic conjunctivitis of left eye 10/16/2019  . History of colonic polyps 03/02/2019  . Family history of prostate cancer in father 03/02/2019  . SBE (subacute bacterial endocarditis) prophylaxis candidate 10/06/2018  . Glaucoma suspect of both eyes 08/12/2018  . Combined form of age-related cataract, both eyes 08/12/2018  . Hypertensive retinopathy of both eyes 08/12/2018  . Posterior vitreous detachment, left eye 08/12/2018  . Colon cancer screening declined 05/27/2018  . 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  . Seasonal allergic rhinitis 11/08/2017  . Bilateral leg edema 09/24/2017  . Urinary retention due to benign prostatic hyperplasia 09/13/2017  . S/P AVR 09/09/2017  . Atherosclerosis 08/19/2017  . Diastolic dysfunction without heart failure 08/19/2017  . 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 Lower urinary tract symptoms (LUTS) 04/29/2017  . Former smoker, stopped smoking in distant past   . Dysthymia 03/18/2017  . Irritability and anger 03/18/2017  . 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 Dyslipidemia, goal LDL below 70 03/18/2017  . 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 Postural dizziness with presyncope 08/27/2016  . CKD (chronic kidney disease) stage 3, GFR 30-59 ml/min (HCC) 08/04/2016  . Status post CVA 08/03/2016  . Status post carotid endarterectomy 12/15/2015  . Obstructive sleep apnea 11/29/2015  . GERD (gastroesophageal reflux disease) 10/13/2015  . Hypertension goal BP (blood  pressure) < 140/90 10/13/2015  . Psoriasis 12/02/2014    Past Surgical History:  Procedure Laterality Date  . AORTIC VALVE REPLACEMENT N/A 09/09/2017   Procedure: AORTIC VALVE REPLACEMENT (AVR) using a 23 Edwards Perimount Magna Ease Aortic Valve;  Surgeon: Delight OvensGerhardt, Edward B, MD;  Location: [facility] OR;  Service: Open Heart Surgery;  Laterality: N/A;  . COLONOSCOPY W/ BIOPSIES AND POLYPECTOMY    . CORONARY ARTERY BYPASS GRAFT N/A 09/09/2017   Procedure: CORONARY ARTERY BYPASS GRAFTING (CABG) x 4 using the left internal mammary artery and right greater saphenous vein harvested endoscopically.;  Surgeon: Delight OvensGerhardt, Edward B, MD;  [facility];  Service: Open Heart Surgery;  Laterality: N/A;  . ENDARTERECTOMY Right 12/15/2015   Procedure: RIGHT CAROTID ENDARTERECTOMY WITH PATCH ANGIOPLASTY;  Surgeon: Nada LibmanVance W Brabham, MD;  Location: [facility] OR;  Service: Vascular;  Laterality: Right;  . PATCH ANGIOPLASTY Right 12/15/2015   Procedure: RIGHT CAROTID PATCH ANGIOPLASTY;  Surgeon: Nada LibmanVance W Brabham, MD;  [facility];  Service: Vascular;  Laterality: Right;  . RIGHT/LEFT HEART CATH AND CORONARY ANGIOGRAPHY N/A 08/23/2017   Procedure: RIGHT/LEFT HEART CATH AND CORONARY ANGIOGRAPHY;  Surgeon: SwazilandJordan, Peter M, MD;  [facility];  Service: Cardiovascular;  Laterality: N/A;  . TEE WITHOUT CARDIOVERSION N/A 09/09/2017   Procedure: TRANSESOPHAGEAL ECHOCARDIOGRAM (TEE);  Surgeon: Delight OvensGerhardt, Edward B, MD;  [facility];  Service: Open Heart Surgery;  Laterality: N/A;  . WISDOM TOOTH EXTRACTION      Social History   Socioeconomic History  . Marital status: Married    Spouse name: [name]  . Number of children: 2  . Years of education: 3913  . Highest education level: Not on file  Occupational History  .  Occupation: Retired    Comment: retired Clinical biochemist, [employer]  Tobacco Use  . Smoking status: Former Smoker    Quit date: 08/07/1983    Years since quitting: 36.5  . Smokeless tobacco: Never Used   Vaping Use  . Vaping Use: Never used  Substance and Sexual Activity  . Alcohol use: Yes    Comment: 1 glass of wine daily   . Drug use: No  . Sexual activity: Not Currently  Other Topics Concern  . Not on file  Social History Narrative   Lives with wife   Caffeine use- coffee 3-4 cups daily   Social Determinants of Health   Financial Resource Strain:   . Difficulty of Paying Living Expenses:   Food Insecurity:   . Worried About Programme researcher, broadcasting/film/video in the Last Year:   . Barista in the Last Year:   Transportation Needs:   . Freight forwarder (Medical):   Marland Kitchen Lack of Transportation (Non-Medical):   Physical Activity:   . Days of Exercise per Week:   . Minutes of Exercise per Session:   Stress:   . Feeling of Stress :   Social Connections:   . Frequency of Communication with Friends and Family:   . Frequency of Social Gatherings with Friends and Family:   . Attends Religious Services:   . Active Member of Clubs or Organizations:   . Attends Banker Meetings:   Marland Kitchen Marital Status:   Intimate Partner Violence:   . Fear of Current or Ex-Partner:   . Emotionally Abused:   Marland Kitchen Physically Abused:   . Sexually Abused:     Family History  Problem Relation Age of Onset  . Cancer Mother        ovarian cancer  . Cancer Father        Prostate     Current medication list and allergy/intolerance information reviewed:    Outpatient Encounter Medications as of 03/03/2020  Medication Sig  . alfuzosin (UROXATRAL) 10 MG 24 hr tablet Take 1 tablet (10 mg total) by mouth daily with breakfast.  . aspirin EC 81 MG tablet Take 1 tablet (81 mg total) by mouth daily.  Marland Kitchen atorvastatin (LIPITOR) 80 MG tablet Take 1 tablet (80 mg total) by mouth daily.  . clobetasol ointment (TEMOVATE) 0.05 % Apply 1 application topically 2 (two) times daily as needed. Do not use for more than 2 consecutive weeks. Cycle use  . fexofenadine-pseudoephedrine (ALLEGRA-D ALLERGY & CONGESTION)  180-240 MG 24 hr tablet Take 1 tablet by mouth daily.  . fluticasone (FLONASE) 50 MCG/ACT nasal spray Place 2 sprays into both nostrils daily.  . irbesartan (AVAPRO) 75 MG tablet TAKE 1 TABLET BY MOUTH DAILY  . omeprazole (PRILOSEC) 40 MG capsule Take one cap PO once daily 30 minutes AC  . sertraline (ZOLOFT) 50 MG tablet TAKE 1 TABLET(50 MG) BY MOUTH DAILY  . azelastine (OPTIVAR) 0.05 % ophthalmic solution Place 1 drop into both eyes 2 (two) times daily.  . nitroGLYCERIN (NITROSTAT) 0.4 MG SL tablet Place 1 tablet (0.4 mg total) under the tongue every 5 (five) minutes as needed for chest pain.   No facility-administered encounter medications on file as of 03/03/2020.    Allergies  Allergen Reactions  . Viagra [Sildenafil Citrate] Other (See Comments)    COLOR DISCRIMINATION [DOSE RELATED] "blue sensation"  . Metoprolol Other (See Comments)    Orthostasis, near syncope       Review of Systems: Review of  Systems - negative   Medicare Wellness Questionnaire  Are there smokers in your home (other than you)? no  Depression Screen (Note: if answer to either of the following is "Yes", a more complete depression screening is indicated)   Q1: Over the past two weeks, have you felt down, depressed or hopeless? no  Q2: Over the past two weeks, have you felt little interest or pleasure in doing things? no  Have you lost interest or pleasure in daily life? no  Do you often feel hopeless? no  Do you cry easily over simple problems? no  Activities of Daily Living In your present state of health, do you have any difficulty performing the following activities?:  Driving? no Managing money?  no Feeding yourself? no Getting from bed to chair? no Climbing a flight of stairs? no Preparing food and eating?: no Bathing or showering? no Getting dressed: no Getting to the toilet? no Using the toilet: no Moving around from place to place: no In the past year have you fallen or had a near  fall?: no  Hearing Difficulties:  Do you often ask people to speak up or repeat themselves? no Do you experience ringing or noises in your ears? yes  Do you have difficulty understanding soft or whispered voices? yes  Memory Difficulties:  Do you feel that you have a problem with memory? no  Do you often misplace items? no  Do you feel safe at home?  yes  Sexual Health:   Are you sexually active?  Yes  Do you have more than one partner?  No  Advanced Directives:   Advanced directives discussed: has an advanced directive - a copy HAS NOT been provided.  Additional information provided: no  Risk Factors  Current exercise habits: swimming  Dietary issues discussed:no concerns   Cardiac risk factors: hypertension, hypercholesterolemia/hyperlipidemia   Exam:  BP 103/70 (BP Location: Left Arm, Patient Position: Sitting)   Pulse 89   Temp (!) 97 F (36.1 C)   Ht 5' 8.5" (1.74 m)   Wt 183 lb (83 kg)   SpO2 97%   BMI 27.42 kg/m  Vision by Snellen chart: right eye:see nurse notes, left eye:see nurse notes  Constitutional: VS see above. General Appearance: alert, well-developed, well-nourished, NAD  Ears, Nose, Mouth, Throat: MMM  Neck: No masses, trachea midline.   Respiratory: Normal respiratory effort. no wheeze, no rhonchi, no rales  Cardiovascular:No lower extremity edema.   Musculoskeletal: Gait normal. No clubbing/cyanosis of digits.   Neurological: Normal balance/coordination. No tremor. Recalls 3 objects and able to read face of watch with correct time.   Skin: warm, dry, intact. No rash/ulcer.   Psychiatric: Normal judgment/insight. Normal mood and affect. Oriented x3.     ASSESSMENT/PLAN:   Encounter for Medicare annual wellness exam  Medicare annual wellness visit, subsequent - Plan: CBC, COMPLETE METABOLIC PANEL WITH GFR, Lipid panel, PSA, Total with Reflex to PSA, Free  Annual physical exam - Plan: CBC, COMPLETE METABOLIC PANEL WITH GFR, Lipid panel,  PSA, Total with Reflex to PSA, Free  Coronary artery disease involving native coronary artery of native heart without angina pectoris - Plan: CBC, COMPLETE METABOLIC PANEL WITH GFR, Lipid panel, PSA, Total with Reflex to PSA, Free  Dyslipidemia, goal LDL below 70 - Plan: CBC, COMPLETE METABOLIC PANEL WITH GFR, Lipid panel, PSA, Total with Reflex to PSA, Free  Status post four vessel coronary artery bypass - Plan: CBC, COMPLETE METABOLIC PANEL WITH GFR, Lipid panel, PSA, Total with Reflex  to PSA, Free  Family history of prostate cancer in father - Plan: CBC, COMPLETE METABOLIC PANEL WITH GFR, Lipid panel, PSA, Total with Reflex to PSA, Free  Stage 3 chronic kidney disease, unspecified whether stage 3a or 3b CKD - Plan: CBC, COMPLETE METABOLIC PANEL WITH GFR, Lipid panel, PSA, Total with Reflex to PSA, Free  Colon cancer screening declined - Plan: CBC, COMPLETE METABOLIC PANEL WITH GFR, Lipid panel, PSA, Total with Reflex to PSA, Free  Status post CVA - Plan: CBC, COMPLETE METABOLIC PANEL WITH GFR, Lipid panel, PSA, Total with Reflex to PSA, Free  Lower urinary tract symptoms (LUTS) - Plan: CBC, COMPLETE METABOLIC PANEL WITH GFR, Lipid panel, PSA, Total with Reflex to PSA, Free  Atherosclerosis - Plan: CBC, COMPLETE METABOLIC PANEL WITH GFR, Lipid panel, PSA, Total with Reflex to PSA, Free  History of colonic polyps - Plan: CBC, COMPLETE METABOLIC PANEL WITH GFR, Lipid panel, PSA, Total with Reflex to PSA, Free    During the course of the visit the patient was educated and counseled about appropriate screening and preventive services as noted above.   Patient Instructions (the written plan) was given to the patient.  Medicare Attestation I have personally reviewed: The patient's medical and social history Their use of alcohol, tobacco or illicit drugs Their current medications and supplements The patient's functional ability including ADLs,fall risks, home safety risks, cognitive, and  hearing and visual impairment Diet and physical activities Evidence for depression or mood disorders  The patient's weight, height, BMI, and visual acuity have been recorded in the chart.  I have made referrals, counseling, and provided education to the patient based on review of the above and I have provided the patient with a written personalized care plan for preventive services.     Sunnie Nielsen, DO   03/03/20   Visit summary with medication list and pertinent instructions was printed for patient to review. All questions at time of visit were answered - patient instructed to contact office with any additional concerns. ER/RTC precautions were reviewed with the patient. Follow-up plan: Return in about 1 year (around 03/03/2021) for [place] (call week prior to visit for lab orders).

## 2020-03-03 NOTE — Patient Instructions (Signed)
General Preventive Care  Most recent routine screening labs: ordered today.   Blood pressure goal 140/90 or less, ideally 130/80 or less.   Tobacco: don't!   Alcohol: responsible moderation is ok for most adults - if you have concerns about your alcohol intake, please talk to me!   Exercise: as tolerated to reduce risk of cardiovascular disease and diabetes. Strength training will also prevent osteoporosis.   Mental health: if need for mental health care (medicines, counseling, other), or concerns about moods, please let me know!   Sexual / Reproductive health: if need for STD testing, or if concerns with libido/pain problems, please let me know!  Advanced Directive: Living Will and/or Healthcare Power of Attorney recommended for all adults, regardless of age or health.  Vaccines  Flu vaccine: recommended every fall.   Shingles vaccine: all done!   Pneumonia vaccines: all done!   Tetanus booster: booster due 2024  COVID vaccine: THANKS for getting your vaccine! :)  Cancer screenings   Colon cancer screening: for everyone age 19-75. Colonoscopy done 2019, please follow-up as directed by GI   Prostate cancer screening: PSA blood test age 54-71. May be elevated in men 75 or over, may not be covered by insurance!   Lung cancer screening: not needed since quit >15 years ago  Infection screenings  . HIV: recommended screening at least once age 25-65 . Gonorrhea/Chlamydia: screening as needed . Hepatitis C: recommended once for everyone age 43-75 - done 2016 . TB: certain at-risk populations Other . Bone Density Test: recommended for men at age 24, I can't find previous testing on this!  . Abdominal Aortic Aneurysm: screening done 2018, recommended repeat in 5 years, due 2023

## 2020-03-04 LAB — CBC
HCT: 39 % (ref 38.5–50.0)
Hemoglobin: 13 g/dL — ABNORMAL LOW (ref 13.2–17.1)
MCH: 30.7 pg (ref 27.0–33.0)
MCHC: 33.3 g/dL (ref 32.0–36.0)
MCV: 92.2 fL (ref 80.0–100.0)
MPV: 10.2 fL (ref 7.5–12.5)
Platelets: 210 10*3/uL (ref 140–400)
RBC: 4.23 10*6/uL (ref 4.20–5.80)
RDW: 12 % (ref 11.0–15.0)
WBC: 5.5 10*3/uL (ref 3.8–10.8)

## 2020-03-04 LAB — COMPLETE METABOLIC PANEL WITH GFR
AG Ratio: 1.6 (calc) (ref 1.0–2.5)
ALT: 28 U/L (ref 9–46)
AST: 22 U/L (ref 10–35)
Albumin: 4.2 g/dL (ref 3.6–5.1)
Alkaline phosphatase (APISO): 88 U/L (ref 35–144)
BUN/Creatinine Ratio: 16 (calc) (ref 6–22)
BUN: 26 mg/dL — ABNORMAL HIGH (ref 7–25)
CO2: 24 mmol/L (ref 20–32)
Calcium: 9.6 mg/dL (ref 8.6–10.3)
Chloride: 106 mmol/L (ref 98–110)
Creat: 1.61 mg/dL — ABNORMAL HIGH (ref 0.70–1.18)
GFR, Est African American: 48 mL/min/{1.73_m2} — ABNORMAL LOW (ref >=60)
GFR, Est Non African American: 41 mL/min/{1.73_m2} — ABNORMAL LOW (ref >=60)
Globulin: 2.7 g/dL (calc) (ref 1.9–3.7)
Glucose, Bld: 98 mg/dL (ref 65–99)
Potassium: 5 mmol/L (ref 3.5–5.3)
Sodium: 139 mmol/L (ref 135–146)
Total Bilirubin: 0.8 mg/dL (ref 0.2–1.2)
Total Protein: 6.9 g/dL (ref 6.1–8.1)

## 2020-03-04 LAB — PSA, TOTAL WITH REFLEX TO PSA, FREE: PSA, Total: 0.8 ng/mL (ref <=4.0)

## 2020-03-04 LAB — LIPID PANEL
Cholesterol: 168 mg/dL (ref <200)
HDL: 50 mg/dL (ref >=40)
LDL Cholesterol (Calc): 90 mg/dL (calc)
Non-HDL Cholesterol (Calc): 118 mg/dL (calc) (ref <130)
Total CHOL/HDL Ratio: 3.4 (calc) (ref <5.0)
Triglycerides: 179 mg/dL — ABNORMAL HIGH (ref <150)

## 2020-04-08 NOTE — Telephone Encounter (Signed)
This encounter was created in error - please disregard.

## 2020-04-22 ENCOUNTER — Other Ambulatory Visit: Payer: Self-pay

## 2020-04-22 ENCOUNTER — Ambulatory Visit (INDEPENDENT_AMBULATORY_CARE_PROVIDER_SITE_OTHER): Payer: Medicare HMO | Admitting: Osteopathic Medicine

## 2020-04-22 DIAGNOSIS — Z23 Encounter for immunization: Secondary | ICD-10-CM

## 2020-04-22 NOTE — Addendum Note (Signed)
Addended by: Deno Etienne on: 04/22/2020 09:39 AM   Modules accepted: Level of Service

## 2020-04-22 NOTE — Progress Notes (Signed)
Pt here for flu shot. Afebrile,no recent illness. Vaccination given, pt tolerated well.  

## 2020-04-25 ENCOUNTER — Encounter: Payer: Self-pay | Admitting: Osteopathic Medicine

## 2020-04-25 ENCOUNTER — Ambulatory Visit (INDEPENDENT_AMBULATORY_CARE_PROVIDER_SITE_OTHER): Payer: Medicare HMO | Admitting: Osteopathic Medicine

## 2020-04-25 ENCOUNTER — Other Ambulatory Visit: Payer: Self-pay

## 2020-04-25 VITALS — BP 96/66 | HR 84 | Wt 183.0 lb

## 2020-04-25 DIAGNOSIS — K219 Gastro-esophageal reflux disease without esophagitis: Secondary | ICD-10-CM

## 2020-04-25 DIAGNOSIS — S86812A Strain of other muscle(s) and tendon(s) at lower leg level, left leg, initial encounter: Secondary | ICD-10-CM | POA: Diagnosis not present

## 2020-04-25 MED ORDER — DICLOFENAC SODIUM 1 % EX GEL
4.0000 g | Freq: Four times a day (QID) | CUTANEOUS | 11 refills | Status: DC
Start: 2020-04-25 — End: 2020-10-17

## 2020-04-25 MED ORDER — OMEPRAZOLE 40 MG PO CPDR: DELAYED_RELEASE_CAPSULE | ORAL | 3 refills | Status: DC

## 2020-04-25 NOTE — Progress Notes (Signed)
HPI: Leonard Padilla is a 74 y.o. 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), Essential hypertension (10/13/2015), Flu (09/2015), Former smoker, stopped smoking in distant past, GERD (gastroesophageal reflux disease), Glaucoma suspect of both eyes (08/12/2018), Heart murmur, History of colon polyps, Hyperlipidemia, Hypertensive retinopathy of both eyes (08/12/2018), 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  he presents to [facility] today, 04/25/20,  for chief complaint of: Left leg pain  Patient reports one month ago he was lifting a heavy box and he began noting left left pain. He states the pain has primarily been to the back and side of the left lower leg. He states for a while it was radiating from his knee to his ankle and then from his ankle to his knee. He notes a small amount of pain in the side of his thigh and his buttock, but mostly it has been in his left calf. He states the pain is worst when he lies flat and was keeping him awake for two weeks. Pain improves when he sits up in his reclining chair. He denies any back pain, numbness/tingling, saddle anesthesia, urinary or fecal incontinence. He has been trying some exercises he found online with some improvement. He tried taking ibuprofen/motrin for one day, but states it upset his stomach for several days. He reports  improvement today but not resolution.   Past medical, surgical, social and family history reviewed:  Patient Active Problem List   Diagnosis Date Noted  . CAD (coronary artery disease) 10/21/2019  . Allergic conjunctivitis of left eye 10/16/2019  . History of colonic polyps 03/02/2019  . Family history of prostate cancer in father 03/02/2019  . SBE (subacute bacterial endocarditis) prophylaxis candidate 10/06/2018  . Glaucoma suspect of both eyes 08/12/2018  . Combined form of age-related cataract, both eyes 08/12/2018  . Hypertensive retinopathy of both eyes 08/12/2018  . Posterior vitreous detachment, left eye 08/12/2018  . Colon cancer screening declined 05/27/2018  . 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  . Seasonal allergic rhinitis 11/08/2017  . Bilateral leg edema 09/24/2017  . Urinary retention due to benign prostatic hyperplasia 09/13/2017  . S/P AVR 09/09/2017  . Atherosclerosis 08/19/2017  . Diastolic dysfunction without heart failure 08/19/2017  . 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 Lower urinary tract symptoms (LUTS) 04/29/2017  . Former smoker, stopped smoking in distant past   . Dysthymia 03/18/2017  . Irritability and anger 03/18/2017  . 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 Dyslipidemia, goal LDL below 70 03/18/2017  . 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 Postural dizziness with presyncope 08/27/2016  . CKD (chronic kidney disease) stage 3, GFR 30-59 ml/min (HCC) 08/04/2016  . Status post CVA 08/03/2016  . Status post carotid endarterectomy 12/15/2015  . Obstructive sleep apnea 11/29/2015  . GERD (gastroesophageal reflux disease) 10/13/2015  . Hypertension goal BP (blood pressure) < 140/90 10/13/2015  . Psoriasis 12/02/2014    Past Surgical History:  Procedure Laterality Date  . AORTIC VALVE REPLACEMENT N/A 09/09/2017   Procedure: AORTIC VALVE REPLACEMENT (AVR) using  a 23 [employer] consultant Aortic Valve;   Surgeon: Delight Ovens, MD;  Location: [facility] OR;  Service: Open Heart Surgery;  Laterality: N/A;  . COLONOSCOPY W/ BIOPSIES AND POLYPECTOMY    . CORONARY ARTERY BYPASS GRAFT N/A 09/09/2017   Procedure: CORONARY ARTERY BYPASS GRAFTING (CABG) x 4 using the left internal mammary artery and right greater saphenous vein harvested endoscopically.;  Surgeon: Delight Ovens, MD;  Location: [facility] OR;  Service: Open Heart Surgery;  Laterality: N/A;  . ENDARTERECTOMY Right 12/15/2015   Procedure: RIGHT CAROTID ENDARTERECTOMY WITH PATCH ANGIOPLASTY;  Surgeon: Nada Libman, MD;  Location: [facility] OR;  Service: Vascular;  Laterality: Right;  . PATCH ANGIOPLASTY Right 12/15/2015   Procedure: RIGHT CAROTID PATCH ANGIOPLASTY;  Surgeon: Nada Libman, MD;  Location: [facility] OR;  Service: Vascular;  Laterality: Right;  . RIGHT/LEFT HEART CATH AND CORONARY ANGIOGRAPHY N/A 08/23/2017   Procedure: RIGHT/LEFT HEART CATH AND CORONARY ANGIOGRAPHY;  Surgeon: Swazil[place], Peter M, MD;  Location: [facility] INVASIVE CV LAB;  Service: Cardiovascular;  Laterality: N/A;  . TEE WITHOUT CARDIOVERSION N/A 09/09/2017   Procedure: TRANSESOPHAGEAL ECHOCARDIOGRAM (TEE);  Surgeon: Delight Ovens, MD;  Location: [facility] OR;  Service: Open Heart Surgery;  Laterality: N/A;  . WISDOM TOOTH EXTRACTION      Social History   Tobacco Use  . Smoking status: Former Smoker    Quit date: 08/07/1983    Years since quitting: 36.7  . Smokeless tobacco: Never Used  Substance Use Topics  . Alcohol use: Yes    Comment: 1 glass of wine daily     Family History  Problem Relation Age of Onset  . Cancer Mother        ovarian cancer  . Cancer Father        Prostate     Current medication list and allergy/intolerance information reviewed:    Current Outpatient Medications  Medication Sig Dispense Refill  . alfuzosin (UROXATRAL) 10 MG 24 hr tablet Take 1 tablet (10 mg total) by mouth daily with breakfast. 90 tablet 3  . aspirin EC 81 MG tablet Take 1 tablet (81 mg  total) by mouth daily. 90 tablet 1  . atorvastatin (LIPITOR) 80 MG tablet Take 1 tablet (80 mg total) by mouth daily. 90 tablet 3  . azelastine (OPTIVAR) 0.05 % ophthalmic solution Place 1 drop into both eyes 2 (two) times daily. 6 mL 2  . clobetasol ointment (TEMOVATE) 0.05 % Apply 1 application topically 2 (two) times daily as needed. Do not use for more than 2 consecutive weeks. Cycle use 60 g 1  . diclofenac Sodium (VOLTAREN) 1 % GEL Apply 4 g topically 4 (four) times daily. To affected area. 150 g 11  . fexofenadine-pseudoephedrine (ALLEGRA-D ALLERGY & CONGESTION) 180-240 MG 24 hr tablet Take 1 tablet by mouth daily. 90 tablet 3  . fluticasone (FLONASE) 50 MCG/ACT nasal spray Place 2 sprays into both nostrils daily. 16 g 11  . irbesartan (AVAPRO) 75 MG tablet TAKE 1 TABLET BY MOUTH DAILY 90 tablet 1  . nitroGLYCERIN (NITROSTAT) 0.4 MG SL tablet Place 1 tablet (0.4 mg total) under the tongue every 5 (five) minutes as needed for chest pain. 25 tablet 6  . omeprazole (PRILOSEC) 40 MG capsule Take one cap PO once daily 30 minutes AC 90 capsule 3  . sertraline (ZOLOFT) 50 MG tablet TAKE 1 TABLET(50 MG) BY MOUTH DAILY 90 tablet 3   No current facility-administered medications for this visit.    Allergies  Allergen Reactions  . Viagra [Sildenafil Citrate] Other (See Comments)    COLOR DISCRIMINATION [DOSE RELATED] "blue sensation"  . Metoprolol Other (See Comments)    Orthostasis, near syncope      Review of Systems:  Constitutional:  No  fever, no chills   HEENT: No  headache, no vision change  Cardiac: No  chest pain  Respiratory:  No  shortness of breath. No  Cough  Gastrointestinal: No  abdominal pain, No  nausea, No  vomiting  Musculoskeletal: + left leg pain, no back pain  Skin: No  Rash, No other wounds/concerning lesions  Genitourinary: No  incontinence   Neurologic: No  weakness, No  dizziness  Psychiatric: No  concerns with depression, + sleep disturbance  Exam:   BP 96/66 (BP Location: Left Arm, Patient Position: Sitting)   Pulse 84   Wt 183 lb (83 kg)   SpO2 98%   BMI 27.42 kg/m   Constitutional: VS see above. General Appearance: alert, well-developed, well-nourished, NAD  Eyes: Normal lids and conjunctive, non-icteric sclera  Neck: No masses, trachea midline.   Respiratory: Normal respiratory effort. no wheeze, no rhonchi, no rales  Cardiovascular: S1/S2 normal, no murmur, no rub/gallop auscultated. RRR. Neg Homan's sign   Gastrointestinal: Nontender, no masses.   Musculoskeletal: No vertebral or paraspinal tenderness. Negative SLR. Normal ROM of left knee and ankle. No focal tenderness. No overlying skin changes or edema. Gait normal. No clubbing/cyanosis of digits.   Neurological: Normal balance/coordination. No tremor. No cranial nerve deficit on limited exam. Motor and sensation intact and symmetric. Cerebellar reflexes intact.   Skin: warm, dry, intact. No rash/ulcer. No concerning nevi or subq nodules on limited exam.    Psychiatric: Normal judgment/insight. Normal mood and affect. Oriented x3.    No results found for this or any previous visit (from the past 72 hour(s)).  No results found.   ASSESSMENT/PLAN: The primary encounter diagnosis was Strain of calf muscle, left, initial encounter. A diagnosis of Gastroesophageal reflux disease without esophagitis was also pertinent to this visit.   Left leg pain  Symptoms and exam not consistent with sciatic pain. I suspect muscle strain in the calf, which patient reports is improving.  Applied ACE wrap to upper left calf in office. Sent rx for voltaren gel.  If symptoms worsen or do not improve, could refer for PT or for further evaluation with Dr. Karie Schwalbe.  No orders of the defined types were placed in this encounter.   Meds ordered this encounter  Medications  . omeprazole (PRILOSEC) 40 MG capsule    Sig: Take one cap PO once daily 30 minutes AC    Dispense:  90 capsule     Refill:  3  . diclofenac Sodium (VOLTAREN) 1 % GEL    Sig: Apply 4 g topically 4 (four) times daily. To affected area.    Dispense:  150 g    Refill:  11    There are no Patient Instructions on file for this visit.      Visit summary with medication list and pertinent instructions was printed for patient to review. All questions at time of visit were answered - patient instructed to contact office with any additional concerns or updates. ER/RTC precautions were reviewed with the patient.   Please note: voice recognition software was used to produce this document, and typos may escape review. Please contact Dr. Lyn Hollingshead for any needed clarifications.     Follow-up plan: Return if symptoms worsen or fail to improve.  Gweneth Dimitri, [facility] MS3

## 2020-04-27 ENCOUNTER — Encounter: Payer: Self-pay | Admitting: Cardiology

## 2020-04-27 ENCOUNTER — Other Ambulatory Visit: Payer: Self-pay

## 2020-04-27 ENCOUNTER — Ambulatory Visit: Payer: Medicare HMO | Admitting: Cardiology

## 2020-04-27 VITALS — BP 84/58 | HR 86 | Ht 68.0 in | Wt 182.1 lb

## 2020-04-27 DIAGNOSIS — I10 Essential (primary) hypertension: Secondary | ICD-10-CM

## 2020-04-27 DIAGNOSIS — I2510 Atherosclerotic heart disease of native coronary artery without angina pectoris: Secondary | ICD-10-CM

## 2020-04-27 DIAGNOSIS — Z951 Presence of aortocoronary bypass graft: Secondary | ICD-10-CM

## 2020-04-27 DIAGNOSIS — E785 Hyperlipidemia, unspecified: Secondary | ICD-10-CM | POA: Diagnosis not present

## 2020-04-27 DIAGNOSIS — Z952 Presence of prosthetic heart valve: Secondary | ICD-10-CM

## 2020-04-27 NOTE — Patient Instructions (Addendum)
Medication Instructions:  Your physician has recommended you make the following change in your medication:   Stop Irbesartan  *If you need a refill on your cardiac medications before your next appointment, please call your pharmacy*   Lab Work: None ordered If you have labs (blood work) drawn today and your tests are completely normal, you will receive your results only by:  MyChart Message (if you have MyChart) OR  A paper copy in the mail If you have any lab test that is abnormal or we need to change your treatment, we will call you to review the results.   Testing/Procedures: Your physician has requested that you have an echocardiogram. Echocardiography is a painless test that uses sound waves to create images of your heart. It provides your doctor with information about the size and shape of your heart and how well your hearts chambers and valves are working. This procedure takes approximately one hour. There are no restrictions for this procedure.     Follow-Up: At [facility], you and your health needs are our priority.  As part of our continuing mission to provide you with exceptional heart care, we have created designated Provider Care Teams.  These Care Teams include your primary Cardiologist (physician) and Advanced Practice Providers (APPs -  Physician Assistants and Nurse Practitioners) who all work together to provide you with the care you need, when you need it.  We recommend signing up for the patient portal called "MyChart".  Sign up information is provided on this After Visit Summary.  MyChart is used to connect with patients for Virtual Visits (Telemedicine).  Patients are able to view lab/test results, encounter notes, upcoming appointments, etc.  Non-urgent messages can be sent to your provider as well.   To learn more about what you can do with MyChart, go to [email or web address].    Your next appointment:   6 month(s)  The format for your next  appointment:   In Person  Provider:   Belva Crome, MD   Other Instructions  Echocardiogram An echocardiogram is a procedure that uses painless sound waves (ultrasound) to produce an image of the heart. Images from an echocardiogram can provide important information about:  Signs of coronary artery disease (CAD).  Aneurysm detection. An aneurysm is a weak or damaged part of an artery wall that bulges out from the normal force of blood pumping through the body.  Heart size and shape. Changes in the size or shape of the heart can be associated with certain conditions, including heart failure, aneurysm, and CAD.  Heart muscle function.  Heart valve function.  Signs of a past heart attack.  Fluid buildup around the heart.  Thickening of the heart muscle.  A tumor or infectious growth around the heart valves. Tell a health care provider about:  Any allergies you have.  All medicines you are taking, including vitamins, herbs, eye drops, creams, and over-the-counter medicines.  Any blood disorders you have.  Any surgeries you have had.  Any medical conditions you have.  Whether you are pregnant or may be pregnant. What are the risks? Generally, this is a safe procedure. However, problems may occur, including:  Allergic reaction to dye (contrast) that may be used during the procedure. What happens before the procedure? No specific preparation is needed. You may eat and drink normally. What happens during the procedure?   An IV tube may be inserted into one of your veins.  You may receive contrast through this tube. A contrast is  an injection that improves the quality of the pictures from your heart.  A gel will be applied to your chest.  A wand-like tool (transducer) will be moved over your chest. The gel will help to transmit the sound waves from the transducer.  The sound waves will harmlessly bounce off of your heart to allow the heart images to be captured in  real-time motion. The images will be recorded on a computer. The procedure may vary among health care providers and hospitals. What happens after the procedure?  You may return to your normal, everyday life, including diet, activities, and medicines, unless your health care provider tells you not to do that. Summary  An echocardiogram is a procedure that uses painless sound waves (ultrasound) to produce an image of the heart.  Images from an echocardiogram can provide important information about the size and shape of your heart, heart muscle function, heart valve function, and fluid buildup around your heart.  You do not need to do anything to prepare before this procedure. You may eat and drink normally.  After the echocardiogram is completed, you may return to your normal, everyday life, unless your health care provider tells you not to do that. This information is not intended to replace advice given to you by your health care provider. Make sure you discuss any questions you have with your health care provider. Document Revised: 11/13/2018 Document Reviewed: 08/25/2016 Elsevier Patient Education  2020 [employer].

## 2020-04-27 NOTE — Progress Notes (Signed)
Cardiology Office Note:    Date:  04/27/2020   ID:  Leonard Padilla, Leonard Padilla 1945/09/02, MRN [id number]  PCP:  Sunnie Nielsen, DO  Cardiologist:  Garwin Brothers, MD   Referring MD: Sunnie Nielsen, DO    ASSESSMENT:    1. Coronary artery disease involving native coronary artery of native heart without angina pectoris   2. Dyslipidemia, goal LDL below 70   3. Hypertension goal BP (blood pressure) < 140/90   4. Status post four vessel coronary artery bypass   5. S/P AVR    PLAN:    In order of problems listed above:  1. Coronary artery disease: Secondary prevention stressed with the patient.  Importance of compliance with diet medication stressed any vocalized understanding.  Importance of regular exercise stressed and I told him to walk at least half an hour a day 5 days a week. 2. Essential hypertension: Blood pressure is low and I am concerned about his renal insufficiency and such issues and therefore I will stop his blood pressure medication.  He also has symptoms of postural hypotension. 3. Mixed dyslipidemia: Diet was emphasized and recent lipids were obtained from [facility] sheets and they are fine 4. S/p aortic valve replacement: Stable at this time.  We will do an echocardiogram to follow-up on this 1. 5. Patient will be seen in follow-up appointment in 6 months or earlier if the patient has any concerns    Medication Adjustments/Labs and Tests Ordered: Current medicines are reviewed at length with the patient today.  Concerns regarding medicines are outlined above.  No orders of the defined types were placed in this encounter.  No orders of the defined types were placed in this encounter.    No chief complaint on file.    History of Present Illness:    Leonard Padilla is a 74 y.o. male.  Patient has past medical history of coronary artery disease and post aortic valve replacement.  He has history of essential hypertension dyslipidemia and renal insufficiency.  He denies  any problems at this time and takes care of activities of daily living.  No chest pain orthopnea or PND.  He is very concerned about low and borderline blood pressure that he adheres when he checks it regularly.  At the time of my evaluation, the patient is alert awake oriented and in no distress.  Past Medical History:  Diagnosis Date  . Aortic stenosis    moderate AS 12/01/15 echo (Dr. Elberta Fortis)  . Arthritis   . CKD (chronic kidney disease) stage 3, GFR 30-59 ml/min 08/04/2016  . CKD stage G3a/A1, GFR 45-59 and albumin creatinine ratio <30 mg/g 08/04/2016  . Combined form of age-related cataract, both eyes 08/12/2018  . Coronary artery disease   . Dysthymia 03/18/2017  . 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 Essential hypertension 10/13/2015  . Flu 09/2015   influenza A  . Former smoker, stopped smoking in distant past   . GERD (gastroesophageal reflux disease)    occ  . Glaucoma suspect of both eyes 08/12/2018  . Heart murmur   . History of colon polyps   . Hyperlipidemia   . Hypertensive retinopathy of both eyes 08/12/2018  . 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 Pneumonia 09/2015  . Posterior vitreous detachment, left eye 08/12/2018  . Postural dizziness with presyncope 08/27/2016  . Psoriasis   . Sleep apnea    to pick cpap tomorrow 12/13/15  . Stroke [facility])  09/2015  . Symptomatic carotid artery stenosis 12/15/2015  . 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 Wears glasses     Past Surgical History:  Procedure Laterality Date  . AORTIC VALVE REPLACEMENT N/A 09/09/2017   Procedure: AORTIC VALVE REPLACEMENT (AVR) using a 23 Edwards Perimount Magna Ease Aortic Valve;  Surgeon: Delight Ovens, MD;  Location: [facility] OR;  Service: Open Heart Surgery;  Laterality: N/A;  . COLONOSCOPY W/ BIOPSIES AND POLYPECTOMY    . CORONARY ARTERY BYPASS GRAFT N/A 09/09/2017   Procedure: CORONARY ARTERY BYPASS GRAFTING (CABG) x 4 using the left internal mammary artery and  right greater saphenous vein harvested endoscopically.;  Surgeon: Delight Ovens, MD;  Location: [facility] OR;  Service: Open Heart Surgery;  Laterality: N/A;  . ENDARTERECTOMY Right 12/15/2015   Procedure: RIGHT CAROTID ENDARTERECTOMY WITH PATCH ANGIOPLASTY;  Surgeon: Nada Libman, MD;  Location: [facility] OR;  Service: Vascular;  Laterality: Right;  . PATCH ANGIOPLASTY Right 12/15/2015   Procedure: RIGHT CAROTID PATCH ANGIOPLASTY;  Surgeon: Nada Libman, MD;  Location: [facility] OR;  Service: Vascular;  Laterality: Right;  . RIGHT/LEFT HEART CATH AND CORONARY ANGIOGRAPHY N/A 08/23/2017   Procedure: RIGHT/LEFT HEART CATH AND CORONARY ANGIOGRAPHY;  Surgeon: Swaziland, Peter M, MD;  Location: [facility] INVASIVE CV LAB;  Service: Cardiovascular;  Laterality: N/A;  . TEE WITHOUT CARDIOVERSION N/A 09/09/2017   Procedure: TRANSESOPHAGEAL ECHOCARDIOGRAM (TEE);  Surgeon: Delight Ovens, MD;  Location: [facility] OR;  Service: Open Heart Surgery;  Laterality: N/A;  . WISDOM TOOTH EXTRACTION      Current Medications: Current Meds  Medication Sig  . alfuzosin (UROXATRAL) 10 MG 24 hr tablet Take 1 tablet (10 mg total) by mouth daily with breakfast.  . aspirin EC 81 MG tablet Take 1 tablet (81 mg total) by mouth daily.  Marland Kitchen atorvastatin (LIPITOR) 80 MG tablet Take 1 tablet (80 mg total) by mouth daily.  . clobetasol ointment (TEMOVATE) 0.05 % Apply 1 application topically 2 (two) times daily as needed. Do not use for more than 2 consecutive weeks. Cycle use  . diclofenac Sodium (VOLTAREN) 1 % GEL Apply 4 g topically 4 (four) times daily. To affected area.  . fexofenadine-pseudoephedrine (ALLEGRA-D ALLERGY & CONGESTION) 180-240 MG 24 hr tablet Take 1 tablet by mouth daily.  . fluticasone (FLONASE) 50 MCG/ACT nasal spray Place 2 sprays into both nostrils daily.  . irbesartan (AVAPRO) 75 MG tablet TAKE 1 TABLET BY MOUTH DAILY  . omeprazole (PRILOSEC) 40 MG capsule Take one cap PO once daily 30 minutes AC  . sertraline (ZOLOFT) 50 MG  tablet TAKE 1 TABLET(50 MG) BY MOUTH DAILY     Allergies:   Viagra [sildenafil citrate] and Metoprolol   Social History   Socioeconomic History  . Marital status: Married    Spouse name: [name]  . Number of children: 2  . Years of education: 47  . Highest education level: Not on file  Occupational History  . Occupation: Retired    Comment: retired Clinical biochemist, [employer]  Tobacco Use  . Smoking status: Former Smoker    Quit date: 08/07/1983    Years since quitting: 36.7  . Smokeless tobacco: Never Used  Vaping Use  . Vaping Use: Never used  Substance and Sexual Activity  . Alcohol use: Yes    Comment: 1 glass of wine daily   . Drug use: No  . Sexual activity: Not Currently  Other Topics Concern  . Not on file  Social History Narrative   Lives with  wife   Caffeine use- coffee 3-4 cups daily   Social Determinants of Health   Financial Resource Strain:   . Difficulty of Paying Living Expenses: Not on file  Food Insecurity:   . Worried About Programme researcher, broadcasting/film/video in the Last Year: Not on file  . Ran Out of Food in the Last Year: Not on file  Transportation Needs:   . Lack of Transportation (Medical): Not on file  . Lack of Transportation (Non-Medical): Not on file  Physical Activity:   . Days of Exercise per Week: Not on file  . Minutes of Exercise per Session: Not on file  Stress:   . Feeling of Stress : Not on file  Social Connections:   . Frequency of Communication with Friends and Family: Not on file  . Frequency of Social Gatherings with Friends and Family: Not on file  . Attends Religious Services: Not on file  . Active Member of Clubs or Organizations: Not on file  . Attends Banker Meetings: Not on file  . Marital Status: Not on file     Family History: The patient's family history includes Cancer in his father and mother.  ROS:   Please see the history of present illness.    All other systems reviewed and are negative.  EKGs/Labs/Other  Studies Reviewed:    The following studies were reviewed today: I discussed my findings with the patient in extensive length     Recent Labs: 03/03/2020: ALT 28; BUN 26; Creat 1.61; Hemoglobin 13.0; Platelets 210; Potassium 5.0; Sodium 139  Recent Lipid Panel    Component Value Date/Time   CHOL 168 03/03/2020 1030   CHOL 135 04/30/2019 1009   TRIG 179 (H) 03/03/2020 1030   HDL 50 03/03/2020 1030   HDL 55 04/30/2019 1009   CHOLHDL 3.4 03/03/2020 1030   VLDL 23 08/03/2016 1115   LDLCALC 90 03/03/2020 1030    Physical Exam:    VS:  BP (!) 84/58   Pulse 86   Ht 5\' 8"  (1.727 m)   Wt 182 lb 1.3 oz (82.6 kg)   SpO2 97%   BMI 27.69 kg/m     Wt Readings from Last 3 Encounters:  04/27/20 182 lb 1.3 oz (82.6 kg)  04/25/20 183 lb (83 kg)  03/03/20 183 lb (83 kg)     GEN: Patient is in no acute distress HEENT: Normal NECK: No JVD; No carotid bruits LYMPHATICS: No lymphadenopathy CARDIAC: Hear sounds regular, 2/6 systolic murmur at the apex. RESPIRATORY:  Clear to auscultation without rales, wheezing or rhonchi  ABDOMEN: Soft, non-tender, non-distended MUSCULOSKELETAL:  No edema; No deformity  SKIN: Warm and dry NEUROLOGIC:  Alert and oriented x 3 PSYCHIATRIC:  Normal affect   Signed, 03/05/20, MD  04/27/2020 8:40 AM    [facility]

## 2020-05-11 ENCOUNTER — Encounter: Payer: Self-pay | Admitting: Osteopathic Medicine

## 2020-05-11 DIAGNOSIS — S86812A Strain of other muscle(s) and tendon(s) at lower leg level, left leg, initial encounter: Secondary | ICD-10-CM

## 2020-05-18 ENCOUNTER — Ambulatory Visit (HOSPITAL_BASED_OUTPATIENT_CLINIC_OR_DEPARTMENT_OTHER)
Admission: RE | Admit: 2020-05-18 | Discharge: 2020-05-18 | Disposition: A | Discharge status: Home / Self Care | Payer: Medicare HMO | Source: Ambulatory Visit | Attending: Cardiology | Admitting: Cardiology

## 2020-05-18 ENCOUNTER — Other Ambulatory Visit: Payer: Self-pay

## 2020-05-18 DIAGNOSIS — Z951 Presence of aortocoronary bypass graft: Secondary | ICD-10-CM | POA: Diagnosis not present

## 2020-05-18 DIAGNOSIS — I2510 Atherosclerotic heart disease of native coronary artery without angina pectoris: Secondary | ICD-10-CM | POA: Diagnosis present

## 2020-05-18 LAB — ECHOCARDIOGRAM COMPLETE
AR max vel: 2.48 cm2
AV Area VTI: 2.9 cm2
AV Area mean vel: 2.76 cm2
AV Mean grad: 3 mmHg
AV Peak grad: 6.7 mmHg
Ao pk vel: 1.29 m/s
Area-P 1/2: 4.06 cm2
S' Lateral: 2.42 cm

## 2020-05-19 ENCOUNTER — Encounter: Payer: Self-pay | Admitting: Osteopathic Medicine

## 2020-05-19 ENCOUNTER — Telehealth: Payer: Self-pay

## 2020-05-19 NOTE — Telephone Encounter (Signed)
-----   Message from Rajan R Revankar, MD sent at 05/18/2020 10:58 PM EDT ----- The results of the study is unremarkable. Please inform patient. I will discuss in detail at next appointment. Cc  primary care/referring physician Rajan R Revankar, MD 05/18/2020 10:58 PM  

## 2020-05-19 NOTE — Telephone Encounter (Signed)
Spoke with patient regarding results and recommendation.  Patient verbalizes understanding and is agreeable to plan of care. Advised patient to call back with any issues or concerns.  

## 2020-05-20 ENCOUNTER — Ambulatory Visit (INDEPENDENT_AMBULATORY_CARE_PROVIDER_SITE_OTHER): Payer: Medicare HMO | Admitting: Rehabilitative and Restorative Service Providers"

## 2020-05-20 ENCOUNTER — Encounter: Payer: Self-pay | Admitting: Rehabilitative and Restorative Service Providers"

## 2020-05-20 ENCOUNTER — Other Ambulatory Visit: Payer: Self-pay

## 2020-05-20 DIAGNOSIS — M79662 Pain in left lower leg: Secondary | ICD-10-CM | POA: Diagnosis not present

## 2020-05-20 DIAGNOSIS — R2689 Other abnormalities of gait and mobility: Secondary | ICD-10-CM

## 2020-05-20 DIAGNOSIS — G5702 Lesion of sciatic nerve, left lower limb: Secondary | ICD-10-CM | POA: Diagnosis not present

## 2020-05-20 DIAGNOSIS — R29898 Other symptoms and signs involving the musculoskeletal system: Secondary | ICD-10-CM

## 2020-05-20 NOTE — Patient Instructions (Signed)
Access Code: [email or web address]: [email or web address]: 10/15/2021Prepared by: Tomeika Weinmann HoltExercises  Supine Piriformis Stretch with Leg Straight - 2 x daily - 7 x weekly - 1 sets - 3 reps - 30 sec hold  Supine Single Knee to Chest - 2 x daily - 7 x weekly - 1 sets - 3 reps - 30 sec hold  Gastroc Stretch on Wall - 2 x daily - 7 x weekly - 1 sets - 3 reps - 30 sec hold  Soleus Stretch on Wall - 2 x daily - 7 x weekly - 1 sets - 3 reps - 30 sec hold Patient Education  TENS Unit  Trigger Point Dry Needling

## 2020-05-20 NOTE — Therapy (Signed)
[facility] [address] [place], [place], [address] Phone: [phone]   Fax:  [phone]  Physical Therapy Evaluation  Patient Details  Name: Leonard Padilla MRN: [id number] Date of Birth: 1946/04/16 Referring Provider (PT): Dr Sunnie Nielsen   Encounter Date: 05/20/2020   PT End of Session - 05/20/20 1240    Visit Number 1    Number of Visits 12    Date for PT Re-Evaluation 07/01/20    PT Start Time 1135    PT Stop Time 1228    PT Time Calculation (min) 53 min    Activity Tolerance Patient tolerated treatment well           Past Medical History:  Diagnosis Date  . Aortic stenosis    moderate AS 12/01/15 echo (Dr. Elberta Fortis)  . Arthritis   . CKD (chronic kidney disease) stage 3, GFR 30-59 ml/min (HCC) 08/04/2016  . CKD stage G3a/A1, GFR 45-59 and albumin creatinine ratio <30 mg/g (HCC) 08/04/2016  . Combined form of age-related cataract, both eyes 08/12/2018  . Coronary artery disease   . Dysthymia 03/18/2017  . 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 Essential hypertension 10/13/2015  . Flu 09/2015   influenza A  . Former smoker, stopped smoking in distant past   . GERD (gastroesophageal reflux disease)    occ  . Glaucoma suspect of both eyes 08/12/2018  . Heart murmur   . History of colon polyps   . Hyperlipidemia   . Hypertensive retinopathy of both eyes 08/12/2018  . 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 Pneumonia 09/2015  . Posterior vitreous detachment, left eye 08/12/2018  . Postural dizziness with presyncope 08/27/2016  . Psoriasis   . Sleep apnea    to pick cpap tomorrow 12/13/15  . Stroke (HCC) 09/2015  . Symptomatic carotid artery stenosis 12/15/2015  . 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 Wears glasses     Past Surgical History:  Procedure Laterality Date  . AORTIC VALVE REPLACEMENT N/A 09/09/2017   Procedure: AORTIC VALVE REPLACEMENT (AVR) using a 23  Edwards Perimount Magna Ease Aortic Valve;  Surgeon: Delight Ovens, MD;  Location: [facility] OR;  Service: Open Heart Surgery;  Laterality: N/A;  . COLONOSCOPY W/ BIOPSIES AND POLYPECTOMY    . CORONARY ARTERY BYPASS GRAFT N/A 09/09/2017   Procedure: CORONARY ARTERY BYPASS GRAFTING (CABG) x 4 using the left internal mammary artery and right greater saphenous vein harvested endoscopically.;  Surgeon: Delight Ovens, MD;  Location: [facility] OR;  Service: Open Heart Surgery;  Laterality: N/A;  . ENDARTERECTOMY Right 12/15/2015   Procedure: RIGHT CAROTID ENDARTERECTOMY WITH PATCH ANGIOPLASTY;  Surgeon: Nada Libman, MD;  Location: [facility] OR;  Service: Vascular;  Laterality: Right;  . PATCH ANGIOPLASTY Right 12/15/2015   Procedure: RIGHT CAROTID PATCH ANGIOPLASTY;  Surgeon: Nada Libman, MD;  Location: [facility] OR;  Service: Vascular;  Laterality: Right;  . RIGHT/LEFT HEART CATH AND CORONARY ANGIOGRAPHY N/A 08/23/2017   Procedure: RIGHT/LEFT HEART CATH AND CORONARY ANGIOGRAPHY;  Surgeon: Swaziland, Peter M, MD;  Location: [facility] INVASIVE CV LAB;  Service: Cardiovascular;  Laterality: N/A;  . TEE WITHOUT CARDIOVERSION N/A 09/09/2017   Procedure: TRANSESOPHAGEAL ECHOCARDIOGRAM (TEE);  Surgeon: Delight Ovens, MD;  Location: [facility] OR;  Service: Open Heart Surgery;  Laterality: N/A;  . WISDOM TOOTH EXTRACTION      There were no vitals filed for this visit.    Subjective Assessment - 05/20/20 1132  Subjective Patient reports that he was putting a heavy package down and felt some pain in the Lt hip and pain in the Lt calf. Patient had to sleep in recliner in for a week. He continues to have pain in the Lt hip and some pain in the Lt calf.    Pertinent History denies any musculoskeletal problems; CABG sx for CAD and valve replacement 3 yrs ago; carotid artery surgery 4 yrs ago    Patient Stated Goals get rid of the hip pain    Currently in Pain? Yes    Pain Score 0-No pain   7/10 when twisting   Pain Location Hip    Pain  Orientation Left    Pain Descriptors / Indicators Sharp    Pain Type Acute pain    Pain Radiating Towards calf at night    Pain Onset More than a month ago    Pain Frequency Intermittent    Aggravating Factors  twisting movements    Pain Relieving Factors avoiding twisting movements; exercises              [facility] PT Assessment - 05/20/20 0001      Assessment   Medical Diagnosis Lt hip and calf pain     Referring Provider (PT) Dr Sunnie NielsenNatalie Alexander    Onset Date/Surgical Date 03/25/20    Hand Dominance Right    Next MD Visit PRN     Prior Therapy yrs ago for sciatica       Precautions   Precautions None      Restrictions   Weight Bearing Restrictions No      Balance Screen   Has the patient fallen in the past 6 months No    Has the patient had a decrease in activity level because of a fear of falling?  No    Is the patient reluctant to leave their home because of a fear of falling?  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     Leisure yard work; pool in backyard; walking 30 min/day prior to hip pain       Observation/Other Assessments   Focus on Therapeutic Outcomes (FOTO)  51% limitation      Sensation   Additional Comments WFL's per pt report       Posture/Postural Control   Posture Comments head forward; shoulders rounded and elevated; decreased lumbar lordosis; increased thoracic kyphosis       AROM   Right/Left Hip --   WFL's tight Lt hip rotation    Right/Left Knee --   WNL's bilat    Right/Left Ankle --   tight ankle DF Lt > Rt    Lumbar Flexion 80% pulling    Lumbar Extension 25% discomfort Lt LB and Lt posterior hip     Lumbar - Right Side Bend 80%    Lumbar - Left Side Bend 80%    Lumbar - Right Rotation 45%     Lumbar - Left Rotation 45%      Strength   Right/Left Hip --   WFL's bilat    Right/Left Knee --   WFL's bilat    Right/Left Ankle --   WFL's bilat      Flexibility  Hamstrings tight Lt > Rt     Quadriceps WFL's bilat    ITB tight Lt > Rt     Piriformis tight Lt >> Rt       Palpation   Spinal mobility hypomobile through the lumbar spine with PA mobs     Palpation comment muscular tightness through the Lt posterior hip - piriformis; glut med; glut min       Special Tests   Other special tests (-) SLR; slump test       Ambulation/Gait   Gait Comments antalgic gait with limp Lt LE with wt bearing Lt                       Objective measurements completed on examination: See above findings.       [facility] Adult PT Treatment/Exercise - 05/20/20 0001      Self-Care   Self-Care Other Self-Care Comments    Other Self-Care Comments  discussed modifications for sitting avoiding rounded posture in recliner       Therapeutic Activites    Therapeutic Activities Other Therapeutic Activities    Other Therapeutic Activities myofacial ball release work standing       Knee/Hip Exercises: Stretches   Piriformis Stretch Left;2 reps;30 seconds    Piriformis Stretch Limitations diagonal knee to chest 30 sec x 2 reps     Gastroc Stretch Left;2 reps;30 seconds    Gastroc Stretch Limitations standing     Soleus Stretch Left;2 reps;30 seconds    Soleus Stretch Limitations standing      Moist Heat Therapy   Number Minutes Moist Heat 10 Minutes    Moist Heat Location Hip   posterior Lt hip      Electrical Stimulation   Electrical Stimulation Location posterior Lt hip     Electrical Stimulation Action TENS     Electrical Stimulation Parameters to tolerance    Electrical Stimulation Goals Pain;Tone                  PT Education - 05/20/20 1222    Education Details HEP POC TENS DN    Person(s) Educated Patient    Methods Explanation;Demonstration;Tactile cues;Verbal  cues;Handout    Comprehension Verbalized understanding;Returned demonstration;Verbal cues required;Tactile cues required               PT Long Term Goals - 05/20/20 1250      PT LONG TERM GOAL #1   Title Decrease pain Lt posterior hip and calf by 75-100% allowing patient to return to normal functional activities    Time 6    Period Weeks    Status New    Target Date 07/01/20      PT LONG TERM GOAL #2   Title Increased trunk and Lt hip mobility to WFL's and pain free    Time 6    Period Weeks    Status New    Target Date 07/01/20      PT LONG TERM GOAL #3   Title Patient to demonstrate proper body mechanics for transitional movements and lifting prior to d/c    Time 6    Period Weeks    Status New    Target Date 07/01/20      PT LONG TERM GOAL #4   Title Patient ambulates without limp and returns to walking program 20-30 min 3-5 days/wk    Time 6    Period Weeks    Status New    Target Date  07/01/20      PT LONG TERM GOAL #5   Title Independent in HEP    Time 6    Period Weeks    Status New    Target Date 07/01/20      PT LONG TERM GOAL #6   Title Improve FOTO to </= 38% limitation    Time 6    Period Weeks    Status New    Target Date 07/01/20                  Plan - 05/20/20 1243    Clinical Impression Statement Patient presents with ~ 2 month history of posterior Lt hip and Lt calf pain following a lifting injury 03/25/20. He has limited trunk and LE mobility/ROM; muscular tightness to palpation Lt posterior hip; pain in the Lt calf with walking; antalgic gait pattern with limp Lt LE during wt bearing Lt LE; decreased functional activities due to pain. Patient will benefit from PT to address problems identified.    Stability/Clinical Decision Making Stable/Uncomplicated    Clinical Decision Making Low    Rehab Potential Good    PT Frequency 2x / week    PT Duration 6 weeks    PT Treatment/Interventions ADLs/Self Care Home Management;Aquatic  Therapy;Cryotherapy;Electrical Stimulation;Iontophoresis 4mg /ml Dexamethasone;Moist Heat;Ultrasound;Gait training;Stair training;Functional mobility training;Therapeutic activities;Therapeutic exercise;Balance training;Neuromuscular re-education;Patient/family education;Manual techniques;Passive range of motion;Dry needling;Taping    PT Next Visit Plan assess response to initial exercises and myofacial release work; trial of DN/manual techniques next visit; progress with stretching and strengthening; modalities as indicated    PT Home Exercise Plan [email or web address]    Consulted and Agree with Plan of Care Patient           Patient will benefit from skilled therapeutic intervention in order to improve the following deficits and impairments:  Abnormal gait, Increased fascial restricitons, Increased muscle spasms, Pain, Hypomobility, Impaired flexibility, Improper body mechanics, Decreased mobility, Impaired sensation, Postural dysfunction  Visit Diagnosis: Piriformis syndrome, left - Plan: PT plan of care cert/re-cert  Pain of left calf - Plan: PT plan of care cert/re-cert  Other symptoms and signs involving the musculoskeletal system - Plan: PT plan of care cert/re-cert  Other abnormalities of gait and mobility - Plan: PT plan of care cert/re-cert     Problem List Patient Active Problem List   Diagnosis Date Noted  . CAD (coronary artery disease) 10/21/2019  . Allergic conjunctivitis of left eye 10/16/2019  . History of colonic polyps 03/02/2019  . Family history of prostate cancer in father 03/02/2019  . SBE (subacute bacterial endocarditis) prophylaxis candidate 10/06/2018  . Glaucoma suspect of both eyes 08/12/2018  . Combined form of age-related cataract, both eyes 08/12/2018  . Hypertensive retinopathy of both eyes 08/12/2018  . Posterior vitreous detachment, left eye 08/12/2018  . Colon cancer screening declined 05/27/2018  . 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    . Seasonal allergic rhinitis 11/08/2017  . Bilateral leg edema 09/24/2017  . Urinary retention due to benign prostatic hyperplasia 09/13/2017  . S/P AVR 09/09/2017  . Atherosclerosis 08/19/2017  . Diastolic dysfunction without heart failure 08/19/2017  . 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 Lower urinary tract symptoms (LUTS) 04/29/2017  . Former smoker, stopped smoking in distant past   . Dysthymia 03/18/2017  . Irritability and anger 03/18/2017  . 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 Dyslipidemia, goal LDL below 70 03/18/2017  . 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 Postural dizziness with presyncope 08/27/2016  .  CKD (chronic kidney disease) stage 3, GFR 30-59 ml/min (HCC) 08/04/2016  . Status post CVA 08/03/2016  . Status post carotid endarterectomy 12/15/2015  . Obstructive sleep apnea 11/29/2015  . GERD (gastroesophageal reflux disease) 10/13/2015  . Hypertension goal BP (blood pressure) < 140/90 10/13/2015  . Psoriasis 12/02/2014    Keyen Marban Rober Minion PT, MPH  05/20/2020, 12:56 PM  [facility] [address] [place], [place], [address] Phone: [phone]   Fax:  [phone]  Name: Leonard Padilla MRN: [id number] Date of Birth: 04-10-1946

## 2020-05-27 ENCOUNTER — Ambulatory Visit (INDEPENDENT_AMBULATORY_CARE_PROVIDER_SITE_OTHER): Payer: Medicare HMO | Admitting: Rehabilitative and Restorative Service Providers"

## 2020-05-27 ENCOUNTER — Encounter: Payer: Self-pay | Admitting: Rehabilitative and Restorative Service Providers"

## 2020-05-27 ENCOUNTER — Other Ambulatory Visit: Payer: Self-pay

## 2020-05-27 DIAGNOSIS — M79662 Pain in left lower leg: Secondary | ICD-10-CM

## 2020-05-27 DIAGNOSIS — R2689 Other abnormalities of gait and mobility: Secondary | ICD-10-CM

## 2020-05-27 DIAGNOSIS — G5702 Lesion of sciatic nerve, left lower limb: Secondary | ICD-10-CM

## 2020-05-27 DIAGNOSIS — R29898 Other symptoms and signs involving the musculoskeletal system: Secondary | ICD-10-CM

## 2020-05-27 NOTE — Patient Instructions (Addendum)
Access Code: [email or web address]: [email or web address]: 10/22/2021Prepared by: Moranda Billiot HoltExercises  Supine Piriformis Stretch with Leg Straight - 2 x daily - 7 x weekly - 1 sets - 3 reps - 30 sec hold  Supine Single Knee to Chest - 2 x daily - 7 x weekly - 1 sets - 3 reps - 30 sec hold  Gastroc Stretch on Wall - 2 x daily - 7 x weekly - 1 sets - 3 reps - 30 sec hold  Soleus Stretch on Wall - 2 x daily - 7 x weekly - 1 sets - 3 reps - 30 sec hold  Hooklying Isometric Clamshell - 2 x daily - 7 x weekly - 1 sets - 10 reps - 3 sec hold  Bridge - 2 x daily - 7 x weekly - 1-2 sets - 10 reps - 5 sec hold  Wall Quarter Squat - 2 x daily - 7 x weekly - 1-2 sets - 10 reps - 5-10 sec hold  Trigger Point Dry Needling  . What is Trigger Point Dry Needling (DN)? o DN is a physical therapy technique used to treat muscle pain and dysfunction. Specifically, DN helps deactivate muscle trigger points (muscle knots).  o A thin filiform needle is used to penetrate the skin and stimulate the underlying trigger point. The goal is for a local twitch response (LTR) to occur and for the trigger point to relax. No medication of any kind is injected during the procedure.   . What Does Trigger Point Dry Needling Feel Like?  o The procedure feels different for each individual patient. Some patients report that they do not actually feel the needle enter the skin and overall the process is not painful. Very mild bleeding may occur. However, many patients feel a deep cramping in the muscle in which the needle was inserted. This is the local twitch response.   Marland Kitchen How Will I feel after the treatment? o Soreness is normal, and the onset of soreness may not occur for a few hours. Typically this soreness does not last longer than two days.  o Bruising is uncommon, however; ice can be used to decrease any possible bruising.  o In rare cases feeling tired or nauseous after the treatment is normal. In addition, your symptoms  may get worse before they get better, this period will typically not last longer than 24 hours.   . What Can I do After My Treatment? o Increase your hydration by drinking more water for the next 24 hours. o You may place ice or heat on the areas treated that have become sore, however, do not use heat on inflamed or bruised areas. Heat often brings more relief post needling. o You can continue your regular activities, but vigorous activity is not recommended initially after the treatment for 24 hours. o DN is best combined with other physical therapy such as strengthening, stretching, and other therapies.   ' ' '

## 2020-05-27 NOTE — Therapy (Signed)
[facility] [address] [place], [place], [address] Phone: [phone]   Fax:  [phone]  Physical Therapy Treatment  Patient Details  Name: Leonard Padilla MRN: [id number] Date of Birth: 10-24-1945 Referring Provider (PT): Dr Sunnie Nielsen   Encounter Date: 05/27/2020   PT End of Session - 05/27/20 1306    Visit Number 2    Number of Visits 12    Date for PT Re-Evaluation 07/01/20    PT Start Time 1405    PT Stop Time 1453    PT Time Calculation (min) 48 min    Activity Tolerance Patient tolerated treatment well           Past Medical History:  Diagnosis Date  . Aortic stenosis    moderate AS 12/01/15 echo (Dr. Elberta Fortis)  . Arthritis   . CKD (chronic kidney disease) stage 3, GFR 30-59 ml/min (HCC) 08/04/2016  . CKD stage G3a/A1, GFR 45-59 and albumin creatinine ratio <30 mg/g (HCC) 08/04/2016  . Combined form of age-related cataract, both eyes 08/12/2018  . Coronary artery disease   . Dysthymia 03/18/2017  . 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 Essential hypertension 10/13/2015  . Flu 09/2015   influenza A  . Former smoker, stopped smoking in distant past   . GERD (gastroesophageal reflux disease)    occ  . Glaucoma suspect of both eyes 08/12/2018  . Heart murmur   . History of colon polyps   . Hyperlipidemia   . Hypertensive retinopathy of both eyes 08/12/2018  . 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 Pneumonia 09/2015  . Posterior vitreous detachment, left eye 08/12/2018  . Postural dizziness with presyncope 08/27/2016  . Psoriasis   . Sleep apnea    to pick cpap tomorrow 12/13/15  . Stroke (HCC) 09/2015  . Symptomatic carotid artery stenosis 12/15/2015  . 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 Wears glasses     Past Surgical History:  Procedure Laterality Date  . AORTIC VALVE REPLACEMENT N/A 09/09/2017   Procedure: AORTIC VALVE REPLACEMENT (AVR) using a 23  Edwards Perimount Magna Ease Aortic Valve;  Surgeon: Delight Ovens, MD;  Location: [facility] OR;  Service: Open Heart Surgery;  Laterality: N/A;  . COLONOSCOPY W/ BIOPSIES AND POLYPECTOMY    . CORONARY ARTERY BYPASS GRAFT N/A 09/09/2017   Procedure: CORONARY ARTERY BYPASS GRAFTING (CABG) x 4 using the left internal mammary artery and right greater saphenous vein harvested endoscopically.;  Surgeon: Delight Ovens, MD;  Location[facility] OR;  Service: Open Heart Surgery;  Laterality: N/A;  . ENDARTERECTOMY Right 12/15/2015   Procedure: RIGHT CAROTID ENDARTERECTOMY WITH PATCH ANGIOPLASTY;  Surgeon: Nada Libman, MD;  Location: [facility] OR;  Service: Vascular;  Laterality: Right;  . PATCH ANGIOPLASTY Right 12/15/2015   Procedure: RIGHT CAROTID PATCH ANGIOPLASTY;  Surgeon: Nada Libman, MD;  Location: [facility] OR;  Service: Vascular;  Laterality: Right;  . RIGHT/LEFT HEART CATH AND CORONARY ANGIOGRAPHY N/A 08/23/2017   Procedure: RIGHT/LEFT HEART CATH AND CORONARY ANGIOGRAPHY;  Surgeon: Swaziland, Peter M, MD;  Location: [facility] INVASIVE CV LAB;  Service: Cardiovascular;  Laterality: N/A;  . TEE WITHOUT CARDIOVERSION N/A 09/09/2017   Procedure: TRANSESOPHAGEAL ECHOCARDIOGRAM (TEE);  Surgeon: Delight Ovens, MD;  Location: [facility] OR;  Service: Open Heart Surgery;  Laterality: N/A;  . WISDOM TOOTH EXTRACTION      There were no vitals filed for this visit.   Subjective Assessment - 05/27/20 1307  Subjective Patient reports that he is feeling better. He has much less pain in the hip and the pain in the calf has resolved. he has some pain in the Lt hip when he overdoes it. Still has some pain after doing the stretches which last for 15-20 min. Found a ball for myofacial release; found TENS unit and has used that 2x/day    Currently in Pain? No/denies    Pain Score 0-No pain    Pain Location Hip    Pain Orientation Left    Pain Descriptors / Indicators Sharp    Pain Type Acute pain    Pain Radiating Towards no longer  having calf pain at night    Pain Onset More than a month ago    Pain Frequency Intermittent              [facility] PT Assessment - 05/27/20 0001      Assessment   Medical Diagnosis Lt hip and calf pain     Referring Provider (PT) Dr Sunnie Nielsen    Onset Date/Surgical Date 03/25/20    Hand Dominance Right    Next MD Visit PRN     Prior Therapy yrs ago for sciatica       Palpation   Palpation comment muscular tightness through the Lt posterior hip - piriformis; glut med; glut min                          [facility] Adult PT Treatment/Exercise - 05/27/20 0001      Knee/Hip Exercises: Stretches   Piriformis Stretch Left;3 reps;30 seconds   supine travell    Piriformis Stretch Limitations diagonal knee to chest 30 sec x 2 reps     Gastroc Stretch Left;2 reps;30 seconds    Gastroc Stretch Limitations standing     Soleus Stretch Left;2 reps;30 seconds    Soleus Stretch Limitations standing      Knee/Hip Exercises: Aerobic   Nustep L5 x 5 min       Knee/Hip Exercises: Standing   Wall Squat 5 reps;10 seconds    Wall Squat Limitations small range - some pain       Knee/Hip Exercises: Supine   Bridges Strengthening;Both;10 reps   5 sec hold    Other Supine Knee/Hip Exercises hip abduction - alternating LE green TB 3 sec hold x 10 reps       Moist Heat Therapy   Number Minutes Moist Heat 10 Minutes    Moist Heat Location Hip   posterior Lt hip      Manual Therapy   Manual therapy comments skilled palpation to assess soft tissue response to DN and manual work     Joint Mobilization PA mob Lt hip pt in prone     Soft tissue mobilization deep tissue work through the posterior Lt hip - piriformis and gluts     Myofascial Release posterior Lt hip     Passive ROM IR/ER Lt hip hip extended, knee flexed             Trigger Point Dry Needling - 05/27/20 0001    Consent Given? Yes    Education Handout Provided Yes    Other Dry Needling Lt     Gluteus Medius  Response Palpable increased muscle length    Gluteus Maximus Response Palpable increased muscle length    Piriformis Response Palpable increased muscle length  PT Education - 05/27/20 1337    Education Details HEP DN    Person(s) Educated Patient    Methods Explanation;Demonstration;Tactile cues;Verbal cues;Handout    Comprehension Verbalized understanding;Returned demonstration;Verbal cues required;Tactile cues required               PT Long Term Goals - 05/20/20 1250      PT LONG TERM GOAL #1   Title Decrease pain Lt posterior hip and calf by 75-100% allowing patient to return to normal functional activities    Time 6    Period Weeks    Status New    Target Date 07/01/20      PT LONG TERM GOAL #2   Title Increased trunk and Lt hip mobility to WFL's and pain free    Time 6    Period Weeks    Status New    Target Date 07/01/20      PT LONG TERM GOAL #3   Title Patient to demonstrate proper body mechanics for transitional movements and lifting prior to d/c    Time 6    Period Weeks    Status New    Target Date 07/01/20      PT LONG TERM GOAL #4   Title Patient ambulates without limp and returns to walking program 20-30 min 3-5 days/wk    Time 6    Period Weeks    Status New    Target Date 07/01/20      PT LONG TERM GOAL #5   Title Independent in HEP    Time 6    Period Weeks    Status New    Target Date 07/01/20      PT LONG TERM GOAL #6   Title Improve FOTO to </= 38% limitation    Time 6    Period Weeks    Status New    Target Date 07/01/20                 Plan - 05/27/20 1315    Clinical Impression Statement Patient reports good improvement in Lt hip pain and resolution of Lt calf pain. He has been doing exercises consistently. Added strengthening exercises for hips - some pain with wall squat - will add gradually. Trial of DN tolerated well through the piriformis. Good response to initial treatment and f/u today.     Rehab Potential Good    PT Frequency 2x / week    PT Duration 6 weeks    PT Treatment/Interventions ADLs/Self Care Home Management;Aquatic Therapy;Cryotherapy;Electrical Stimulation;Iontophoresis 4mg /ml Dexamethasone;Moist Heat;Ultrasound;Gait training;Stair training;Functional mobility training;Therapeutic activities;Therapeutic exercise;Balance training;Neuromuscular re-education;Patient/family education;Manual techniques;Passive range of motion;Dry needling;Taping    PT Next Visit Plan assess trial of DN/manual techniques; progress with stretching and strengthening; modalities as indicated - work on squat and lifting to improve core strength and stability    PT Home Exercise Plan [email or web address]    Consulted and Agree with Plan of Care Patient           Patient will benefit from skilled therapeutic intervention in order to improve the following deficits and impairments:     Visit Diagnosis: Piriformis syndrome, left  Pain of left calf  Other symptoms and signs involving the musculoskeletal system  Other abnormalities of gait and mobility     Problem List Patient Active Problem List   Diagnosis Date Noted  . CAD (coronary artery disease) 10/21/2019  . Allergic conjunctivitis of left eye 10/16/2019  . History of colonic polyps 03/02/2019  . Family history  of prostate cancer in father 03/02/2019  . SBE (subacute bacterial endocarditis) prophylaxis candidate 10/06/2018  . Glaucoma suspect of both eyes 08/12/2018  . Combined form of age-related cataract, both eyes 08/12/2018  . Hypertensive retinopathy of both eyes 08/12/2018  . Posterior vitreous detachment, left eye 08/12/2018  . Colon cancer screening declined 05/27/2018  . 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  . Seasonal allergic rhinitis 11/08/2017  . Bilateral leg edema 09/24/2017  . Urinary retention due to benign prostatic hyperplasia 09/13/2017  . S/P AVR 09/09/2017  . Atherosclerosis 08/19/2017  .  Diastolic dysfunction without heart failure 08/19/2017  . 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 Lower urinary tract symptoms (LUTS) 04/29/2017  . Former smoker, stopped smoking in distant past   . Dysthymia 03/18/2017  . Irritability and anger 03/18/2017  . 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 Dyslipidemia, goal LDL below 70 03/18/2017  . 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 Postural dizziness with presyncope 08/27/2016  . CKD (chronic kidney disease) stage 3, GFR 30-59 ml/min (HCC) 08/04/2016  . Status post CVA 08/03/2016  . Status post carotid endarterectomy 12/15/2015  . Obstructive sleep apnea 11/29/2015  . GERD (gastroesophageal reflux disease) 10/13/2015  . Hypertension goal BP (blood pressure) < 140/90 10/13/2015  . Psoriasis 12/02/2014    Keyle Doby Rober MinionP Kelsea Mousel PT, MPH  05/27/2020, 1:52 PM  [facility] [address] [place], [place], [address] Phone: [phone]   Fax:  [phone]  Name: Bertha Stakesaul M Bolls MRN: [id number] Date of Birth: Jul 02, 1946

## 2020-05-31 ENCOUNTER — Encounter: Payer: Self-pay | Admitting: Rehabilitative and Restorative Service Providers"

## 2020-05-31 ENCOUNTER — Other Ambulatory Visit: Payer: Self-pay

## 2020-05-31 ENCOUNTER — Ambulatory Visit (INDEPENDENT_AMBULATORY_CARE_PROVIDER_SITE_OTHER): Payer: Medicare HMO | Admitting: Rehabilitative and Restorative Service Providers"

## 2020-05-31 DIAGNOSIS — M79662 Pain in left lower leg: Secondary | ICD-10-CM | POA: Diagnosis not present

## 2020-05-31 DIAGNOSIS — R2689 Other abnormalities of gait and mobility: Secondary | ICD-10-CM | POA: Diagnosis not present

## 2020-05-31 DIAGNOSIS — G5702 Lesion of sciatic nerve, left lower limb: Secondary | ICD-10-CM

## 2020-05-31 DIAGNOSIS — R29898 Other symptoms and signs involving the musculoskeletal system: Secondary | ICD-10-CM

## 2020-05-31 NOTE — Therapy (Signed)
[facility] [place] [address] [place], [place], [address] Phone: [phone]   Fax:  [phone]  Physical Therapy Treatment  Patient Details  Name: Leonard Padilla MRN: [id number] Date of Birth: 1946-02-01 Referring Provider (PT): Dr Sunnie Nielsen   Encounter Date: 05/31/2020   PT End of Session - 05/31/20 1143    Visit Number 3    Number of Visits 12    Date for PT Re-Evaluation 07/01/20    PT Start Time 1142    PT Stop Time 1230    PT Time Calculation (min) 48 min    Activity Tolerance Patient tolerated treatment well           Past Medical History:  Diagnosis Date  . Aortic stenosis    moderate AS 12/01/15 echo (Dr. Elberta Fortis)  . Arthritis   . CKD (chronic kidney disease) stage 3, GFR 30-59 ml/min (HCC) 08/04/2016  . CKD stage G3a/A1, GFR 45-59 and albumin creatinine ratio <30 mg/g (HCC) 08/04/2016  . Combined form of age-related cataract, both eyes 08/12/2018  . Coronary artery disease   . Dysthymia 03/18/2017  . 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 Essential hypertension 10/13/2015  . Flu 09/2015   influenza A  . Former smoker, stopped smoking in distant past   . GERD (gastroesophageal reflux disease)    occ  . Glaucoma suspect of both eyes 08/12/2018  . Heart murmur   . History of colon polyps   . Hyperlipidemia   . Hypertensive retinopathy of both eyes 08/12/2018  . 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 Pneumonia 09/2015  . Posterior vitreous detachment, left eye 08/12/2018  . Postural dizziness with presyncope 08/27/2016  . Psoriasis   . Sleep apnea    to pick cpap tomorrow 12/13/15  . Stroke (HCC) 09/2015  . Symptomatic carotid artery stenosis 12/15/2015  . 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 Wears glasses     Past Surgical History:  Procedure Laterality Date  . AORTIC VALVE REPLACEMENT N/A 09/09/2017   Procedure: AORTIC VALVE REPLACEMENT (AVR) using a 23  Edwards Perimount Magna Ease Aortic Valve;  Surgeon: Delight Ovens, MD;  Location: [facility] OR;  Service: Open Heart Surgery;  Laterality: N/A;  . COLONOSCOPY W/ BIOPSIES AND POLYPECTOMY    . CORONARY ARTERY BYPASS GRAFT N/A 09/09/2017   Procedure: CORONARY ARTERY BYPASS GRAFTING (CABG) x 4 using the left internal mammary artery and right greater saphenous vein harvested endoscopically.;  Surgeon: Delight Ovens, MD;  Location: [facility] OR;  Service: Open Heart Surgery;  Laterality: N/A;  . ENDARTERECTOMY Right 12/15/2015   Procedure: RIGHT CAROTID ENDARTERECTOMY WITH PATCH ANGIOPLASTY;  Surgeon: Nada Libman, MD;  Location: [facility] OR;  Service: Vascular;  Laterality: Right;  . PATCH ANGIOPLASTY Right 12/15/2015   Procedure: RIGHT CAROTID PATCH ANGIOPLASTY;  Surgeon: Nada Libman, MD;  Location: [facility] OR;  Service: Vascular;  Laterality: Right;  . RIGHT/LEFT HEART CATH AND CORONARY ANGIOGRAPHY N/A 08/23/2017   Procedure: RIGHT/LEFT HEART CATH AND CORONARY ANGIOGRAPHY;  Surgeon: Swaziland, Peter M, MD;  Location: [facility] INVASIVE CV LAB;  Service: Cardiovascular;  Laterality: N/A;  . TEE WITHOUT CARDIOVERSION N/A 09/09/2017   Procedure: TRANSESOPHAGEAL ECHOCARDIOGRAM (TEE);  Surgeon: Delight Ovens, MD;  Location: [facility] OR;  Service: Open Heart Surgery;  Laterality: N/A;  . WISDOM TOOTH EXTRACTION      There were no vitals filed for this visit.   Subjective Assessment - 05/31/20 1144  Subjective Patient reports that he has no calf pain. He has intermittent pain in the Lt hip - sometimes with lifting or goiing up and down stairs. Get the most relief from the ball release work.    Currently in Pain? No/denies    Pain Score 0-No pain    Pain Location Hip              [facility] PT Assessment - 05/31/20 0001      Assessment   Medical Diagnosis Lt hip and calf pain     Referring Provider (PT) Dr Sunnie Nielsen    Onset Date/Surgical Date 03/25/20    Hand Dominance Right    Next MD Visit PRN     Prior  Therapy yrs ago for sciatica       Posture/Postural Control   Posture Comments head forward; shoulders rounded and elevated; decreased lumbar lordosis; increased thoracic kyphosis       AROM   Lumbar Extension 25% discomfort Lt LB and Lt posterior hip    increased ROM w/prone press up but discomfort L post hip      Flexibility   Piriformis tight Lt >> Rt - improving mobilty Lt       Palpation   Spinal mobility hypomobile through the lumbar spine with PA mobs     SI assessment  tenderness to palpation Lt SI area     Palpation comment muscular tightness through the Lt posterior hip - piriformis; glut med; glut min       Ambulation/Gait   Gait Comments no linger exhibits antalgic gait with limp Lt LE with wt bearing Lt - does have continued abnormal gait with minimal to no arm swing Lt; flat lumbar spine; LE's in ER                          [facility] Adult PT Treatment/Exercise - 05/31/20 0001      Neuro Re-ed    Neuro Re-ed Details  working on core stabilization in sitting and standing with education re-infulence of lumbar spine position and core stability on posterior hip and LE symptoms       Knee/Hip Exercises: Stretches   Piriformis Stretch Left;3 reps;30 seconds   supine travell    Piriformis Stretch Limitations diagonal knee to chest 30 sec x 2 reps     Gastroc Stretch Right;Left;2 reps;30 seconds    Gastroc Stretch Limitations slant board     Soleus Stretch Right;Left;2 reps;30 seconds    Soleus Stretch Limitations slant board     Other Knee/Hip Stretches prone press up 2 sec hold x 10 reps to pt's tolerance avoiding pain     Other Knee/Hip Stretches standing trunk extension to tolerance x 2 reps 2 sec hold       Knee/Hip Exercises: Aerobic   Nustep L6 x 5 min       Knee/Hip Exercises: Standing   Functional Squat 10 reps;3 seconds   hinged hips core tight      Knee/Hip Exercises: Seated   Sit to Sand 10 reps;without UE support   working from ~ 24 in surface  hinged hips core tigh      Manual Therapy   Joint Mobilization PA lumbar spine mobs Grade II/III     Soft tissue mobilization deep tissue work through the posterior Lt hip - piriformis and gluts     Passive ROM IR/ER Lt hip hip extended, knee flexed  PT Education - 05/31/20 1227    Education Details HEP posture and body mechanics    Person(s) Educated Patient    Methods Explanation;Demonstration;Tactile cues;Verbal cues;Handout    Comprehension Verbalized understanding;Returned demonstration;Verbal cues required;Tactile cues required               PT Long Term Goals - 05/20/20 1250      PT LONG TERM GOAL #1   Title Decrease pain Lt posterior hip and calf by 75-100% allowing patient to return to normal functional activities    Time 6    Period Weeks    Status New    Target Date 07/01/20      PT LONG TERM GOAL #2   Title Increased trunk and Lt hip mobility to WFL's and pain free    Time 6    Period Weeks    Status New    Target Date 07/01/20      PT LONG TERM GOAL #3   Title Patient to demonstrate proper body mechanics for transitional movements and lifting prior to d/c    Time 6    Period Weeks    Status New    Target Date 07/01/20      PT LONG TERM GOAL #4   Title Patient ambulates without limp and returns to walking program 20-30 min 3-5 days/wk    Time 6    Period Weeks    Status New    Target Date 07/01/20      PT LONG TERM GOAL #5   Title Independent in HEP    Time 6    Period Weeks    Status New    Target Date 07/01/20      PT LONG TERM GOAL #6   Title Improve FOTO to </= 38% limitation    Time 6    Period Weeks    Status New    Target Date 07/01/20                 Plan - 05/31/20 1200    Clinical Impression Statement Gradual progress with Lt posterior hip pain. Symptoms are now intermitent and related to activities but pt is not sure which activities other than lifting. Patient does have limited lumbar  extension with some pain into the Lt posterior hip with prone press up. He can decreased Lt posterior hip pain when core is ingaged. Re-evaluation shows poor core stabilization and decreased lumbar extension; pain with end range lumbar extension with prone press up; pain with palpation Lt SI area. Patient has difficulty with functional activities maintaining good posture with core engaged. He will benefit from continued PT to address problems.    Rehab Potential Good    PT Frequency 2x / week    PT Duration 6 weeks    PT Treatment/Interventions ADLs/Self Care Home Management;Aquatic Therapy;Cryotherapy;Electrical Stimulation;Iontophoresis 4mg /ml Dexamethasone;Moist Heat;Ultrasound;Gait training;Stair training;Functional mobility training;Therapeutic activities;Therapeutic exercise;Balance training;Neuromuscular re-education;Patient/family education;Manual techniques;Passive range of motion;Dry needling;Taping    PT Next Visit Plan continue DN/manual techniques as indicated; progress with stretching and strengthening; modalities as indicated - work on squat, sit to stand, lifting to improve core strength and stability - neuromuscular re-education    PT Home Exercise Plan [email or web address]    Consulted and Agree with Plan of Care Patient           Patient will benefit from skilled therapeutic intervention in order to improve the following deficits and impairments:     Visit Diagnosis: Piriformis syndrome, left  Pain of left calf  Other symptoms and signs involving the musculoskeletal system  Other abnormalities of gait and mobility     Problem List Patient Active Problem List   Diagnosis Date Noted  . CAD (coronary artery disease) 10/21/2019  . Allergic conjunctivitis of left eye 10/16/2019  . History of colonic polyps 03/02/2019  . Family history of prostate cancer in father 03/02/2019  . SBE (subacute bacterial endocarditis) prophylaxis candidate 10/06/2018  . Glaucoma suspect of both eyes  08/12/2018  . Combined form of age-related cataract, both eyes 08/12/2018  . Hypertensive retinopathy of both eyes 08/12/2018  . Posterior vitreous detachment, left eye 08/12/2018  . Colon cancer screening declined 05/27/2018  . 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  . Seasonal allergic rhinitis 11/08/2017  . Bilateral leg edema 09/24/2017  . Urinary retention due to benign prostatic hyperplasia 09/13/2017  . S/P AVR 09/09/2017  . Atherosclerosis 08/19/2017  . Diastolic dysfunction without heart failure 08/19/2017  . 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 Lower urinary tract symptoms (LUTS) 04/29/2017  . Former smoker, stopped smoking in distant past   . Dysthymia 03/18/2017  . Irritability and anger 03/18/2017  . 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 Dyslipidemia, goal LDL below 70 03/18/2017  . 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 Postural dizziness with presyncope 08/27/2016  . CKD (chronic kidney disease) stage 3, GFR 30-59 ml/min (HCC) 08/04/2016  . Status post CVA 08/03/2016  . Status post carotid endarterectomy 12/15/2015  . Obstructive sleep apnea 11/29/2015  . GERD (gastroesophageal reflux disease) 10/13/2015  . Hypertension goal BP (blood pressure) < 140/90 10/13/2015  . Psoriasis 12/02/2014    Disa Riedlinger Rober Minion PT, MPH  05/31/2020, 12:46 PM  [facility] [address] [place], [place], [address] Phone: [phone]   Fax:  [phone]  Name: Leonard Padilla MRN: [id number] Date of Birth: Nov 10, 1945

## 2020-05-31 NOTE — Patient Instructions (Signed)
Access Code: [email or web address]: [email or web address]: 10/26/2021Prepared by: Sabrena Gavitt HoltExercises  Supine Piriformis Stretch with Leg Straight - 2 x daily - 7 x weekly - 1 sets - 3 reps - 30 sec hold  Supine Single Knee to Chest - 2 x daily - 7 x weekly - 1 sets - 3 reps - 30 sec hold  Gastroc Stretch on Wall - 2 x daily - 7 x weekly - 1 sets - 3 reps - 30 sec hold  Soleus Stretch on Wall - 2 x daily - 7 x weekly - 1 sets - 3 reps - 30 sec hold  Hooklying Isometric Clamshell - 2 x daily - 7 x weekly - 1 sets - 10 reps - 3 sec hold  Bridge - 2 x daily - 7 x weekly - 1-2 sets - 10 reps - 5 sec hold  Wall Quarter Squat - 2 x daily - 7 x weekly - 1-2 sets - 10 reps - 5-10 sec hold  Prone Press Up - 2 x daily - 7 x weekly - 1 sets - 10 reps - 2-3 sec hold  Mini Squat - 2 x daily - 7 x weekly - 1 sets - 5-10 reps - 5-10 sec hold  Sit to Stand - 2 x daily - 7 x weekly - 1 sets - 10 reps - 3-5 sec hold  Standing Lumbar Extension - 2 x daily - 7 x weekly - 1 sets - 2-3 reps - 2-3 sec hold Patient Education  Hospital doctor

## 2020-06-02 ENCOUNTER — Encounter: Payer: Self-pay | Admitting: Rehabilitative and Restorative Service Providers"

## 2020-06-02 ENCOUNTER — Ambulatory Visit (INDEPENDENT_AMBULATORY_CARE_PROVIDER_SITE_OTHER): Payer: Medicare HMO | Admitting: Rehabilitative and Restorative Service Providers"

## 2020-06-02 ENCOUNTER — Other Ambulatory Visit: Payer: Self-pay

## 2020-06-02 DIAGNOSIS — G5702 Lesion of sciatic nerve, left lower limb: Secondary | ICD-10-CM

## 2020-06-02 DIAGNOSIS — R29898 Other symptoms and signs involving the musculoskeletal system: Secondary | ICD-10-CM

## 2020-06-02 DIAGNOSIS — R2689 Other abnormalities of gait and mobility: Secondary | ICD-10-CM

## 2020-06-02 DIAGNOSIS — M79662 Pain in left lower leg: Secondary | ICD-10-CM

## 2020-06-02 NOTE — Therapy (Signed)
[facility] [place] [address] [place], [place], [address] Phone: [phone]   Fax:  [phone]  Physical Therapy Treatment  Patient Details  Name: Leonard Padilla MRN: [id number] Date of Birth: 02-15-1946 Referring Provider (PT): Dr Sunnie Nielsen   Encounter Date: 06/02/2020   PT End of Session - 06/02/20 1148    Visit Number 4    Number of Visits 12    Date for PT Re-Evaluation 07/01/20    PT Start Time 1145    PT Stop Time 1230    PT Time Calculation (min) 45 min    Activity Tolerance Patient tolerated treatment well           Past Medical History:  Diagnosis Date  . Aortic stenosis    moderate AS 12/01/15 echo (Dr. Elberta Fortis)  . Arthritis   . CKD (chronic kidney disease) stage 3, GFR 30-59 ml/min (HCC) 08/04/2016  . CKD stage G3a/A1, GFR 45-59 and albumin creatinine ratio <30 mg/g (HCC) 08/04/2016  . Combined form of age-related cataract, both eyes 08/12/2018  . Coronary artery disease   . Dysthymia 03/18/2017  . 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 Essential hypertension 10/13/2015  . Flu 09/2015   influenza A  . Former smoker, stopped smoking in distant past   . GERD (gastroesophageal reflux disease)    occ  . Glaucoma suspect of both eyes 08/12/2018  . Heart murmur   . History of colon polyps   . Hyperlipidemia   . Hypertensive retinopathy of both eyes 08/12/2018  . 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 Pneumonia 09/2015  . Posterior vitreous detachment, left eye 08/12/2018  . Postural dizziness with presyncope 08/27/2016  . Psoriasis   . Sleep apnea    to pick cpap tomorrow 12/13/15  . Stroke (HCC) 09/2015  . Symptomatic carotid artery stenosis 12/15/2015  . 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 Wears glasses     Past Surgical History:  Procedure Laterality Date  . AORTIC VALVE REPLACEMENT N/A 09/09/2017   Procedure: AORTIC VALVE REPLACEMENT (AVR) using a 23  Edwards Perimount Magna Ease Aortic Valve;  Surgeon: Delight Ovens, MD;  Location: [facility] OR;  Service: Open Heart Surgery;  Laterality: N/A;  . COLONOSCOPY W/ BIOPSIES AND POLYPECTOMY    . CORONARY ARTERY BYPASS GRAFT N/A 09/09/2017   Procedure: CORONARY ARTERY BYPASS GRAFTING (CABG) x 4 using the left internal mammary artery and right greater saphenous vein harvested endoscopically.;  Surgeon: Delight Ovens, MD;  Location: [facility] OR;  Service: Open Heart Surgery;  Laterality: N/A;  . ENDARTERECTOMY Right 12/15/2015   Procedure: RIGHT CAROTID ENDARTERECTOMY WITH PATCH ANGIOPLASTY;  Surgeon: Nada Libman, MD;  Location: [facility] OR;  Service: Vascular;  Laterality: Right;  . PATCH ANGIOPLASTY Right 12/15/2015   Procedure: RIGHT CAROTID PATCH ANGIOPLASTY;  Surgeon: Nada Libman, MD;  Location: [facility] OR;  Service: Vascular;  Laterality: Right;  . RIGHT/LEFT HEART CATH AND CORONARY ANGIOGRAPHY N/A 08/23/2017   Procedure: RIGHT/LEFT HEART CATH AND CORONARY ANGIOGRAPHY;  Surgeon: Swaziland, Peter M, MD;  Location: [facility] INVASIVE CV LAB;  Service: Cardiovascular;  Laterality: N/A;  . TEE WITHOUT CARDIOVERSION N/A 09/09/2017   Procedure: TRANSESOPHAGEAL ECHOCARDIOGRAM (TEE);  Surgeon: Delight Ovens, MD;  Location: [facility] OR;  Service: Open Heart Surgery;  Laterality: N/A;  . WISDOM TOOTH EXTRACTION      There were no vitals filed for this visit.   Subjective Assessment - 06/02/20 1150  Subjective Really thinks the aching of his spine has helped. Patient reports that he has no calf pain. He has intermittent pain in the Lt hip but less frequently - avoiding lifting - no problem going up and down stairs. Get the most relief from the ball release work. Loves the ball    Currently in Pain? No/denies                             [facility]Ex[facility] - 06/02/20 0001      Therapeutic Activites    Other Therapeutic Activities myofacial ball release work standing(HEP - pt's favorite)        Neuro Re-ed    Neuro Re-ed Details  working on core stabilization in sit to stand and squat with maintaining neutral spine       Knee/Hip Exercises: Stretches   Piriformis Stretch Left;3 reps;30 seconds   supine travell    Piriformis Stretch Limitations diagonal knee to chest 30 sec x 2 reps     Other Knee/Hip Stretches prone press up 2 sec hold x 10 reps to pt's tolerance avoiding pain; repeated after mobs and DN with incresaed extension noted     Other Knee/Hip Stretches standing trunk extension to tolerance x 2 reps 2 sec hold       Knee/Hip Exercises: Aerobic   Nustep L6 x 7 min       Knee/Hip Exercises: Standing   Functional Squat 10 reps;3 seconds   hinged hips core tight - adding lift with 10# KB     Knee/Hip Exercises: Seated   Sit to Sand 10 reps;without UE support;20 reps   from ~ 24 in surface hinged hips core tight added 10# KB x10     Knee/Hip Exercises: Prone   Hip Extension AROM;Strengthening;Right;Left    Hip Extension Limitations trial of 3-4 reps produced pain Lt posterior hip - stopped     Other Prone Exercises prone quad set with toe resting on table x 10 reps 1-2 sec hold       Manual Therapy   Manual therapy comments skilled palpation to assess soft tissue response to DN and manual work     Joint Mobilization PA lumbar prone mobs Grade II/III     Soft tissue mobilization deep tissue work through the posterior Lt hip/SI - piriformis and gluts     Myofascial Release posterior Lt hip     Passive ROM IR/ER Lt hip hip extended, knee flexed             Trigger Point Dry Needling - 06/02/20 0001    Consent Given? Yes    Education Handout Provided Previously provided    Other Dry Needling Lt     Gluteus Minimus Response Palpable increased muscle length    Gluteus Medius Response Palpable increased muscle length    Gluteus Maximus Response Palpable increased muscle length    Erector spinae Response Palpable increased muscle length    Lumbar multifidi Response  Palpable increased muscle length                PT Education - 06/02/20 1227    Education Details HEP    Person(s) Educated Patient    Methods Explanation;Demonstration;Tactile cues;Verbal cues;Handout    Comprehension Verbalized understanding;Returned demonstration;Verbal cues required;Tactile cues required               PT Long Term Goals - 05/20/20 1250  PT LONG TERM GOAL #1   Title Decrease pain Lt posterior hip and calf by 75-100% allowing patient to return to normal functional activities    Time 6    Period Weeks    Status New    Target Date 07/01/20      PT LONG TERM GOAL #2   Title Increased trunk and Lt hip mobility to WFL's and pain free    Time 6    Period Weeks    Status New    Target Date 07/01/20      PT LONG TERM GOAL #3   Title Patient to demonstrate proper body mechanics for transitional movements and lifting prior to d/c    Time 6    Period Weeks    Status New    Target Date 07/01/20      PT LONG TERM GOAL #4   Title Patient ambulates without limp and returns to walking program 20-30 min 3-5 days/wk    Time 6    Period Weeks    Status New    Target Date 07/01/20      PT LONG TERM GOAL #5   Title Independent in HEP    Time 6    Period Weeks    Status New    Target Date 07/01/20      PT LONG TERM GOAL #6   Title Improve FOTO to </= 38% limitation    Time 6    Period Weeks    Status New    Target Date 07/01/20                 Plan - 06/02/20 1149    Clinical Impression Statement Continued improvement in radicular symptoms. Increasing lumbar extension. Working on mobilization and stretches; core stabilization; sit to stand and squat adding weights. Trunk extension continues to increase. He has improved mobility through the Lt hip and piriformis/gluts with decresed palpable tightness. Patient will benefit form continued work on trunk extension and core stailization progressing to work on lifting with proper techniques.  Needs continued work on prone or supine to sit with proper technique.    Rehab Potential Good    PT Frequency 2x / week    PT Duration 6 weeks    PT Treatment/Interventions ADLs/Self Care Home Management;Aquatic Therapy;Cryotherapy;Electrical Stimulation;Iontophoresis 4mg /ml Dexamethasone;Moist Heat;Ultrasound;Gait training;Stair training;Functional mobility training;Therapeutic activities;Therapeutic exercise;Balance training;Neuromuscular re-education;Patient/family education;Manual techniques;Passive range of motion;Dry needling;Taping    PT Next Visit Plan continue DN/manual techniques as indicated; progress with stretching and strengthening; modalities as indicated - work on squat, sit to stand, lifting to improve core strength and stability - neuromuscular re-education    PT Home Exercise Plan [email or web address]           Patient will benefit from skilled therapeutic intervention in order to improve the following deficits and impairments:     Visit Diagnosis: Piriformis syndrome, left  Pain of left calf  Other symptoms and signs involving the musculoskeletal system  Other abnormalities of gait and mobility     Problem List Patient Active Problem List   Diagnosis Date Noted  . CAD (coronary artery disease) 10/21/2019  . Allergic conjunctivitis of left eye 10/16/2019  . History of colonic polyps 03/02/2019  . Family history of prostate cancer in father 03/02/2019  . SBE (subacute bacterial endocarditis) prophylaxis candidate 10/06/2018  . Glaucoma suspect of both eyes 08/12/2018  . Combined form of age-related cataract, both eyes 08/12/2018  . Hypertensive retinopathy of both eyes 08/12/2018  . Posterior vitreous detachment,  left eye 08/12/2018  . Colon cancer screening declined 05/27/2018  . 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  . Seasonal allergic rhinitis 11/08/2017  . Bilateral leg edema 09/24/2017  . Urinary retention due to benign prostatic hyperplasia  09/13/2017  . S/P AVR 09/09/2017  . Atherosclerosis 08/19/2017  . Diastolic dysfunction without heart failure 08/19/2017  . 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 Lower urinary tract symptoms (LUTS) 04/29/2017  . Former smoker, stopped smoking in distant past   . Dysthymia 03/18/2017  . Irritability and anger 03/18/2017  . 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 Dyslipidemia, goal LDL below 70 03/18/2017  . 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 Postural dizziness with presyncope 08/27/2016  . CKD (chronic kidney disease) stage 3, GFR 30-59 ml/min (HCC) 08/04/2016  . Status post CVA 08/03/2016  . Status post carotid endarterectomy 12/15/2015  . Obstructive sleep apnea 11/29/2015  . GERD (gastroesophageal reflux disease) 10/13/2015  . Hypertension goal BP (blood pressure) < 140/90 10/13/2015  . Psoriasis 12/02/2014    Celyn Rober Minion PT, MPH  06/02/2020, 12:51 PM  [facility] [address] [place], [place], [address] Phone: [phone]   Fax:  [phone]  Name: WILLET SCHLEIFER MRN: [id number] Date of Birth: 01/24/1946

## 2020-06-02 NOTE — Patient Instructions (Signed)
Access Code: [email or web address]: [email or web address]: 10/28/2021Prepared by: Veena Sturgess HoltExercises  Supine Piriformis Stretch with Leg Straight - 2 x daily - 7 x weekly - 1 sets - 3 reps - 30 sec hold  Supine Single Knee to Chest - 2 x daily - 7 x weekly - 1 sets - 3 reps - 30 sec hold  Gastroc Stretch on Wall - 2 x daily - 7 x weekly - 1 sets - 3 reps - 30 sec hold  Soleus Stretch on Wall - 2 x daily - 7 x weekly - 1 sets - 3 reps - 30 sec hold  Hooklying Isometric Clamshell - 2 x daily - 7 x weekly - 1 sets - 10 reps - 3 sec hold  Bridge - 2 x daily - 7 x weekly - 1-2 sets - 10 reps - 5 sec hold  Wall Quarter Squat - 2 x daily - 7 x weekly - 1-2 sets - 10 reps - 5-10 sec hold  Prone Press Up - 2 x daily - 7 x weekly - 1 sets - 10 reps - 2-3 sec hold  Mini Squat - 2 x daily - 7 x weekly - 1 sets - 5-10 reps - 5-10 sec hold  Sit to Stand - 2 x daily - 7 x weekly - 1 sets - 10 reps - 3-5 sec hold  Standing Lumbar Extension - 2 x daily - 7 x weekly - 1 sets - 2-3 reps - 2-3 sec hold  Plank on Counter - 2 x daily - 7 x weekly - 1 sets - 3 reps - 30 sec hold  Doorway Pec Stretch at 60 Degrees Abduction - 3 x daily - 7 x weekly - 3 reps - 1 sets  Doorway Pec Stretch at 90 Degrees Abduction - 3 x daily - 7 x weekly - 3 reps - 1 sets - 30 seconds hold  Doorway Pec Stretch at 120 Degrees Abduction - 3 x daily - 7 x weekly - 3 reps - 1 sets - 30 second hold hold

## 2020-06-06 ENCOUNTER — Ambulatory Visit (INDEPENDENT_AMBULATORY_CARE_PROVIDER_SITE_OTHER): Payer: Medicare HMO | Admitting: Physical Therapy

## 2020-06-06 ENCOUNTER — Other Ambulatory Visit: Payer: Self-pay

## 2020-06-06 DIAGNOSIS — M79662 Pain in left lower leg: Secondary | ICD-10-CM

## 2020-06-06 DIAGNOSIS — R2689 Other abnormalities of gait and mobility: Secondary | ICD-10-CM | POA: Diagnosis not present

## 2020-06-06 DIAGNOSIS — R29898 Other symptoms and signs involving the musculoskeletal system: Secondary | ICD-10-CM | POA: Diagnosis not present

## 2020-06-06 DIAGNOSIS — G5702 Lesion of sciatic nerve, left lower limb: Secondary | ICD-10-CM | POA: Diagnosis not present

## 2020-06-06 NOTE — Therapy (Addendum)
[place], [place], [address] Phone: [phone]   Fax:  [phone]  Physical Therapy Treatment and Discharge  Patient Details  Name: ANTOINO WESTHOFF MRN: [id number] Date of Birth: 1945/10/16 Referring Provider (PT): Dr Emeterio Reeve   Encounter Date: 06/06/2020   PT End of Session - 06/06/20 1148    Visit Number 5    Number of Visits 12    Date for PT Re-Evaluation 07/01/20    PT Start Time 3734    PT Stop Time 1233    PT Time Calculation (min) 45 min    Activity Tolerance Patient tolerated treatment well    Behavior During Therapy [facility] for tasks assessed/performed           Past Medical History:  Diagnosis Date  . Aortic stenosis    moderate AS 12/01/15 echo (Dr. Curt Bears)  . Arthritis   . CKD (chronic kidney disease) stage 3, GFR 30-59 ml/min (HCC) 08/04/2016  . CKD stage G3a/A1, GFR 45-59 and albumin creatinine ratio <30 mg/g ([place]) 08/04/2016  . Combined form of age-related cataract, both eyes 08/12/2018  . Coronary artery disease   . Dysthymia 03/18/2017  . 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 Essential hypertension 10/13/2015  . Flu 09/2015   influenza A  . Former smoker, stopped smoking in distant past   . GERD (gastroesophageal reflux disease)    occ  . Glaucoma suspect of both eyes 08/12/2018  . Heart murmur   . History of colon polyps   . Hyperlipidemia   . Hypertensive retinopathy of both eyes 08/12/2018  . 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 Pneumonia 09/2015  . Posterior vitreous detachment, left eye 08/12/2018  . Postural dizziness with presyncope 08/27/2016  . Psoriasis   . Sleep apnea    to pick cpap tomorrow 12/13/15  . Stroke ([place]) 09/2015  . Symptomatic carotid artery stenosis 12/15/2015  . 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 Wears glasses     Past Surgical History:  Procedure Laterality Date  . AORTIC VALVE REPLACEMENT  N/A 09/09/2017   Procedure: AORTIC VALVE REPLACEMENT (AVR) using a 23 Edwards Perimount Magna Ease Aortic Valve;  Surgeon: Grace Isaac, MD;  Location: [place];  Service: Open Heart Surgery;  Laterality: N/A;  . COLONOSCOPY W/ BIOPSIES AND POLYPECTOMY    . CORONARY ARTERY BYPASS GRAFT N/A 09/09/2017   Procedure: CORONARY ARTERY BYPASS GRAFTING (CABG) x 4 using the left internal mammary artery and right greater saphenous vein harvested endoscopically.;  Surgeon: Grace Isaac, MD;  Location: [place];  Service: Open Heart Surgery;  Laterality: N/A;  . ENDARTERECTOMY Right 12/15/2015   Procedure: RIGHT CAROTID ENDARTERECTOMY WITH PATCH ANGIOPLASTY;  Surgeon: Serafina Mitchell, MD;  Location: [place];  Service: Vascular;  Laterality: Right;  . PATCH ANGIOPLASTY Right 12/15/2015   Procedure: RIGHT CAROTID PATCH ANGIOPLASTY;  Surgeon: Serafina Mitchell, MD;  Location: [place];  Service: Vascular;  Laterality: Right;  . RIGHT/LEFT HEART CATH AND CORONARY ANGIOGRAPHY N/A 08/23/2017   Procedure: RIGHT/LEFT HEART CATH AND CORONARY ANGIOGRAPHY;  Surgeon: Martinique, Peter M, MD;  Location: [place] CV LAB;  Service: Cardiovascular;  Laterality: N/A;  . TEE WITHOUT CARDIOVERSION N/A 09/09/2017   Procedure: TRANSESOPHAGEAL ECHOCARDIOGRAM (TEE);  Surgeon: Grace Isaac, MD;  Location: [place];  Service: Open Heart Surgery;  Laterality: N/A;  . WISDOM TOOTH EXTRACTION      There were no vitals  filed for this visit.   Subjective Assessment - 06/06/20 1237    Subjective "I think I'm close to being done".  Pt reports no pain in hip or calf.  Only time he has pain in Lt hip/SI is when he "moves wrong" or presses ball to it against wall.  He has been doing HEP 2x/day.    Patient Stated Goals get rid of the hip pain    Currently in Pain? No/denies    Pain Score 0-No pain              [facility] PT Assessment - 06/06/20 0001      Assessment   Medical Diagnosis Lt hip and calf pain     Referring Provider (PT) Dr  Emeterio Reeve    Onset Date/Surgical Date 03/25/20    Hand Dominance Right    Next MD Visit PRN     Prior Therapy yrs ago for sciatica             [facility] Adult PT Treatment/Exercise - 06/06/20 0001      Knee/Hip Exercises: Stretches   Hip Flexor Stretch Left;2 reps;30 seconds   seated, leg back   Piriformis Stretch Left;2 reps;20 seconds    Piriformis Stretch Limitations 1 rep of modified pigeon pose x 30 sec     Gastroc Stretch Right;Left;1 rep;30 seconds    Other Knee/Hip Stretches prone on elbows x 15 sec x 4 reps; prone press up x 3 reps of 3 sec     Other Knee/Hip Stretches standing trunk ext x 3 sec x 2 reps      Knee/Hip Exercises: Aerobic   Nustep L6: (arms/legs) x 5 min      Knee/Hip Exercises: Seated   Other Seated Knee/Hip Exercises reviewed sit to/from supine and sit to prone position; demo and cues provided. Pt returned demo with cues.     Sit to Sand 1 set;5 reps;without UE support   eccentric lowering, arms forward.low black mat     Shoulder Exercises: Stretch   Other Shoulder Stretches 3 position doorway stretch x 30 sec x 2 reps each      Manual Therapy   Myofascial Release Lt posterior hip to encourage level pelvis (slight Lt sacral torsion)     Passive ROM IR/ER Lt hip hip extended, knee flexed              PT Long Term Goals - 06/06/20 0001      PT LONG TERM GOAL #1   Title Decrease pain Lt posterior hip and calf by 75-100% allowing patient to return to normal functional activities    Time 6    Period Weeks    Status Achieved      PT LONG TERM GOAL #2   Title Increased trunk and Lt hip mobility to WFL's and pain free    Time 6    Period Weeks    Status On-going      PT LONG TERM GOAL #3   Title Patient to demonstrate proper body mechanics for transitional movements and lifting prior to d/c    Time 6    Period Weeks    Status Partially Met   with cues      PT LONG TERM GOAL #4   Title Patient ambulates without limp and returns to  walking program 20-30 min 3-5 days/wk    Time 6    Period Weeks    Status On-going      PT LONG TERM GOAL #5  Title Independent in HEP    Time 6    Period Weeks    Status On-going             Education:  HEP - encouraged pt to perform stretches 2x/day and incorporate walking 3-5 days/wk.  Perform other strengthening exercises 1x/day.      Plan - 06/06/20 1235    Clinical Impression Statement Pt tolerated all exercises well, without any increase in pain.  His Lt hip elevates when performing prone press up.  Trial of Lt hip flexor seated stretch produced minimal stretch.  Pt reporting 90% improvement in symptoms; has met LTG#1.  Pt plans to resume walking program this week and will report tolerance at next visit.    Rehab Potential Good    PT Frequency 2x / week    PT Duration 6 weeks    PT Treatment/Interventions ADLs/Self Care Home Management;Aquatic Therapy;Cryotherapy;Electrical Stimulation;Iontophoresis 75m/ml Dexamethasone;Moist Heat;Ultrasound;Gait training;Stair training;Functional mobility training;Therapeutic activities;Therapeutic exercise;Balance training;Neuromuscular re-education;Patient/family education;Manual techniques;Passive range of motion;Dry needling;Taping    PT Next Visit Plan Assess response to exercises and walking program.  Assess tightness of Lt psoas.     PT Home Exercise Plan [email or web address]           Patient will benefit from skilled therapeutic intervention in order to improve the following deficits and impairments:     Visit Diagnosis: Piriformis syndrome, left  Pain of left calf  Other symptoms and signs involving the musculoskeletal system  Other abnormalities of gait and mobility     Problem List Patient Active Problem List   Diagnosis Date Noted  . CAD (coronary artery disease) 10/21/2019  . Allergic conjunctivitis of left eye 10/16/2019  . History of colonic polyps 03/02/2019  . Family history of prostate cancer in father 03/02/2019   . SBE (subacute bacterial endocarditis) prophylaxis candidate 10/06/2018  . Glaucoma suspect of both eyes 08/12/2018  . Combined form of age-related cataract, both eyes 08/12/2018  . Hypertensive retinopathy of both eyes 08/12/2018  . Posterior vitreous detachment, left eye 08/12/2018  . Colon cancer screening declined 05/27/2018  . 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  . Seasonal allergic rhinitis 11/08/2017  . Bilateral leg edema 09/24/2017  . Urinary retention due to benign prostatic hyperplasia 09/13/2017  . S/P AVR 09/09/2017  . Atherosclerosis 08/19/2017  . Diastolic dysfunction without heart failure 08/19/2017  . E[place]atic [place] 05/07/2017  . Lower urinary tract symptoms (LUTS) 04/29/2017  . Former smoker, stopped smoking in distant past   . Dysthymia 03/18/2017  . Irritability and anger 03/18/2017  . 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 Dyslipidemia, goal LDL below 70 03/18/2017  . 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 Postural dizziness with presyncope 08/27/2016  . CKD (chronic kidney disease) stage 3, GFR 30-59 ml/min (HCC) 08/04/2016  . Status post CVA 08/03/2016  . Status post carotid endarterectomy 12/15/2015  . Obstructive sleep apnea 11/29/2015  . GERD (gastroesophageal reflux disease) 10/13/2015  . Hypertension goal BP (blood pressure) < 140/90 10/13/2015  . Psoriasis 12/02/2014   PHYSICAL THERAPY DISCHARGE SUMMARY  Visits from Start of Care: 5  Current functional level related to goals / functional outcomes: Goals partially met   Remaining deficits: See above   Education / Equipment: HEP Plan: Patient agrees to discharge.  Patient goals were partially met. Patient is being discharged due to not returning since the last visit.  ?????    DIsabelle Course PT  JKerin Perna PTA 06/06/20 1:03 PM  [facility] [place], [place], [address] Phone: [phone]   Fax:  [phone]  Name: RIOT WATERWORTH MRN: [id number] Date of Birth: February 04, 1946

## 2020-06-09 ENCOUNTER — Encounter: Payer: Medicare HMO | Admitting: Physical Therapy

## 2020-06-13 ENCOUNTER — Encounter: Payer: Medicare HMO | Admitting: Rehabilitative and Restorative Service Providers"

## 2020-06-16 ENCOUNTER — Encounter: Payer: Medicare HMO | Admitting: Rehabilitative and Restorative Service Providers"

## 2020-08-16 ENCOUNTER — Encounter: Payer: Self-pay | Admitting: Osteopathic Medicine

## 2020-08-16 ENCOUNTER — Other Ambulatory Visit: Payer: Self-pay | Admitting: Osteopathic Medicine

## 2020-09-12 ENCOUNTER — Other Ambulatory Visit: Payer: Self-pay

## 2020-09-12 DIAGNOSIS — F341 Dysthymic disorder: Secondary | ICD-10-CM

## 2020-09-12 MED ORDER — SERTRALINE HCL 50 MG PO TABS: ORAL_TABLET | ORAL | 1 refills | Status: DC

## 2020-10-05 ENCOUNTER — Telehealth: Payer: Self-pay

## 2020-10-05 MED ORDER — ATORVASTATIN CALCIUM 80 MG PO TABS
80.0000 mg | ORAL_TABLET | Freq: Every day | ORAL | 3 refills | Status: DC
Start: 2020-10-05 — End: 2021-10-25

## 2020-10-05 NOTE — Telephone Encounter (Signed)
[facility] pharmacy requesting med refill for atorvastatin. Rx not listed in active med list. Pls advise, thanks.

## 2020-10-13 DIAGNOSIS — I2510 Atherosclerotic heart disease of native coronary artery without angina pectoris: Secondary | ICD-10-CM | POA: Insufficient documentation

## 2020-10-13 DIAGNOSIS — Z86010 Personal history of colonic polyps: Secondary | ICD-10-CM | POA: Insufficient documentation

## 2020-10-13 DIAGNOSIS — E785 Hyperlipidemia, unspecified: Secondary | ICD-10-CM | POA: Insufficient documentation

## 2020-10-13 DIAGNOSIS — M1990 Unspecified osteoarthritis, unspecified site: Secondary | ICD-10-CM | POA: Insufficient documentation

## 2020-10-13 DIAGNOSIS — Z973 Presence of spectacles and contact lenses: Secondary | ICD-10-CM | POA: Insufficient documentation

## 2020-10-13 DIAGNOSIS — G4730 Sleep apnea, unspecified: Secondary | ICD-10-CM | POA: Insufficient documentation

## 2020-10-13 DIAGNOSIS — R011 Cardiac murmur, unspecified: Secondary | ICD-10-CM | POA: Insufficient documentation

## 2020-10-13 DIAGNOSIS — I350 Nonrheumatic aortic (valve) stenosis: Secondary | ICD-10-CM | POA: Insufficient documentation

## 2020-10-17 ENCOUNTER — Other Ambulatory Visit: Payer: Self-pay

## 2020-10-17 ENCOUNTER — Ambulatory Visit: Payer: Medicare HMO | Admitting: Cardiology

## 2020-10-17 ENCOUNTER — Encounter: Payer: Self-pay | Admitting: Cardiology

## 2020-10-17 VITALS — BP 118/60 | HR 76 | Ht 69.0 in | Wt 191.0 lb

## 2020-10-17 DIAGNOSIS — I10 Essential (primary) hypertension: Secondary | ICD-10-CM

## 2020-10-17 DIAGNOSIS — Z952 Presence of prosthetic heart valve: Secondary | ICD-10-CM | POA: Diagnosis not present

## 2020-10-17 DIAGNOSIS — E785 Hyperlipidemia, unspecified: Secondary | ICD-10-CM

## 2020-10-17 DIAGNOSIS — I2510 Atherosclerotic heart disease of native coronary artery without angina pectoris: Secondary | ICD-10-CM | POA: Diagnosis not present

## 2020-10-17 NOTE — Addendum Note (Signed)
Addended by: Eleonore Chiquito on: 10/17/2020 11:11 AM   Modules accepted: Orders

## 2020-10-17 NOTE — Patient Instructions (Signed)

## 2020-10-17 NOTE — Progress Notes (Signed)
Cardiology Office Note:    Date:  10/17/2020   ID:  Leonard Padilla, DOB 06-28-1946, MRN [id number]  PCP:  Sunnie Nielsen, DO  Cardiologist:  Garwin Brothers, MD   Referring MD: Sunnie Nielsen, DO    ASSESSMENT:    1. Essential hypertension   2. Dyslipidemia, goal LDL below 70   3. Coronary artery disease involving native coronary artery of native heart without angina pectoris   4. S/P AVR    PLAN:    In order of problems listed above:  1. Coronary artery disease: Secondary prevention stressed with the patient.  Importance of compliance with diet medication stressed any vocalized understanding.  He was advised to walk at least half an hour a day 5 days a week and he promises to do so. 2. Essential hypertension: Blood pressure stable and diet was emphasized.  He is not on any blood pressure medications because of hypotension in the past and those issues 3. Mixed dyslipidemia: Diet was emphasized.  He will be back in the next few days for complete blood work including fasting lipids 4. S/p aortic valve replacement: Echocardiogram reviewed at length and discussed with him.  Questions were answered to his satisfaction. 5. Patient will be seen in follow-up appointment in 6 months or earlier if the patient has any concerns    Medication Adjustments/Labs and Tests Ordered: Current medicines are reviewed at length with the patient today.  Concerns regarding medicines are outlined above.  No orders of the defined types were placed in this encounter.  No orders of the defined types were placed in this encounter.    No chief complaint on file.    History of Present Illness:    Leonard Padilla is a 75 y.o. male.  Patient has past medical history of coronary artery disease, post aortic valve surgery for replacement, essential hypertension and dyslipidemia.  He denies any problems at this time and takes care of activities of daily living.  No chest pain orthopnea or PND.  He mentions  to me that he has been a little lax with his exercise in the past 2 months.  At the time of my evaluation, the patient is alert awake oriented and in no distress.  Past Medical History:  Diagnosis Date  . Allergic conjunctivitis of left eye 10/16/2019  . Aortic stenosis    moderate AS 12/01/15 echo (Dr. Elberta Fortis)  . Arthritis   . Atherosclerosis 08/19/2017  . Bilateral leg edema 09/24/2017  . CAD (coronary artery disease) 10/21/2019  . CKD (chronic kidney disease) stage 3, GFR 30-59 ml/min (HCC) 08/04/2016  . CKD stage G3a/A1, GFR 45-59 and albumin creatinine ratio <30 mg/g (HCC) 08/04/2016  . Colon cancer screening declined 05/27/2018  . Combined form of age-related cataract, both eyes 08/12/2018  . Coronary artery disease   . Diastolic dysfunction without heart failure 08/19/2017  . Dyslipidemia, goal LDL below 70 03/18/2017  . Dysthymia 03/18/2017  . 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 Essential hypertension 10/13/2015  . Family history of prostate cancer in father 03/02/2019  . Flu 09/2015   influenza A  . Former smoker, stopped smoking in distant past   . GERD (gastroesophageal reflux disease)    occ  . Glaucoma suspect of both eyes 08/12/2018  . Heart murmur   . History of colon polyps   . History of colonic polyps 03/02/2019  . Hyperlipidemia   . Hypertension goal BP (blood pressure) <  140/90 10/13/2015  . Hypertensive retinopathy of both eyes 08/12/2018  . Irritability and anger 03/18/2017  . Lower urinary tract symptoms (LUTS) 04/29/2017  . Obstructive sleep apnea 11/29/2015   Self-discontinued CPAP. Declines CPAP therapy 11/08/17  . 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 Pneumonia 09/2015  . Posterior vitreous detachment, left eye 08/12/2018  . Postural dizziness with presyncope 08/27/2016  . Psoriasis   . S/P AVR 09/09/2017   Edwards Lifesciences 23 mm, model 3300TFX, serial [id number]    . SBE (subacute bacterial endocarditis) prophylaxis candidate  10/06/2018  . Seasonal allergic rhinitis 11/08/2017  . Sleep apnea    to pick cpap tomorrow 12/13/15  . Status post carotid endarterectomy 12/15/2015   Right sided B/l plaques <50% velocity (09/2016)  . Status post CVA 08/03/2016  . 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  . Stroke (HCC) 09/2015  . Symptomatic carotid artery stenosis 12/15/2015  . Urinary retention due to benign prostatic hyperplasia 09/13/2017  . 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 Wears glasses     Past Surgical History:  Procedure Laterality Date  . AORTIC VALVE REPLACEMENT N/A 09/09/2017   Procedure: AORTIC VALVE REPLACEMENT (AVR) using a 23 Edwards Perimount Magna Ease Aortic Valve;  Surgeon: Delight Ovens, MD;  Location: [facility];  Service: Open Heart Surgery;  Laterality: N/A;  . COLONOSCOPY W/ BIOPSIES AND POLYPECTOMY    . CORONARY ARTERY BYPASS GRAFT N/A 09/09/2017   Procedure: CORONARY ARTERY BYPASS GRAFTING (CABG) x 4 using the left internal mammary artery and right greater saphenous vein harvested endoscopically.;  Surgeon: Delight Ovens, MD;  Location[facility] OR;  Service: Open Heart Surgery;  Laterality: N/A;  . ENDARTERECTOMY Right 12/15/2015   Procedure: RIGHT CAROTID ENDARTERECTOMY WITH PATCH ANGIOPLASTY;  Surgeon: Nada Libman, MD;  Location: [facility] OR;  Service: Vascular;  Laterality: Right;  . PATCH ANGIOPLASTY Right 12/15/2015   Procedure: RIGHT CAROTID PATCH ANGIOPLASTY;  Surgeon: Nada Libman, MD;  Location: [facility];  Service: Vascular;  Laterality: Right;  . RIGHT/LEFT HEART CATH AND CORONARY ANGIOGRAPHY N/A 08/23/2017   Procedure: RIGHT/LEFT HEART CATH AND CORONARY ANGIOGRAPHY;  Surgeon: Swazil[place], Peter M, MD;  [facility]: [facility] INVASIVE CV LAB;  Service: Cardiovascular;  Laterality: N/A;  . TEE WITHOUT CARDIOVERSION N/A 09/09/2017   Procedure: TRANSESOPHAGEAL ECHOCARDIOGRAM (TEE);  Surgeon: Delight Ovens, MD;  Location: [facility] OR;  Service: Open Heart Surgery;  Laterality: N/A;  . WISDOM  TOOTH EXTRACTION      Current Medications: Current Meds  Medication Sig  . alfuzosin (UROXATRAL) 10 MG 24 hr tablet Take 10 mg by mouth daily with breakfast.  . aspirin EC 81 MG tablet Take 1 tablet (81 mg total) by mouth daily.  Marland Kitchen atorvastatin (LIPITOR) 80 MG tablet Take 1 tablet (80 mg total) by mouth daily.  . clobetasol cream (TEMOVATE) 0.05 % Apply 1 application topically as needed (Psoriasis).  . fexofenadine-pseudoephedrine (ALLEGRA-D 24) 180-240 MG 24 hr tablet Take 1 tablet by mouth as needed (allergies).  . fluticasone (FLONASE) 50 MCG/ACT nasal spray Place 2 sprays into both nostrils as needed for allergies or rhinitis.  Marland Kitchen nitroGLYCERIN (NITROLINGUAL) 0.4 MG/SPRAY spray Place 1 spray under the tongue every 5 (five) minutes x 3 doses as needed for chest pain.  Marland Kitchen sertraline (ZOLOFT) 50 MG tablet Take 50 mg by mouth daily.     Allergies:   Viagra [sildenafil citrate] and Metoprolol   Social History   Socioeconomic History  . Marital status: Married    Spouse name:  [name]  . Number of children: 2  . Years of education: 26  . Highest education level: Not on file  Occupational History  . Occupation: Retired    Comment: retired Clinical biochemist, [employer]  Tobacco Use  . Smoking status: Former Smoker    Quit date: 08/07/1983    Years since quitting: 37.2  . Smokeless tobacco: Never Used  Vaping Use  . Vaping Use: Never used  Substance and Sexual Activity  . Alcohol use: Yes    Comment: 1 glass of wine daily   . Drug use: No  . Sexual activity: Not Currently  Other Topics Concern  . Not on file  Social History Narrative   Lives with wife   Caffeine use- coffee 3-4 cups daily   Social Determinants of Health   Financial Resource Strain: Not on file  Food Insecurity: Not on file  Transportation Needs: Not on file  Physical Activity: Not on file  Stress: Not on file  Social Connections: Not on file     Family History: The patient's family history includes Cancer in his  father and mother.  ROS:   Please see the history of present illness.    All other systems reviewed and are negative.  EKGs/Labs/Other Studies Reviewed:    The following studies were reviewed today: I discussed my findings with the patient at length including echocardiogram report which was reviewed with him   Recent Labs: 03/03/2020: ALT 28; BUN 26; Creat 1.61; Hemoglobin 13.0; Platelets 210; Potassium 5.0; Sodium 139  Recent Lipid Panel    Component Value Date/Time   CHOL 168 03/03/2020 1030   CHOL 135 04/30/2019 1009   TRIG 179 (H) 03/03/2020 1030   HDL 50 03/03/2020 1030   HDL 55 04/30/2019 1009   CHOLHDL 3.4 03/03/2020 1030   VLDL 23 08/03/2016 1115   LDLCALC 90 03/03/2020 1030    Physical Exam:    VS:  BP 118/60   Pulse 76   Ht 5\' 9"  (1.753 m)   Wt 191 lb (86.6 kg)   SpO2 96%   BMI 28.21 kg/m     Wt Readings from Last 3 Encounters:  10/17/20 191 lb (86.6 kg)  04/27/20 182 lb 1.3 oz (82.6 kg)  04/25/20 183 lb (83 kg)     GEN: Patient is in no acute distress HEENT: Normal NECK: No JVD; No carotid bruits LYMPHATICS: No lymphadenopathy CARDIAC: Hear sounds regular, 2/6 systolic murmur at the apex. RESPIRATORY:  Clear to auscultation without rales, wheezing or rhonchi  ABDOMEN: Soft, non-tender, non-distended MUSCULOSKELETAL:  No edema; No deformity  SKIN: Warm and dry NEUROLOGIC:  Alert and oriented x 3 PSYCHIATRIC:  Normal affect   Signed, 04/27/20, MD  10/17/2020 10:54 AM    [facility]

## 2020-10-19 LAB — CBC WITH DIFFERENTIAL/PLATELET
Basophils Absolute: 0.1 10*3/uL (ref 0.0–0.2)
Basos: 1 %
EOS (ABSOLUTE): 0.6 10*3/uL — ABNORMAL HIGH (ref 0.0–0.4)
Eos: 9 %
Hematocrit: 42.3 % (ref 37.5–51.0)
Hemoglobin: 14.1 g/dL (ref 13.0–17.7)
Immature Grans (Abs): 0 10*3/uL (ref 0.0–0.1)
Immature Granulocytes: 0 %
Lymphocytes Absolute: 1.7 10*3/uL (ref 0.7–3.1)
Lymphs: 26 %
MCH: 29.9 pg (ref 26.6–33.0)
MCHC: 33.3 g/dL (ref 31.5–35.7)
MCV: 90 fL (ref 79–97)
Monocytes Absolute: 0.5 10*3/uL (ref 0.1–0.9)
Monocytes: 8 %
Neutrophils Absolute: 3.6 10*3/uL (ref 1.4–7.0)
Neutrophils: 56 %
Platelets: 229 10*3/uL (ref 150–450)
RBC: 4.71 x10E6/uL (ref 4.14–5.80)
RDW: 12.3 % (ref 11.6–15.4)
WBC: 6.5 10*3/uL (ref 3.4–10.8)

## 2020-10-19 LAB — BASIC METABOLIC PANEL
BUN/Creatinine Ratio: 17 (ref 10–24)
BUN: 27 mg/dL (ref 8–27)
CO2: 20 mmol/L (ref 20–29)
Calcium: 9.6 mg/dL (ref 8.6–10.2)
Chloride: 102 mmol/L (ref 96–106)
Creatinine, Ser: 1.61 mg/dL — ABNORMAL HIGH (ref 0.76–1.27)
Glucose: 100 mg/dL — ABNORMAL HIGH (ref 65–99)
Potassium: 5.1 mmol/L (ref 3.5–5.2)
Sodium: 141 mmol/L (ref 134–144)
eGFR: 44 mL/min/{1.73_m2} — ABNORMAL LOW (ref >59)

## 2020-10-19 LAB — HEPATIC FUNCTION PANEL
ALT: 26 IU/L (ref 0–44)
AST: 27 IU/L (ref 0–40)
Albumin: 4.6 g/dL (ref 3.7–4.7)
Alkaline Phosphatase: 125 IU/L — ABNORMAL HIGH (ref 44–121)
Bilirubin Total: 0.6 mg/dL (ref 0.0–1.2)
Bilirubin, Direct: 0.17 mg/dL (ref 0.00–0.40)
Total Protein: 7.1 g/dL (ref 6.0–8.5)

## 2020-10-19 LAB — LIPID PANEL
Chol/HDL Ratio: 3 ratio (ref 0.0–5.0)
Cholesterol, Total: 154 mg/dL (ref 100–199)
HDL: 52 mg/dL (ref >39)
LDL Chol Calc (NIH): 82 mg/dL (ref 0–99)
Triglycerides: 109 mg/dL (ref 0–149)
VLDL Cholesterol Cal: 20 mg/dL (ref 5–40)

## 2020-10-19 LAB — TSH: TSH: 4.65 u[IU]/mL — ABNORMAL HIGH (ref 0.450–4.500)

## 2021-02-04 ENCOUNTER — Other Ambulatory Visit: Payer: Self-pay | Admitting: Osteopathic Medicine

## 2021-02-10 ENCOUNTER — Other Ambulatory Visit: Payer: Self-pay | Admitting: Osteopathic Medicine

## 2021-02-16 ENCOUNTER — Encounter: Payer: Self-pay | Admitting: Osteopathic Medicine

## 2021-02-16 ENCOUNTER — Ambulatory Visit (INDEPENDENT_AMBULATORY_CARE_PROVIDER_SITE_OTHER): Payer: Medicare HMO | Admitting: Osteopathic Medicine

## 2021-02-16 ENCOUNTER — Other Ambulatory Visit: Payer: Self-pay

## 2021-02-16 VITALS — BP 154/80 | HR 66 | Ht 69.0 in | Wt 184.9 lb

## 2021-02-16 DIAGNOSIS — G4730 Sleep apnea, unspecified: Secondary | ICD-10-CM

## 2021-02-16 DIAGNOSIS — I2510 Atherosclerotic heart disease of native coronary artery without angina pectoris: Secondary | ICD-10-CM | POA: Diagnosis not present

## 2021-02-16 DIAGNOSIS — K219 Gastro-esophageal reflux disease without esophagitis: Secondary | ICD-10-CM

## 2021-02-16 DIAGNOSIS — E785 Hyperlipidemia, unspecified: Secondary | ICD-10-CM

## 2021-02-16 DIAGNOSIS — F341 Dysthymic disorder: Secondary | ICD-10-CM

## 2021-02-16 DIAGNOSIS — Z125 Encounter for screening for malignant neoplasm of prostate: Secondary | ICD-10-CM

## 2021-02-16 DIAGNOSIS — R7989 Other specified abnormal findings of blood chemistry: Secondary | ICD-10-CM

## 2021-02-16 MED ORDER — ALFUZOSIN HCL ER 10 MG PO TB24
ORAL_TABLET | ORAL | 3 refills | Status: DC
Start: 2021-02-16 — End: 2021-05-16

## 2021-02-16 MED ORDER — SERTRALINE HCL 50 MG PO TABS
50.0000 mg | ORAL_TABLET | Freq: Every day | ORAL | 3 refills | Status: DC
Start: 2021-02-16 — End: 2022-03-15

## 2021-02-16 MED ORDER — LINACLOTIDE 72 MCG PO CAPS: 72.0000 ug | ORAL_CAPSULE | Freq: Every day | ORAL | 2 refills | Status: DC

## 2021-02-16 NOTE — Patient Instructions (Signed)
Linzess for constipation  Take every day or as needed  Labs today  Monitor BP at home  Goal <140/90

## 2021-02-16 NOTE — Progress Notes (Signed)
Leonard Padilla is a 75 y.o. male who presents to  [facility] at [facility]  today, 02/16/21, seeking care for the following:  Refill on medications BP check  Constipation, ongoing worse past few months, no pain or bloody stool, just not going as frequently and sometime stool is hard to pass      ASSESSMENT & PLAN with other pertinent findings:  The primary encounter diagnosis was Coronary artery disease involving native coronary artery of native heart without angina pectoris. Diagnoses of Dysthymia, Gastroesophageal reflux disease without esophagitis, Hyperlipidemia, unspecified hyperlipidemia type, Sleep apnea, unspecified type, Prostate cancer screening, and Abnormal TSH were also pertinent to this visit.   BP Readings from Last 3 Encounters:  02/16/21 (!) 154/80  10/17/20 118/60  04/27/20 (!) 84/58    Patient Instructions  Linzess for constipation  Take every day or as needed  Labs today  Monitor BP at home  Goal <140/90   Orders Placed This Encounter  Procedures   CBC   COMPLETE METABOLIC PANEL WITH GFR   Lipid panel   PSA, Total with Reflex to PSA, Free   TSH   T4, free    Meds ordered this encounter  Medications   alfuzosin (UROXATRAL) 10 MG 24 hr tablet    Sig: TAKE 1 TABLET(10 MG) BY MOUTH DAILY WITH BREAKFAST    Dispense:  90 tablet    Refill:  3   sertraline (ZOLOFT) 50 MG tablet    Sig: Take 1 tablet (50 mg total) by mouth daily.    Dispense:  90 tablet    Refill:  3   linaclotide (LINZESS) 72 MCG capsule    Sig: Take 1 capsule (72 mcg total) by mouth daily before breakfast.    Dispense:  30 capsule    Refill:  2     See below for relevant physical exam findings  See below for recent lab and imaging results reviewed  Medications, allergies, PMH, PSH, SocH, FamH reviewed below    Follow-up instructions: Return in about 6 months (around 08/19/2021) for MEDICARE WELLNESS W/  RN.                                        Exam:  BP (!) 154/80   Pulse 66   Ht 5\' 9"  (1.753 m)   Wt 184 lb 14.4 oz (83.9 kg)   SpO2 96%   BMI 27.30 kg/m  Constitutional: VS see above. General Appearance: alert, well-developed, well-nourished, NAD Neck: No masses, trachea midline.  Respiratory: Normal respiratory effort. no wheeze, no rhonchi, no rales Cardiovascular: S1/S2 normal, 2/6 systolic murmur, no rub/gallop auscultated. RRR.  Musculoskeletal: Gait normal. Symmetric and independent movement of all extremities Neurological: Normal balance/coordination. No tremor. Skin: warm, dry, intact.  Psychiatric: Normal judgment/insight. Normal mood and affect. Oriented x3.   Current Meds  Medication Sig   aspirin EC 81 MG tablet Take 1 tablet (81 mg total) by mouth daily.   atorvastatin (LIPITOR) 80 MG tablet Take 1 tablet (80 mg total) by mouth daily.   clobetasol cream (TEMOVATE) 0.05 % Apply 1 application topically as needed (Psoriasis).   fexofenadine-pseudoephedrine (ALLEGRA-D 24) 180-240 MG 24 hr tablet Take 1 tablet by mouth as needed (allergies).   fluticasone (FLONASE) 50 MCG/ACT nasal spray Place 2 sprays into both nostrils as needed for allergies or rhinitis.   linaclotide (LINZESS) 72 MCG  capsule Take 1 capsule (72 mcg total) by mouth daily before breakfast.   nitroGLYCERIN (NITROLINGUAL) 0.4 MG/SPRAY spray Place 1 spray under the tongue every 5 (five) minutes x 3 doses as needed for chest pain.   [DISCONTINUED] alfuzosin (UROXATRAL) 10 MG 24 hr tablet TAKE 1 TABLET(10 MG) BY MOUTH DAILY WITH BREAKFAST   [DISCONTINUED] sertraline (ZOLOFT) 50 MG tablet Take 50 mg by mouth daily.    Allergies  Allergen Reactions   Viagra [Sildenafil Citrate] Other (See Comments)    COLOR DISCRIMINATION [DOSE RELATED] "blue sensation"   Metoprolol Other (See Comments)    Orthostasis, near syncope    Patient Active Problem List   Diagnosis Date Noted    Aortic stenosis    Arthritis    Coronary artery disease    Heart murmur    History of colon polyps    Hyperlipidemia    Sleep apnea    Wears glasses    CAD (coronary artery disease) 10/21/2019   Allergic conjunctivitis of left eye 10/16/2019   History of colonic polyps 03/02/2019   Family history of prostate cancer in father 03/02/2019   SBE (subacute bacterial endocarditis) prophylaxis candidate 10/06/2018   Glaucoma suspect of both eyes 08/12/2018   Combined form of age-related cataract, both eyes 08/12/2018   Hypertensive retinopathy of both eyes 08/12/2018   Posterior vitreous detachment, left eye 08/12/2018   Colon cancer screening declined 05/27/2018   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   Seasonal allergic rhinitis 11/08/2017   Bilateral leg edema 09/24/2017   Urinary retention due to benign prostatic hyperplasia 09/13/2017   S/P AVR 09/09/2017   Atherosclerosis 08/19/2017   Diastolic dysfunction without heart failure 08/19/2017   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/07/2017   Lower urinary tract symptoms (LUTS) 04/29/2017   Former smoker, stopped smoking in distant past    Dysthymia 03/18/2017   Irritability and anger 03/18/2017   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   Dyslipidemia, goal LDL below 70 03/18/2017   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   Postural dizziness with presyncope 08/27/2016   CKD (chronic kidney disease) stage 3, GFR 30-59 ml/min (HCC) 08/04/2016   CKD stage G3a/A1, GFR 45-59 and albumin creatinine ratio <30 mg/g (HCC) 08/04/2016   Status post CVA 08/03/2016   Status post carotid endarterectomy 12/15/2015   Symptomatic carotid artery stenosis 12/15/2015   Obstructive sleep apnea 11/29/2015   GERD (gastroesophageal reflux disease) 10/13/2015   Hypertension goal BP (blood pressure) < 140/90 10/13/2015   Essential hypertension 10/13/2015   Flu 09/2015   Pneumonia 09/2015   Stroke (HCC) 09/2015   Psoriasis  12/02/2014    Family History  Problem Relation Age of Onset   Cancer Mother        ovarian cancer   Cancer Father        Prostate    Social History   Tobacco Use  Smoking Status Former   Types: Cigarettes   Quit date: 08/07/1983   Years since quitting: 37.5  Smokeless Tobacco Never    Past Surgical History:  Procedure Laterality Date   AORTIC VALVE REPLACEMENT N/A 09/09/2017   Procedure: AORTIC VALVE REPLACEMENT (AVR) using a 23 Edwards Perimount Magna Ease Aortic Valve;  Surgeon: Delight Ovens, MD;  Location: [facility] OR;  Service: Open Heart Surgery;  Laterality: N/A;   COLONOSCOPY W/ BIOPSIES AND POLYPECTOMY     CORONARY ARTERY BYPASS GRAFT N/A 09/09/2017   Procedure: CORONARY ARTERY BYPASS GRAFTING (CABG) x 4 using the left internal mammary artery and  right greater saphenous vein harvested endoscopically.;  Surgeon: Delight Ovens, MD;  Location: [facility] OR;  Service: Open Heart Surgery;  Laterality: N/A;   ENDARTERECTOMY Right 12/15/2015   Procedure: RIGHT CAROTID ENDARTERECTOMY WITH PATCH ANGIOPLASTY;  Surgeon: Nada Libman, MD;  Location: [facility] OR;  Service: Vascular;  Laterality: Right;   PATCH ANGIOPLASTY Right 12/15/2015   Procedure: RIGHT CAROTID PATCH ANGIOPLASTY;  Surgeon: Nada Libman, MD;  Location: [facility] OR;  Service: Vascular;  Laterality: Right;   RIGHT/LEFT HEART CATH AND CORONARY ANGIOGRAPHY N/A 08/23/2017   Procedure: RIGHT/LEFT HEART CATH AND CORONARY ANGIOGRAPHY;  Surgeon: Swaziland, Peter M, MD;  Location[facility] INVASIVE CV LAB;  Service: Cardiovascular;  Laterality: N/A;   TEE WITHOUT CARDIOVERSION N/A 09/09/2017   Procedure: TRANSESOPHAGEAL ECHOCARDIOGRAM (TEE);  Surgeon: Delight Ovens, MD;  Location: [facility] OR;  Service: Open Heart Surgery;  Laterality: N/A;   WISDOM TOOTH EXTRACTION      Immunization History  Administered Date(s) Administered   Fluad Quad(high Dose 65+) 04/22/2020   Influenza, High Dose Seasonal PF 04/24/2017, 04/03/2018   Influenza,inj,Quad PF,6+  Mos 03/27/2019   Influenza-Unspecified 03/16/2016   PFIZER(Purple Top)SARS-COV-2 Vaccination 08/20/2019, 09/10/2019, 05/02/2020, 11/11/2020   Pneumococcal Conjugate-13 03/18/2017   Pneumococcal Polysaccharide-23 12/16/2015   Tdap 08/06/2012   Zoster, Live 08/06/2010    No results found for this or any previous visit (from the past 2160 hour(s)).  No results found.     All questions at time of visit were answered - patient instructed to contact office with any additional concerns or updates. ER/RTC precautions were reviewed with the patient as applicable.   Please note: manual typing as well as voice recognition software may have been used to produce this document - typos may escape review. Please contact Dr. Lyn Hollingshead for any needed clarifications.

## 2021-02-17 LAB — COMPLETE METABOLIC PANEL WITH GFR
AG Ratio: 1.9 (calc) (ref 1.0–2.5)
ALT: 18 U/L (ref 9–46)
AST: 24 U/L (ref 10–35)
Albumin: 4.3 g/dL (ref 3.6–5.1)
Alkaline phosphatase (APISO): 72 U/L (ref 35–144)
BUN/Creatinine Ratio: 17 (calc) (ref 6–22)
BUN: 23 mg/dL (ref 7–25)
CO2: 21 mmol/L (ref 20–32)
Calcium: 9.2 mg/dL (ref 8.6–10.3)
Chloride: 109 mmol/L (ref 98–110)
Creat: 1.38 mg/dL — ABNORMAL HIGH (ref 0.70–1.28)
Globulin: 2.3 g/dL (calc) (ref 1.9–3.7)
Glucose, Bld: 90 mg/dL (ref 65–99)
Potassium: 4.2 mmol/L (ref 3.5–5.3)
Sodium: 140 mmol/L (ref 135–146)
Total Bilirubin: 0.7 mg/dL (ref 0.2–1.2)
Total Protein: 6.6 g/dL (ref 6.1–8.1)
eGFR: 53 mL/min/{1.73_m2} — ABNORMAL LOW (ref >=60)

## 2021-02-17 LAB — CBC
HCT: 38.1 % — ABNORMAL LOW (ref 38.5–50.0)
Hemoglobin: 12.9 g/dL — ABNORMAL LOW (ref 13.2–17.1)
MCH: 30.9 pg (ref 27.0–33.0)
MCHC: 33.9 g/dL (ref 32.0–36.0)
MCV: 91.1 fL (ref 80.0–100.0)
MPV: 9.9 fL (ref 7.5–12.5)
Platelets: 182 10*3/uL (ref 140–400)
RBC: 4.18 10*6/uL — ABNORMAL LOW (ref 4.20–5.80)
RDW: 12.7 % (ref 11.0–15.0)
WBC: 5.1 10*3/uL (ref 3.8–10.8)

## 2021-02-17 LAB — PSA, TOTAL WITH REFLEX TO PSA, FREE: PSA, Total: 0.6 ng/mL (ref <=4.0)

## 2021-02-17 LAB — T4, FREE: Free T4: 1 ng/dL (ref 0.8–1.8)

## 2021-02-17 LAB — LIPID PANEL
Cholesterol: 142 mg/dL (ref <200)
HDL: 55 mg/dL (ref >=40)
LDL Cholesterol (Calc): 70 mg/dL (calc)
Non-HDL Cholesterol (Calc): 87 mg/dL (calc) (ref <130)
Total CHOL/HDL Ratio: 2.6 (calc) (ref <5.0)
Triglycerides: 83 mg/dL (ref <150)

## 2021-02-17 LAB — TSH: TSH: 3.56 mIU/L (ref 0.40–4.50)

## 2021-03-02 DIAGNOSIS — H2181 Floppy iris syndrome: Secondary | ICD-10-CM

## 2021-03-02 HISTORY — DX: Floppy iris syndrome: H21.81

## 2021-04-04 ENCOUNTER — Other Ambulatory Visit: Payer: Self-pay

## 2021-04-04 ENCOUNTER — Ambulatory Visit (INDEPENDENT_AMBULATORY_CARE_PROVIDER_SITE_OTHER): Payer: Medicare HMO | Admitting: Osteopathic Medicine

## 2021-04-04 DIAGNOSIS — Z23 Encounter for immunization: Secondary | ICD-10-CM

## 2021-04-20 ENCOUNTER — Ambulatory Visit: Payer: Medicare HMO | Admitting: Cardiology

## 2021-04-20 ENCOUNTER — Encounter: Payer: Self-pay | Admitting: Cardiology

## 2021-04-20 ENCOUNTER — Encounter: Payer: Self-pay | Admitting: Osteopathic Medicine

## 2021-04-20 ENCOUNTER — Other Ambulatory Visit: Payer: Self-pay

## 2021-04-20 VITALS — BP 146/80 | HR 75 | Ht 69.0 in | Wt 187.1 lb

## 2021-04-20 DIAGNOSIS — E782 Mixed hyperlipidemia: Secondary | ICD-10-CM | POA: Diagnosis not present

## 2021-04-20 DIAGNOSIS — Z952 Presence of prosthetic heart valve: Secondary | ICD-10-CM

## 2021-04-20 DIAGNOSIS — I10 Essential (primary) hypertension: Secondary | ICD-10-CM

## 2021-04-20 DIAGNOSIS — I2510 Atherosclerotic heart disease of native coronary artery without angina pectoris: Secondary | ICD-10-CM | POA: Diagnosis not present

## 2021-04-20 NOTE — Patient Instructions (Signed)

## 2021-04-20 NOTE — Progress Notes (Signed)
Cardiology Office Note:    Date:  04/20/2021   ID:  Leonard Padilla, DOB 1946/04/03, MRN [id number]  PCP:  Leonard Nielsen, DO  Cardiologist:  Leonard Brothers, MD   Referring MD: Leonard Nielsen, DO    ASSESSMENT:    1. Coronary artery disease involving native coronary artery of native heart without angina pectoris   2. S/P AVR   3. Essential hypertension   4. Mixed hyperlipidemia    PLAN:    In order of problems listed above:  Coronary artery disease: Secondary prevention stressed with the patient.  Importance of compliance with diet medication stressed and he vocalized understanding.  He was advised to walk at least half an hour a day 5 days a week and he promises to do so. Essential hypertension: Blood pressure stable and diet was emphasized.  Lifestyle modification urged. Mixed dyslipidemia: Lipids reviewed and discussed with him.  Lifestyle modification and diet and exercise stressed. Post aortic valve replacement: Stable echocardiogram reviewed with him and is happy about it. Patient will be seen in follow-up appointment in 6 months or earlier if the patient has any concerns    Medication Adjustments/Labs and Tests Ordered: Current medicines are reviewed at length with the patient today.  Concerns regarding medicines are outlined above.  Orders Placed This Encounter  Procedures   EKG 12-Lead   No orders of the defined types were placed in this encounter.    No chief complaint on file.    History of Present Illness:    Leonard Padilla is a 75 y.o. male.  Patient has past medical history of aortic valve stenosis post replacement, essential hypertension, mixed dyslipidemia.  He denies any problems at this time and takes care of activities of daily living.  No chest pain orthopnea or PND.  He leads a sedentary lifestyle.  At the time of my evaluation, the patient is alert awake oriented and in no distress.  Past Medical History:  Diagnosis Date   Allergic  conjunctivitis of left eye 10/16/2019   Aortic stenosis    moderate AS 12/01/15 echo (Dr. Elberta Padilla)   Arthritis    Atherosclerosis 08/19/2017   Bilateral leg edema 09/24/2017   CAD (coronary artery disease) 10/21/2019   CKD (chronic kidney disease) stage 3, GFR 30-59 ml/min (HCC) 08/04/2016   CKD stage G3a/A1, GFR 45-59 and albumin creatinine ratio <30 mg/g (HCC) 08/04/2016   Colon cancer screening declined 05/27/2018   Combined form of age-related cataract, both eyes 08/12/2018   Coronary artery disease    Diastolic dysfunction without heart failure 08/19/2017   Dyslipidemia, goal LDL below 70 03/18/2017   Dysthymia 03/18/2017   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/07/2017   Mild, repeat in 5 years (2023)   Essential hypertension 10/13/2015   Family history of prostate cancer in father 03/02/2019   Flu 09/2015   influenza A   Former smoker, stopped smoking in distant past    GERD (gastroesophageal reflux disease)    occ   Glaucoma suspect of both eyes 08/12/2018   Heart murmur    History of colon polyps    History of colonic polyps 03/02/2019   Hyperlipidemia    Hypertension goal BP (blood pressure) < 140/90 10/13/2015   Hypertensive retinopathy of both eyes 08/12/2018   Intraoperative floppy iris syndrome (IFIS) 03/02/2021   Irritability and anger 03/18/2017   Lower urinary tract symptoms (LUTS) 04/29/2017   Obstructive sleep apnea 11/29/2015   Self-discontinued CPAP. Declines CPAP therapy 11/08/17   PBA (pseudobulbar  affect) 01/08/2017   Pneumonia 09/2015   Posterior vitreous detachment, left eye 08/12/2018   Postural dizziness with presyncope 08/27/2016   Psoriasis    S/P AVR 09/09/2017   Leonard Padilla 23 mm, model 3300TFX, serial #[id number]     SBE (subacute bacterial endocarditis) prophylaxis candidate 10/06/2018   Seasonal allergic rhinitis 11/08/2017   Sleep apnea    to pick cpap tomorrow 12/13/15   Status post carotid endarterectomy 12/15/2015   Right sided B/l plaques <50% velocity (09/2016)   Status  post CVA 08/03/2016   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   Stroke (HCC) 09/2015   Symptomatic carotid artery stenosis 12/15/2015   Urinary retention due to benign prostatic hyperplasia 09/13/2017   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   Wears glasses     Past Surgical History:  Procedure Laterality Date   AORTIC VALVE REPLACEMENT N/A 09/09/2017   Procedure: AORTIC VALVE REPLACEMENT (AVR) using a 23 Leonard Perimount Magna Ease Aortic Valve;  Surgeon: Leonard Ovens, MD;  Location: [facility] OR;  Service: Open Heart Surgery;  Laterality: N/A;   COLONOSCOPY W/ BIOPSIES AND POLYPECTOMY     CORONARY ARTERY BYPASS GRAFT N/A 09/09/2017   Procedure: CORONARY ARTERY BYPASS GRAFTING (CABG) x 4 using the left internal mammary artery and right greater saphenous vein harvested endoscopically.;  Surgeon: Leonard Ovens, MD;  Location: [facility] OR;  Service: Open Heart Surgery;  Laterality: N/A;   ENDARTERECTOMY Right 12/15/2015   Procedure: RIGHT CAROTID ENDARTERECTOMY WITH PATCH ANGIOPLASTY;  Surgeon: Leonard Libman, MD;  Location: [facility] OR;  Service: Vascular;  Laterality: Right;   PATCH ANGIOPLASTY Right 12/15/2015   Procedure: RIGHT CAROTID PATCH ANGIOPLASTY;  Surgeon: Leonard Libman, MD;  Location: [facility] OR;  Service: Vascular;  Laterality: Right;   RIGHT/LEFT HEART CATH AND CORONARY ANGIOGRAPHY N/A 08/23/2017   Procedure: RIGHT/LEFT HEART CATH AND CORONARY ANGIOGRAPHY;  Surgeon: Swaziland, Peter M, MD;  Location: [facility] INVASIVE CV LAB;  Service: Cardiovascular;  Laterality: N/A;   TEE WITHOUT CARDIOVERSION N/A 09/09/2017   Procedure: TRANSESOPHAGEAL ECHOCARDIOGRAM (TEE);  Surgeon: Leonard Ovens, MD;  Location: [facility] OR;  Service: Open Heart Surgery;  Laterality: N/A;   WISDOM TOOTH EXTRACTION      Current Medications: Current Meds  Medication Sig   alfuzosin (UROXATRAL) 10 MG 24 hr tablet TAKE 1 TABLET(10 MG) BY MOUTH DAILY WITH BREAKFAST   aspirin EC 81 MG tablet Take 1 tablet (81 mg  total) by mouth daily.   atorvastatin (LIPITOR) 80 MG tablet Take 1 tablet (80 mg total) by mouth daily.   clobetasol cream (TEMOVATE) 0.05 % Apply 1 application topically as needed (Psoriasis).   fexofenadine-pseudoephedrine (ALLEGRA-D 24) 180-240 MG 24 hr tablet Take 1 tablet by mouth as needed (allergies).   fluticasone (FLONASE) 50 MCG/ACT nasal spray Place 2 sprays into both nostrils as needed for allergies or rhinitis.   linaclotide (LINZESS) 72 MCG capsule Take 1 capsule (72 mcg total) by mouth daily before breakfast.   nitroGLYCERIN (NITROLINGUAL) 0.4 MG/SPRAY spray Place 1 spray under the tongue every 5 (five) minutes x 3 doses as needed for chest pain.   sertraline (ZOLOFT) 50 MG tablet Take 1 tablet (50 mg total) by mouth daily.     Allergies:   Viagra [sildenafil citrate] and Metoprolol   Social History   Socioeconomic History   Marital status: Married    Spouse name: [name]   Number of children: 2   Years of education: 13   Highest education level: Not on  file  Occupational History   Occupation: Retired    Comment: retired Clinical biochemist, [employer]  Tobacco Use   Smoking status: Former    Types: Cigarettes    Quit date: 08/07/1983    Years since quitting: 37.7   Smokeless tobacco: Never  Vaping Use   Vaping Use: Never used  Substance and Sexual Activity   Alcohol use: Yes    Comment: 1 glass of wine daily    Drug use: No   Sexual activity: Not Currently  Other Topics Concern   Not on file  Social History Narrative   Lives with wife   Caffeine use- coffee 3-4 cups daily   Social Determinants of Health   Financial Resource Strain: Not on file  Food Insecurity: Not on file  Transportation Needs: Not on file  Physical Activity: Not on file  Stress: Not on file  Social Connections: Not on file     Family History: The patient's family history includes Cancer in his father and mother.  ROS:   Please see the history of present illness.    All other systems  reviewed and are negative.  EKGs/Labs/Other Studies Reviewed:    The following studies were reviewed today: I discussed my findings with the patient at length.  Echo report from last evaluation was revisited.   Recent Labs: 02/16/2021: ALT 18; BUN 23; Creat 1.38; Hemoglobin 12.9; Platelets 182; Potassium 4.2; Sodium 140; TSH 3.56  Recent Lipid Panel    Component Value Date/Time   CHOL 142 02/16/2021 0000   CHOL 154 10/18/2020 0915   TRIG 83 02/16/2021 0000   HDL 55 02/16/2021 0000   HDL 52 10/18/2020 0915   CHOLHDL 2.6 02/16/2021 0000   VLDL 23 08/03/2016 1115   LDLCALC 70 02/16/2021 0000    Physical Exam:    VS:  BP (!) 146/80   Pulse 75   Ht 5\' 9"  (1.753 Padilla)   Wt 187 lb 1.3 oz (84.9 kg)   SpO2 95%   BMI 27.63 kg/Padilla     Wt Readings from Last 3 Encounters:  04/20/21 187 lb 1.3 oz (84.9 kg)  02/16/21 184 lb 14.4 oz (83.9 kg)  10/17/20 191 lb (86.6 kg)     GEN: Patient is in no acute distress HEENT: Normal NECK: No JVD; No carotid bruits LYMPHATICS: No lymphadenopathy CARDIAC: Hear sounds regular, 2/6 systolic murmur at the apex. RESPIRATORY:  Clear to auscultation without rales, wheezing or rhonchi  ABDOMEN: Soft, non-tender, non-distended MUSCULOSKELETAL:  No edema; No deformity  SKIN: Warm and dry NEUROLOGIC:  Alert and oriented x 3 PSYCHIATRIC:  Normal affect   Signed, 10/19/20, MD  04/20/2021 11:20 AM    [facility]

## 2021-05-03 ENCOUNTER — Encounter: Payer: Self-pay | Admitting: Emergency Medicine

## 2021-05-03 ENCOUNTER — Emergency Department
Admission: EM | Admit: 2021-05-03 | Discharge: 2021-05-03 | Disposition: A | Discharge status: Home / Self Care | Payer: Medicare HMO | Source: Home / Self Care

## 2021-05-03 ENCOUNTER — Other Ambulatory Visit: Payer: Self-pay

## 2021-05-03 DIAGNOSIS — L03031 Cellulitis of right toe: Secondary | ICD-10-CM

## 2021-05-03 MED ORDER — DOXYCYCLINE HYCLATE 100 MG PO CAPS: 100.0000 mg | ORAL_CAPSULE | Freq: Two times a day (BID) | ORAL | 0 refills | Status: DC

## 2021-05-03 NOTE — ED Triage Notes (Signed)
Pt states he cut his right great toe on umbrella stand Friday. Pain and redness are worsening and wants to make sure it is not infected. Cleaning with peroxide.

## 2021-05-03 NOTE — Discharge Instructions (Addendum)
Advised patient to take medication as directed with food to completion.  Encouraged patient to increase daily water intake while taking this medication. 

## 2021-05-03 NOTE — ED Provider Notes (Signed)
[facility]    CSN: [id number] Arrival date & time: 05/03/21  2703      History   Chief Complaint Chief Complaint  Patient presents with   Foot Pain    Pt states he cut his right great toe on umbrella stand Friday and pain/redness is worsening. Wants to make sure it is not infected.     HPI Leonard Padilla is a 75 y.o. male.   HPI 75 year old male presents with cut of right great toe 5 to 6 days ago and is concerned with infection due to pain.   Past Medical History:  Diagnosis Date   Allergic conjunctivitis of left eye 10/16/2019   Aortic stenosis    moderate AS 12/01/15 echo (Dr. Elberta Fortis)   Arthritis    Atherosclerosis 08/19/2017   Bilateral leg edema 09/24/2017   CAD (coronary artery disease) 10/21/2019   CKD (chronic kidney disease) stage 3, GFR 30-59 ml/min (HCC) 08/04/2016   CKD stage G3a/A1, GFR 45-59 and albumin creatinine ratio <30 mg/g (HCC) 08/04/2016   Colon cancer screening declined 05/27/2018   Combined form of age-related cataract, both eyes 08/12/2018   Coronary artery disease    Diastolic dysfunction without heart failure 08/19/2017   Dyslipidemia, goal LDL below 70 03/18/2017   Dysthymia 03/18/2017   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/07/2017   Mild, repeat in 5 years (2023)   Essential hypertension 10/13/2015   Family history of prostate cancer in father 03/02/2019   Flu 09/2015   influenza A   Former smoker, stopped smoking in distant past    GERD (gastroesophageal reflux disease)    occ   Glaucoma suspect of both eyes 08/12/2018   Heart murmur    History of colon polyps    History of colonic polyps 03/02/2019   Hyperlipidemia    Hypertension goal BP (blood pressure) < 140/90 10/13/2015   Hypertensive retinopathy of both eyes 08/12/2018   Intraoperative floppy iris syndrome (IFIS) 03/02/2021   Irritability and anger 03/18/2017   Lower urinary tract symptoms (LUTS) 04/29/2017   Obstructive sleep apnea 11/29/2015   Self-discontinued CPAP. 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   Stroke (HCC) 09/2015   Symptomatic carotid artery stenosis 12/15/2015   Urinary retention due to benign prostatic hyperplasia 09/13/2017   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   Wears glasses     Patient Active Problem List   Diagnosis Date Noted   Intraoperative floppy iris syndrome (IFIS) 03/02/2021   Aortic stenosis    Arthritis    Coronary artery disease    Heart murmur    History of colon polyps    Hyperlipidemia    Sleep apnea    Wears glasses    CAD (coronary artery disease) 10/21/2019   Allergic conjunctivitis of left eye 10/16/2019   History of colonic polyps 03/02/2019   Family history of prostate cancer in father 03/02/2019   SBE (subacute  bacterial endocarditis) prophylaxis candidate 10/06/2018   Glaucoma suspect of both eyes 08/12/2018   Combined form of age-related cataract, both eyes 08/12/2018   Hypertensive retinopathy of both eyes 08/12/2018   Posterior vitreous detachment, left eye 08/12/2018   Colon cancer screening declined 05/27/2018   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   Seasonal allergic rhinitis 11/08/2017   Bilateral leg edema 09/24/2017   Urinary retention due to benign prostatic hyperplasia 09/13/2017   S/P AVR 09/09/2017   Atherosclerosis 08/19/2017   Diastolic dysfunction without heart failure 08/19/2017   Ectatic  abdominal aorta (HCC) 05/07/2017   Lower urinary tract symptoms (LUTS) 04/29/2017   Former smoker, stopped smoking in distant past    Dysthymia 03/18/2017   Irritability and anger 03/18/2017   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   Dyslipidemia, goal LDL below 70 03/18/2017   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   Postural dizziness with presyncope 08/27/2016   CKD (chronic kidney disease) stage 3, GFR 30-59 ml/min (HCC) 08/04/2016   CKD stage G3a/A1, GFR 45-59 and albumin creatinine ratio <30 mg/g (HCC) 08/04/2016   Status post CVA 08/03/2016   Status post carotid endarterectomy 12/15/2015   Symptomatic carotid artery stenosis 12/15/2015   Obstructive sleep apnea 11/29/2015   GERD (gastroesophageal reflux disease) 10/13/2015   Hypertension goal BP (blood pressure) < 140/90 10/13/2015   Essential hypertension 10/13/2015   Flu 09/2015   Pneumonia 09/2015   Stroke (HCC) 09/2015   Psoriasis 12/02/2014    Past Surgical History:  Procedure Laterality Date   AORTIC VALVE REPLACEMENT N/A 09/09/2017   Procedure: AORTIC VALVE REPLACEMENT (AVR) using a 23 Edwards Perimount Magna Ease Aortic Valve;  Surgeon: Delight Ovens, MD;  Location: [facility] OR;  Service: Open Heart Surgery;  Laterality: N/A;   COLONOSCOPY W/ BIOPSIES AND POLYPECTOMY     CORONARY ARTERY BYPASS GRAFT N/A 09/09/2017   Procedure: CORONARY ARTERY BYPASS GRAFTING (CABG) x 4 using the left internal mammary artery and right greater saphenous vein harvested endoscopically.;  Surgeon: Delight Ovens, MD;  Location: [facility] OR;  Service: Open Heart Surgery;  Laterality: N/A;   ENDARTERECTOMY Right 12/15/2015   Procedure: RIGHT CAROTID ENDARTERECTOMY WITH PATCH ANGIOPLASTY;  Surgeon: Nada Libman, MD;  Location[facility] OR;  Service: Vascular;  Laterality: Right;   EYE SURGERY Bilateral    Right 03/02/2021 Left 03/09/2021 Dr Hardie Shackleton   [facility] ANGIOPLASTY Right 12/15/2015   Procedure: RIGHT CAROTID PATCH ANGIOPLASTY;  Surgeon: Nada Libman, MD;  Location: [facility] OR;  Service: Vascular;  Laterality: Right;   RIGHT/LEFT HEART CATH AND CORONARY ANGIOGRAPHY N/A 08/23/2017   Procedure: RIGHT/LEFT HEART CATH AND CORONARY ANGIOGRAPHY;  Surgeon: Swazil[place], Peter M, MD;  Location: [facility] INVASIVE CV LAB;  Service: Cardiovascular;  Laterality: N/A;   TEE WITHOUT CARDIOVERSION N/A 09/09/2017   Procedure: TRANSESOPHAGEAL ECHOCARDIOGRAM (TEE);  Surgeon: Delight Ovens, MD;  Location: [facility] OR;  Service: Open Heart Surgery;  Laterality: N/A;   WISDOM TOOTH EXTRACTION         Home Medications    Prior to Admission medications   Medication Sig Start Date End Date Taking? Authorizing Provider  doxycycline (VIBRAMYCIN) 100 MG capsule Take 1 capsule (100 mg total) by mouth 2 (two) times daily. 05/03/21  Yes Trevor Iha, FNP  alfuzosin (UROXATRAL) 10 MG 24 hr tablet TAKE 1 TABLET(10 MG) BY MOUTH DAILY WITH BREAKFAST 02/16/21   Sunnie Nielsen, DO  aspirin EC 81 MG tablet Take 1 tablet (81 mg total) by mouth daily. 08/22/17  Revankar, Aundra Dubin, MD  atorvastatin (LIPITOR) 80 MG tablet Take 1 tablet (80 mg total) by mouth daily. 10/05/20   Sunnie Nielsen, DO  clobetasol cream (TEMOVATE) 0.05 % Apply 1 application topically as needed (Psoriasis).    [provider]  fexofenadine-pseudoephedrine (ALLEGRA-D 24) 180-240 MG 24 hr tablet Take 1 tablet by mouth as needed (allergies).    [provider]  fluticasone (FLONASE) 50 MCG/ACT nasal spray Place 2 sprays into both nostrils as needed for allergies or rhinitis.    [provider]  linaclotide Karlene Einstein) 72 MCG capsule Take 1 capsule (72 mcg total) by mouth daily before breakfast. 02/16/21   Sunnie Nielsen, DO  nitroGLYCERIN (NITROLINGUAL) 0.4 MG/SPRAY spray Place 1 spray under the tongue every 5 (five) minutes x 3 doses as needed for chest pain.    [provider]  sertraline (ZOLOFT) 50 MG tablet Take 1 tablet (50 mg total) by mouth daily. 02/16/21    Sunnie Nielsen, DO    Family History Family History  Problem Relation Age of Onset   Cancer Mother        ovarian cancer   Cancer Father        Prostate    Social History Social History   Tobacco Use   Smoking status: Former    Types: Cigarettes    Quit date: 08/07/1983    Years since quitting: 37.7   Smokeless tobacco: Never  Vaping Use   Vaping Use: Never used  Substance Use Topics   Alcohol use: Yes    Comment: 1 glass of wine daily    Drug use: No     Allergies   Viagra [sildenafil citrate] and Metoprolol   Review of Systems Review of Systems  Skin:  Positive for wound.  All other systems reviewed and are negative.   Physical Exam Triage Vital Signs ED Triage Vitals  Enc Vitals Group     BP 05/03/21 1011 (!) 145/83     Pulse Rate 05/03/21 1011 72     Resp --      Temp 05/03/21 1011 97.9 F (36.6 C)     Temp Source 05/03/21 1011 Oral     SpO2 05/03/21 1011 99 %     Weight --      Height --      Head Circumference --      Peak Flow --      Pain Score 05/03/21 1012 1     Pain Loc --      Pain Edu? --      Excl. in GC? --    No data found.  Updated Vital Signs BP (!) 145/83 (BP Location: Right Arm)   Pulse 72   Temp 97.9 F (36.6 C) (Oral)   SpO2 99%    Physical Exam Vitals and nursing note reviewed.  Constitutional:      General: He is not in acute distress.    Appearance: Normal appearance. He is normal weight. He is not ill-appearing.  HENT:     Head: Normocephalic and atraumatic.     Mouth/Throat:     Mouth: Mucous membranes are moist.     Pharynx: Oropharynx is clear.  Eyes:     Extraocular Movements: Extraocular movements intact.     Conjunctiva/sclera: Conjunctivae normal.     Pupils: Pupils are equal, round, and reactive to light.  Cardiovascular:     Rate and Rhythm: Normal rate and regular rhythm.     Pulses: Normal pulses.  Heart sounds: Normal heart sounds.  Pulmonary:     Effort: Pulmonary effort is normal.      Breath sounds: Normal breath sounds.  Musculoskeletal:        General: Normal range of motion.     Cervical back: Normal range of motion and neck supple.  Skin:    General: Skin is warm and dry.     Comments: Right great toe (dorsum over proximal phalanx):1.0 cm circular shaped skin avulsion, erythematous, indurated, fluctuant; no lymphatic streaking discharge or drainage.  Neurological:     General: No focal deficit present.     Mental Status: He is alert and oriented to person, place, and time. Mental status is at baseline.  Psychiatric:        Mood and Affect: Mood normal.        Behavior: Behavior normal.        Thought Content: Thought content normal.     UC Treatments / Results  Labs (all labs ordered are listed, but only abnormal results are displayed) Labs Reviewed - No data to display  EKG   Radiology No results found.  Procedures Procedures (including critical care time)  Medications Ordered in UC Medications - No data to display  Initial Impression / Assessment and Plan / UC Course  I have reviewed the triage vital signs and the nursing notes.  Pertinent labs & imaging results that were available during my care of the patient were reviewed by me and considered in my medical decision making (see chart for details).     MDM: 1.  Cellulitis of right great toe-Rx'd Doxycycline, Advised patient to take medication as directed with food to completion.  Encouraged patient to increase daily water intake while taking this medication.  Patient discharged home, hemodynamically stable.  Final Clinical Impressions(s) / UC Diagnoses   Final diagnoses:  Cellulitis of great toe, right     Discharge Instructions      Advised patient to take medication as directed with food to completion.  Encouraged patient to increase daily water intake while taking this medication.     ED Prescriptions     Medication Sig Dispense Auth. Provider   doxycycline (VIBRAMYCIN) 100 MG  capsule Take 1 capsule (100 mg total) by mouth 2 (two) times daily. 20 capsule Trevor Iha, FNP      PDMP not reviewed this encounter.   Trevor Iha, FNP 05/03/21 1146

## 2021-05-13 ENCOUNTER — Other Ambulatory Visit: Payer: Self-pay | Admitting: Osteopathic Medicine

## 2021-05-15 NOTE — Telephone Encounter (Signed)
Last appointment 02/16/2021, new patient appointment with Dr Ashley Royalty 08/21/2021.

## 2021-08-21 ENCOUNTER — Ambulatory Visit: Payer: Medicare HMO | Admitting: Osteopathic Medicine

## 2021-08-21 ENCOUNTER — Ambulatory Visit: Payer: Medicare HMO | Admitting: Family Medicine

## 2021-08-29 ENCOUNTER — Other Ambulatory Visit: Payer: Self-pay

## 2021-08-29 ENCOUNTER — Encounter: Payer: Self-pay | Admitting: Family Medicine

## 2021-08-29 ENCOUNTER — Ambulatory Visit (INDEPENDENT_AMBULATORY_CARE_PROVIDER_SITE_OTHER): Payer: Medicare HMO | Admitting: Family Medicine

## 2021-08-29 VITALS — BP 134/76 | HR 89 | Temp 97.6°F | Ht 69.0 in | Wt 196.0 lb

## 2021-08-29 DIAGNOSIS — K5909 Other constipation: Secondary | ICD-10-CM

## 2021-08-29 DIAGNOSIS — I10 Essential (primary) hypertension: Secondary | ICD-10-CM

## 2021-08-29 DIAGNOSIS — F341 Dysthymic disorder: Secondary | ICD-10-CM

## 2021-08-29 DIAGNOSIS — D649 Anemia, unspecified: Secondary | ICD-10-CM | POA: Diagnosis not present

## 2021-08-29 DIAGNOSIS — E782 Mixed hyperlipidemia: Secondary | ICD-10-CM

## 2021-08-29 DIAGNOSIS — I2510 Atherosclerotic heart disease of native coronary artery without angina pectoris: Secondary | ICD-10-CM | POA: Diagnosis not present

## 2021-08-29 MED ORDER — LINACLOTIDE 145 MCG PO CAPS: ORAL_CAPSULE | ORAL | 3 refills | Status: DC

## 2021-08-29 NOTE — Patient Instructions (Signed)
Great to meet you today! Continue current medications.  See me again in 6 months.  

## 2021-08-30 ENCOUNTER — Other Ambulatory Visit: Payer: Self-pay | Admitting: Family Medicine

## 2021-08-30 DIAGNOSIS — I10 Essential (primary) hypertension: Secondary | ICD-10-CM

## 2021-08-30 LAB — CBC WITH DIFFERENTIAL/PLATELET
Absolute Monocytes: 562 cells/uL (ref 200–950)
Basophils Absolute: 58 cells/uL (ref 0–200)
Basophils Relative: 0.8 %
Eosinophils Absolute: 482 cells/uL (ref 15–500)
Eosinophils Relative: 6.6 %
HCT: 42.5 % (ref 38.5–50.0)
Hemoglobin: 14.3 g/dL (ref 13.2–17.1)
Lymphs Abs: 1905 cells/uL (ref 850–3900)
MCH: 30.8 pg (ref 27.0–33.0)
MCHC: 33.6 g/dL (ref 32.0–36.0)
MCV: 91.6 fL (ref 80.0–100.0)
MPV: 10.3 fL (ref 7.5–12.5)
Monocytes Relative: 7.7 %
Neutro Abs: 4292 cells/uL (ref 1500–7800)
Neutrophils Relative %: 58.8 %
Platelets: 208 10*3/uL (ref 140–400)
RBC: 4.64 10*6/uL (ref 4.20–5.80)
RDW: 12.2 % (ref 11.0–15.0)
Total Lymphocyte: 26.1 %
WBC: 7.3 10*3/uL (ref 3.8–10.8)

## 2021-08-30 LAB — COMPLETE METABOLIC PANEL WITH GFR
AG Ratio: 1.5 (calc) (ref 1.0–2.5)
ALT: 25 U/L (ref 9–46)
AST: 23 U/L (ref 10–35)
Albumin: 4.4 g/dL (ref 3.6–5.1)
Alkaline phosphatase (APISO): 81 U/L (ref 35–144)
BUN/Creatinine Ratio: 13 (calc) (ref 6–22)
BUN: 24 mg/dL (ref 7–25)
CO2: 28 mmol/L (ref 20–32)
Calcium: 9.7 mg/dL (ref 8.6–10.3)
Chloride: 108 mmol/L (ref 98–110)
Creat: 1.8 mg/dL — ABNORMAL HIGH (ref 0.70–1.28)
Globulin: 2.9 g/dL (calc) (ref 1.9–3.7)
Glucose, Bld: 94 mg/dL (ref 65–99)
Potassium: 5 mmol/L (ref 3.5–5.3)
Sodium: 141 mmol/L (ref 135–146)
Total Bilirubin: 0.5 mg/dL (ref 0.2–1.2)
Total Protein: 7.3 g/dL (ref 6.1–8.1)
eGFR: 38 mL/min/{1.73_m2} — ABNORMAL LOW (ref >=60)

## 2021-08-30 LAB — IRON,TIBC AND FERRITIN PANEL
%SAT: 35 % (calc) (ref 20–48)
Ferritin: 115 ng/mL (ref 24–380)
Iron: 108 ug/dL (ref 50–180)
TIBC: 312 mcg/dL (calc) (ref 250–425)

## 2021-08-30 NOTE — Progress Notes (Signed)
Pt has been advised of results and recommendations. Leonard Padilla states that he just added a water app to his phone that will remind him to drink water every hour.   He agrees to labs in 1 month.

## 2021-09-03 DIAGNOSIS — K5909 Other constipation: Secondary | ICD-10-CM

## 2021-09-03 HISTORY — DX: Other constipation: K59.09

## 2021-09-03 NOTE — Assessment & Plan Note (Signed)
He is followed by cardiology.  No anginal symptoms at this time.  Continue aspirin and statin.

## 2021-09-03 NOTE — Assessment & Plan Note (Signed)
Stable with sertraline at current strength.  Continue.

## 2021-09-03 NOTE — Assessment & Plan Note (Signed)
This remains well controlled with Linzess.  Continue at current strength of 145 mg daily.

## 2021-09-03 NOTE — Progress Notes (Signed)
Leonard Padilla - 76 y.o. male MRN [id number]  Date of birth: [birth year]  Subjective Chief Complaint  Patient presents with   Constipation    HPI Leonard Padilla is a 76 year old male here today for follow-up visit.  He is transferring care from Dr. Sheppard Coil.  He has history of hypertension, coronary artery disease, history of CVA, chronic constipation, depression and CKD.  Reports overall he is doing well.  Tolerating chronic medications well without significant side effects.  Due to have updated labs for follow-up of anemia.  He has been taking an iron supplement.  He does continue to see cardiology for management of CAD.  Denies any new anginal symptoms.  ROS:  A comprehensive ROS was completed and negative except as noted per HPI  Allergies  Allergen Reactions   Viagra [Sildenafil Citrate] Other (See Comments)    COLOR DISCRIMINATION [DOSE RELATED] "blue sensation"   Metoprolol Other (See Comments)    Orthostasis, near syncope    Past Medical History:  Diagnosis Date   Allergic conjunctivitis of left eye 10/16/2019   Aortic stenosis    moderate AS 12/01/15 echo (Dr. Curt Bears)   Arthritis    Atherosclerosis 08/19/2017   Bilateral leg edema 09/24/2017   CAD (coronary artery disease) 10/21/2019   CKD (chronic kidney disease) stage 3, GFR 30-59 ml/min ([place]) 08/04/2016   CKD stage G3a/A1, GFR 45-59 and albumin creatinine ratio <30 mg/g (HCC) 08/04/2016   Colon cancer screening declined 05/27/2018   Combined form of age-related cataract, both eyes 08/12/2018   Coronary artery disease    Diastolic dysfunction without heart failure 08/19/2017   Dyslipidemia, goal LDL below 70 03/18/2017   Dysthymia 03/18/2017   Ectatic abdominal aorta ([place]) 05/07/2017   Mild, repeat in 5 years (2023)   Essential hypertension 10/13/2015   Family history of prostate cancer in father 03/02/2019   Flu 09/2015   influenza A   Former smoker, stopped smoking in distant past    GERD (gastroesophageal reflux disease)     occ   Glaucoma suspect of both eyes 08/12/2018   Heart murmur    History of colon polyps    History of colonic polyps 03/02/2019   Hyperlipidemia    Hypertension goal BP (blood pressure) < 140/90 10/13/2015   Hypertensive retinopathy of both eyes 08/12/2018   Intraoperative floppy iris syndrome (IFIS) 03/02/2021   Irritability and anger 03/18/2017   Lower urinary tract symptoms (LUTS) 04/29/2017   Obstructive sleep apnea 11/29/2015   Self-discontinued CPAP. 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   Stroke ([place]) 09/2015   Symptomatic carotid artery stenosis 12/15/2015   Urinary retention due to benign prostatic hyperplasia 09/13/2017   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   Wears glasses     Past Surgical History:  Procedure Laterality Date   AORTIC VALVE REPLACEMENT N/A 09/09/2017   Procedure: AORTIC VALVE REPLACEMENT (AVR) using a 23 Edwards Perimount Magna Ease Aortic  Valve;  Surgeon: Grace Isaac, MD;  Location: [place];  Service: Open Heart Surgery;  Laterality: N/A;   COLONOSCOPY W/ BIOPSIES AND POLYPECTOMY     CORONARY ARTERY BYPASS GRAFT N/A 09/09/2017   Procedure: CORONARY ARTERY BYPASS GRAFTING (CABG) x 4 using the left internal mammary artery and right greater saphenous vein harvested endoscopically.;  Surgeon: Grace Isaac, MD;  Location: [place];  Service: Open Heart Surgery;  Laterality: N/A;    ENDARTERECTOMY Right 12/15/2015   Procedure: RIGHT CAROTID ENDARTERECTOMY WITH PATCH ANGIOPLASTY;  Surgeon: Serafina Mitchell, MD;  Location: [place];  Service: Vascular;  Laterality: Right;   EYE SURGERY Bilateral    Right 03/02/2021 Left 03/09/2021 Dr Larose Kells   [facility] ANGIOPLASTY Right 12/15/2015   Procedure: RIGHT CAROTID PATCH ANGIOPLASTY;  Surgeon: Serafina Mitchell, MD;  Location: [place];  Service: Vascular;  Laterality: Right;   RIGHT/LEFT HEART CATH AND CORONARY ANGIOGRAPHY N/A 08/23/2017   Procedure: RIGHT/LEFT HEART CATH AND CORONARY ANGIOGRAPHY;  Surgeon: Martinique, Peter M, MD;  Location: [place] CV LAB;  Service: Cardiovascular;  Laterality: N/A;   TEE WITHOUT CARDIOVERSION N/A 09/09/2017   Procedure: TRANSESOPHAGEAL ECHOCARDIOGRAM (TEE);  Surgeon: Grace Isaac, MD;  Location: [place];  Service: Open Heart Surgery;  Laterality: N/A;   WISDOM TOOTH EXTRACTION      Social History   Socioeconomic History   Marital status: Married    Spouse name: [name]   Number of children: 2   Years of education: 13   Highest education level: Not on file  Occupational History   Occupation: Retired    Comment: retired Nature conservation officer, [employer]  Tobacco Use   Smoking status: Former    Types: Cigarettes    Quit date: 08/07/1983    Years since quitting: 38.1   Smokeless tobacco: Never  Vaping Use   Vaping Use: Never used  Substance and Sexual Activity   Alcohol use: Yes    Comment: 1 glass of wine daily    Drug use: No   Sexual activity: Not Currently  Other Topics Concern   Not on file  Social History Narrative   Lives with wife   Caffeine use- coffee 3-4 cups daily   Social Determinants of Health   Financial Resource Strain: Not on file  Food Insecurity: Not on file  Transportation Needs: Not on file  Physical Activity: Not on file  Stress: Not on file  Social Connections: Not on file    Family History  Problem Relation Age of Onset   Cancer Mother        ovarian cancer   Cancer  Father        Prostate    Health Maintenance  Topic Date Due   Zoster Vaccines- Shingrix (1 of 2) Never done   TETANUS/TDAP  08/06/2022   Pneumonia Vaccine 79+ Years old  Completed   INFLUENZA VACCINE  Completed   COVID-19 Vaccine  Completed   Hepatitis C Screening  Completed   HPV VACCINES  Aged Out     ----------------------------------------------------------------------------------------------------------------------------------------------------------------------------------------------------------------- Physical Exam BP 134/76 (BP Location: Left Arm, Patient Position: Sitting, Cuff Size: Normal)    Pulse 89    Temp 97.6 F (36.4 C)    Ht 5\' 9"  (1.753 m)    Wt 196 lb (88.9 kg)    SpO2 97%    BMI 28.94 kg/m   Physical Exam Constitutional:      Appearance: Normal appearance.  Eyes:     General: No scleral icterus. Cardiovascular:  Rate and Rhythm: Normal rate and regular rhythm.  Pulmonary:     Effort: Pulmonary effort is normal.     Breath sounds: Normal breath sounds.  Musculoskeletal:     Cervical back: Neck supple.  Neurological:     General: No focal deficit present.     Mental Status: He is alert.  Psychiatric:        Mood and Affect: Mood normal.        Behavior: Behavior normal.    ------------------------------------------------------------------------------------------------------------------------------------------------------------------------------------------------------------------- Assessment and Plan  Hypertension goal BP (blood pressure) < 140/90 Blood pressure remains well controlled at this time.  No medications currently, encourage low-sodium diet.  Coronary artery disease He is followed by cardiology.  No anginal symptoms at this time.  Continue aspirin and statin.  Hyperlipidemia Stable with atorvastatin at current strength.  Dysthymia Stable with sertraline at current strength.  Continue.  Chronic constipation This remains well  controlled with Linzess.  Continue at current strength of 145 mg daily.   Meds ordered this encounter  Medications   DISCONTD: linaclotide (LINZESS) 145 MCG CAPS capsule    Sig: TAKE 1 CAPSULE BY MOUTH DAILY BEFORE BREAKFAST    Dispense:  90 capsule    Refill:  3   linaclotide (LINZESS) 145 MCG CAPS capsule    Sig: TAKE 1 CAPSULE BY MOUTH DAILY BEFORE BREAKFAST    Dispense:  90 capsule    Refill:  3    Return in about 6 months (around 02/26/2022) for HTN.    This visit occurred during the SARS-CoV-2 public health emergency.  Safety protocols were in place, including screening questions prior to the visit, additional usage of staff PPE, and extensive cleaning of exam room while observing appropriate contact time as indicated for disinfecting solutions.

## 2021-09-03 NOTE — Assessment & Plan Note (Signed)
Stable with atorvastatin at current strength.

## 2021-09-03 NOTE — Assessment & Plan Note (Signed)
Blood pressure remains well controlled at this time.  No medications currently, encourage low-sodium diet.

## 2021-09-08 ENCOUNTER — Encounter: Payer: Self-pay | Admitting: Family Medicine

## 2021-10-25 ENCOUNTER — Other Ambulatory Visit: Payer: Self-pay

## 2021-10-25 DIAGNOSIS — E782 Mixed hyperlipidemia: Secondary | ICD-10-CM

## 2021-10-25 MED ORDER — ATORVASTATIN CALCIUM 80 MG PO TABS: 80.0000 mg | ORAL_TABLET | Freq: Every day | ORAL | 3 refills | Status: DC

## 2021-11-01 ENCOUNTER — Other Ambulatory Visit: Payer: Self-pay

## 2021-11-02 ENCOUNTER — Encounter: Payer: Self-pay | Admitting: Cardiology

## 2021-11-02 ENCOUNTER — Ambulatory Visit: Payer: Medicare HMO | Admitting: Cardiology

## 2021-11-02 VITALS — BP 126/72 | HR 80 | Ht 69.0 in | Wt 194.1 lb

## 2021-11-02 DIAGNOSIS — Z952 Presence of prosthetic heart valve: Secondary | ICD-10-CM | POA: Diagnosis not present

## 2021-11-02 DIAGNOSIS — E785 Hyperlipidemia, unspecified: Secondary | ICD-10-CM

## 2021-11-02 DIAGNOSIS — Z8673 Personal history of transient ischemic attack (TIA), and cerebral infarction without residual deficits: Secondary | ICD-10-CM

## 2021-11-02 DIAGNOSIS — Z951 Presence of aortocoronary bypass graft: Secondary | ICD-10-CM | POA: Diagnosis not present

## 2021-11-02 NOTE — Patient Instructions (Signed)
Medication Instructions:  ?Your physician recommends that you continue on your current medications as directed. Please refer to the Current Medication list given to you today.  ?*If you need a refill on your cardiac medications before your next appointment, please call your pharmacy* ? ? ?Lab Work: ?None Ordered ?If you have labs (blood work) drawn today and your tests are completely normal, you will receive your results only by: ?MyChart Message (if you have MyChart) OR ?A paper copy in the mail ?If you have any lab test that is abnormal or we need to change your treatment, we will call you to review the results. ? ? ?Testing/Procedures: ?ECHOCARDIOGRAM ?Your physician has requested that you have an echocardiogram. Echocardiography is a painless test that uses sound waves to create images of your heart. It provides your doctor with information about the size and shape of your heart and how well your heart?s chambers and valves are working. This procedure takes approximately one hour. There are no restrictions for this procedure.  ? ? ?Follow-Up: ?At [facility], you and your health needs are our priority.  As part of our continuing mission to provide you with exceptional heart care, we have created designated Provider Care Teams.  These Care Teams include your primary Cardiologist (physician) and Advanced Practice Providers (APPs -  Physician Assistants and Nurse Practitioners) who all work together to provide you with the care you need, when you need it. ? ?We recommend signing up for the patient portal called "MyChart".  Sign up information is provided on this After Visit Summary.  MyChart is used to connect with patients for Virtual Visits (Telemedicine).  Patients are able to view lab/test results, encounter notes, upcoming appointments, etc.  Non-urgent messages can be sent to your provider as well.   ?To learn more about what you can do with MyChart, go to [email or web address].   ? ?Your next  appointment:   ?9 month(s) ? ?The format for your next appointment:   ?In Person ? ?Provider:   ?Belva Crome, MD  ? ? ?Other Instructions ?None . ?

## 2021-11-02 NOTE — Progress Notes (Signed)
?Cardiology Office Note:   ? ?Date:  11/02/2021  ? ?ID:  Leonard Padilla, DOB Aug 08, 1945, MRN [id number] ? ?PCP:  Luetta Nutting, DO  ?Cardiologist:  Jenean Lindau, MD  ? ?Referring MD: Emeterio Reeve, DO  ? ? ?ASSESSMENT:   ? ?1. Dyslipidemia, goal LDL below 70   ?2. Status post CVA   ?3. Status post four vessel coronary artery bypass   ?4. S/P AVR   ? ?PLAN:   ? ?In order of problems listed above: ? ?Coronary artery disease: Secondary prevention stressed to the patient.  Importance of compliance with diet and medication stressed he vocalized understanding.  He was advised to walk at least half an hour a day 5 days a week and he promises to do so. ?Essential hypertension: Blood pressure stable and diet was emphasized. ?Mixed dyslipidemia: Lipids reviewed and discussed with the patient at length.  He recently had blood work with primary care and he was told that they were fine also. ?Prosthetic aortic valve: Stable at this time.  We will have her follow-up with an echocardiogram. ?Patient will be seen in follow-up appointment in 9 months or earlier if the patient has any concerns ? ? ? ?Medication Adjustments/Labs and Tests Ordered: ?Current medicines are reviewed at length with the patient today.  Concerns regarding medicines are outlined above.  ?No orders of the defined types were placed in this encounter. ? ?No orders of the defined types were placed in this encounter. ? ? ? ?No chief complaint on file. ?  ? ?History of Present Illness:   ? ?Leonard Padilla is a 76 y.o. male.  Patient has past medical history of coronary artery disease, essential hypertension, aortic valve stenosis post replacement, mixed dyslipidemia.  Patient denies any problems at this time and takes care of activities of daily living.  No chest pain orthopnea or PND.  At the time of my evaluation, the patient is alert awake oriented and in no distress. ? ?Past Medical History:  ?Diagnosis Date  ? Allergic conjunctivitis of left eye  10/16/2019  ? Aortic stenosis   ? moderate AS 12/01/15 echo (Dr. Curt Bears)  ? Arthritis   ? Bilateral leg edema 09/24/2017  ? Chronic constipation 09/03/2021  ? CKD (chronic kidney disease) stage 3, GFR 30-59 ml/min (HCC) 08/04/2016  ? CKD stage G3a/A1, GFR 45-59 and albumin creatinine ratio <30 mg/g ([place]) 08/04/2016  ? Colon cancer screening declined 05/27/2018  ? Combined form of age-related cataract, both eyes 08/12/2018  ? Coronary artery disease   ? Diastolic dysfunction without heart failure 08/19/2017  ? Dyslipidemia, goal LDL below 70 03/18/2017  ? Dysthymia 03/18/2017  ? Ectatic abdominal aorta ([place]) 05/07/2017  ? Mild, repeat in 5 years (2023)  ? Family history of prostate cancer in father 03/02/2019  ? Former smoker, stopped smoking in distant past   ? Heart murmur   ? History of colon polyps   ? History of colonic polyps 03/02/2019  ? Hyperlipidemia   ? Hypertension goal BP (blood pressure) < 140/90 10/13/2015  ? Hypertensive retinopathy of both eyes 08/12/2018  ? Intraoperative floppy iris syndrome (IFIS) 03/02/2021  ? Irritability and anger 03/18/2017  ? Lower urinary tract symptoms (LUTS) 04/29/2017  ? Posterior vitreous detachment, left eye 08/12/2018  ? Postural dizziness with presyncope 08/27/2016  ? Psoriasis   ? S/P AVR 09/09/2017  ? Edwards Lifesciences 23 mm, model 3300TFX, serial [id number]    ? SBE (subacute bacterial endocarditis) prophylaxis candidate 10/06/2018  ? Sleep apnea   ?  to pick cpap tomorrow 12/13/15  ? Status post carotid endarterectomy 12/15/2015  ? Right sided B/l plaques <50% velocity (09/2016)  ? Status post CVA 08/03/2016  ? 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  ? Stroke [facility]) 09/2015  ? Symptomatic carotid artery stenosis 12/15/2015  ? Urinary retention due to benign prostatic hyperplasia 09/13/2017  ? 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 Wears glasses   ? ? ?Past Surgical History:  ?Procedure Laterality Date  ? AORTIC VALVE REPLACEMENT N/A  09/09/2017  ? Procedure: AORTIC VALVE REPLACEMENT (AVR) using a 23 Edwards Perimount Magna Ease Aortic Valve;  Surgeon: Grace Isaac, MD;  Location: [place];  Service: Open Heart Surgery;  Laterality: N/A;  ? COLONOSCOPY W/ BIOPSIES AND POLYPECTOMY    ? CORONARY ARTERY BYPASS GRAFT N/A 09/09/2017  ? Procedure: CORONARY ARTERY BYPASS GRAFTING (CABG) x 4 using the left internal mammary artery and right greater saphenous vein harvested endoscopically.;  Surgeon: Grace Isaac, MD;  Location: [place];  Service: Open Heart Surgery;  Laterality: N/A;  ? ENDARTERECTOMY Right 12/15/2015  ? Procedure: RIGHT CAROTID ENDARTERECTOMY WITH PATCH ANGIOPLASTY;  Surgeon: Serafina Mitchell, MD;  Location[facility];  Service: Vascular;  Laterality: Right;  ? EYE SURGERY Bilateral   ? Right 03/02/2021 Left 03/09/2021 Dr Larose Kells  ? PATCH ANGIOPLASTY Right 12/15/2015  ? Procedure: RIGHT CAROTID PATCH ANGIOPLASTY;  Surgeon: Serafina Mitchell, MD;  Location: [place];  Service: Vascular;  Laterality: Right;  ? RIGHT/LEFT HEART CATH AND CORONARY ANGIOGRAPHY N/A 08/23/2017  ? Procedure: RIGHT/LEFT HEART CATH AND CORONARY ANGIOGRAPHY;  Surgeon: Martinique, Peter M, MD;  Location: [place] CV LAB;  Service: Cardiovascular;  Laterality: N/A;  ? TEE WITHOUT CARDIOVERSION N/A 09/09/2017  ? Procedure: TRANSESOPHAGEAL ECHOCARDIOGRAM (TEE);  Surgeon: Grace Isaac, MD;  Location: [place];  Service: Open Heart Surgery;  Laterality: N/A;  ? WISDOM TOOTH EXTRACTION    ? ? ?Current Medications: ?Current Meds  ?Medication Sig  ? alfuzosin (UROXATRAL) 10 MG 24 hr tablet TAKE 1 TABLET(10 MG) BY MOUTH DAILY WITH BREAKFAST  ? aspirin EC 81 MG tablet Take 1 tablet (81 mg total) by mouth daily.  ? atorvastatin (LIPITOR) 80 MG tablet Take 1 tablet (80 mg total) by mouth daily.  ? clobetasol cream (TEMOVATE) [id number] % Apply 1 application topically as needed (Psoriasis).  ? FERROUS SULFATE PO Take 1 tablet by mouth daily.  ? fexofenadine-pseudoephedrine (ALLEGRA-D 24)  180-240 MG 24 hr tablet Take 1 tablet by mouth as needed (allergies).  ? fluticasone (FLONASE) 50 MCG/ACT nasal spray Place 2 sprays into both nostrils as needed for allergies or rhinitis.  ? linaclotide (LINZESS) 145 MCG CAPS capsule TAKE 1 CAPSULE BY MOUTH DAILY BEFORE BREAKFAST  ? nitroGLYCERIN (NITROLINGUAL) 0.4 MG/SPRAY spray Place 1 spray under the tongue every 5 (five) minutes x 3 doses as needed for chest pain.  ? sertraline (ZOLOFT) 50 MG tablet Take 1 tablet (50 mg total) by mouth daily.  ?  ? ?Allergies:   Viagra [sildenafil citrate] and Metoprolol  ? ?Social History  ? ?Socioeconomic History  ? Marital status: Married  ?  Spouse name: Florentina Jenny  ? Number of children: 2  ? Years of education: 40  ? Highest education level: Not on file  ?Occupational History  ? Occupation: Retired  ?  Comment: retired Nature conservation officer, [employer]  ?Tobacco Use  ? Smoking status: Former  ?  Types: Cigarettes  ?  Quit date: 08/07/1983  ?  Years since quitting:  38.2  ? Smokeless tobacco: Never  ?Vaping Use  ? Vaping Use: Never used  ?Substance and Sexual Activity  ? Alcohol use: Yes  ?  Comment: 1 glass of wine daily   ? Drug use: No  ? Sexual activity: Not Currently  ?Other Topics Concern  ? Not on file  ?Social History Narrative  ? Lives with wife  ? Caffeine use- coffee 3-4 cups daily  ? ?Social Determinants of Health  ? ?Financial Resource Strain: Not on file  ?Food Insecurity: Not on file  ?Transportation Needs: Not on file  ?Physical Activity: Not on file  ?Stress: Not on file  ?Social Connections: Not on file  ?  ? ?Family History: ?The patient's family history includes Cancer in his father and mother. ? ?ROS:   ?Please see the history of present illness.    ?All other systems reviewed and are negative. ? ?EKGs/Labs/Other Studies Reviewed:   ? ?The following studies were reviewed today: ?I discussed my findings with the patient at length.  Echo from October 2021 was reviewed. ? ? ?Recent Labs: ?02/16/2021: TSH 3.56 ?08/29/2021: ALT  25; BUN 24; Creat 1.80; Hemoglobin 14.3; Platelets 208; Potassium 5.0; Sodium 141  ?Recent Lipid Panel ?   ?Component Value Date/Time  ? CHOL 142 02/16/2021 0000  ? CHOL 154 10/18/2020 0915  ? TRIG 83 02/16/2021 0000  ?

## 2021-11-16 ENCOUNTER — Ambulatory Visit (HOSPITAL_BASED_OUTPATIENT_CLINIC_OR_DEPARTMENT_OTHER)
Admission: RE | Admit: 2021-11-16 | Discharge: 2021-11-16 | Disposition: A | Discharge status: Home / Self Care | Payer: Medicare HMO | Source: Ambulatory Visit | Attending: Cardiology | Admitting: Cardiology

## 2021-11-16 DIAGNOSIS — Z951 Presence of aortocoronary bypass graft: Secondary | ICD-10-CM | POA: Insufficient documentation

## 2021-11-16 DIAGNOSIS — I10 Essential (primary) hypertension: Secondary | ICD-10-CM | POA: Diagnosis not present

## 2021-11-16 DIAGNOSIS — I350 Nonrheumatic aortic (valve) stenosis: Secondary | ICD-10-CM | POA: Insufficient documentation

## 2021-11-16 DIAGNOSIS — R011 Cardiac murmur, unspecified: Secondary | ICD-10-CM | POA: Diagnosis not present

## 2021-11-16 DIAGNOSIS — E785 Hyperlipidemia, unspecified: Secondary | ICD-10-CM | POA: Diagnosis not present

## 2021-11-16 DIAGNOSIS — I2510 Atherosclerotic heart disease of native coronary artery without angina pectoris: Secondary | ICD-10-CM | POA: Diagnosis not present

## 2021-11-16 LAB — ECHOCARDIOGRAM COMPLETE
AR max vel: 1.32 cm2
AV Area VTI: 1.45 cm2
AV Area mean vel: 1.38 cm2
AV Mean grad: 16 mmHg
AV Peak grad: 29.4 mmHg
Ao pk vel: 2.71 m/s
Area-P 1/2: 4.89 cm2
S' Lateral: 2.5 cm

## 2021-11-16 NOTE — Progress Notes (Signed)
?  Echocardiogram ?2D Echocardiogram has been performed. ? ?Leonard Padilla ?11/16/2021, 12:36 PM ?

## 2021-12-14 ENCOUNTER — Ambulatory Visit (INDEPENDENT_AMBULATORY_CARE_PROVIDER_SITE_OTHER): Payer: Medicare HMO | Admitting: Family Medicine

## 2021-12-14 ENCOUNTER — Encounter: Payer: Self-pay | Admitting: Family Medicine

## 2021-12-14 DIAGNOSIS — L570 Actinic keratosis: Secondary | ICD-10-CM

## 2021-12-14 HISTORY — DX: Actinic keratosis: L57.0

## 2021-12-14 NOTE — Progress Notes (Signed)
?Leonard Padilla - 76 y.o. male MRN [id number]  Date of birth: Apr 05, 1946 ? ?Subjective ?Chief Complaint  ?Patient presents with  ? ingrown hair  ? ? ?HPI ?Leonard Padilla is a 76 y.o. male here today with complaint of skin lesion.  Thinks it may be an ingrown hair on his chest.  Has been present for several weeks-months.  Doesn't really have any pain from this.   No history of skin cancers.   ? ?ROS:  A comprehensive ROS was completed and negative except as noted per HPI ?  ?Allergies  ?Allergen Reactions  ? Viagra [Sildenafil Citrate] Other (See Comments)  ?  COLOR DISCRIMINATION [DOSE RELATED] ?"blue sensation"  ? Metoprolol Other (See Comments)  ?  Orthostasis, near syncope  ? ? ?Past Medical History:  ?Diagnosis Date  ? Allergic conjunctivitis of left eye 10/16/2019  ? Aortic stenosis   ? moderate AS 12/01/15 echo (Dr. Curt Bears)  ? Arthritis   ? Bilateral leg edema 09/24/2017  ? Chronic constipation 09/03/2021  ? CKD (chronic kidney disease) stage 3, GFR 30-59 ml/min (HCC) 08/04/2016  ? CKD stage G3a/A1, GFR 45-59 and albumin creatinine ratio <30 mg/g ([place]) 08/04/2016  ? Colon cancer screening declined 05/27/2018  ? Combined form of age-related cataract, both eyes 08/12/2018  ? Coronary artery disease   ? Diastolic dysfunction without heart failure 08/19/2017  ? Dyslipidemia, goal LDL below 70 03/18/2017  ? Dysthymia 03/18/2017  ? Ectatic abdominal aorta ([place]) 05/07/2017  ? Mild, repeat in 5 years (2023)  ? Family history of prostate cancer in father 03/02/2019  ? Former smoker, stopped smoking in distant past   ? Heart murmur   ? History of colon polyps   ? History of colonic polyps 03/02/2019  ? Hyperlipidemia   ? Hypertension goal BP (blood pressure) < 140/90 10/13/2015  ? Hypertensive retinopathy of both eyes 08/12/2018  ? Intraoperative floppy iris syndrome (IFIS) 03/02/2021  ? Irritability and anger 03/18/2017  ? Lower urinary tract symptoms (LUTS) 04/29/2017  ? Posterior vitreous detachment, left eye 08/12/2018   ? Postural dizziness with presyncope 08/27/2016  ? Psoriasis   ? S/P AVR 09/09/2017  ? Edwards Lifesciences 23 mm, model 3300TFX, serial [id number]    ? SBE (subacute bacterial endocarditis) prophylaxis candidate 10/06/2018  ? Sleep apnea   ? to pick cpap tomorrow 12/13/15  ? Status post carotid endarterectomy 12/15/2015  ? Right sided B/l plaques <50% velocity (09/2016)  ? Status post CVA 08/03/2016  ? 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  ? Stroke [facility]) 09/2015  ? Symptomatic carotid artery stenosis 12/15/2015  ? Urinary retention due to benign prostatic hyperplasia 09/13/2017  ? 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 Wears glasses   ? ? ?Past Surgical History:  ?Procedure Laterality Date  ? AORTIC VALVE REPLACEMENT N/A 09/09/2017  ? Procedure: AORTIC VALVE REPLACEMENT (AVR) using a 23 Edwards Perimount Magna Ease Aortic Valve;  Surgeon: Grace Isaac, MD;  Location: [place];  Service: Open Heart Surgery;  Laterality: N/A;  ? COLONOSCOPY W/ BIOPSIES AND POLYPECTOMY    ? CORONARY ARTERY BYPASS GRAFT N/A 09/09/2017  ? Procedure: CORONARY ARTERY BYPASS GRAFTING (CABG) x 4 using the left internal mammary artery and right greater saphenous vein harvested endoscopically.;  Surgeon: Grace Isaac, MD;  Location: [place];  Service: Open Heart Surgery;  Laterality: N/A;  ? ENDARTERECTOMY Right 12/15/2015  ? Procedure: RIGHT CAROTID ENDARTERECTOMY WITH PATCH ANGIOPLASTY;  Surgeon: Serafina Mitchell, MD;  Location: [place];  Service: Vascular;  Laterality: Right;  ? EYE SURGERY Bilateral   ? Right 03/02/2021 Left 03/09/2021 Dr Larose Kells  ? PATCH ANGIOPLASTY Right 12/15/2015  ? Procedure: RIGHT CAROTID PATCH ANGIOPLASTY;  Surgeon: Serafina Mitchell, MD;  Location: [place];  Service: Vascular;  Laterality: Right;  ? RIGHT/LEFT HEART CATH AND CORONARY ANGIOGRAPHY N/A 08/23/2017  ? Procedure: RIGHT/LEFT HEART CATH AND CORONARY ANGIOGRAPHY;  Surgeon: Martinique, Peter M, MD;  Location: [place] CV LAB;  Service:  Cardiovascular;  Laterality: N/A;  ? TEE WITHOUT CARDIOVERSION N/A 09/09/2017  ? Procedure: TRANSESOPHAGEAL ECHOCARDIOGRAM (TEE);  Surgeon: Grace Isaac, MD;  Location: [place];  Service: Open Heart Surgery;  Laterality: N/A;  ? WISDOM TOOTH EXTRACTION    ? ? ?Social History  ? ?Socioeconomic History  ? Marital status: Married  ?  Spouse name: Leonard Padilla  ? Number of children: 2  ? Years of education: 29  ? Highest education level: Not on file  ?Occupational History  ? Occupation: Retired  ?  Comment: retired Nature conservation officer, [employer]  ?Tobacco Use  ? Smoking status: Former  ?  Types: Cigarettes  ?  Quit date: 08/07/1983  ?  Years since quitting: 38.3  ? Smokeless tobacco: Never  ?Vaping Use  ? Vaping Use: Never used  ?Substance and Sexual Activity  ? Alcohol use: Yes  ?  Comment: 1 glass of wine daily   ? Drug use: No  ? Sexual activity: Not Currently  ?Other Topics Concern  ? Not on file  ?Social History Narrative  ? Lives with wife  ? Caffeine use- coffee 3-4 cups daily  ? ?Social Determinants of Health  ? ?Financial Resource Strain: Not on file  ?Food Insecurity: Not on file  ?Transportation Needs: Not on file  ?Physical Activity: Not on file  ?Stress: Not on file  ?Social Connections: Not on file  ? ? ?Family History  ?Problem Relation Age of Onset  ? Cancer Mother   ?     ovarian cancer  ? Cancer Father   ?     Prostate  ? ? ?Health Maintenance  ?Topic Date Due  ? INFLUENZA VACCINE  03/06/2022  ? TETANUS/TDAP  08/06/2022  ? Pneumonia Vaccine 23+ Years old  Completed  ? COVID-19 Vaccine  Completed  ? Hepatitis C Screening  Completed  ? Zoster Vaccines- Shingrix  Completed  ? HPV VACCINES  Aged Out  ? ? ? ?----------------------------------------------------------------------------------------------------------------------------------------------------------------------------------------------------------------- ?Physical Exam ?BP 132/72 (BP Location: Left Arm, Patient Position: Sitting, Cuff Size: Normal)   Pulse 70    Ht 5\' 9"  (1.753 m)   Wt 189 lb (85.7 kg)   SpO2 95%   BMI 27.91 kg/m?  ? ?Physical Exam ?Constitutional:   ?   Appearance: Normal appearance.  ?Skin: ?   Comments: Raised, hyperkeratotic lesion to upper chest wall adjacent to median sternotomy scar.  There is some mild erythema of the base.  ? No fluctuance drainage.  ? ?  ?Neurological:  ?   General: No focal deficit present.  ?   Mental Status: He is alert.  ?Psychiatric:     ?   Mood and Affect: Mood normal.     ?   Behavior: Behavior normal.  ? ? ?------------------------------------------------------------------------------------------------------------------------------------------------------------------------------------------------------------------- ?Assessment and Plan ? ?Actinic keratosis ?Treated with liquid nitrogen today.  Utilized 3 freeze/ thaw cycles achieving a 27mm frost ring around lesion.  He tolerated this well.  Post procedure instructions given.  ? ? ?No orders of the defined types were placed in  this encounter. ? ? ?No follow-ups on file. ? ? ? ?This visit occurred during the SARS-CoV-2 public health emergency.  Safety protocols were in place, including screening questions prior to the visit, additional usage of staff PPE, and extensive cleaning of exam room while observing appropriate contact time as indicated for disinfecting solutions.  ? ?

## 2021-12-14 NOTE — Patient Instructions (Signed)
The area on the chest may form a blister.  Leave this intact. If it bursts you can cover with small amount of vaseline or neosporin.   ? ?If area doesn't resolve let me know.   ?

## 2021-12-14 NOTE — Assessment & Plan Note (Signed)
Treated with liquid nitrogen today.  Utilized 3 freeze/ thaw cycles achieving a 99mm frost ring around lesion.  He tolerated this well.  Post procedure instructions given.  ?

## 2022-02-27 ENCOUNTER — Ambulatory Visit: Payer: Medicare HMO | Admitting: Family Medicine

## 2022-02-28 ENCOUNTER — Other Ambulatory Visit: Payer: Self-pay | Admitting: Family Medicine

## 2022-03-06 ENCOUNTER — Ambulatory Visit: Payer: Medicare HMO | Admitting: Family Medicine

## 2022-03-15 ENCOUNTER — Other Ambulatory Visit: Payer: Self-pay

## 2022-03-15 MED ORDER — SERTRALINE HCL 50 MG PO TABS: 50.0000 mg | ORAL_TABLET | Freq: Every day | ORAL | 1 refills | Status: DC

## 2022-03-15 NOTE — Progress Notes (Signed)
LVM on patient's home Phone number to call the office back to get BP f/u with PCP scheduled for further medication refills. AMUCK

## 2022-03-15 NOTE — Progress Notes (Signed)
Pls contact pt to schedule follow-up appt for HTN. Sending 60 day med refill.

## 2022-03-20 ENCOUNTER — Ambulatory Visit (INDEPENDENT_AMBULATORY_CARE_PROVIDER_SITE_OTHER): Payer: Medicare HMO | Admitting: Family Medicine

## 2022-03-20 ENCOUNTER — Encounter: Payer: Self-pay | Admitting: Family Medicine

## 2022-03-20 VITALS — BP 130/76 | HR 73 | Ht 69.0 in | Wt 189.0 lb

## 2022-03-20 DIAGNOSIS — I10 Essential (primary) hypertension: Secondary | ICD-10-CM

## 2022-03-20 DIAGNOSIS — F341 Dysthymic disorder: Secondary | ICD-10-CM

## 2022-03-20 DIAGNOSIS — E785 Hyperlipidemia, unspecified: Secondary | ICD-10-CM | POA: Diagnosis not present

## 2022-03-20 DIAGNOSIS — I2510 Atherosclerotic heart disease of native coronary artery without angina pectoris: Secondary | ICD-10-CM | POA: Diagnosis not present

## 2022-03-20 DIAGNOSIS — R399 Unspecified symptoms and signs involving the genitourinary system: Secondary | ICD-10-CM | POA: Diagnosis not present

## 2022-03-20 DIAGNOSIS — N183 Chronic kidney disease, stage 3 unspecified: Secondary | ICD-10-CM

## 2022-03-21 LAB — CBC WITH DIFFERENTIAL/PLATELET
Absolute Monocytes: 511 cells/uL (ref 200–950)
Basophils Absolute: 58 cells/uL (ref 0–200)
Basophils Relative: 0.8 %
Eosinophils Absolute: 410 cells/uL (ref 15–500)
Eosinophils Relative: 5.7 %
HCT: 43.9 % (ref 38.5–50.0)
Hemoglobin: 15.2 g/dL (ref 13.2–17.1)
Lymphs Abs: 1642 cells/uL (ref 850–3900)
MCH: 31.9 pg (ref 27.0–33.0)
MCHC: 34.6 g/dL (ref 32.0–36.0)
MCV: 92.2 fL (ref 80.0–100.0)
MPV: 9.5 fL (ref 7.5–12.5)
Monocytes Relative: 7.1 %
Neutro Abs: 4579 cells/uL (ref 1500–7800)
Neutrophils Relative %: 63.6 %
Platelets: 215 10*3/uL (ref 140–400)
RBC: 4.76 10*6/uL (ref 4.20–5.80)
RDW: 11.9 % (ref 11.0–15.0)
Total Lymphocyte: 22.8 %
WBC: 7.2 10*3/uL (ref 3.8–10.8)

## 2022-03-21 LAB — COMPLETE METABOLIC PANEL WITH GFR
AG Ratio: 1.6 (calc) (ref 1.0–2.5)
ALT: 30 U/L (ref 9–46)
AST: 23 U/L (ref 10–35)
Albumin: 4.5 g/dL (ref 3.6–5.1)
Alkaline phosphatase (APISO): 93 U/L (ref 35–144)
BUN/Creatinine Ratio: 17 (calc) (ref 6–22)
BUN: 26 mg/dL — ABNORMAL HIGH (ref 7–25)
CO2: 25 mmol/L (ref 20–32)
Calcium: 9.9 mg/dL (ref 8.6–10.3)
Chloride: 103 mmol/L (ref 98–110)
Creat: 1.52 mg/dL — ABNORMAL HIGH (ref 0.70–1.28)
Globulin: 2.9 g/dL (calc) (ref 1.9–3.7)
Glucose, Bld: 89 mg/dL (ref 65–99)
Potassium: 5.2 mmol/L (ref 3.5–5.3)
Sodium: 139 mmol/L (ref 135–146)
Total Bilirubin: 0.7 mg/dL (ref 0.2–1.2)
Total Protein: 7.4 g/dL (ref 6.1–8.1)
eGFR: 47 mL/min/{1.73_m2} — ABNORMAL LOW (ref >=60)

## 2022-03-21 LAB — LIPID PANEL W/REFLEX DIRECT LDL
Cholesterol: 181 mg/dL (ref <200)
HDL: 56 mg/dL (ref >=40)
LDL Cholesterol (Calc): 99 mg/dL (calc)
Non-HDL Cholesterol (Calc): 125 mg/dL (calc) (ref <130)
Total CHOL/HDL Ratio: 3.2 (calc) (ref <5.0)
Triglycerides: 165 mg/dL — ABNORMAL HIGH (ref <150)

## 2022-03-21 LAB — TSH: TSH: 4.86 mIU/L — ABNORMAL HIGH (ref 0.40–4.50)

## 2022-03-21 LAB — PSA: PSA: 0.77 ng/mL (ref <=4.00)

## 2022-03-25 ENCOUNTER — Encounter: Payer: Self-pay | Admitting: Emergency Medicine

## 2022-03-25 ENCOUNTER — Ambulatory Visit
Admission: EM | Admit: 2022-03-25 | Discharge: 2022-03-25 | Disposition: A | Discharge status: Home / Self Care | Payer: Medicare HMO | Attending: Family Medicine | Admitting: Family Medicine

## 2022-03-25 DIAGNOSIS — H66011 Acute suppurative otitis media with spontaneous rupture of ear drum, right ear: Secondary | ICD-10-CM

## 2022-03-25 MED ORDER — CEFDINIR 300 MG PO CAPS: 300.0000 mg | ORAL_CAPSULE | Freq: Two times a day (BID) | ORAL | 0 refills | Status: DC

## 2022-03-25 MED ORDER — FLUTICASONE PROPIONATE 50 MCG/ACT NA SUSP: 2.0000 | Freq: Every day | NASAL | 0 refills | Status: DC

## 2022-03-25 NOTE — ED Triage Notes (Signed)
Patient c/o right ear drainage when he awoke this morning.  Over the past 2 weeks he had some dull pain in that ear.  Patient denies any OTC pain meds.

## 2022-03-25 NOTE — Assessment & Plan Note (Signed)
Blood pressure remains well controlled at this time.  Recommend continuation of low-sodium diet.

## 2022-03-25 NOTE — Assessment & Plan Note (Signed)
Tolerating atorvastatin well.  Continue current strength.  Updated labs ordered.

## 2022-03-25 NOTE — ED Provider Notes (Signed)
[name]    CSN: [id number] Arrival date & time: 03/25/22  1200      History   Chief Complaint Chief Complaint  Patient presents with   Ear Drainage    HPI Leonard Padilla is a 76 y.o. male.   HPI  Patient states he had some ear pressure and pain for a couple of days but it was not severe.  He then woke up this morning and had some drainage from his ear and noted on the pillowcase.  Only the right ear involved.  No loss of hearing.  No history of ear problems.  No cold or allergy symptoms  Past Medical History:  Diagnosis Date   Allergic conjunctivitis of left eye 10/16/2019   Aortic stenosis    moderate AS 12/01/15 echo (Dr. Elberta Fortis)   Arthritis    Bilateral leg edema 09/24/2017   Chronic constipation 09/03/2021   CKD (chronic kidney disease) stage 3, GFR 30-59 ml/min (HCC) 08/04/2016   CKD stage G3a/A1, GFR 45-59 and albumin creatinine ratio <30 mg/g (HCC) 08/04/2016   Colon cancer screening declined 05/27/2018   Combined form of age-related cataract, both eyes 08/12/2018   Coronary artery disease    Diastolic dysfunction without heart failure 08/19/2017   Dyslipidemia, goal LDL below 70 03/18/2017   Dysthymia 03/18/2017   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/07/2017   Mild, repeat in 5 years (2023)   Family history of prostate cancer in father 03/02/2019   Former smoker, stopped smoking in distant past    Heart murmur    History of colon polyps    History of colonic polyps 03/02/2019   Hyperlipidemia    Hypertension goal BP (blood pressure) < 140/90 10/13/2015   Hypertensive retinopathy of both eyes 08/12/2018   Intraoperative floppy iris syndrome (IFIS) 03/02/2021   Irritability and anger 03/18/2017   Lower urinary tract symptoms (LUTS) 04/29/2017   Posterior vitreous detachment, left eye 08/12/2018   Postural dizziness with presyncope 08/27/2016   Psoriasis    S/P AVR 09/09/2017   Edwards Lifesciences 23 mm, model 3300TFX, serial #[id number]     SBE  (subacute bacterial endocarditis) prophylaxis candidate 10/06/2018   Sleep apnea    to pick cpap tomorrow 12/13/15   Status post carotid endarterectomy 12/15/2015   Right sided B/l plaques <50% velocity (09/2016)   Status post CVA 08/03/2016   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   Stroke (HCC) 09/2015   Symptomatic carotid artery stenosis 12/15/2015   Urinary retention due to benign prostatic hyperplasia 09/13/2017   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   Wears glasses     Patient Active Problem List   Diagnosis Date Noted   Actinic keratosis 12/14/2021   Chronic constipation 09/03/2021   Intraoperative floppy iris syndrome (IFIS) 03/02/2021   Aortic stenosis    Arthritis    Coronary artery disease    Heart murmur    History of colon polyps    Hyperlipidemia    Sleep apnea    Wears glasses    Allergic conjunctivitis of left eye 10/16/2019   History of colonic polyps 03/02/2019   Family history of prostate cancer in father 03/02/2019   SBE (subacute bacterial endocarditis) prophylaxis candidate 10/06/2018   Combined form of age-related cataract, both eyes 08/12/2018   Hypertensive retinopathy of both eyes 08/12/2018   Posterior vitreous detachment, left eye 08/12/2018   Colon cancer screening declined 05/27/2018   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   Bilateral leg edema  09/24/2017   Urinary retention due to benign prostatic hyperplasia 09/13/2017   S/P AVR 09/09/2017   Diastolic dysfunction without heart failure 08/19/2017   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/07/2017   Lower urinary tract symptoms (LUTS) 04/29/2017   Former smoker, stopped smoking in distant past    Dysthymia 03/18/2017   Irritability and anger 03/18/2017   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   Dyslipidemia, goal LDL below 70 03/18/2017   Postural dizziness with presyncope 08/27/2016   CKD (chronic kidney disease) stage 3, GFR 30-59 ml/min (HCC)  08/04/2016   CKD stage G3a/A1, GFR 45-59 and albumin creatinine ratio <30 mg/g (HCC) 08/04/2016   Status post CVA 08/03/2016   Status post carotid endarterectomy 12/15/2015   Symptomatic carotid artery stenosis 12/15/2015   Hypertension goal BP (blood pressure) < 140/90 10/13/2015   Stroke (HCC) 09/2015   Psoriasis 12/02/2014    Past Surgical History:  Procedure Laterality Date   AORTIC VALVE REPLACEMENT N/A 09/09/2017   Procedure: AORTIC VALVE REPLACEMENT (AVR) using a 23 Edwards Perimount Magna Ease Aortic Valve;  Surgeon: Delight Ovens, MD;  Location: [facility] OR;  Service: Open Heart Surgery;  Laterality: N/A;   COLONOSCOPY W/ BIOPSIES AND POLYPECTOMY     CORONARY ARTERY BYPASS GRAFT N/A 09/09/2017   Procedure: CORONARY ARTERY BYPASS GRAFTING (CABG) x 4 using the left internal mammary artery and right greater saphenous vein harvested endoscopically.;  Surgeon: Delight Ovens, MD;  Location: [facility] OR;  Service: Open Heart Surgery;  Laterality: N/A;   ENDARTERECTOMY Right 12/15/2015   Procedure: RIGHT CAROTID ENDARTERECTOMY WITH PATCH ANGIOPLASTY;  Surgeon: Nada Libman, MD;  Location: [facility] OR;  Service: Vascular;  Laterality: Right;   EYE SURGERY Bilateral    Right 03/02/2021 Left 03/09/2021 Dr Hardie Shackleton   [facility] ANGIOPLASTY Right 12/15/2015   Procedure: RIGHT CAROTID PATCH ANGIOPLASTY;  Surgeon: Nada Libman, MD;  Location: [facility] OR;  Service: Vascular;  Laterality: Right;   RIGHT/LEFT HEART CATH AND CORONARY ANGIOGRAPHY N/A 08/23/2017   Procedure: RIGHT/LEFT HEART CATH AND CORONARY ANGIOGRAPHY;  Surgeon: Swaz[place], Peter M, MD;  Location: [facility] INVASIVE CV LAB;  Service: Cardiovascular;  Laterality: N/A;   TEE WITHOUT CARDIOVERSION N/A 09/09/2017   Procedure: TRANSESOPHAGEAL ECHOCARDIOGRAM (TEE);  Surgeon: Delight Ovens, MD;  Location: [facility] OR;  Service: Open Heart Surgery;  Laterality: N/A;   WISDOM TOOTH EXTRACTION         Home Medications    Prior to Admission medications    Medication Sig Start Date End Date Taking? Authorizing Provider  alfuzosin (UROXATRAL) 10 MG 24 hr tablet TAKE 1 TABLET(10 MG) BY MOUTH DAILY WITH BREAKFAST 02/28/22  Yes Everrett Coombe, DO  aspirin EC 81 MG tablet Take 1 tablet (81 mg total) by mouth daily. 08/22/17  Yes Revankar, Aundra Dubin, MD  atorvastatin (LIPITOR) 80 MG tablet Take 1 tablet (80 mg total) by mouth daily. 10/25/21  Yes Everrett Coombe, DO  cefdinir (OMNICEF) 300 MG capsule Take 1 capsule (300 mg total) by mouth 2 (two) times daily. 03/25/22  Yes Eustace Moore, MD  clobetasol cream (TEMOVATE) 0.05 % Apply 1 application topically as needed (Psoriasis).   Yes [provider]  FERROUS SULFATE PO Take 1 tablet by mouth daily.   Yes [provider]  fexofenadine-pseudoephedrine (ALLEGRA-D 24) 180-240 MG 24 hr tablet Take 1 tablet by mouth as needed (allergies).   Yes [provider]  fluticasone (FLONASE) 50 MCG/ACT nasal spray Place 2 sprays into both nostrils as needed for allergies or rhinitis.  Yes [provider]  fluticasone (FLONASE) 50 MCG/ACT nasal spray Place 2 sprays into both nostrils daily. 03/25/22  Yes Eustace Moore, MD  linaclotide [facility]) 145 MCG CAPS capsule TAKE 1 CAPSULE BY MOUTH DAILY BEFORE BREAKFAST 08/29/21  Yes Everrett Coombe, DO  nitroGLYCERIN (NITROLINGUAL) 0.4 MG/SPRAY spray Place 1 spray under the tongue every 5 (five) minutes x 3 doses as needed for chest pain.   Yes [provider]  sertraline (ZOLOFT) 50 MG tablet Take 1 tablet (50 mg total) by mouth daily. 03/15/22  Yes Everrett Coombe, DO    Family History Family History  Problem Relation Age of Onset   Cancer Mother        ovarian cancer   Cancer Father        Prostate    Social History Social History   Tobacco Use   Smoking status: Former    Types: Cigarettes    Quit date: 08/07/1983    Years since quitting: 38.6   Smokeless tobacco: Never  Vaping Use   Vaping Use: Never used  Substance  Use Topics   Alcohol use: Yes    Comment: 1 glass of wine daily    Drug use: No     Allergies   Viagra [sildenafil citrate] and Metoprolol   Review of Systems Review of Systems See HPI  Physical Exam Triage Vital Signs ED Triage Vitals  Enc Vitals Group     BP 03/25/22 1244 (!) 178/84     Pulse Rate 03/25/22 1244 (!) 57     Resp 03/25/22 1244 18     Temp 03/25/22 1244 98 F (36.7 C)     Temp Source 03/25/22 1244 Oral     SpO2 03/25/22 1244 97 %     Weight 03/25/22 1246 185 lb (83.9 kg)     Height 03/25/22 1246 5' 8.5" (1.74 m)     Head Circumference --      Peak Flow --      Pain Score 03/25/22 1246 1     Pain Loc --      Pain Edu? --      Excl. in GC? --    No data found.  Updated Vital Signs BP (!) 150/80 (BP Location: Left Arm)   Pulse 60   Temp 98 F (36.7 C) (Oral)   Resp 16   Ht 5' 8.5" (1.74 m)   Wt 83.9 kg   SpO2 98%   BMI 27.72 kg/m       Physical Exam Constitutional:      General: He is not in acute distress.    Appearance: He is well-developed.  HENT:     Head: Normocephalic and atraumatic.     Left Ear: Tympanic membrane, ear canal and external ear normal.     Ears:     Comments: Right canal slightly swollen.  Loaded with debris.  TM is partially visible, I can see about two thirds of it, it is dull and erythematous.  Do not see TM rupture    Nose: No congestion.     Mouth/Throat:     Pharynx: No posterior oropharyngeal erythema.  Eyes:     Conjunctiva/sclera: Conjunctivae normal.     Pupils: Pupils are equal, round, and reactive to light.  Cardiovascular:     Rate and Rhythm: Normal rate.  Pulmonary:     Effort: Pulmonary effort is normal. No respiratory distress.  Abdominal:     General: There is no distension.  Palpations: Abdomen is soft.  Musculoskeletal:        General: Normal range of motion.     Cervical back: Normal range of motion.  Lymphadenopathy:     Cervical: No cervical adenopathy.  Skin:    General: Skin is  warm and dry.  Neurological:     Mental Status: He is alert.  Psychiatric:        Mood and Affect: Mood normal.        Behavior: Behavior normal.      UC Treatments / Results  Labs (all labs ordered are listed, but only abnormal results are displayed) Labs Reviewed - No data to display  EKG   Radiology No results found.  Procedures Procedures (including critical care time)  Medications Ordered in UC Medications - No data to display  Initial Impression / Assessment and Plan / UC Course  I have reviewed the triage vital signs and the nursing notes.  Pertinent labs & imaging results that were available during my care of the patient were reviewed by me and considered in my medical decision making (see chart for details).     Blood pressure initially elevated to 178/84.  Repeat upon discharge was 150/80. With canal moisture and drainage from ear TM rupture is likely but not visualized. Follow-up with PCP recommended Final Clinical Impressions(s) / UC Diagnoses   Final diagnoses:  Non-recurrent acute suppurative otitis media of right ear with spontaneous rupture of tympanic membrane     Discharge Instructions      Use Flonase to help open the sinuses and eustachian tubes Drink lots of water Take antibiotic 2 times a day See your primary care doctor in follow-up     ED Prescriptions     Medication Sig Dispense Auth. Provider   cefdinir (OMNICEF) 300 MG capsule Take 1 capsule (300 mg total) by mouth 2 (two) times daily. 20 capsule Raylene Everts, MD   fluticasone [facility]) 50 MCG/ACT nasal spray Place 2 sprays into both nostrils daily. 16 g Raylene Everts, MD      PDMP not reviewed this encounter.   Raylene Everts, MD 03/25/22 [phone]

## 2022-03-25 NOTE — Assessment & Plan Note (Signed)
He continues to do well with sertraline at current strength.  We will continue.

## 2022-03-25 NOTE — Progress Notes (Signed)
Leonard Padilla - 76 y.o. male MRN [id number]  Date of birth: Nov 12, 1945  Subjective Chief Complaint  Patient presents with   Hypertension    HPI Leonard Padilla is a 76 year old male here today for follow-up visit.  Reports overall he is doing well.  He has wife recently returned from [place].  Unfortunately his wife had COVID.  He may have had a mild symptoms but overall did not feel too bad.  Continues to tolerate atorvastatin well for management of hyperlipidemia.  Due for updated labs.  Blood pressures remain well controlled without medication.  Denies chest pain, shortness of breath, palpitations, headaches or vision changes  Dysthymia is well controlled with sertraline at current strength.  ROS:  A comprehensive ROS was completed and negative except as noted per HPI      Allergies  Allergen Reactions   Viagra [Sildenafil Citrate] Other (See Comments)    COLOR DISCRIMINATION [DOSE RELATED] "blue sensation"   Metoprolol Other (See Comments)    Orthostasis, near syncope    Past Medical History:  Diagnosis Date   Allergic conjunctivitis of left eye 10/16/2019   Aortic stenosis    moderate AS 12/01/15 echo (Dr. Elberta Fortis)   Arthritis    Bilateral leg edema 09/24/2017   Chronic constipation 09/03/2021   CKD (chronic kidney disease) stage 3, GFR 30-59 ml/min (HCC) 08/04/2016   CKD stage G3a/A1, GFR 45-59 and albumin creatinine ratio <30 mg/g (HCC) 08/04/2016   Colon cancer screening declined 05/27/2018   Combined form of age-related cataract, both eyes 08/12/2018   Coronary artery disease    Diastolic dysfunction without heart failure 08/19/2017   Dyslipidemia, goal LDL below 70 03/18/2017   Dysthymia 03/18/2017   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/07/2017   Mild, repeat in 5 years (2023)   Family history of prostate cancer in father 03/02/2019   Former smoker, stopped smoking in distant past    Heart murmur    History of colon polyps    History of colonic polyps 03/02/2019    Hyperlipidemia    Hypertension goal BP (blood pressure) < 140/90 10/13/2015   Hypertensive retinopathy of both eyes 08/12/2018   Intraoperative floppy iris syndrome (IFIS) 03/02/2021   Irritability and anger 03/18/2017   Lower urinary tract symptoms (LUTS) 04/29/2017   Posterior vitreous detachment, left eye 08/12/2018   Postural dizziness with presyncope 08/27/2016   Psoriasis    S/P AVR 09/09/2017   Edwards Lifesciences 23 mm, model 3300TFX, serial #[id number]     SBE (subacute bacterial endocarditis) prophylaxis candidate 10/06/2018   Sleep apnea    to pick cpap tomorrow 12/13/15   Status post carotid endarterectomy 12/15/2015   Right sided B/l plaques <50% velocity (09/2016)   Status post CVA 08/03/2016   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   Stroke (HCC) 09/2015   Symptomatic carotid artery stenosis 12/15/2015   Urinary retention due to benign prostatic hyperplasia 09/13/2017   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   Wears glasses     Past Surgical History:  Procedure Laterality Date   AORTIC VALVE REPLACEMENT N/A 09/09/2017   Procedure: AORTIC VALVE REPLACEMENT (AVR) using a 23 Edwards Perimount Magna Ease Aortic Valve;  Surgeon: Delight Ovens, MD;  Location: [facility] OR;  Service: Open Heart Surgery;  Laterality: N/A;   COLONOSCOPY W/ BIOPSIES AND POLYPECTOMY     CORONARY ARTERY BYPASS GRAFT N/A 09/09/2017   Procedure: CORONARY ARTERY BYPASS GRAFTING (CABG) x 4 using the left internal mammary artery and right greater saphenous vein harvested  endoscopically.;  Surgeon: Grace Isaac, MD;  Location: [place];  Service: Open Heart Surgery;  Laterality: N/A;   ENDARTERECTOMY Right 12/15/2015   Procedure: RIGHT CAROTID ENDARTERECTOMY WITH PATCH ANGIOPLASTY;  Surgeon: Serafina Mitchell, MD;  Location: [place];  Service: Vascular;  Laterality: Right;   EYE SURGERY Bilateral    Right 03/02/2021 Left 03/09/2021 Dr Larose Kells   [facility] ANGIOPLASTY Right 12/15/2015    Procedure: RIGHT CAROTID PATCH ANGIOPLASTY;  Surgeon: Serafina Mitchell, MD;  Location: [place];  Service: Vascular;  Laterality: Right;   RIGHT/LEFT HEART CATH AND CORONARY ANGIOGRAPHY N/A 08/23/2017   Procedure: RIGHT/LEFT HEART CATH AND CORONARY ANGIOGRAPHY;  Surgeon: Martinique, Peter M, MD;  Location: [place] CV LAB;  Service: Cardiovascular;  Laterality: N/A;   TEE WITHOUT CARDIOVERSION N/A 09/09/2017   Procedure: TRANSESOPHAGEAL ECHOCARDIOGRAM (TEE);  Surgeon: Grace Isaac, MD;  Location: [place];  Service: Open Heart Surgery;  Laterality: N/A;   WISDOM TOOTH EXTRACTION      Social History   Socioeconomic History   Marital status: Married    Spouse name: Leonard Padilla   Number of children: 2   Years of education: 13   Highest education level: Not on file  Occupational History   Occupation: Retired    Comment: retired Nature conservation officer, [employer]  Tobacco Use   Smoking status: Former    Types: Cigarettes    Quit date: 08/07/1983    Years since quitting: 38.6   Smokeless tobacco: Never  Vaping Use   Vaping Use: Never used  Substance and Sexual Activity   Alcohol use: Yes    Comment: 1 glass of wine daily    Drug use: No   Sexual activity: Not Currently  Other Topics Concern   Not on file  Social History Narrative   Lives with wife   Caffeine use- coffee 3-4 cups daily   Social Determinants of Health   Financial Resource Strain: Not on file  Food Insecurity: Not on file  Transportation Needs: Not on file  Physical Activity: Not on file  Stress: Not on file  Social Connections: Not on file    Family History  Problem Relation Age of Onset   Cancer Mother        ovarian cancer   Cancer Father        Prostate    Health Maintenance  Topic Date Due   COVID-19 Vaccine (6 - Pfizer risk series) 08/06/2022 (Originally 06/09/2021)   INFLUENZA VACCINE  11/04/2022 (Originally 03/06/2022)   TETANUS/TDAP  08/06/2022   Pneumonia Vaccine 28+ Years old  Completed   Hepatitis C Screening   Completed   Zoster Vaccines- Shingrix  Completed   HPV VACCINES  Aged Out     ----------------------------------------------------------------------------------------------------------------------------------------------------------------------------------------------------------------- Physical Exam BP 130/76 (BP Location: Left Arm, Patient Position: Sitting, Cuff Size: Large)   Pulse 73   Ht 5\' 9"  (1.753 m)   Wt 189 lb (85.7 kg)   SpO2 98%   BMI 27.91 kg/m   Physical Exam Constitutional:      Appearance: Normal appearance.  Eyes:     General: No scleral icterus. Cardiovascular:     Rate and Rhythm: Normal rate and regular rhythm.  Pulmonary:     Effort: Pulmonary effort is normal.     Breath sounds: Normal breath sounds.  Musculoskeletal:     Cervical back: Neck supple.  Neurological:     Mental Status: He is alert.  Psychiatric:        Mood and Affect: Mood  normal.        Behavior: Behavior normal.     ------------------------------------------------------------------------------------------------------------------------------------------------------------------------------------------------------------------- Assessment and Plan  Hypertension goal BP (blood pressure) < 140/90 Blood pressure remains well controlled at this time.  Recommend continuation of low-sodium diet.  CKD (chronic kidney disease) stage 3, GFR 30-59 ml/min (HCC) Update renal function.  Dysthymia He continues to do well with sertraline at current strength.  We will continue.  Dyslipidemia, goal LDL below 70 Tolerating atorvastatin well.  Continue current strength.  Updated labs ordered.   No orders of the defined types were placed in this encounter.   Return in about 6 months (around 09/20/2022) for BP.    This visit occurred during the SARS-CoV-2 public health emergency.  Safety protocols were in place, including screening questions prior to the visit, additional usage of staff PPE,  and extensive cleaning of exam room while observing appropriate contact time as indicated for disinfecting solutions.

## 2022-03-25 NOTE — Assessment & Plan Note (Signed)
Update renal function.  

## 2022-03-25 NOTE — Discharge Instructions (Signed)
Use Flonase to help open the sinuses and eustachian tubes Drink lots of water Take antibiotic 2 times a day See your primary care doctor in follow-up

## 2022-03-28 ENCOUNTER — Encounter: Payer: Self-pay | Admitting: General Practice

## 2022-04-03 ENCOUNTER — Ambulatory Visit (INDEPENDENT_AMBULATORY_CARE_PROVIDER_SITE_OTHER): Payer: Medicare HMO | Admitting: Family Medicine

## 2022-04-03 ENCOUNTER — Encounter: Payer: Self-pay | Admitting: Family Medicine

## 2022-04-03 VITALS — BP 119/74 | HR 70 | Ht 68.5 in | Wt 186.0 lb

## 2022-04-03 DIAGNOSIS — H66001 Acute suppurative otitis media without spontaneous rupture of ear drum, right ear: Secondary | ICD-10-CM

## 2022-04-03 HISTORY — DX: Acute suppurative otitis media without spontaneous rupture of ear drum, right ear: H66.001

## 2022-04-03 MED ORDER — AMOXICILLIN-POT CLAVULANATE 875-125 MG PO TABS: 1.0000 | ORAL_TABLET | Freq: Two times a day (BID) | ORAL | 0 refills | Status: DC

## 2022-04-03 NOTE — Progress Notes (Signed)
Acute Office Visit  Subjective:     Patient ID: Leonard Padilla, male    DOB: [birth year], 76 y.o.   MRN: [id number]  Chief Complaint  Patient presents with   Ear Pain    right    HPI Patient is in today for follow up on urgent care visit where he was diagnosed with an ear infection. Per epic note, note is contradicting as to whether TM rupture was noted or not. Pt has been on 9/10 days of omnicef.   Review of Systems  Constitutional:  Negative for chills and fever.  HENT:  Positive for ear pain.   Respiratory:  Negative for cough and shortness of breath.   Cardiovascular:  Negative for chest pain.  Neurological:  Negative for headaches.        Objective:    BP 119/74   Pulse 70   Ht 5' 8.5" (1.74 m)   Wt 186 lb (84.4 kg)   SpO2 96%   BMI 27.87 kg/m    Physical Exam Vitals and nursing note reviewed.  Constitutional:      General: He is not in acute distress.    Appearance: Normal appearance.  HENT:     Head: Normocephalic and atraumatic.     Right Ear: External ear normal.     Left Ear: Tympanic membrane, ear canal and external ear normal.     Ears:     Comments: R ear with a lot of debris and erythematous. TM intact    Nose: Nose normal.  Eyes:     Conjunctiva/sclera: Conjunctivae normal.  Cardiovascular:     Rate and Rhythm: Normal rate and regular rhythm.  Pulmonary:     Effort: Pulmonary effort is normal.     Breath sounds: Normal breath sounds.  Neurological:     General: No focal deficit present.     Mental Status: He is alert and oriented to person, place, and time.  Psychiatric:        Mood and Affect: Mood normal.        Behavior: Behavior normal.        Thought Content: Thought content normal.        Judgment: Judgment normal.     No results found for any visits on 04/03/22.      Assessment & Plan:   Problem List Items Addressed This Visit       Nervous and Auditory   Non-recurrent acute suppurative otitis media of right ear  without spontaneous rupture of tympanic membrane - Primary    - did ask pt if TM was ruptured at urgent care visit and he said they told him it was not.  - TM intact on my exam today  - ear is still full of debris and erythema of TM and dullness  - have switched abx to augmentin for 10 days  - if no better may need to consider ENT referral for culture of organism if cefdinir and augmentin do not improve symptoms.       Relevant Medications   amoxicillin-clavulanate (AUGMENTIN) 875-125 MG tablet    Meds ordered this encounter  Medications   amoxicillin-clavulanate (AUGMENTIN) 875-125 MG tablet    Sig: Take 1 tablet by mouth 2 (two) times daily.    Dispense:  20 tablet    Refill:  0    Pt wanted flu shot today. Since he is still having ear pain have decided to hold off on vaccine at this moment. Did tell pt that  he can come back for a nurse visit or go to pharmacy once symptoms resolve for flu vaccine. He verbalized understanding.   Return if symptoms worsen or fail to improve.  Charlton Amor, DO

## 2022-04-03 NOTE — Assessment & Plan Note (Signed)
-   did ask pt if TM was ruptured at urgent care visit and he said they told him it was not.  - TM intact on my exam today  - ear is still full of debris and erythema of TM and dullness  - have switched abx to augmentin for 10 days  - if no better may need to consider ENT referral for culture of organism if cefdinir and augmentin do not improve symptoms.

## 2022-06-11 ENCOUNTER — Other Ambulatory Visit: Payer: Self-pay | Admitting: Family Medicine

## 2022-06-19 ENCOUNTER — Other Ambulatory Visit: Payer: Self-pay

## 2022-06-19 ENCOUNTER — Encounter: Payer: Self-pay | Admitting: Family Medicine

## 2022-06-19 ENCOUNTER — Ambulatory Visit
Admission: EM | Admit: 2022-06-19 | Discharge: 2022-06-19 | Disposition: A | Discharge status: Home / Self Care | Payer: Medicare HMO | Attending: Family Medicine | Admitting: Family Medicine

## 2022-06-19 ENCOUNTER — Encounter: Payer: Self-pay | Admitting: Emergency Medicine

## 2022-06-19 DIAGNOSIS — L03114 Cellulitis of left upper limb: Secondary | ICD-10-CM | POA: Diagnosis not present

## 2022-06-19 MED ORDER — PREDNISONE 20 MG PO TABS: ORAL_TABLET | ORAL | 0 refills | Status: DC

## 2022-06-19 MED ORDER — SERTRALINE HCL 50 MG PO TABS: 50.0000 mg | ORAL_TABLET | Freq: Every day | ORAL | 0 refills | Status: DC

## 2022-06-19 MED ORDER — DOXYCYCLINE HYCLATE 100 MG PO CAPS: 100.0000 mg | ORAL_CAPSULE | Freq: Two times a day (BID) | ORAL | 0 refills | Status: AC

## 2022-06-19 NOTE — ED Provider Notes (Signed)
[facility]    CSN: [id number] Arrival date & time: 06/19/22  [id number]      History   Chief Complaint Chief Complaint  Patient presents with   Cellulitis    HPI Leonard Padilla is a 76 y.o. male.   HPI Very pleasant 76 year old male presents with cellulitis from insect bite of left forearm yesterday.  Patient reports left forearm is red swollen hot to touch and painful.  PMH significant for CKD stage III, diastolic dysfunction without heart failure, and stroke.  Past Medical History:  Diagnosis Date   Allergic conjunctivitis of left eye 10/16/2019   Aortic stenosis    moderate AS 12/01/15 echo (Dr. Elberta Fortisamnitz)   Arthritis    Bilateral leg edema 09/24/2017   Chronic constipation 09/03/2021   CKD (chronic kidney disease) stage 3, GFR 30-59 ml/min (HCC) 08/04/2016   CKD stage G3a/A1, GFR 45-59 and albumin creatinine ratio <30 mg/g (HCC) 08/04/2016   Colon cancer screening declined 05/27/2018   Combined form of age-related cataract, both eyes 08/12/2018   Coronary artery disease    Diastolic dysfunction without heart failure 08/19/2017   Dyslipidemia, goal LDL below 70 03/18/2017   Dysthymia 03/18/2017   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/07/2017   Mild, repeat in 5 years (2023)   Family history of prostate cancer in father 03/02/2019   Former smoker, stopped smoking in distant past    Heart murmur    History of colon polyps    History of colonic polyps 03/02/2019   Hyperlipidemia    Hypertension goal BP (blood pressure) < 140/90 10/13/2015   Hypertensive retinopathy of both eyes 08/12/2018   Intraoperative floppy iris syndrome (IFIS) 03/02/2021   Irritability and anger 03/18/2017   Lower urinary tract symptoms (LUTS) 04/29/2017   Posterior vitreous detachment, left eye 08/12/2018   Postural dizziness with presyncope 08/27/2016   Psoriasis    S/P AVR 09/09/2017   Edwards Lifesciences 23 mm, model 3300TFX, serial #[id number]     SBE (subacute bacterial endocarditis)  prophylaxis candidate 10/06/2018   Sleep apnea    to pick cpap tomorrow 12/13/15   Status post carotid endarterectomy 12/15/2015   Right sided B/l plaques <50% velocity (09/2016)   Status post CVA 08/03/2016   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   Stroke (HCC) 09/2015   Symptomatic carotid artery stenosis 12/15/2015   Urinary retention due to benign prostatic hyperplasia 09/13/2017   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   Wears glasses     Patient Active Problem List   Diagnosis Date Noted   Non-recurrent acute suppurative otitis media of right ear without spontaneous rupture of tympanic membrane 04/03/2022   Actinic keratosis 12/14/2021   Chronic constipation 09/03/2021   Intraoperative floppy iris syndrome (IFIS) 03/02/2021   Aortic stenosis    Arthritis    Coronary artery disease    Heart murmur    History of colon polyps    Hyperlipidemia    Sleep apnea    Wears glasses    Allergic conjunctivitis of left eye 10/16/2019   History of colonic polyps 03/02/2019   Family history of prostate cancer in father 03/02/2019   SBE (subacute bacterial endocarditis) prophylaxis candidate 10/06/2018   Combined form of age-related cataract, both eyes 08/12/2018   Hypertensive retinopathy of both eyes 08/12/2018   Posterior vitreous detachment, left eye 08/12/2018   Colon cancer screening declined 05/27/2018   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   Bilateral leg edema 09/24/2017  Urinary retention due to benign prostatic hyperplasia 09/13/2017   S/P AVR 09/09/2017   Diastolic dysfunction without heart failure 08/19/2017   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/07/2017   Lower urinary tract symptoms (LUTS) 04/29/2017   Former smoker, stopped smoking in distant past    Dysthymia 03/18/2017   Irritability and anger 03/18/2017   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   Dyslipidemia, goal LDL below 70 03/18/2017   Postural dizziness with  presyncope 08/27/2016   CKD (chronic kidney disease) stage 3, GFR 30-59 ml/min (HCC) 08/04/2016   CKD stage G3a/A1, GFR 45-59 and albumin creatinine ratio <30 mg/g (HCC) 08/04/2016   Status post CVA 08/03/2016   Status post carotid endarterectomy 12/15/2015   Symptomatic carotid artery stenosis 12/15/2015   Hypertension goal BP (blood pressure) < 140/90 10/13/2015   Stroke (HCC) 09/2015   Psoriasis 12/02/2014    Past Surgical History:  Procedure Laterality Date   AORTIC VALVE REPLACEMENT N/A 09/09/2017   Procedure: AORTIC VALVE REPLACEMENT (AVR) using a 23 Edwards Perimount Magna Ease Aortic Valve;  Surgeon: Delight Ovens, MD;  Location: [facility] OR;  Service: Open Heart Surgery;  Laterality: N/A;   COLONOSCOPY W/ BIOPSIES AND POLYPECTOMY     CORONARY ARTERY BYPASS GRAFT N/A 09/09/2017   Procedure: CORONARY ARTERY BYPASS GRAFTING (CABG) x 4 using the left internal mammary artery and right greater saphenous vein harvested endoscopically.;  Surgeon: Delight Ovens, MD;  Location: [facility] OR;  Service: Open Heart Surgery;  Laterality: N/A;   ENDARTERECTOMY Right 12/15/2015   Procedure: RIGHT CAROTID ENDARTERECTOMY WITH PATCH ANGIOPLASTY;  Surgeon: Nada Libman, MD;  Location: [facility] OR;  Service: Vascular;  Laterality: Right;   EYE SURGERY Bilateral    Right 03/02/2021 Left 03/09/2021 Dr Hardie Shackleton   [facility] ANGIOPLASTY Right 12/15/2015   Procedure: RIGHT CAROTID PATCH ANGIOPLASTY;  Surgeon: Nada Libman, MD;  Location: [facility] OR;  Service: Vascular;  Laterality: Right;   RIGHT/LEFT HEART CATH AND CORONARY ANGIOGRAPHY N/A 08/23/2017   Procedure: RIGHT/LEFT HEART CATH AND CORONARY ANGIOGRAPHY;  Surgeon: [place], Peter M, MD;  Location: [facility] INVASIVE CV LAB;  Service: Cardiovascular;  Laterality: N/A;   TEE WITHOUT CARDIOVERSION N/A 09/09/2017   Procedure: TRANSESOPHAGEAL ECHOCARDIOGRAM (TEE);  Surgeon: Delight Ovens, MD;  Location: [facility] OR;  Service: Open Heart Surgery;  Laterality: N/A;   WISDOM TOOTH  EXTRACTION         Home Medications    Prior to Admission medications   Medication Sig Start Date End Date Taking? Authorizing Provider  doxycycline (VIBRAMYCIN) 100 MG capsule Take 1 capsule (100 mg total) by mouth 2 (two) times daily for 10 days. 06/19/22 06/29/22 Yes Trevor Iha, FNP  predniSONE (DELTASONE) 20 MG tablet Take 3 tabs PO daily x 5 days. 06/19/22  Yes Trevor Iha, FNP  alfuzosin (UROXATRAL) 10 MG 24 hr tablet TAKE 1 TABLET(10 MG) BY MOUTH DAILY WITH BREAKFAST 02/28/22   Everrett Coombe, DO  aspirin EC 81 MG tablet Take 1 tablet (81 mg total) by mouth daily. 08/22/17   Revankar, Aundra Dubin, MD  atorvastatin (LIPITOR) 80 MG tablet Take 1 tablet (80 mg total) by mouth daily. 10/25/21   Everrett Coombe, DO  clobetasol cream (TEMOVATE) 0.05 % Apply 1 application topically as needed (Psoriasis).    [provider]  FERROUS SULFATE PO Take 1 tablet by mouth daily.    [provider]  fexofenadine-pseudoephedrine (ALLEGRA-D 24) 180-240 MG 24 hr tablet Take 1 tablet by mouth as needed (allergies).    [provider]  fluticasone (FLONASE) 50 MCG/ACT nasal spray Place 2 sprays into both nostrils as needed for allergies or rhinitis.    [provider]  fluticasone (FLONASE) 50 MCG/ACT nasal spray Place 2 sprays into both nostrils daily. 03/25/22   Eustace Moore, MD  linaclotide [facility]) 145 MCG CAPS capsule TAKE 1 CAPSULE BY MOUTH DAILY BEFORE BREAKFAST 08/29/21   Everrett Coombe, DO  nitroGLYCERIN (NITROLINGUAL) 0.4 MG/SPRAY spray Place 1 spray under the tongue every 5 (five) minutes x 3 doses as needed for chest pain.    [provider]  sertraline (ZOLOFT) 50 MG tablet TAKE 1 TABLET BY MOUTH EVERY DAY 06/11/22   Everrett Coombe, DO    Family History Family History  Problem Relation Age of Onset   Cancer Mother        ovarian cancer   Cancer Father        Prostate    Social History Social History   Tobacco Use   Smoking status:  Former    Types: Cigarettes    Quit date: 08/07/1983    Years since quitting: 38.8   Smokeless tobacco: Never  Vaping Use   Vaping Use: Never used  Substance Use Topics   Alcohol use: Yes    Comment: 1 glass of wine daily    Drug use: No     Allergies   Viagra [sildenafil citrate] and Metoprolol   Review of Systems Review of Systems  Skin:  Positive for rash.     Physical Exam Triage Vital Signs ED Triage Vitals  Enc Vitals Group     BP 06/19/22 1010 133/81     Pulse Rate 06/19/22 1010 71     Resp 06/19/22 1010 16     Temp 06/19/22 1010 98.2 F (36.8 C)     Temp Source 06/19/22 1010 Oral     SpO2 06/19/22 1010 97 %     Weight 06/19/22 1011 183 lb (83 kg)     Height 06/19/22 1011 5\' 8"  (1.727 m)     Head Circumference --      Peak Flow --      Pain Score 06/19/22 1010 2     Pain Loc --      Pain Edu? --      Excl. in GC? --    No data found.  Updated Vital Signs BP 133/81 (BP Location: Left Arm)   Pulse 71   Temp 98.2 F (36.8 C) (Oral)   Resp 16   Ht 5\' 8"  (1.727 m)   Wt 183 lb (83 kg)   SpO2 97%   BMI 27.83 kg/m      Physical Exam Vitals and nursing note reviewed.  Constitutional:      Appearance: Normal appearance. He is normal weight.  HENT:     Head: Normocephalic and atraumatic.     Mouth/Throat:     Mouth: Mucous membranes are moist.     Pharynx: Oropharynx is clear.  Eyes:     Extraocular Movements: Extraocular movements intact.     Conjunctiva/sclera: Conjunctivae normal.     Pupils: Pupils are equal, round, and reactive to light.  Cardiovascular:     Rate and Rhythm: Normal rate and regular rhythm.     Pulses: Normal pulses.     Heart sounds: Normal heart sounds.  Pulmonary:     Effort: Pulmonary effort is normal.     Breath sounds: Normal breath sounds. No wheezing, rhonchi or rales.  Musculoskeletal:  General: Normal range of motion.     Cervical back: Normal range of motion and neck supple.     Comments: Left lower arm  (anterior distal aspect): erythematous mildly indurated area with mild soft tissue swelling noted-please see image below  Skin:    General: Skin is warm and dry.  Neurological:     General: No focal deficit present.     Mental Status: He is alert and oriented to person, place, and time.      UC Treatments / Results  Labs (all labs ordered are listed, but only abnormal results are displayed) Labs Reviewed - No data to display  EKG   Radiology No results found.  Procedures Procedures (including critical care time)  Medications Ordered in UC Medications - No data to display  Initial Impression / Assessment and Plan / UC Course  I have reviewed the triage vital signs and the nursing notes.  Pertinent labs & imaging results that were available during my care of the patient were reviewed by me and considered in my medical decision making (see chart for details).     MDM: 1.  Cellulitis of arm, left-Rx'd doxycycline, prednisone. Advised patient to take medications as directed with food to completion.  Instructed patient to take Prednisone with first dose of Doxycycline for the next 5 of 10 days.  Encouraged patient to increase daily water intake to 64 ounces per day while taking these medications.  Advised if symptoms worsen and/or unresolved please follow-up with PCP or here for further evaluation.  Patient discharged home, hemodynamically stable.   Final Clinical Impressions(s) / UC Diagnoses   Final diagnoses:  Cellulitis of arm, left     Discharge Instructions      Advised patient to take medications as directed with food to completion.  Instructed patient to take Prednisone with first dose of Doxycycline for the next 5 of 10 days.  Encouraged patient to increase daily water intake to 64 ounces per day while taking these medications.  Advised if symptoms worsen and/or unresolved please follow-up with PCP or here for further evaluation.     ED Prescriptions      Medication Sig Dispense Auth. Provider   doxycycline (VIBRAMYCIN) 100 MG capsule Take 1 capsule (100 mg total) by mouth 2 (two) times daily for 10 days. 20 capsule Trevor Iha, FNP   predniSONE (DELTASONE) 20 MG tablet Take 3 tabs PO daily x 5 days. 15 tablet Trevor Iha, FNP      PDMP not reviewed this encounter.   Trevor Iha, FNP 06/19/22 1101

## 2022-06-19 NOTE — Discharge Instructions (Addendum)
Advised patient to take medications as directed with food to completion.  Instructed patient to take Prednisone with first dose of Doxycycline for the next 5 of 10 days.  Encouraged patient to increase daily water intake to 64 ounces per day while taking these medications.  Advised if symptoms worsen and/or unresolved please follow-up with PCP or here for further evaluation.

## 2022-06-19 NOTE — ED Triage Notes (Signed)
Cellulitis from insect bite on left forearm yesterday. Patient did not see what bite him. Fore arm is red, swollen, hot to the touch and painful. Denies nausea, fever, chills.

## 2022-07-04 ENCOUNTER — Telehealth: Payer: Medicare HMO | Admitting: Physician Assistant

## 2022-07-04 DIAGNOSIS — U071 COVID-19: Secondary | ICD-10-CM

## 2022-07-04 MED ORDER — MOLNUPIRAVIR EUA 200MG CAPSULE: 4.0000 | ORAL_CAPSULE | Freq: Two times a day (BID) | ORAL | 0 refills | Status: AC

## 2022-07-04 MED ORDER — PROMETHAZINE-DM 6.25-15 MG/5ML PO SYRP: 5.0000 mL | ORAL_SOLUTION | Freq: Four times a day (QID) | ORAL | 0 refills | Status: DC | PRN

## 2022-07-04 NOTE — Progress Notes (Signed)
   Thank you for the details you included in the comment boxes. Those details are very helpful in determining the best course of treatment for you and help [place] to provide the best care.Because of your health history, you are at higher risk of complications and would be likely candidate for antivirals which we cannot give through e-visit. As such, we recommend that you convert this visit to a video visit in order for the provider to better assess what is going on.  The provider will be able to give you a more accurate diagnosis and treatment plan if we can more freely discuss your symptoms and with the addition of a virtual examination.   If you convert to a video visit, we will bill your insurance (similar to an office visit) and you will not be charged for this e-Visit. You will be able to stay at home and speak with the first available [facility] advanced practice provider. The link to do a video visit is in the drop down Menu tab of your Welcome screen in MyChart.

## 2022-07-04 NOTE — Patient Instructions (Signed)
Bertha Stakes, thank you for joining Margaretann Loveless, PA-C for today's virtual visit.  While this provider is not your primary care provider (PCP), if your PCP is located in our provider database this encounter information will be shared with them immediately following your visit.   A [facility] MyChart account gives you access to today's visit and all your visits, tests, and labs performed at [facility] " click here if you don't have a  MyChart account or go to [email or web address]  Consent: (Patient) JHORDAN MCKIBBEN provided verbal consent for this virtual visit at the beginning of the encounter.  Current Medications:  Current Outpatient Medications:    molnupiravir EUA (LAGEVRIO) 200 mg CAPS capsule, Take 4 capsules (800 mg total) by mouth 2 (two) times daily for 5 days., Disp: 40 capsule, Rfl: 0   promethazine-dextromethorphan (PROMETHAZINE-DM) 6.25-15 MG/5ML syrup, Take 5 mLs by mouth 4 (four) times daily as needed., Disp: 118 mL, Rfl: 0   alfuzosin (UROXATRAL) 10 MG 24 hr tablet, TAKE 1 TABLET(10 MG) BY MOUTH DAILY WITH BREAKFAST, Disp: 90 tablet, Rfl: 1   aspirin EC 81 MG tablet, Take 1 tablet (81 mg total) by mouth daily., Disp: 90 tablet, Rfl: 1   atorvastatin (LIPITOR) 80 MG tablet, Take 1 tablet (80 mg total) by mouth daily., Disp: 90 tablet, Rfl: 3   clobetasol cream (TEMOVATE) 0.05 %, Apply 1 application topically as needed (Psoriasis)., Disp: , Rfl:    FERROUS SULFATE PO, Take 1 tablet by mouth daily., Disp: , Rfl:    fexofenadine-pseudoephedrine (ALLEGRA-D 24) 180-240 MG 24 hr tablet, Take 1 tablet by mouth as needed (allergies)., Disp: , Rfl:    fluticasone (FLONASE) 50 MCG/ACT nasal spray, Place 2 sprays into both nostrils as needed for allergies or rhinitis., Disp: , Rfl:    fluticasone (FLONASE) 50 MCG/ACT nasal spray, Place 2 sprays into both nostrils daily., Disp: 16 g, Rfl: 0   linaclotide (LINZESS) 145 MCG CAPS capsule, TAKE 1 CAPSULE BY  MOUTH DAILY BEFORE BREAKFAST, Disp: 90 capsule, Rfl: 3   nitroGLYCERIN (NITROLINGUAL) 0.4 MG/SPRAY spray, Place 1 spray under the tongue every 5 (five) minutes x 3 doses as needed for chest pain., Disp: , Rfl:    predniSONE (DELTASONE) 20 MG tablet, Take 3 tabs PO daily x 5 days., Disp: 15 tablet, Rfl: 0   sertraline (ZOLOFT) 50 MG tablet, Take 1 tablet (50 mg total) by mouth daily., Disp: 90 tablet, Rfl: 0   Medications ordered in this encounter:  Meds ordered this encounter  Medications   molnupiravir EUA (LAGEVRIO) 200 mg CAPS capsule    Sig: Take 4 capsules (800 mg total) by mouth 2 (two) times daily for 5 days.    Dispense:  40 capsule    Refill:  0    Order Specific Question:   Supervising Provider    Answer:   Merrilee Jansky [id number]   promethazine-dextromethorphan (PROMETHAZINE-DM) 6.25-15 MG/5ML syrup    Sig: Take 5 mLs by mouth 4 (four) times daily as needed.    Dispense:  118 mL    Refill:  0    Order Specific Question:   Supervising Provider    Answer:   Merrilee Jansky [[id number]]     *If you need refills on other medications prior to your next appointment, please contact your pharmacy*  Follow-Up: Call back or seek an in-person evaluation if the symptoms worsen or if the condition fails to improve as anticipated.   Virtual Care [phone])  [phone]  Other Instructions  COVID-19 COVID-19, or coronavirus disease 2019, is an infection that is caused by a new (novel) coronavirus called SARS-CoV-2. COVID-19 can cause many symptoms. In some people, the virus may not cause any symptoms. In others, it may cause mild or severe symptoms. Some people with severe infection develop severe disease. What are the causes? This illness is caused by a virus. The virus may be in the air as tiny specks of fluid (aerosols) or droplets, or it may be on surfaces. You may catch the virus by: Breathing in droplets from an infected person. Droplets can be spread by a person breathing,  speaking, singing, coughing, or sneezing. Touching something, like a table or a doorknob, that has virus on it (is contaminated) and then touching your mouth, nose, or eyes. What increases the risk? Risk for infection: You are more likely to get infected with the COVID-19 virus if: You are within 6 ft (1.8 m) of a person with COVID-19 for 15 minutes or longer. You are providing care for a person who is infected with COVID-19. You are in close personal contact with other people. Close personal contact includes hugging, kissing, or sharing eating or drinking utensils. Risk for serious illness caused by COVID-19: You are more likely to get seriously ill from the COVID-19 virus if: You have cancer. You have a long-term (chronic) disease, such as: Chronic lung disease. This includes pulmonary embolism, chronic obstructive pulmonary disease, and cystic fibrosis. Long-term disease that lowers your body's ability to fight infection (immunocompromise). Serious cardiac conditions, such as heart failure, coronary artery disease, or cardiomyopathy. Diabetes. Chronic kidney disease. Liver diseases. These include cirrhosis, nonalcoholic fatty liver disease, alcoholic liver disease, or autoimmune hepatitis. You have obesity. You are pregnant or were recently pregnant. You have sickle cell disease. What are the signs or symptoms? Symptoms of this condition can range from mild to severe. Symptoms may appear any time from 2 to 14 days after being exposed to the virus. They include: Fever or chills. Shortness of breath or trouble breathing. Feeling tired or very tired. Headaches, body aches, or muscle aches. Runny or stuffy nose, sneezing, coughing, or sore throat. New loss of taste or smell. This is rare. Some people may also have stomach problems, such as nausea, vomiting, or diarrhea. Other people may not have any symptoms of COVID-19. How is this diagnosed? This condition may be diagnosed by testing  samples to check for the COVID-19 virus. The most common tests are the PCR test and the antigen test. Tests may be done in the lab or at home. They include: Using a swab to take a sample of fluid from the back of your nose and throat (nasopharyngeal fluid), from your nose, or from your throat. Testing a sample of saliva from your mouth. Testing a sample of coughed-up mucus from your lungs (sputum). How is this treated? Treatment for COVID-19 infection depends on the severity of the condition. Mild symptoms can be managed at home with rest, fluids, and over-the-counter medicines. Serious symptoms may be treated in a hospital intensive care unit (ICU). Treatment in the ICU may include: Supplemental oxygen. Extra oxygen is given through a tube in the nose, a face mask, or a hood. Medicines. These may include: Antivirals, such as monoclonal antibodies. These help your body fight off certain viruses that can cause disease. Anti-inflammatories, such as corticosteroids. These reduce inflammation and suppress the immune system. Antithrombotics. These prevent or treat blood clots, if they develop. Convalescent plasma. This  helps boost your immune system, if you have an underlying immunosuppressive condition or are getting immunosuppressive treatments. Prone positioning. This means you will lie on your stomach. This helps oxygen to get into your lungs. Infection control measures. If you are at risk for more serious illness caused by COVID-19, your health care provider may prescribe two long-acting monoclonal antibodies, given together every 6 months. How is this prevented? To protect yourself: Use preventive medicine (pre-exposure prophylaxis). You may get pre-exposure prophylaxis if you have moderate or severe immunocompromise. Get vaccinated. Anyone 23 months old or older who meets guidelines can get a COVID-19 vaccine or vaccine series. This includes people who are pregnant or making breast milk  (lactating). Get an added dose of COVID-19 vaccine after your first vaccine or vaccine series if you have moderate to severe immunocompromise. This applies if you have had a solid organ transplant or have been diagnosed with an immunocompromising condition. You should get the added dose 4 weeks after you got the first COVID-19 vaccine or vaccine series. If you get an mRNA vaccine, you will need a 3-dose primary series. If you get the J&J/Janssen vaccine, you will need a 2-dose primary series, with the second dose being an mRNA vaccine. Talk to your health care provider about getting experimental monoclonal antibodies. This treatment is approved under emergency use authorization to prevent severe illness before or after being exposed to the COVID-19 virus. You may be given monoclonal antibodies if: You have moderate or severe immunocompromise. This includes treatments that lower your immune response. People with immunocompromise may not develop protection against COVID-19 when they are vaccinated. You cannot be vaccinated. You may not get a vaccine if you have a severe allergic reaction to the vaccine or its components. You are not fully vaccinated. You are in a facility where COVID-19 is present and: Are in close contact with a person who is infected with the COVID-19 virus. Are at high risk of being exposed to the COVID-19 virus. You are at risk of illness from new variants of the COVID-19 virus. To protect others: If you have symptoms of COVID-19, take steps to prevent the virus from spreading to others. Stay home. Leave your house only to get medical care. Do not use public transit, if possible. Do not travel while you are sick. Wash your hands often with soap and water for at least 20 seconds. If soap and water are not available, use alcohol-based hand sanitizer. Make sure that all people in your household wash their hands well and often. Cough or sneeze into a tissue or your sleeve or elbow.  Do not cough or sneeze into your hand or into the air. Where to find more information Centers for Disease Control and Prevention: https://www.clark-whitaker.org/ World Health Organization: https://thompson-craig.com/ Get help right away if: You have trouble breathing. You have pain or pressure in your chest. You are confused. You have bluish lips and fingernails. You have trouble waking from sleep. You have symptoms that get worse. These symptoms may be an emergency. Get help right away. Call 911. Do not wait to see if the symptoms will go away. Do not drive yourself to the hospital. Summary COVID-19 is an infection that is caused by a new coronavirus. Sometimes, there are no symptoms. Other times, symptoms range from mild to severe. Some people with a severe COVID-19 infection develop severe disease. The virus that causes COVID-19 can spread from person to person through droplets or aerosols from breathing, speaking, singing, coughing, or sneezing. Mild  symptoms of COVID-19 can be managed at home with rest, fluids, and over-the-counter medicines. This information is not intended to replace advice given to you by your health care provider. Make sure you discuss any questions you have with your health care provider. Document Revised: 07/11/2021 Document Reviewed: 07/13/2021 Elsevier Patient Education  2023 Elsevier Inc.    If you have been instructed to have an in-person evaluation today at a local Urgent Care facility, please use the link below. It will take you to a list of all of our available [facility], including address, phone number and hours of operation. Please do not delay care.  [facility]  If you or a family member do not have a primary care provider, use the link below to schedule a visit and establish care. When you choose a [facility] primary care physician or advanced practice provider, you gain a long-term partner in health. Find a Primary  Care Provider  Learn more about [facility]'s in-office and virtual care options:  - Get Care Now

## 2022-07-04 NOTE — Progress Notes (Signed)
Virtual Visit Consent   Leonard Padilla, you are scheduled for a virtual visit with a [facility] provider today. Just as with appointments in the office, your consent must be obtained to participate. Your consent will be active for this visit and any virtual visit you may have with one of our providers in the next 365 days. If you have a MyChart account, a copy of this consent can be sent to you electronically.  As this is a virtual visit, video technology does not allow for your provider to perform a traditional examination. This may limit your provider's ability to fully assess your condition. If your provider identifies any concerns that need to be evaluated in person or the need to arrange testing (such as labs, EKG, etc.), we will make arrangements to do so. Although advances in technology are sophisticated, we cannot ensure that it will always work on either your end or our end. If the connection with a video visit is poor, the visit may have to be switched to a telephone visit. With either a video or telephone visit, we are not always able to ensure that we have a secure connection.  By engaging in this virtual visit, you consent to the provision of healthcare and authorize for your insurance to be billed (if applicable) for the services provided during this visit. Depending on your insurance coverage, you may receive a charge related to this service.  I need to obtain your verbal consent now. Are you willing to proceed with your visit today? BONIFACE ROTUNNO has provided verbal consent on 07/04/2022 for a virtual visit (video or telephone). Mar Daring, PA-C  Date: 07/04/2022 6:00 PM  Virtual Visit via Video Note   I, Mar Daring, connected with  Leonard Padilla  ([id number], 1946-01-22) on 07/04/22 at  5:45 PM EST by a video-enabled telemedicine application and verified that I am speaking with the correct person using two identifiers.  Location: Patient: Virtual Visit Location  Patient: Home Provider: Virtual Visit Location Provider: Home Office   I discussed the limitations of evaluation and management by telemedicine and the availability of in person appointments. The patient expressed understanding and agreed to proceed.    History of Present Illness: Leonard Padilla is a 76 y.o. who identifies as a male who was assigned male at birth, and is being seen today for Covid 65.  HPI: URI  This is a new problem. The current episode started yesterday (Tested positive for covid 19 on at home test; symptoms started yesterday). The problem has been gradually worsening. The maximum temperature recorded prior to his arrival was 100.4 - 100.9 F. The fever has been present for Less than 1 day. Associated symptoms include congestion, coughing, headaches, rhinorrhea, sinus pain and a sore throat. Pertinent negatives include no diarrhea, ear pain, nausea, plugged ear sensation or vomiting. Treatments tried: advil dual, mucinex. The treatment provided no relief.     Problems:  Patient Active Problem List   Diagnosis Date Noted   Non-recurrent acute suppurative otitis media of right ear without spontaneous rupture of tympanic membrane 04/03/2022   Actinic keratosis 12/14/2021   Chronic constipation 09/03/2021   Intraoperative floppy iris syndrome (IFIS) 03/02/2021   Aortic stenosis    Arthritis    Coronary artery disease    Heart murmur    History of colon polyps    Hyperlipidemia    Sleep apnea    Wears glasses    Allergic conjunctivitis of left eye 10/16/2019  History of colonic polyps 03/02/2019   Family history of prostate cancer in father 03/02/2019   SBE (subacute bacterial endocarditis) prophylaxis candidate 10/06/2018   Combined form of age-related cataract, both eyes 08/12/2018   Hypertensive retinopathy of both eyes 08/12/2018   Posterior vitreous detachment, left eye 08/12/2018   Colon cancer screening declined 05/27/2018   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   Dyslipidemia, goal LDL below 70 03/18/2017   Postural dizziness with presyncope 08/27/2016   CKD (chronic kidney disease) stage 3, GFR 30-59 ml/min (HCC) 08/04/2016   CKD stage G3a/A1, GFR 45-59 and albumin creatinine ratio <30 mg/g ([place]) 08/04/2016   Status post CVA 08/03/2016   Status post carotid endarterectomy 12/15/2015   Symptomatic carotid artery stenosis 12/15/2015   Hypertension goal BP (blood pressure) < 140/90 10/13/2015   Stroke ([place]) 09/2015   Psoriasis 12/02/2014    Allergies:  Allergies  Allergen Reactions   Viagra [Sildenafil Citrate] Other (See Comments)    COLOR DISCRIMINATION [DOSE RELATED] "blue sensation"   Metoprolol Other (See Comments)    Orthostasis, near syncope   Medications:  Current Outpatient Medications:    molnupiravir EUA (LAGEVRIO) 200 mg CAPS capsule, Take 4 capsules (800 mg total) by mouth 2 (two) times daily for 5 days., Disp: 40 capsule, Rfl: 0   promethazine-dextromethorphan (PROMETHAZINE-DM) 6.25-15 MG/5ML syrup, Take 5 mLs by mouth 4 (four) times daily as needed., Disp: 118 mL, Rfl: 0   alfuzosin (UROXATRAL) 10 MG 24 hr tablet, TAKE 1 TABLET(10 MG) BY MOUTH DAILY WITH BREAKFAST, Disp: 90 tablet, Rfl: 1   aspirin EC 81 MG tablet, Take 1 tablet (81 mg total) by mouth daily., Disp: 90 tablet, Rfl: 1   atorvastatin (LIPITOR) 80 MG tablet, Take 1 tablet (80 mg total) by mouth daily., Disp: 90 tablet, Rfl: 3   clobetasol cream (TEMOVATE) [id number] %, Apply 1 application topically as needed (Psoriasis).,  Disp: , Rfl:    FERROUS SULFATE PO, Take 1 tablet by mouth daily., Disp: , Rfl:    fexofenadine-pseudoephedrine (ALLEGRA-D 24) 180-240 MG 24 hr tablet, Take 1 tablet by mouth as needed (allergies)., Disp: , Rfl:    fluticasone (FLONASE) 50 MCG/ACT nasal spray, Place 2 sprays into both nostrils as needed for allergies or rhinitis., Disp: , Rfl:    fluticasone (FLONASE) 50 MCG/ACT nasal spray, Place 2 sprays into both nostrils daily., Disp: 16 g, Rfl: 0   linaclotide (LINZESS) 145 MCG CAPS capsule, TAKE 1 CAPSULE BY MOUTH DAILY BEFORE BREAKFAST, Disp: 90 capsule, Rfl: 3   nitroGLYCERIN (NITROLINGUAL) 0.4 MG/SPRAY spray, Place 1 spray under the tongue every 5 (five) minutes x 3 doses as needed for chest pain., Disp: , Rfl:    predniSONE (DELTASONE) 20 MG tablet, Take 3 tabs PO daily x 5 days., Disp: 15 tablet, Rfl: 0   sertraline (ZOLOFT) 50 MG tablet, Take 1 tablet (50 mg total) by mouth daily., Disp: 90 tablet, Rfl: 0  Observations/Objective: Patient is well-developed, well-nourished in no acute distress.  Resting comfortably at home.  Head is normocephalic, atraumatic.  No labored breathing.  Speech  is clear and coherent with logical content.  Patient is alert and oriented at baseline.    Assessment and Plan: 1. COVID-19 - molnupiravir EUA (LAGEVRIO) 200 mg CAPS capsule; Take 4 capsules (800 mg total) by mouth 2 (two) times daily for 5 days.  Dispense: 40 capsule; Refill: 0 - promethazine-dextromethorphan (PROMETHAZINE-DM) 6.25-15 MG/5ML syrup; Take 5 mLs by mouth 4 (four) times daily as needed.  Dispense: 118 mL; Refill: 0  - Continue OTC symptomatic management of choice - Will send OTC vitamins and supplement information through AVS - Molnupiravir and Promethazine DM prescribed - Patient enrolled in MyChart symptom monitoring - Push fluids - Rest as needed - Discussed return precautions and when to seek in-person evaluation, sent via AVS as well   Follow Up Instructions: I  discussed the assessment and treatment plan with the patient. The patient was provided an opportunity to ask questions and all were answered. The patient agreed with the plan and demonstrated an understanding of the instructions.  A copy of instructions were sent to the patient via MyChart unless otherwise noted below.    The patient was advised to call back or seek an in-person evaluation if the symptoms worsen or if the condition fails to improve as anticipated.  Time:  I spent 10 minutes with the patient via telehealth technology discussing the above problems/concerns.    Margaretann Loveless, PA-C

## 2022-07-12 ENCOUNTER — Telehealth: Payer: Medicare HMO | Admitting: Physician Assistant

## 2022-07-12 DIAGNOSIS — J019 Acute sinusitis, unspecified: Secondary | ICD-10-CM

## 2022-07-12 DIAGNOSIS — B9689 Other specified bacterial agents as the cause of diseases classified elsewhere: Secondary | ICD-10-CM | POA: Diagnosis not present

## 2022-07-12 MED ORDER — MECLIZINE HCL 12.5 MG PO TABS: 12.5000 mg | ORAL_TABLET | Freq: Three times a day (TID) | ORAL | 0 refills | Status: DC | PRN

## 2022-07-12 MED ORDER — DOXYCYCLINE HYCLATE 100 MG PO TABS: 100.0000 mg | ORAL_TABLET | Freq: Two times a day (BID) | ORAL | 0 refills | Status: DC

## 2022-07-12 NOTE — Progress Notes (Signed)
Virtual Visit Consent   Leonard Padilla, you are scheduled for a virtual visit with a [facility] provider today. Just as with appointments in the office, your consent must be obtained to participate. Your consent will be active for this visit and any virtual visit you may have with one of our providers in the next 365 days. If you have a MyChart account, a copy of this consent can be sent to you electronically.  As this is a virtual visit, video technology does not allow for your provider to perform a traditional examination. This may limit your provider's ability to fully assess your condition. If your provider identifies any concerns that need to be evaluated in person or the need to arrange testing (such as labs, EKG, etc.), we will make arrangements to do so. Although advances in technology are sophisticated, we cannot ensure that it will always work on either your end or our end. If the connection with a video visit is poor, the visit may have to be switched to a telephone visit. With either a video or telephone visit, we are not always able to ensure that we have a secure connection.  By engaging in this virtual visit, you consent to the provision of healthcare and authorize for your insurance to be billed (if applicable) for the services provided during this visit. Depending on your insurance coverage, you may receive a charge related to this service.  I need to obtain your verbal consent now. Are you willing to proceed with your visit today? [name] has provided verbal consent on 07/12/2022 for a virtual visit (video or telephone). Leonard Padilla, [place]  Date: 07/12/2022 9:11 AM  Virtual Visit via Video Note   I, Leonard Padilla, connected with  Leonard Padilla  ([id number], 02/23/1946) on 07/12/22 at  9:00 AM EST by a video-enabled telemedicine application and verified that I am speaking with the correct person using two identifiers.  Location: Patient: Virtual Visit Location  Patient: Home Provider: Virtual Visit Location Provider: Home Office   I discussed the limitations of evaluation and management by telemedicine and the availability of in person appointments. The patient expressed understanding and agreed to proceed.    History of Present Illness: Leonard Padilla is a 76 y.o. who identifies as a male who was assigned male at birth, and is being seen today for some lingering head congestion with sinus pressure after recent COVID infection. Notes was doing much better with use of antiviral with bulk of respiratory symptoms completely resolved. Still with sinus symptoms and over past 24 hours with sinus pain and episode of vertigo that was short lived. Denies chest pain, SOB, lightheadedness or racing heart. Has been keeping well-hydrated. BP at 117/68.  HPI: HPI  Problems:  Patient Active Problem List   Diagnosis Date Noted   Non-recurrent acute suppurative otitis media of right ear without spontaneous rupture of tympanic membrane 04/03/2022   Actinic keratosis 12/14/2021   Chronic constipation 09/03/2021   Intraoperative floppy iris syndrome (IFIS) 03/02/2021   Aortic stenosis    Arthritis    Coronary artery disease    Heart murmur    History of colon polyps    Hyperlipidemia    Sleep apnea    Wears glasses    Allergic conjunctivitis of left eye 10/16/2019   History of colonic polyps 03/02/2019   Family history of prostate cancer in father 03/02/2019   SBE (subacute bacterial endocarditis) prophylaxis candidate 10/06/2018   Combined form of  age-related cataract, both eyes 08/12/2018   Hypertensive retinopathy of both eyes 08/12/2018   Posterior vitreous detachment, left eye 08/12/2018   Colon cancer screening declined 05/27/2018   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   Bilateral leg edema 09/24/2017   Urinary retention due to benign prostatic hyperplasia 09/13/2017   S/P AVR 09/09/2017   Diastolic dysfunction without heart failure  08/19/2017   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/07/2017   Lower urinary tract symptoms (LUTS) 04/29/2017   Former smoker, stopped smoking in distant past    Dysthymia 03/18/2017   Irritability and anger 03/18/2017   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   Dyslipidemia, goal LDL below 70 03/18/2017   Postural dizziness with presyncope 08/27/2016   CKD (chronic kidney disease) stage 3, GFR 30-59 ml/min (HCC) 08/04/2016   CKD stage G3a/A1, GFR 45-59 and albumin creatinine ratio <30 mg/g (HCC) 08/04/2016   Status post CVA 08/03/2016   Status post carotid endarterectomy 12/15/2015   Symptomatic carotid artery stenosis 12/15/2015   Hypertension goal BP (blood pressure) < 140/90 10/13/2015   Stroke (HCC) 09/2015   Psoriasis 12/02/2014    Allergies:  Allergies  Allergen Reactions   Viagra [Sildenafil Citrate] Other (See Comments)    COLOR DISCRIMINATION [DOSE RELATED] "blue sensation"   Metoprolol Other (See Comments)    Orthostasis, near syncope   Medications:  Current Outpatient Medications:    doxycycline (VIBRA-TABS) 100 MG tablet, Take 1 tablet (100 mg total) by mouth 2 (two) times daily., Disp: 20 tablet, Rfl: 0   meclizine (ANTIVERT) 12.5 MG tablet, Take 1 tablet (12.5 mg total) by mouth 3 (three) times daily as needed for dizziness., Disp: 15 tablet, Rfl: 0   alfuzosin (UROXATRAL) 10 MG 24 hr tablet, TAKE 1 TABLET(10 MG) BY MOUTH DAILY WITH BREAKFAST, Disp: 90 tablet, Rfl: 1   aspirin EC 81 MG tablet, Take 1 tablet (81 mg total) by mouth daily., Disp: 90 tablet, Rfl: 1   atorvastatin (LIPITOR) 80 MG tablet, Take 1 tablet (80 mg total) by mouth daily., Disp: 90 tablet, Rfl: 3   clobetasol cream (TEMOVATE) 0.05 %, Apply 1 application topically as needed (Psoriasis)., Disp: , Rfl:    FERROUS SULFATE PO, Take 1 tablet by mouth daily., Disp: , Rfl:    fexofenadine-pseudoephedrine (ALLEGRA-D 24) 180-240 MG 24 hr tablet, Take 1 tablet by mouth as needed (allergies)., Disp: , Rfl:     fluticasone (FLONASE) 50 MCG/ACT nasal spray, Place 2 sprays into both nostrils as needed for allergies or rhinitis., Disp: , Rfl:    fluticasone (FLONASE) 50 MCG/ACT nasal spray, Place 2 sprays into both nostrils daily., Disp: 16 g, Rfl: 0   linaclotide (LINZESS) 145 MCG CAPS capsule, TAKE 1 CAPSULE BY MOUTH DAILY BEFORE BREAKFAST, Disp: 90 capsule, Rfl: 3   nitroGLYCERIN (NITROLINGUAL) 0.4 MG/SPRAY spray, Place 1 spray under the tongue every 5 (five) minutes x 3 doses as needed for chest pain., Disp: , Rfl:    sertraline (ZOLOFT) 50 MG tablet, Take 1 tablet (50 mg total) by mouth daily., Disp: 90 tablet, Rfl: 0  Observations/Objective: Patient is well-developed, well-nourished in no acute distress.  Resting comfortably at home.  Head is normocephalic, atraumatic.  No labored breathing. Speech is clear and coherent with logical content.  Patient is alert and oriented at baseline.   Assessment and Plan: 1. Acute bacterial sinusitis - doxycycline (VIBRA-TABS) 100 MG tablet; Take 1 tablet (100 mg total) by mouth 2 (two) times daily.  Dispense: 20 tablet; Refill: 0  After  COVID. Start Doxycycline per orders. OTC medications and supportive measures reviewed. Will give low dose of Meclizine to have on hand in case of any more episodes of vertigo while the other medications are getting into his system. If sinus symptoms are not resolving or any recurrence/worsening of vertigo he is to seek an in-person evaluation ASAP.  Follow Up Instructions: I discussed the assessment and treatment plan with the patient. The patient was provided an opportunity to ask questions and all were answered. The patient agreed with the plan and demonstrated an understanding of the instructions.  A copy of instructions were sent to the patient via MyChart unless otherwise noted below.   The patient was advised to call back or seek an in-person evaluation if the symptoms worsen or if the condition fails to improve as  anticipated.  Time:  I spent 10 minutes with the patient via telehealth technology discussing the above problems/concerns.    Leonard Climes, PA-C

## 2022-07-12 NOTE — Patient Instructions (Signed)
Bertha Stakes, thank you for joining [name], PA-C for today's virtual visit.  While this provider is not your primary care provider (PCP), if your PCP is located in our provider database this encounter information will be shared with them immediately following your visit.   A [facility] MyChart account gives you access to today's visit and all your visits, tests, and labs performed at [facility] " click here if you don't have a [facility] MyChart account or go to [email or web address]  Consent: (Patient) Leonard Padilla provided verbal consent for this virtual visit at the beginning of the encounter.  Current Medications:  Current Outpatient Medications:    alfuzosin (UROXATRAL) 10 MG 24 hr tablet, TAKE 1 TABLET(10 MG) BY MOUTH DAILY WITH BREAKFAST, Disp: 90 tablet, Rfl: 1   aspirin EC 81 MG tablet, Take 1 tablet (81 mg total) by mouth daily., Disp: 90 tablet, Rfl: 1   atorvastatin (LIPITOR) 80 MG tablet, Take 1 tablet (80 mg total) by mouth daily., Disp: 90 tablet, Rfl: 3   clobetasol cream (TEMOVATE) 0.05 %, Apply 1 application topically as needed (Psoriasis)., Disp: , Rfl:    FERROUS SULFATE PO, Take 1 tablet by mouth daily., Disp: , Rfl:    fexofenadine-pseudoephedrine (ALLEGRA-D 24) 180-240 MG 24 hr tablet, Take 1 tablet by mouth as needed (allergies)., Disp: , Rfl:    fluticasone (FLONASE) 50 MCG/ACT nasal spray, Place 2 sprays into both nostrils as needed for allergies or rhinitis., Disp: , Rfl:    fluticasone (FLONASE) 50 MCG/ACT nasal spray, Place 2 sprays into both nostrils daily., Disp: 16 g, Rfl: 0   linaclotide (LINZESS) 145 MCG CAPS capsule, TAKE 1 CAPSULE BY MOUTH DAILY BEFORE BREAKFAST, Disp: 90 capsule, Rfl: 3   nitroGLYCERIN (NITROLINGUAL) 0.4 MG/SPRAY spray, Place 1 spray under the tongue every 5 (five) minutes x 3 doses as needed for chest pain., Disp: , Rfl:    predniSONE (DELTASONE) 20 MG tablet, Take 3 tabs PO daily x 5 days., Disp: 15 tablet,  Rfl: 0   promethazine-dextromethorphan (PROMETHAZINE-DM) 6.25-15 MG/5ML syrup, Take 5 mLs by mouth 4 (four) times daily as needed., Disp: 118 mL, Rfl: 0   sertraline (ZOLOFT) 50 MG tablet, Take 1 tablet (50 mg total) by mouth daily., Disp: 90 tablet, Rfl: 0   Medications ordered in this encounter:  No orders of the defined types were placed in this encounter.    *If you need refills on other medications prior to your next appointment, please contact your pharmacy*  Follow-Up: Call back or seek an in-person evaluation if the symptoms worsen or if the condition fails to improve as anticipated.  [facility] [phone]  Other Instructions Please keep hydrated and rest. Start the Doxycycline as directed. Resume your Flonase and allergy medications. Run a humidifier in the bedroom The Meclizine is to use for any recurrence of mild dizziness while we are treating underlying issue. If anything is worsening, I want you to be evaluated in person ASAP. DO NOT DELAY CARE.   If you have been instructed to have an in-person evaluation today at a local Urgent Care facility, please use the link below. It will take you to a list of all of our available [facility], including address, phone number and hours of operation. Please do not delay care.  [facility]  If you or a family member do not have a primary care provider, use the link below to schedule a visit and  establish care. When you choose a [facility] primary care physician or advanced practice provider, you gain a long-term partner in health. Find a Primary Care Provider  Learn more about [facility] in-office and virtual care options: [facility] Get Care Now

## 2022-08-05 ENCOUNTER — Other Ambulatory Visit: Payer: Self-pay | Admitting: Family Medicine

## 2022-09-12 ENCOUNTER — Other Ambulatory Visit: Payer: Self-pay | Admitting: Family Medicine

## 2022-09-20 ENCOUNTER — Encounter: Payer: Self-pay | Admitting: Family Medicine

## 2022-09-20 ENCOUNTER — Ambulatory Visit (INDEPENDENT_AMBULATORY_CARE_PROVIDER_SITE_OTHER): Payer: Medicare HMO | Admitting: Family Medicine

## 2022-09-20 VITALS — BP 145/81 | HR 78 | Ht 72.0 in | Wt 195.0 lb

## 2022-09-20 DIAGNOSIS — N401 Enlarged prostate with lower urinary tract symptoms: Secondary | ICD-10-CM | POA: Diagnosis not present

## 2022-09-20 DIAGNOSIS — Z0000 Encounter for general adult medical examination without abnormal findings: Secondary | ICD-10-CM

## 2022-09-20 DIAGNOSIS — I10 Essential (primary) hypertension: Secondary | ICD-10-CM | POA: Diagnosis not present

## 2022-09-20 DIAGNOSIS — N183 Chronic kidney disease, stage 3 unspecified: Secondary | ICD-10-CM

## 2022-09-20 DIAGNOSIS — E782 Mixed hyperlipidemia: Secondary | ICD-10-CM

## 2022-09-20 DIAGNOSIS — R338 Other retention of urine: Secondary | ICD-10-CM

## 2022-09-20 HISTORY — DX: Encounter for general adult medical examination without abnormal findings: Z00.00

## 2022-09-20 MED ORDER — ATORVASTATIN CALCIUM 80 MG PO TABS: 80.0000 mg | ORAL_TABLET | Freq: Every day | ORAL | 3 refills | Status: DC

## 2022-09-20 MED ORDER — LINACLOTIDE 145 MCG PO CAPS: ORAL_CAPSULE | ORAL | 3 refills | Status: DC

## 2022-09-20 MED ORDER — SERTRALINE HCL 50 MG PO TABS: ORAL_TABLET | ORAL | 3 refills | Status: DC

## 2022-09-20 MED ORDER — ALFUZOSIN HCL ER 10 MG PO TB24: ORAL_TABLET | ORAL | 1 refills | Status: DC

## 2022-09-20 MED ORDER — HALOBETASOL PROPIONATE 0.05 % EX CREA
TOPICAL_CREAM | Freq: Two times a day (BID) | CUTANEOUS | 2 refills | Status: DC
Start: 2022-09-20 — End: 2023-09-25

## 2022-09-20 MED ORDER — FLUTICASONE PROPIONATE 50 MCG/ACT NA SUSP: 2.0000 | Freq: Every day | NASAL | 0 refills | Status: AC

## 2022-09-20 NOTE — Addendum Note (Signed)
Addended by: Perlie Mayo on: 09/20/2022 09:03 AM   Modules accepted: Orders

## 2022-09-20 NOTE — Progress Notes (Signed)
Leonard Padilla - 77 y.o. male MRN [id number]  Date of birth: 03/14/1946  Subjective Chief Complaint  Patient presents with   Follow-up    Blood work     HPI Leonard Padilla is a 77 y.o. male here today for annual exam.   He reports that he is doing pretty well.  He feels like he is doing well at this time.  He denies new concerns today.  He would like to change from clobetasol back to halobetasol as this seemed to work better for him.  He remains moderately active. He feels like diet is pretty good.   He is a non-smoker, quit in [place].  1 glass of wine per day.   Immunizations are UTD.   Review of Systems  Constitutional:  Negative for chills, fever, malaise/fatigue and weight loss.  HENT:  Negative for congestion, ear pain and sore throat.   Eyes:  Negative for blurred vision, double vision and pain.  Respiratory:  Negative for cough and shortness of breath.   Cardiovascular:  Negative for chest pain and palpitations.  Gastrointestinal:  Negative for abdominal pain, blood in stool, constipation, heartburn and nausea.  Genitourinary:  Negative for dysuria and urgency.  Musculoskeletal:  Negative for joint pain and myalgias.  Neurological:  Negative for dizziness and headaches.  Endo/Heme/Allergies:  Does not bruise/bleed easily.  Psychiatric/Behavioral:  Negative for depression. The patient is not nervous/anxious and does not have insomnia.     Allergies  Allergen Reactions   Viagra [Sildenafil Citrate] Other (See Comments)    COLOR DISCRIMINATION [DOSE RELATED] "blue sensation"   Metoprolol Other (See Comments)    Orthostasis, near syncope    Past Medical History:  Diagnosis Date   Allergic conjunctivitis of left eye 10/16/2019   Aortic stenosis    moderate AS 12/01/15 echo (Dr. Curt Bears)   Arthritis    Bilateral leg edema 09/24/2017   Chronic constipation 09/03/2021   CKD (chronic kidney disease) stage 3, GFR 30-59 ml/min (HCC) 08/04/2016   CKD stage G3a/A1, GFR 45-59  and albumin creatinine ratio <30 mg/g (HCC) 08/04/2016   Colon cancer screening declined 05/27/2018   Combined form of age-related cataract, both eyes 08/12/2018   Coronary artery disease    Diastolic dysfunction without heart failure 08/19/2017   Dyslipidemia, goal LDL below 70 03/18/2017   Dysthymia 03/18/2017   Ectatic abdominal aorta ([place]) 05/07/2017   Mild, repeat in 5 years (2023)   Family history of prostate cancer in father 03/02/2019   Former smoker, stopped smoking in distant past    Heart murmur    History of colon polyps    History of colonic polyps 03/02/2019   Hyperlipidemia    Hypertension goal BP (blood pressure) < 140/90 10/13/2015   Hypertensive retinopathy of both eyes 08/12/2018   Intraoperative floppy iris syndrome (IFIS) 03/02/2021   Irritability and anger 03/18/2017   Lower urinary tract symptoms (LUTS) 04/29/2017   Posterior vitreous detachment, left eye 08/12/2018   Postural dizziness with presyncope 08/27/2016   Psoriasis    S/P AVR 09/09/2017   Edwards Lifesciences 23 mm, model 3300TFX, serial [id number]     SBE (subacute bacterial endocarditis) prophylaxis candidate 10/06/2018   Sleep apnea    to pick cpap tomorrow 12/13/15   Status post carotid endarterectomy 12/15/2015   Right sided B/l plaques <50% velocity (09/2016)   Status post CVA 08/03/2016   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   Stroke ([place]) 09/2015   Symptomatic carotid artery stenosis  12/15/2015   Urinary retention due to benign prostatic hyperplasia 09/13/2017   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   Wears glasses     Past Surgical History:  Procedure Laterality Date   AORTIC VALVE REPLACEMENT N/A 09/09/2017   Procedure: AORTIC VALVE REPLACEMENT (AVR) using a 23 Edwards Perimount Magna Ease Aortic Valve;  Surgeon: Grace Isaac, MD;  Location: [facility];  Service: Open Heart Surgery;  Laterality: N/A;   COLONOSCOPY W/ BIOPSIES AND POLYPECTOMY     CORONARY  ARTERY BYPASS GRAFT N/A 09/09/2017   Procedure: CORONARY ARTERY BYPASS GRAFTING (CABG) x 4 using the left internal mammary artery and right greater saphenous vein harvested endoscopically.;  Surgeon: Grace Isaac, MD;  Location: [facility];  Service: Open Heart Surgery;  Laterality: N/A;   ENDARTERECTOMY Right 12/15/2015   Procedure: RIGHT CAROTID ENDARTERECTOMY WITH PATCH ANGIOPLASTY;  Surgeon: Serafina Mitchell, MD;  Location: [facility];  Service: Vascular;  Laterality: Right;   EYE SURGERY Bilateral    Right 03/02/2021 Left 03/09/2021 Dr Larose Kells   [facility] ANGIOPLASTY Right 12/15/2015   Procedure: RIGHT CAROTID PATCH ANGIOPLASTY;  Surgeon: Serafina Mitchell, MD;  Location: [facility];  Service: Vascular;  Laterality: Right;   RIGHT/LEFT HEART CATH AND CORONARY ANGIOGRAPHY N/A 08/23/2017   Procedure: RIGHT/LEFT HEART CATH AND CORONARY ANGIOGRAPHY;  Surgeon: Martinique, Peter M, MD;  Location: [facility];  Service: Cardiovascular;  Laterality: N/A;   TEE WITHOUT CARDIOVERSION N/A 09/09/2017   Procedure: TRANSESOPHAGEAL ECHOCARDIOGRAM (TEE);  Surgeon: Grace Isaac, MD;  Location: [facility];  Service: Open Heart Surgery;  Laterality: N/A;   WISDOM TOOTH EXTRACTION      Social History   Socioeconomic History   Marital status: Married    Spouse name: [name]   Number of children: 2   Years of education: 13   Highest education level: Not on file  Occupational History   Occupation: Retired    Comment: retired Nature conservation officer, [employer]  Tobacco Use   Smoking status: Former    Types: Cigarettes    Quit date: 08/07/1983    Years since quitting: 39.1   Smokeless tobacco: Never  Vaping Use   Vaping Use: Never used  Substance and Sexual Activity   Alcohol use: Yes    Comment: 1 glass of wine daily    Drug use: No   Sexual activity: Not Currently  Other Topics Concern   Not on file  Social History Narrative   Lives with wife   Caffeine use- coffee 3-4 cups daily   Social Determinants of Health    Financial Resource Strain: Not on file  Food Insecurity: Not on file  Transportation Needs: Not on file  Physical Activity: Not on file  Stress: Not on file  Social Connections: Not on file    Family History  Problem Relation Age of Onset   Cancer Mother        ovarian cancer   Cancer Father        Prostate    Health Maintenance  Topic Date Due   Medicare Annual Wellness (AWV)  03/03/2021   COVID-19 Vaccine (7 - 2023-24 season) 10/06/2022 (Originally 07/23/2022)   INFLUENZA VACCINE  11/04/2022 (Originally 03/06/2022)   Pneumonia Vaccine 11+ Years old  Completed   Hepatitis C Screening  Completed   Zoster Vaccines- Shingrix  Completed   HPV VACCINES  Aged Out   DTaP/Tdap/Td  Discontinued     ----------------------------------------------------------------------------------------------------------------------------------------------------------------------------------------------------------------- Physical Exam BP (!) 145/81 (BP Location: Left Arm, Patient Position: Sitting,  Cuff Size: Normal)   Pulse 78   Ht 6' (1.829 m)   Wt 195 lb 0.6 oz (88.5 kg)   SpO2 99%   BMI 26.45 kg/m   Physical Exam Constitutional:      General: He is not in acute distress. HENT:     Head: Normocephalic and atraumatic.     Right Ear: Tympanic membrane and external ear normal.     Left Ear: Tympanic membrane and external ear normal.  Eyes:     General: No scleral icterus. Neck:     Thyroid: No thyromegaly.  Cardiovascular:     Rate and Rhythm: Normal rate and regular rhythm.     Heart sounds: Normal heart sounds.  Pulmonary:     Effort: Pulmonary effort is normal.     Breath sounds: Normal breath sounds.  Abdominal:     General: Bowel sounds are normal. There is no distension.     Palpations: Abdomen is soft.     Tenderness: There is no abdominal tenderness. There is no guarding.  Musculoskeletal:     Cervical back: Normal range of motion.  Lymphadenopathy:     Cervical: No  cervical adenopathy.  Skin:    General: Skin is warm and dry.     Findings: No rash.  Neurological:     Mental Status: He is alert and oriented to person, place, and time.     Cranial Nerves: No cranial nerve deficit.     Motor: No abnormal muscle tone.  Psychiatric:        Mood and Affect: Mood normal.        Behavior: Behavior normal.     ------------------------------------------------------------------------------------------------------------------------------------------------------------------------------------------------------------------- Assessment and Plan  Well adult exam Well adult Orders Placed This Encounter  Procedures   COMPLETE METABOLIC PANEL WITH GFR   CBC with Differential   Lipid Panel w/reflex Direct LDL   PSA   TSH  Screenings: per lab orders Immunizations: UTD Anticipatory guidance/Risk factor reduction:  Recommendations per AVS.    Meds ordered this encounter  Medications   alfuzosin (UROXATRAL) 10 MG 24 hr tablet    Sig: TAKE 1 TABLET BY MOUTH EVERY DAY WITH BREAKFAST    Dispense:  90 tablet    Refill:  1   atorvastatin (LIPITOR) 80 MG tablet    Sig: Take 1 tablet (80 mg total) by mouth daily.    Dispense:  90 tablet    Refill:  3   linaclotide (LINZESS) 145 MCG CAPS capsule    Sig: TAKE 1 CAPSULE BY MOUTH DAILY BEFORE BREAKFAST    Dispense:  90 capsule    Refill:  3   sertraline (ZOLOFT) 50 MG tablet    Sig: TAKE 1 TABLET(50 MG) BY MOUTH DAILY    Dispense:  90 tablet    Refill:  3   halobetasol (ULTRAVATE) 0.05 % cream    Sig: Apply topically 2 (two) times daily.    Dispense:  50 g    Refill:  2    No follow-ups on file.    This visit occurred during the SARS-CoV-2 public health emergency.  Safety protocols were in place, including screening questions prior to the visit, additional usage of staff PPE, and extensive cleaning of exam room while observing appropriate contact time as indicated for disinfecting solutions.

## 2022-09-20 NOTE — Assessment & Plan Note (Signed)
Well adult Orders Placed This Encounter  Procedures   COMPLETE METABOLIC PANEL WITH GFR   CBC with Differential   Lipid Panel w/reflex Direct LDL   PSA   TSH  Screenings: per lab orders Immunizations: UTD Anticipatory guidance/Risk factor reduction:  Recommendations per AVS.

## 2022-09-20 NOTE — Patient Instructions (Signed)
Preventive Care 65 Years and Older, Male Preventive care refers to lifestyle choices and visits with your health care provider that can promote health and wellness. Preventive care visits are also called wellness exams. What can I expect for my preventive care visit? Counseling During your preventive care visit, your health care provider may ask about your: Medical history, including: Past medical problems. Family medical history. History of falls. Current health, including: Emotional well-being. Home life and relationship well-being. Sexual activity. Memory and ability to understand (cognition). Lifestyle, including: Alcohol, nicotine or tobacco, and drug use. Access to firearms. Diet, exercise, and sleep habits. Work and work environment. Sunscreen use. Safety issues such as seatbelt and bike helmet use. Physical exam Your health care provider will check your: Height and weight. These may be used to calculate your BMI (body mass index). BMI is a measurement that tells if you are at a healthy weight. Waist circumference. This measures the distance around your waistline. This measurement also tells if you are at a healthy weight and may help predict your risk of certain diseases, such as type 2 diabetes and high blood pressure. Heart rate and blood pressure. Body temperature. Skin for abnormal spots. What immunizations do I need?  Vaccines are usually given at various ages, according to a schedule. Your health care provider will recommend vaccines for you based on your age, medical history, and lifestyle or other factors, such as travel or where you work. What tests do I need? Screening Your health care provider may recommend screening tests for certain conditions. This may include: Lipid and cholesterol levels. Diabetes screening. This is done by checking your blood sugar (glucose) after you have not eaten for a while (fasting). Hepatitis C test. Hepatitis B test. HIV (human  immunodeficiency virus) test. STI (sexually transmitted infection) testing, if you are at risk. Lung cancer screening. Colorectal cancer screening. Prostate cancer screening. Abdominal aortic aneurysm (AAA) screening. You may need this if you are a current or former smoker. Talk with your health care provider about your test results, treatment options, and if necessary, the need for more tests. Follow these instructions at home: Eating and drinking  Eat a diet that includes fresh fruits and vegetables, whole grains, lean protein, and low-fat dairy products. Limit your intake of foods with high amounts of sugar, saturated fats, and salt. Take vitamin and mineral supplements as recommended by your health care provider. Do not drink alcohol if your health care provider tells you not to drink. If you drink alcohol: Limit how much you have to 0-2 drinks a day. Know how much alcohol is in your drink. In the [place] one drink equals one 12 oz bottle of beer (355 mL), one 5 oz glass of wine (148 mL), or one 1 oz glass of hard liquor (44 mL). Lifestyle Brush your teeth every morning and night with fluoride toothpaste. Floss one time each day. Exercise for at least 30 minutes 5 or more days each week. Do not use any products that contain nicotine or tobacco. These products include cigarettes, chewing tobacco, and vaping devices, such as e-cigarettes. If you need help quitting, ask your health care provider. Do not use drugs. If you are sexually active, practice safe sex. Use a condom or other form of protection to prevent STIs. Take aspirin only as told by your health care provider. Make sure that you understand how much to take and what form to take. Work with your health care provider to find out whether it is safe   and beneficial for you to take aspirin daily. Ask your health care provider if you need to take a cholesterol-lowering medicine (statin). Find healthy ways to manage stress, such  as: Meditation, yoga, or listening to music. Journaling. Talking to a trusted person. Spending time with friends and family. Safety Always wear your seat belt while driving or riding in a vehicle. Do not drive: If you have been drinking alcohol. Do not ride with someone who has been drinking. When you are tired or distracted. While texting. If you have been using any mind-altering substances or drugs. Wear a helmet and other protective equipment during sports activities. If you have firearms in your house, make sure you follow all gun safety procedures. Minimize exposure to UV radiation to reduce your risk of skin cancer. What's next? Visit your health care provider once a year for an annual wellness visit. Ask your health care provider how often you should have your eyes and teeth checked. Stay up to date on all vaccines. This information is not intended to replace advice given to you by your health care provider. Make sure you discuss any questions you have with your health care provider. Document Revised: 01/18/2021 Document Reviewed: 01/18/2021 Elsevier Patient Education  2023 Elsevier Inc.  

## 2022-09-21 ENCOUNTER — Other Ambulatory Visit: Payer: Self-pay

## 2022-09-21 LAB — LIPID PANEL W/REFLEX DIRECT LDL
Cholesterol: 150 mg/dL (ref <200)
HDL: 54 mg/dL (ref >=40)
LDL Cholesterol (Calc): 73 mg/dL (calc)
Non-HDL Cholesterol (Calc): 96 mg/dL (calc) (ref <130)
Total CHOL/HDL Ratio: 2.8 (calc) (ref <5.0)
Triglycerides: 150 mg/dL — ABNORMAL HIGH (ref <150)

## 2022-09-21 LAB — COMPLETE METABOLIC PANEL WITH GFR
AG Ratio: 1.7 (calc) (ref 1.0–2.5)
ALT: 23 U/L (ref 9–46)
AST: 20 U/L (ref 10–35)
Albumin: 4.3 g/dL (ref 3.6–5.1)
Alkaline phosphatase (APISO): 75 U/L (ref 35–144)
BUN/Creatinine Ratio: 20 (calc) (ref 6–22)
BUN: 30 mg/dL — ABNORMAL HIGH (ref 7–25)
CO2: 24 mmol/L (ref 20–32)
Calcium: 9.6 mg/dL (ref 8.6–10.3)
Chloride: 108 mmol/L (ref 98–110)
Creat: 1.53 mg/dL — ABNORMAL HIGH (ref 0.70–1.28)
Globulin: 2.6 g/dL (calc) (ref 1.9–3.7)
Glucose, Bld: 92 mg/dL (ref 65–99)
Potassium: 4.4 mmol/L (ref 3.5–5.3)
Sodium: 141 mmol/L (ref 135–146)
Total Bilirubin: 0.6 mg/dL (ref 0.2–1.2)
Total Protein: 6.9 g/dL (ref 6.1–8.1)
eGFR: 46 mL/min/{1.73_m2} — ABNORMAL LOW (ref >=60)

## 2022-09-21 LAB — PSA: PSA: 0.73 ng/mL (ref <=4.00)

## 2022-09-21 LAB — CBC WITH DIFFERENTIAL/PLATELET
Absolute Monocytes: 450 cells/uL (ref 200–950)
Basophils Absolute: 42 cells/uL (ref 0–200)
Basophils Relative: 0.7 %
Eosinophils Absolute: 342 cells/uL (ref 15–500)
Eosinophils Relative: 5.7 %
HCT: 42.3 % (ref 38.5–50.0)
Hemoglobin: 14.3 g/dL (ref 13.2–17.1)
Lymphs Abs: 1536 cells/uL (ref 850–3900)
MCH: 31.5 pg (ref 27.0–33.0)
MCHC: 33.8 g/dL (ref 32.0–36.0)
MCV: 93.2 fL (ref 80.0–100.0)
MPV: 10 fL (ref 7.5–12.5)
Monocytes Relative: 7.5 %
Neutro Abs: 3630 cells/uL (ref 1500–7800)
Neutrophils Relative %: 60.5 %
Platelets: 196 10*3/uL (ref 140–400)
RBC: 4.54 10*6/uL (ref 4.20–5.80)
RDW: 12.3 % (ref 11.0–15.0)
Total Lymphocyte: 25.6 %
WBC: 6 10*3/uL (ref 3.8–10.8)

## 2022-09-21 LAB — TSH: TSH: 4.67 mIU/L — ABNORMAL HIGH (ref 0.40–4.50)

## 2022-10-24 ENCOUNTER — Other Ambulatory Visit: Payer: Self-pay | Admitting: Family Medicine

## 2022-10-26 ENCOUNTER — Other Ambulatory Visit: Payer: Self-pay | Admitting: Family Medicine

## 2022-10-26 DIAGNOSIS — E782 Mixed hyperlipidemia: Secondary | ICD-10-CM

## 2022-10-29 ENCOUNTER — Telehealth: Payer: Self-pay | Admitting: Family Medicine

## 2022-10-29 NOTE — Telephone Encounter (Signed)
Called patient to schedule Medicare Annual Wellness Visit (AWV). Left message for patient to call back and schedule Medicare Annual Wellness Visit (AWV).  Last date of AWV: 03/03/2020  Please schedule an appointment at any time with [facility].  If any questions, please contact me at [phone].  Thank you ,  Lin Givens Patient Access Advocate II Direct Dial: [phone]

## 2022-12-13 ENCOUNTER — Other Ambulatory Visit: Payer: Self-pay | Admitting: Family Medicine

## 2023-03-21 ENCOUNTER — Ambulatory Visit (INDEPENDENT_AMBULATORY_CARE_PROVIDER_SITE_OTHER): Payer: Medicare HMO | Admitting: Family Medicine

## 2023-03-21 ENCOUNTER — Encounter: Payer: Self-pay | Admitting: Family Medicine

## 2023-03-21 VITALS — BP 161/80 | HR 67 | Ht 72.0 in | Wt 192.0 lb

## 2023-03-21 DIAGNOSIS — E785 Hyperlipidemia, unspecified: Secondary | ICD-10-CM | POA: Diagnosis not present

## 2023-03-21 DIAGNOSIS — I10 Essential (primary) hypertension: Secondary | ICD-10-CM | POA: Diagnosis not present

## 2023-03-21 DIAGNOSIS — Z23 Encounter for immunization: Secondary | ICD-10-CM | POA: Diagnosis not present

## 2023-03-21 DIAGNOSIS — E782 Mixed hyperlipidemia: Secondary | ICD-10-CM

## 2023-03-21 DIAGNOSIS — N183 Chronic kidney disease, stage 3 unspecified: Secondary | ICD-10-CM

## 2023-03-21 DIAGNOSIS — I77811 Abdominal aortic ectasia: Secondary | ICD-10-CM | POA: Diagnosis not present

## 2023-03-21 DIAGNOSIS — Z125 Encounter for screening for malignant neoplasm of prostate: Secondary | ICD-10-CM

## 2023-03-21 DIAGNOSIS — Z8673 Personal history of transient ischemic attack (TIA), and cerebral infarction without residual deficits: Secondary | ICD-10-CM

## 2023-03-21 NOTE — Assessment & Plan Note (Signed)
Updated [place] ordered.

## 2023-03-21 NOTE — Progress Notes (Signed)
Leonard Padilla - 77 y.o. male MRN [id number]  Date of birth: 1945/12/05  Subjective Chief Complaint  Patient presents with   Labs Only    HPI Leonard Padilla is a 77 y.o. male here today for follow up.   He reports that he is doing well.   BP is elevated on initial check today.  He is not currently on medication for treatment of HTN.  He denies symptoms including chest pain, shortness of breath, palpitations, headache or vision changes.    He is taking atoravastatin regularly and tolerating this well.  He has history of CVA and carotid artery disease.  Taking 81mg  asa daily.    He has stopped linzess and is using a magnesium supplement.  This is helpful for his constipation.   . ROS:  A comprehensive ROS was completed and negative except as noted per HPI   Allergies  Allergen Reactions   Viagra [Sildenafil Citrate] Other (See Comments)    COLOR DISCRIMINATION [DOSE RELATED] "blue sensation"   Metoprolol Other (See Comments)    Orthostasis, near syncope    Past Medical History:  Diagnosis Date   Allergic conjunctivitis of left eye 10/16/2019   Aortic stenosis    moderate AS 12/01/15 echo (Dr. Elberta Fortis)   Arthritis    Bilateral leg edema 09/24/2017   Chronic constipation 09/03/2021   CKD (chronic kidney disease) stage 3, GFR 30-59 ml/min (HCC) 08/04/2016   CKD stage G3a/A1, GFR 45-59 and albumin creatinine ratio <30 mg/g (HCC) 08/04/2016   Colon cancer screening declined 05/27/2018   Combined form of age-related cataract, both eyes 08/12/2018   Coronary artery disease    Diastolic dysfunction without heart failure 08/19/2017   Dyslipidemia, goal LDL below 70 03/18/2017   Dysthymia 03/18/2017   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/07/2017   Mild, repeat in 5 years (2023)   Family history of prostate cancer in father 03/02/2019   Former smoker, stopped smoking in distant past    Heart murmur    History of colon polyps    History of colonic polyps 03/02/2019   Hyperlipidemia     Hypertension goal BP (blood pressure) < 140/90 10/13/2015   Hypertensive retinopathy of both eyes 08/12/2018   Intraoperative floppy iris syndrome (IFIS) 03/02/2021   Irritability and anger 03/18/2017   Lower urinary tract symptoms (LUTS) 04/29/2017   Posterior vitreous detachment, left eye 08/12/2018   Postural dizziness with presyncope 08/27/2016   Psoriasis    S/P AVR 09/09/2017   Edwards Lifesciences 23 mm, model 3300TFX, serial #[id number]     SBE (subacute bacterial endocarditis) prophylaxis candidate 10/06/2018   Sleep apnea    to pick cpap tomorrow 12/13/15   Status post carotid endarterectomy 12/15/2015   Right sided B/l plaques <50% velocity (09/2016)   Status post CVA 08/03/2016   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   Stroke (HCC) 09/2015   Symptomatic carotid artery stenosis 12/15/2015   Urinary retention due to benign prostatic hyperplasia 09/13/2017   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   Wears glasses     Past Surgical History:  Procedure Laterality Date   AORTIC VALVE REPLACEMENT N/A 09/09/2017   Procedure: AORTIC VALVE REPLACEMENT (AVR) using a 23 Edwards Perimount Magna Ease Aortic Valve;  Surgeon: Delight Ovens, MD;  Location: [facility] OR;  Service: Open Heart Surgery;  Laterality: N/A;   COLONOSCOPY W/ BIOPSIES AND POLYPECTOMY     CORONARY ARTERY BYPASS GRAFT N/A 09/09/2017   Procedure: CORONARY ARTERY BYPASS GRAFTING (CABG) x  4 using the left internal mammary artery and right greater saphenous vein harvested endoscopically.;  Surgeon: Delight Ovens, MD;  Location: [facility] OR;  Service: Open Heart Surgery;  Laterality: N/A;   ENDARTERECTOMY Right 12/15/2015   Procedure: RIGHT CAROTID ENDARTERECTOMY WITH PATCH ANGIOPLASTY;  Surgeon: Nada Libman, MD;  Location: [facility] OR;  Service: Vascular;  Laterality: Right;   EYE SURGERY Bilateral    Right 03/02/2021 Left 03/09/2021 Dr Hardie Shackleton   [facility] ANGIOPLASTY Right 12/15/2015   Procedure: RIGHT  CAROTID PATCH ANGIOPLASTY;  Surgeon: Nada Libman, MD;  Location: [facility] OR;  Service: Vascular;  Laterality: Right;   RIGHT/LEFT HEART CATH AND CORONARY ANGIOGRAPHY N/A 08/23/2017   Procedure: RIGHT/LEFT HEART CATH AND CORONARY ANGIOGRAPHY;  Surgeon: Swaziland, Peter M, MD;  Location: [facility] INVASIVE CV LAB;  Service: Cardiovascular;  Laterality: N/A;   TEE WITHOUT CARDIOVERSION N/A 09/09/2017   Procedure: TRANSESOPHAGEAL ECHOCARDIOGRAM (TEE);  Surgeon: Delight Ovens, MD;  Location: [facility] OR;  Service: Open Heart Surgery;  Laterality: N/A;   WISDOM TOOTH EXTRACTION      Social History   Socioeconomic History   Marital status: Married    Spouse name: [name]   Number of children: 2   Years of education: 13   Highest education level: Not on file  Occupational History   Occupation: Retired    Comment: retired Clinical biochemist, [employer]  Tobacco Use   Smoking status: Former    Current packs/day: 0.00    Types: Cigarettes    Quit date: 08/07/1983    Years since quitting: 39.6   Smokeless tobacco: Never  Vaping Use   Vaping status: Never Used  Substance and Sexual Activity   Alcohol use: Yes    Comment: 1 glass of wine daily    Drug use: No   Sexual activity: Not Currently  Other Topics Concern   Not on file  Social History Narrative   Lives with wife   Caffeine use- coffee 3-4 cups daily   Social Determinants of Health   Financial Resource Strain: Not on file  Food Insecurity: Not on file  Transportation Needs: Not on file  Physical Activity: Not on file  Stress: No Stress Concern Present (02/22/2021)   Received from [facility], [facility]   [employer] of Occupational Health - Occupational Stress Questionnaire    Feeling of Stress : Not at all  Social Connections: Unknown (12/19/2021)   Received from [facility], [facility]   Social Network    Social Network: Not on file    Family History  Problem Relation Age of Onset   Cancer Mother        ovarian cancer    Cancer Father        Prostate    Health Maintenance  Topic Date Due   COVID-19 Vaccine (7 - 2023-24 season) 06/21/2023 (Originally 07/23/2022)   Medicare Annual Wellness (AWV)  06/21/2023 (Originally 03/03/2021)   Pneumonia Vaccine 36+ Years old  Completed   INFLUENZA VACCINE  Completed   Hepatitis C Screening  Completed   Zoster Vaccines- Shingrix  Completed   HPV VACCINES  Aged Out   DTaP/Tdap/Td  Discontinued     ----------------------------------------------------------------------------------------------------------------------------------------------------------------------------------------------------------------- Physical Exam BP (!) 161/80   Pulse 67   Ht 6' (1.829 m)   Wt 192 lb (87.1 kg)   SpO2 97%   BMI 26.04 kg/m   Physical Exam Constitutional:      Appearance: Normal appearance.  HENT:     Head: Normocephalic and atraumatic.  Eyes:     General: No scleral icterus. Cardiovascular:     Rate and Rhythm: Normal rate and regular rhythm.  Pulmonary:     Effort: Pulmonary effort is normal.     Breath sounds: Normal breath sounds.  Neurological:     Mental Status: He is alert.  Psychiatric:        Mood and Affect: Mood normal.        Behavior: Behavior normal.     ------------------------------------------------------------------------------------------------------------------------------------------------------------------------------------------------------------------- Assessment and Plan  Ectatic abdominal aorta (HCC) Updated [place] ordered.   CKD (chronic kidney disease) stage 3, GFR 30-59 ml/min (HCC) Update renal function.   Hyperlipidemia Doing well with atorvastatin .  Update lipids.   Status post CVA Continue management of risk factors.  Continue statin, and asa.   Hypertension goal BP (blood pressure) < 140/90 BP remains elevated in clinic.  Reports better numbers at home and cuff has been verified in clinic.  Low sodium diet and continued  monitoring at home recommended.    No orders of the defined types were placed in this encounter.   Return in about 6 months (around 09/21/2023) for Annual exam/Fasting labs.    This visit occurred during the SARS-CoV-2 public health emergency.  Safety protocols were in place, including screening questions prior to the visit, additional usage of staff PPE, and extensive cleaning of exam room while observing appropriate contact time as indicated for disinfecting solutions.

## 2023-03-21 NOTE — Assessment & Plan Note (Addendum)
-  Doing well with atorvastatin -Update lipids

## 2023-03-21 NOTE — Assessment & Plan Note (Signed)
Continue management of risk factors.  Continue statin, and asa.

## 2023-03-21 NOTE — Assessment & Plan Note (Signed)
Update renal function.  

## 2023-03-21 NOTE — Assessment & Plan Note (Signed)
BP remains elevated in clinic.  Reports better numbers at home and cuff has been verified in clinic.  Low sodium diet and continued monitoring at home recommended.

## 2023-03-22 LAB — CMP14+EGFR
ALT: 21 IU/L (ref 0–44)
AST: 20 IU/L (ref 0–40)
Albumin: 4.4 g/dL (ref 3.8–4.8)
Alkaline Phosphatase: 96 IU/L (ref 44–121)
BUN/Creatinine Ratio: 22 (ref 10–24)
BUN: 33 mg/dL — ABNORMAL HIGH (ref 8–27)
Bilirubin Total: 0.6 mg/dL (ref 0.0–1.2)
CO2: 21 mmol/L (ref 20–29)
Calcium: 9.5 mg/dL (ref 8.6–10.2)
Chloride: 107 mmol/L — ABNORMAL HIGH (ref 96–106)
Creatinine, Ser: 1.53 mg/dL — ABNORMAL HIGH (ref 0.76–1.27)
Globulin, Total: 2.3 g/dL (ref 1.5–4.5)
Glucose: 95 mg/dL (ref 70–99)
Potassium: 4.5 mmol/L (ref 3.5–5.2)
Sodium: 141 mmol/L (ref 134–144)
Total Protein: 6.7 g/dL (ref 6.0–8.5)
eGFR: 46 mL/min/{1.73_m2} — ABNORMAL LOW (ref >59)

## 2023-03-22 LAB — CBC WITH DIFFERENTIAL/PLATELET
Basophils Absolute: 0 10*3/uL (ref 0.0–0.2)
Basos: 1 %
EOS (ABSOLUTE): 0.3 10*3/uL (ref 0.0–0.4)
Eos: 6 %
Hematocrit: 41.7 % (ref 37.5–51.0)
Hemoglobin: 14.2 g/dL (ref 13.0–17.7)
Immature Grans (Abs): 0 10*3/uL (ref 0.0–0.1)
Immature Granulocytes: 0 %
Lymphocytes Absolute: 1.6 10*3/uL (ref 0.7–3.1)
Lymphs: 27 %
MCH: 31.2 pg (ref 26.6–33.0)
MCHC: 34.1 g/dL (ref 31.5–35.7)
MCV: 92 fL (ref 79–97)
Monocytes Absolute: 0.5 10*3/uL (ref 0.1–0.9)
Monocytes: 8 %
Neutrophils Absolute: 3.5 10*3/uL (ref 1.4–7.0)
Neutrophils: 58 %
Platelets: 184 10*3/uL (ref 150–450)
RBC: 4.55 x10E6/uL (ref 4.14–5.80)
RDW: 12.2 % (ref 11.6–15.4)
WBC: 5.9 10*3/uL (ref 3.4–10.8)

## 2023-03-22 LAB — LIPID PANEL WITH LDL/HDL RATIO
Cholesterol, Total: 158 mg/dL (ref 100–199)
HDL: 53 mg/dL (ref >39)
LDL Chol Calc (NIH): 82 mg/dL (ref 0–99)
LDL/HDL Ratio: 1.5 ratio (ref 0.0–3.6)
Triglycerides: 130 mg/dL (ref 0–149)
VLDL Cholesterol Cal: 23 mg/dL (ref 5–40)

## 2023-03-22 LAB — PSA: Prostate Specific Ag, Serum: 0.8 ng/mL (ref 0.0–4.0)

## 2023-03-28 ENCOUNTER — Ambulatory Visit (INDEPENDENT_AMBULATORY_CARE_PROVIDER_SITE_OTHER): Payer: Medicare HMO

## 2023-03-28 DIAGNOSIS — I77811 Abdominal aortic ectasia: Secondary | ICD-10-CM

## 2023-03-30 ENCOUNTER — Encounter: Payer: Self-pay | Admitting: Family Medicine

## 2023-04-01 MED ORDER — ALFUZOSIN HCL ER 10 MG PO TB24: ORAL_TABLET | ORAL | 1 refills | Status: DC

## 2023-04-05 ENCOUNTER — Other Ambulatory Visit: Payer: Self-pay

## 2023-09-04 ENCOUNTER — Ambulatory Visit (INDEPENDENT_AMBULATORY_CARE_PROVIDER_SITE_OTHER): Payer: Medicare HMO | Admitting: Family Medicine

## 2023-09-04 ENCOUNTER — Encounter: Payer: Self-pay | Admitting: Family Medicine

## 2023-09-04 VITALS — BP 129/80 | HR 83 | Ht 72.0 in | Wt 193.0 lb

## 2023-09-04 DIAGNOSIS — N401 Enlarged prostate with lower urinary tract symptoms: Secondary | ICD-10-CM | POA: Diagnosis not present

## 2023-09-04 DIAGNOSIS — D485 Neoplasm of uncertain behavior of skin: Secondary | ICD-10-CM | POA: Insufficient documentation

## 2023-09-04 DIAGNOSIS — R338 Other retention of urine: Secondary | ICD-10-CM | POA: Diagnosis not present

## 2023-09-04 HISTORY — DX: Neoplasm of uncertain behavior of skin: D48.5

## 2023-09-04 MED ORDER — ALFUZOSIN HCL ER 10 MG PO TB24: ORAL_TABLET | ORAL | 1 refills | Status: DC

## 2023-09-04 NOTE — Assessment & Plan Note (Signed)
Concern for BCC.  Referral made to dermatology.

## 2023-09-04 NOTE — Progress Notes (Signed)
Leonard Padilla - 78 y.o. male MRN [id number]  Date of birth: Dec 10, 1945  Subjective Chief Complaint  Patient presents with   Foreign Body in Ear    HPI Leonard Padilla is a 78 y.o. male here today with complaint of lesion on R ear.  Reports that lesion has been present for months. Squeezed lesion and white substance came out.  Has some mild pain and intermittent bleeding around the area.  No prior history of skin cancers.    ROS:  A comprehensive ROS was completed and negative except as noted per HPI  Allergies  Allergen Reactions   Viagra [Sildenafil Citrate] Other (See Comments)    COLOR DISCRIMINATION [DOSE RELATED] "blue sensation"   Metoprolol Other (See Comments)    Orthostasis, near syncope    Past Medical History:  Diagnosis Date   Allergic conjunctivitis of left eye 10/16/2019   Aortic stenosis    moderate AS 12/01/15 echo (Dr. Elberta Fortis)   Arthritis    Bilateral leg edema 09/24/2017   Chronic constipation 09/03/2021   CKD (chronic kidney disease) stage 3, GFR 30-59 ml/min (HCC) 08/04/2016   CKD stage G3a/A1, GFR 45-59 and albumin creatinine ratio <30 mg/g (HCC) 08/04/2016   Colon cancer screening declined 05/27/2018   Combined form of age-related cataract, both eyes 08/12/2018   Coronary artery disease    Diastolic dysfunction without heart failure 08/19/2017   Dyslipidemia, goal LDL below 70 03/18/2017   Dysthymia 03/18/2017   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/07/2017   Mild, repeat in 5 years (2023)   Family history of prostate cancer in father 03/02/2019   Former smoker, stopped smoking in distant past    Heart murmur    History of colon polyps    History of colonic polyps 03/02/2019   Hyperlipidemia    Hypertension goal BP (blood pressure) < 140/90 10/13/2015   Hypertensive retinopathy of both eyes 08/12/2018   Intraoperative floppy iris syndrome (IFIS) 03/02/2021   Irritability and anger 03/18/2017   Lower urinary tract symptoms (LUTS) 04/29/2017   Posterior  vitreous detachment, left eye 08/12/2018   Postural dizziness with presyncope 08/27/2016   Psoriasis    S/P AVR 09/09/2017   Edwards Lifesciences 23 mm, model 3300TFX, serial #[id number]     SBE (subacute bacterial endocarditis) prophylaxis candidate 10/06/2018   Sleep apnea    to pick cpap tomorrow 12/13/15   Status post carotid endarterectomy 12/15/2015   Right sided B/l plaques <50% velocity (09/2016)   Status post CVA 08/03/2016   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   Stroke (HCC) 09/2015   Symptomatic carotid artery stenosis 12/15/2015   Urinary retention due to benign prostatic hyperplasia 09/13/2017   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   Wears glasses     Past Surgical History:  Procedure Laterality Date   AORTIC VALVE REPLACEMENT N/A 09/09/2017   Procedure: AORTIC VALVE REPLACEMENT (AVR) using a 23 Edwards Perimount Magna Ease Aortic Valve;  Surgeon: Delight Ovens, MD;  Location: [facility] OR;  Service: Open Heart Surgery;  Laterality: N/A;   COLONOSCOPY W/ BIOPSIES AND POLYPECTOMY     CORONARY ARTERY BYPASS GRAFT N/A 09/09/2017   Procedure: CORONARY ARTERY BYPASS GRAFTING (CABG) x 4 using the left internal mammary artery and right greater saphenous vein harvested endoscopically.;  Surgeon: Delight Ovens, MD;  Location: [facility] OR;  Service: Open Heart Surgery;  Laterality: N/A;   ENDARTERECTOMY Right 12/15/2015   Procedure: RIGHT CAROTID ENDARTERECTOMY WITH PATCH ANGIOPLASTY;  Surgeon: Nada Libman, MD;  Location: [facility] OR;  Service: Vascular;  Laterality: Right;   EYE SURGERY Bilateral    Right 03/02/2021 Left 03/09/2021 Dr Hardie Shackleton   [facility] ANGIOPLASTY Right 12/15/2015   Procedure: RIGHT CAROTID PATCH ANGIOPLASTY;  Surgeon: Nada Libman, MD;  Location: [facility] OR;  Service: Vascular;  Laterality: Right;   RIGHT/LEFT HEART CATH AND CORONARY ANGIOGRAPHY N/A 08/23/2017   Procedure: RIGHT/LEFT HEART CATH AND CORONARY ANGIOGRAPHY;  Surgeon: Swaziland, Peter M, MD;   Location: [facility] INVASIVE CV LAB;  Service: Cardiovascular;  Laterality: N/A;   TEE WITHOUT CARDIOVERSION N/A 09/09/2017   Procedure: TRANSESOPHAGEAL ECHOCARDIOGRAM (TEE);  Surgeon: Delight Ovens, MD;  Location: [facility] OR;  Service: Open Heart Surgery;  Laterality: N/A;   WISDOM TOOTH EXTRACTION      Social History   Socioeconomic History   Marital status: Married    Spouse name: [name]   Number of children: 2   Years of education: 13   Highest education level: Some college, no degree  Occupational History   Occupation: Retired    Comment: retired Clinical biochemist, [employer]  Tobacco Use   Smoking status: Former    Current packs/day: 0.00    Types: Cigarettes    Quit date: 08/07/1983    Years since quitting: 40.1   Smokeless tobacco: Never  Vaping Use   Vaping status: Never Used  Substance and Sexual Activity   Alcohol use: Yes    Comment: 1 glass of wine daily    Drug use: No   Sexual activity: Not Currently  Other Topics Concern   Not on file  Social History Narrative   Lives with wife   Caffeine use- coffee 3-4 cups daily   Social Drivers of Health   Financial Resource Strain: Patient Declined (09/01/2023)   Overall Financial Resource Strain (CARDIA)    Difficulty of Paying Living Expenses: Patient declined  Food Insecurity: No Food Insecurity (09/01/2023)   Hunger Vital Sign    Worried About Running Out of Food in the Last Year: Never true    Ran Out of Food in the Last Year: Never true  Transportation Needs: No Transportation Needs (09/01/2023)   PRAPARE - Administrator, Civil Service (Medical): No    Lack of Transportation (Non-Medical): No  Physical Activity: Unknown (09/01/2023)   Exercise Vital Sign    Days of Exercise per Week: 0 days    Minutes of Exercise per Session: Not on file  Stress: No Stress Concern Present (09/01/2023)   [employer] of Occupational Health - Occupational Stress Questionnaire    Feeling of Stress : Only a little  Social  Connections: Unknown (09/01/2023)   Social Connection and Isolation Panel [NHANES]    Frequency of Communication with Friends and Family: Once a week    Frequency of Social Gatherings with Friends and Family: Patient declined    Attends Religious Services: Patient declined    Database administrator or Organizations: No    Attends Engineer, structural: Not on file    Marital Status: Married    Family History  Problem Relation Age of Onset   Cancer Mother        ovarian cancer   Cancer Father        Prostate    Health Maintenance  Topic Date Due   Medicare Annual Wellness (AWV)  03/03/2021   COVID-19 Vaccine (7 - 2024-25 season) 09/19/2024 (Originally 04/07/2023)   Pneumonia Vaccine 27+ Years old  Completed   INFLUENZA VACCINE  Completed  Hepatitis C Screening  Completed   Zoster Vaccines- Shingrix  Completed   HPV VACCINES  Aged Out   DTaP/Tdap/Td  Discontinued     ----------------------------------------------------------------------------------------------------------------------------------------------------------------------------------------------------------------- Physical Exam BP 129/80 (BP Location: Left Arm, Patient Position: Sitting, Cuff Size: Normal)   Pulse 83   Ht 6' (1.829 m)   Wt 193 lb (87.5 kg)   SpO2 95%   BMI 26.18 kg/m   Physical Exam Constitutional:      Appearance: Normal appearance.  HENT:     Head: Normocephalic and atraumatic.  Skin:    Comments: Scabbed over, Ulcerated lesion on R ear.  See images.   Neurological:     Mental Status: He is alert.        ------------------------------------------------------------------------------------------------------------------------------------------------------------------------------------------------------------------- Assessment and Plan  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 Concern for BCC.  Referral made to dermatology.    Meds ordered this encounter  Medications    alfuzosin (UROXATRAL) 10 MG 24 hr tablet    Sig: TAKE 1 TABLET BY MOUTH EVERY DAY WITH BREAKFAST    Dispense:  90 tablet    Refill:  1    No follow-ups on file.    This visit occurred during the SARS-CoV-2 public health emergency.  Safety protocols were in place, including screening questions prior to the visit, additional usage of staff PPE, and extensive cleaning of exam room while observing appropriate contact time as indicated for disinfecting solutions.

## 2023-09-04 NOTE — Patient Instructions (Signed)
Basal Cell Carcinoma Basal cell carcinoma is the most common form of skin cancer. It begins in the basal cells, which are at the bottom of the outer skin layer (epidermis). Basal cell carcinoma can often be cured. It rarely spreads to other areas of the body (metastasizes). It may come back at the same location (recur), but it can be treated again if this happens. Basal cell carcinoma occurs most often on parts of the body that are frequently exposed to the sun, such as: Parts of the head, including the scalp or face. Ears. Neck. Arms or legs. Backs of the hands. What are the causes? This condition is usually caused by exposure to ultraviolet (UV) light. UV light may come from the sun or from tanning beds. Other causes include: Exposure to arsenic, a highly poisonous metal. Exposure to high-energy X-rays (radiation). Exposure to toxic tars and oils. Certain genetic conditions, such as a condition that makes a person sensitive to sunlight. What increases the risk? You are more likely to develop this condition if: You are older than 80 years of age. You have: Fair skin (light complexion), blond or red hair, or blue, green, or gray eye color. Childhood freckling. Had repeated sunburns or sun exposure over long periods of time, especially during childhood. A weakened body defense system (immune system). Been exposed to certain chemicals, such as tar, soot, and arsenic. Chronic inflammatory conditions or infections. A family or personal history of basal cell carcinoma. You use tanning beds. What are the signs or symptoms? The main symptom of this condition is a growth or lesion on the skin. The shape and color of the growth or lesion may vary. The main types include: An open sore that may remain open for three weeks or longer. The sore may bleed or crust. This type of lesion can be an early sign of basal cell carcinoma. Basal cell carcinoma often shows up as a sore that does not heal. A  reddish area that may crust, itch, or cause discomfort. This may occur on areas that are exposed to the sun. These patches might be easier to feel than to see. A shiny or clear bump that is red, white, or pink. In people who have dark hair, the bump is often tan, black, or brown. These bumps can look like moles. A pink growth with a raised border. The growth will have a crusted and indented area in the center. Small blood vessels may appear on the surface of the growth as it gets bigger. A scar-like area that looks like shiny, stretched skin. The area may be white, yellow, or waxy. It often has irregular borders. This may be a sign of more aggressive basal cell carcinoma. How is this diagnosed? This condition may be diagnosed with: A physical exam. Removal of a tissue sample to be examined under a microscope (biopsy). How is this treated? Treatment for this condition involves removing the cancerous tissue. The method that is used for this depends on the type, size, location, and number of tumors. Possible treatments include: Surgery, such as: Mohs surgery. In this procedure, the cancerous skin cells are removed layer by layer until all of the tumor has been removed. Surgical removal (excision) of the tumor. This involves removing the entire tumor and a small amount of normal skin that surrounds it. Cryosurgery. This involves freezing the tumor with liquid nitrogen. Plastic surgery. The tumor is removed, and healthy skin from another part of the body is used to cover the wound. This  may be done for large tumors that are in areas where it is not possible to stretch the nearby skin to sew the edges of the wound together. Therapies or treatments, such as: Radiation. This may be used for tumors on the face. Photodynamic therapy. A chemical cream is applied to the skin, and light exposure is used to activate the chemical. Electrodesiccation and curettage. This involves alternately scraping and burning  the tumor while using an electric current to control bleeding. Chemical treatments, such as imiquimod cream and interferon injections. These may be used to remove superficial tumors with minimal scarring. Follow these instructions at home: Avoid direct exposure to the sun. Do self-exams as told by your health care provider. Look for new spots or changes in your skin. Keep all follow-up visits. This is important. How is this prevented?  Avoid the sun when it is the strongest. This is usually between 10 a.m. and 4 p.m. When you are out in the sun, use a sunscreen that has a sun protection factor (SPF) of at least 30. Apply sunscreen at least 30 minutes before exposure to the sun. Reapply sunscreen every 2-4 hours while you are outside. Also reapply it after swimming and after excessive sweating. Always wear hats, protective clothing, and UV-blocking sunglasses when you are outdoors. Do not use tanning beds. Contact a health care provider if: You notice any new spots or any changes in your skin. You have had a basal cell carcinoma tumor removed, and you notice a new growth in the same location. Get help right away if: You have a spot that is sore and does not heal. You have a spot that bleeds easily. Summary Basal cell carcinoma is the most common form of skin cancer. It begins in the bottom of the outer skin layer (epidermis). Basal cell carcinoma can almost always be cured. This condition is usually caused by exposure to ultraviolet (UV) light. It mostly affects the face, scalp, neck, ears, arms, legs, or backs of the hands. The main symptom of this condition is a growth or lesion on the skin that can vary in shape and color. You can prevent this cancer by avoiding direct exposure to the sun, applying sunscreen of at least 30 SPF, and wearing protective clothing. Apply sunscreen 30 minutes before you go out into the sun, and reapply every 2-4 hours while you are outside. This information is  not intended to replace advice given to you by your health care provider. Make sure you discuss any questions you have with your health care provider. Document Revised: 11/24/2020 Document Reviewed: 11/24/2020 Elsevier Patient Education  2024 [employer].

## 2023-09-19 ENCOUNTER — Other Ambulatory Visit: Payer: Self-pay | Admitting: Family Medicine

## 2023-09-19 DIAGNOSIS — N401 Enlarged prostate with lower urinary tract symptoms: Secondary | ICD-10-CM

## 2023-09-25 ENCOUNTER — Encounter: Payer: Self-pay | Admitting: Family Medicine

## 2023-09-25 ENCOUNTER — Ambulatory Visit (INDEPENDENT_AMBULATORY_CARE_PROVIDER_SITE_OTHER): Payer: Medicare HMO | Admitting: Family Medicine

## 2023-09-25 VITALS — BP 106/70 | HR 82 | Ht 67.72 in | Wt 188.1 lb

## 2023-09-25 DIAGNOSIS — E782 Mixed hyperlipidemia: Secondary | ICD-10-CM | POA: Diagnosis not present

## 2023-09-25 DIAGNOSIS — Z8673 Personal history of transient ischemic attack (TIA), and cerebral infarction without residual deficits: Secondary | ICD-10-CM

## 2023-09-25 DIAGNOSIS — Z0000 Encounter for general adult medical examination without abnormal findings: Secondary | ICD-10-CM | POA: Diagnosis not present

## 2023-09-25 DIAGNOSIS — I10 Essential (primary) hypertension: Secondary | ICD-10-CM | POA: Diagnosis not present

## 2023-09-25 DIAGNOSIS — R399 Unspecified symptoms and signs involving the genitourinary system: Secondary | ICD-10-CM

## 2023-09-25 NOTE — Progress Notes (Signed)
 Leonard Padilla - 78 y.o. male MRN [id number]  Date of birth: Dec 10, 1945  Subjective Chief Complaint  Patient presents with   Annual Exam    HPI Leonard Padilla is a 78 y.o. male here today for annual exam.   He reports that he is doing pretty well.  Recent biopsy of ear lesion with SCC.  Has appt for surgical excision.   Continues to remains moderately active.  He feels that his diet is pretty good.   Remote history of smoking.  1 glass of wine nightly.   Review of Systems  Constitutional:  Negative for chills, fever, malaise/fatigue and weight loss.  HENT:  Negative for congestion, ear pain and sore throat.   Eyes:  Negative for blurred vision, double vision and pain.  Respiratory:  Negative for cough and shortness of breath.   Cardiovascular:  Negative for chest pain and palpitations.  Gastrointestinal:  Negative for abdominal pain, blood in stool, constipation, heartburn and nausea.  Genitourinary:  Negative for dysuria and urgency.  Musculoskeletal:  Negative for joint pain and myalgias.  Neurological:  Negative for dizziness and headaches.  Endo/Heme/Allergies:  Does not bruise/bleed easily.  Psychiatric/Behavioral:  Negative for depression. The patient is not nervous/anxious and does not have insomnia.     Allergies  Allergen Reactions   Viagra [Sildenafil Citrate] Other (See Comments)    COLOR DISCRIMINATION [DOSE RELATED] "blue sensation"   Metoprolol Other (See Comments)    Orthostasis, near syncope    Past Medical History:  Diagnosis Date   Allergic conjunctivitis of left eye 10/16/2019   Aortic stenosis    moderate AS 12/01/15 echo (Dr. Elberta Fortis)   Arthritis    Bilateral leg edema 09/24/2017   Chronic constipation 09/03/2021   CKD (chronic kidney disease) stage 3, GFR 30-59 ml/min (HCC) 08/04/2016   CKD stage G3a/A1, GFR 45-59 and albumin creatinine ratio <30 mg/g (HCC) 08/04/2016   Colon cancer screening declined 05/27/2018   Combined form of age-related  cataract, both eyes 08/12/2018   Coronary artery disease    Diastolic dysfunction without heart failure 08/19/2017   Dyslipidemia, goal LDL below 70 03/18/2017   Dysthymia 03/18/2017   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/07/2017   Mild, repeat in 5 years (2023)   Family history of prostate cancer in father 03/02/2019   Former smoker, stopped smoking in distant past    Heart murmur    History of colon polyps    History of colonic polyps 03/02/2019   Hyperlipidemia    Hypertension goal BP (blood pressure) < 140/90 10/13/2015   Hypertensive retinopathy of both eyes 08/12/2018   Intraoperative floppy iris syndrome (IFIS) 03/02/2021   Irritability and anger 03/18/2017   Lower urinary tract symptoms (LUTS) 04/29/2017   Posterior vitreous detachment, left eye 08/12/2018   Postural dizziness with presyncope 08/27/2016   Psoriasis    S/P AVR 09/09/2017   Edwards Lifesciences 23 mm, model 3300TFX, serial #[id number]     SBE (subacute bacterial endocarditis) prophylaxis candidate 10/06/2018   Sleep apnea    to pick cpap tomorrow 12/13/15   Status post carotid endarterectomy 12/15/2015   Right sided B/l plaques <50% velocity (09/2016)   Status post CVA 08/03/2016   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   Stroke (HCC) 09/2015   Symptomatic carotid artery stenosis 12/15/2015   Urinary retention due to benign prostatic hyperplasia 09/13/2017   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   Wears glasses     Past Surgical History:  Procedure Laterality Date  AORTIC VALVE REPLACEMENT N/A 09/09/2017   Procedure: AORTIC VALVE REPLACEMENT (AVR) using a 23 Edwards Perimount Magna Ease Aortic Valve;  Surgeon: Delight Ovens, MD;  Location: [facility] OR;  Service: Open Heart Surgery;  Laterality: N/A;   COLONOSCOPY W/ BIOPSIES AND POLYPECTOMY     CORONARY ARTERY BYPASS GRAFT N/A 09/09/2017   Procedure: CORONARY ARTERY BYPASS GRAFTING (CABG) x 4 using the left internal mammary artery and  right greater saphenous vein harvested endoscopically.;  Surgeon: Delight Ovens, MD;  Location: [facility] OR;  Service: Open Heart Surgery;  Laterality: N/A;   ENDARTERECTOMY Right 12/15/2015   Procedure: RIGHT CAROTID ENDARTERECTOMY WITH PATCH ANGIOPLASTY;  Surgeon: Nada Libman, MD;  Location: [facility] OR;  Service: Vascular;  Laterality: Right;   EYE SURGERY Bilateral    Right 03/02/2021 Left 03/09/2021 Dr Hardie Shackleton   [facility] ANGIOPLASTY Right 12/15/2015   Procedure: RIGHT CAROTID PATCH ANGIOPLASTY;  Surgeon: Nada Libman, MD;  Location: [facility] OR;  Service: Vascular;  Laterality: Right;   RIGHT/LEFT HEART CATH AND CORONARY ANGIOGRAPHY N/A 08/23/2017   Procedure: RIGHT/LEFT HEART CATH AND CORONARY ANGIOGRAPHY;  Surgeon: Swaziland, Peter M, MD;  Location: [facility] INVASIVE CV LAB;  Service: Cardiovascular;  Laterality: N/A;   TEE WITHOUT CARDIOVERSION N/A 09/09/2017   Procedure: TRANSESOPHAGEAL ECHOCARDIOGRAM (TEE);  Surgeon: Delight Ovens, MD;  Location[facility] OR;  Service: Open Heart Surgery;  Laterality: N/A;   WISDOM TOOTH EXTRACTION      Social History   Socioeconomic History   Marital status: Married    Spouse name: [name]   Number of children: 2   Years of education: 13   Highest education level: Some college, no degree  Occupational History   Occupation: Retired    Comment: retired Clinical biochemist, [employer]  Tobacco Use   Smoking status: Former    Current packs/day: 0.00    Types: Cigarettes    Quit date: 08/07/1983    Years since quitting: 40.1   Smokeless tobacco: Never  Vaping Use   Vaping status: Never Used  Substance and Sexual Activity   Alcohol use: Yes    Comment: 1 glass of wine daily    Drug use: No   Sexual activity: Not Currently  Other Topics Concern   Not on file  Social History Narrative   Lives with wife   Caffeine use- coffee 3-4 cups daily   Social Drivers of Health   Financial Resource Strain: Patient Declined (09/01/2023)   Overall Financial Resource Strain (CARDIA)     Difficulty of Paying Living Expenses: Patient declined  Food Insecurity: No Food Insecurity (09/01/2023)   Hunger Vital Sign    Worried About Running Out of Food in the Last Year: Never true    Ran Out of Food in the Last Year: Never true  Transportation Needs: No Transportation Needs (09/01/2023)   PRAPARE - Administrator, Civil Service (Medical): No    Lack of Transportation (Non-Medical): No  Physical Activity: Unknown (09/01/2023)   Exercise Vital Sign    Days of Exercise per Week: 0 days    Minutes of Exercise per Session: Not on file  Stress: No Stress Concern Present (09/01/2023)   [employer] of Occupational Health - Occupational Stress Questionnaire    Feeling of Stress : Only a little  Social Connections: Unknown (09/01/2023)   Social Connection and Isolation Panel [NHANES]    Frequency of Communication with Friends and Family: Once a week    Frequency of Social Gatherings with Friends and  Family: Patient declined    Attends Religious Services: Patient declined    Active Member of Clubs or Organizations: No    Attends Engineer, structural: Not on file    Marital Status: Married    Family History  Problem Relation Age of Onset   Cancer Mother        ovarian cancer   Cancer Father        Prostate    Health Maintenance  Topic Date Due   Medicare Annual Wellness (AWV)  03/03/2021   COVID-19 Vaccine (7 - 2024-25 season) 09/19/2024 (Originally 04/07/2023)   Pneumonia Vaccine 60+ Years old  Completed   INFLUENZA VACCINE  Completed   Hepatitis C Screening  Completed   Zoster Vaccines- Shingrix  Completed   HPV VACCINES  Aged Out   DTaP/Tdap/Td  Discontinued     ----------------------------------------------------------------------------------------------------------------------------------------------------------------------------------------------------------------- Physical Exam BP 106/70 (BP Location: Left Arm, Patient Position: Sitting,  Cuff Size: Normal)   Pulse 82   Ht 5' 7.72" (1.72 m)   Wt 188 lb 1.6 oz (85.3 kg)   SpO2 98%   BMI 28.84 kg/m   Physical Exam Constitutional:      General: He is not in acute distress. HENT:     Head: Normocephalic and atraumatic.     Right Ear: Tympanic membrane and external ear normal.     Left Ear: Tympanic membrane and external ear normal.  Eyes:     General: No scleral icterus. Neck:     Thyroid: No thyromegaly.  Cardiovascular:     Rate and Rhythm: Normal rate and regular rhythm.     Heart sounds: Normal heart sounds.  Pulmonary:     Effort: Pulmonary effort is normal.     Breath sounds: Normal breath sounds.  Abdominal:     General: Bowel sounds are normal. There is no distension.     Palpations: Abdomen is soft.     Tenderness: There is no abdominal tenderness. There is no guarding.  Musculoskeletal:     Cervical back: Normal range of motion.  Lymphadenopathy:     Cervical: No cervical adenopathy.  Skin:    General: Skin is warm and dry.     Findings: No rash.  Neurological:     Mental Status: He is alert and oriented to person, place, and time.     Cranial Nerves: No cranial nerve deficit.     Motor: No abnormal muscle tone.  Psychiatric:        Mood and Affect: Mood normal.        Behavior: Behavior normal.     ------------------------------------------------------------------------------------------------------------------------------------------------------------------------------------------------------------------- Assessment and Plan  Well adult exam Well adult Orders Placed This Encounter  Procedures   CMP14+EGFR   CBC with Differential/Platelet   Lipid Panel With LDL/HDL Ratio   PSA  Screenings: per lab orders Immunizations: UTD Anticipatory guidance/Risk factor reduction:  Recommendations per AVS.    No orders of the defined types were placed in this encounter.   Return in about 6 months (around 03/24/2024) for  Hypertension.    This visit occurred during the SARS-CoV-2 public health emergency.  Safety protocols were in place, including screening questions prior to the visit, additional usage of staff PPE, and extensive cleaning of exam room while observing appropriate contact time as indicated for disinfecting solutions.

## 2023-09-25 NOTE — Assessment & Plan Note (Signed)
Well adult Orders Placed This Encounter  Procedures   CMP14+EGFR   CBC with Differential/Platelet   Lipid Panel With LDL/HDL Ratio   PSA  Screenings: per lab orders Immunizations: UTD Anticipatory guidance/Risk factor reduction:  Recommendations per AVS.

## 2023-09-25 NOTE — Patient Instructions (Signed)
 Preventive Care 73 Years and Older, Male Preventive care refers to lifestyle choices and visits with your health care provider that can promote health and wellness. Preventive care visits are also called wellness exams. What can I expect for my preventive care visit? Counseling During your preventive care visit, your health care provider may ask about your: Medical history, including: Past medical problems. Family medical history. History of falls. Current health, including: Emotional well-being. Home life and relationship well-being. Sexual activity. Memory and ability to understand (cognition). Lifestyle, including: Alcohol, nicotine or tobacco, and drug use. Access to firearms. Diet, exercise, and sleep habits. Work and work Astronomer. Sunscreen use. Safety issues such as seatbelt and bike helmet use. Physical exam Your health care provider will check your: Height and weight. These may be used to calculate your BMI (body mass index). BMI is a measurement that tells if you are at a healthy weight. Waist circumference. This measures the distance around your waistline. This measurement also tells if you are at a healthy weight and may help predict your risk of certain diseases, such as type 2 diabetes and high blood pressure. Heart rate and blood pressure. Body temperature. Skin for abnormal spots. What immunizations do I need?  Vaccines are usually given at various ages, according to a schedule. Your health care provider will recommend vaccines for you based on your age, medical history, and lifestyle or other factors, such as travel or where you work. What tests do I need? Screening Your health care provider may recommend screening tests for certain conditions. This may include: Lipid and cholesterol levels. Diabetes screening. This is done by checking your blood sugar (glucose) after you have not eaten for a while (fasting). Hepatitis C test. Hepatitis B test. HIV (human  immunodeficiency virus) test. STI (sexually transmitted infection) testing, if you are at risk. Lung cancer screening. Colorectal cancer screening. Prostate cancer screening. Abdominal aortic aneurysm (AAA) screening. You may need this if you are a current or former smoker. Talk with your health care provider about your test results, treatment options, and if necessary, the need for more tests. Follow these instructions at home: Eating and drinking  Eat a diet that includes fresh fruits and vegetables, whole grains, lean protein, and low-fat dairy products. Limit your intake of foods with high amounts of sugar, saturated fats, and salt. Take vitamin and mineral supplements as recommended by your health care provider. Do not drink alcohol if your health care provider tells you not to drink. If you drink alcohol: Limit how much you have to 0-2 drinks a day. Know how much alcohol is in your drink. In the [place] one drink equals one 12 oz bottle of beer (355 mL), one 5 oz glass of wine (148 mL), or one 1 oz glass of hard liquor (44 mL). Lifestyle Brush your teeth every morning and night with fluoride toothpaste. Floss one time each day. Exercise for at least 30 minutes 5 or more days each week. Do not use any products that contain nicotine or tobacco. These products include cigarettes, chewing tobacco, and vaping devices, such as e-cigarettes. If you need help quitting, ask your health care provider. Do not use drugs. If you are sexually active, practice safe sex. Use a condom or other form of protection to prevent STIs. Take aspirin only as told by your health care provider. Make sure that you understand how much to take and what form to take. Work with your health care provider to find out whether it is safe  and beneficial for you to take aspirin daily. Ask your health care provider if you need to take a cholesterol-lowering medicine (statin). Find healthy ways to manage stress, such  as: Meditation, yoga, or listening to music. Journaling. Talking to a trusted person. Spending time with friends and family. Safety Always wear your seat belt while driving or riding in a vehicle. Do not drive: If you have been drinking alcohol. Do not ride with someone who has been drinking. When you are tired or distracted. While texting. If you have been using any mind-altering substances or drugs. Wear a helmet and other protective equipment during sports activities. If you have firearms in your house, make sure you follow all gun safety procedures. Minimize exposure to UV radiation to reduce your risk of skin cancer. What's next? Visit your health care provider once a year for an annual wellness visit. Ask your health care provider how often you should have your eyes and teeth checked. Stay up to date on all vaccines. This information is not intended to replace advice given to you by your health care provider. Make sure you discuss any questions you have with your health care provider. Document Revised: 01/18/2021 Document Reviewed: 01/18/2021 Elsevier Patient Education  2024 [employer].

## 2023-09-26 LAB — CBC WITH DIFFERENTIAL/PLATELET
Basophils Absolute: 0 10*3/uL (ref 0.0–0.2)
Basos: 1 %
EOS (ABSOLUTE): 0.6 10*3/uL — ABNORMAL HIGH (ref 0.0–0.4)
Eos: 10 %
Hematocrit: 42.5 % (ref 37.5–51.0)
Hemoglobin: 14 g/dL (ref 13.0–17.7)
Immature Grans (Abs): 0 10*3/uL (ref 0.0–0.1)
Immature Granulocytes: 0 %
Lymphocytes Absolute: 1.7 10*3/uL (ref 0.7–3.1)
Lymphs: 32 %
MCH: 30.2 pg (ref 26.6–33.0)
MCHC: 32.9 g/dL (ref 31.5–35.7)
MCV: 92 fL (ref 79–97)
Monocytes Absolute: 0.4 10*3/uL (ref 0.1–0.9)
Monocytes: 8 %
Neutrophils Absolute: 2.7 10*3/uL (ref 1.4–7.0)
Neutrophils: 49 %
Platelets: 200 10*3/uL (ref 150–450)
RBC: 4.64 x10E6/uL (ref 4.14–5.80)
RDW: 11.9 % (ref 11.6–15.4)
WBC: 5.4 10*3/uL (ref 3.4–10.8)

## 2023-09-26 LAB — CMP14+EGFR
ALT: 45 [IU]/L — ABNORMAL HIGH (ref 0–44)
AST: 45 [IU]/L — ABNORMAL HIGH (ref 0–40)
Albumin: 4.3 g/dL (ref 3.8–4.8)
Alkaline Phosphatase: 114 [IU]/L (ref 44–121)
BUN/Creatinine Ratio: 16 (ref 10–24)
BUN: 29 mg/dL — ABNORMAL HIGH (ref 8–27)
Bilirubin Total: 0.5 mg/dL (ref 0.0–1.2)
CO2: 22 mmol/L (ref 20–29)
Calcium: 9.4 mg/dL (ref 8.6–10.2)
Chloride: 102 mmol/L (ref 96–106)
Creatinine, Ser: 1.83 mg/dL — ABNORMAL HIGH (ref 0.76–1.27)
Globulin, Total: 2.3 g/dL (ref 1.5–4.5)
Glucose: 90 mg/dL (ref 70–99)
Potassium: 4.6 mmol/L (ref 3.5–5.2)
Sodium: 139 mmol/L (ref 134–144)
Total Protein: 6.6 g/dL (ref 6.0–8.5)
eGFR: 37 mL/min/{1.73_m2} — ABNORMAL LOW (ref >59)

## 2023-09-26 LAB — LIPID PANEL WITH LDL/HDL RATIO
Cholesterol, Total: 117 mg/dL (ref 100–199)
HDL: 34 mg/dL — ABNORMAL LOW (ref >39)
LDL Chol Calc (NIH): 60 mg/dL (ref 0–99)
LDL/HDL Ratio: 1.8 {ratio} (ref 0.0–3.6)
Triglycerides: 131 mg/dL (ref 0–149)
VLDL Cholesterol Cal: 23 mg/dL (ref 5–40)

## 2023-09-26 LAB — PSA: Prostate Specific Ag, Serum: 0.9 ng/mL (ref 0.0–4.0)

## 2023-09-27 ENCOUNTER — Encounter: Payer: Self-pay | Admitting: Family Medicine

## 2023-11-04 ENCOUNTER — Other Ambulatory Visit: Payer: Self-pay

## 2023-11-04 DIAGNOSIS — E782 Mixed hyperlipidemia: Secondary | ICD-10-CM

## 2023-11-04 MED ORDER — ATORVASTATIN CALCIUM 80 MG PO TABS: 80.0000 mg | ORAL_TABLET | Freq: Every day | ORAL | 3 refills | Status: AC

## 2023-11-15 ENCOUNTER — Ambulatory Visit
Admission: EM | Admit: 2023-11-15 | Discharge: 2023-11-15 | Disposition: A | Discharge status: Home / Self Care | Attending: Family Medicine | Admitting: Family Medicine

## 2023-11-15 DIAGNOSIS — J0100 Acute maxillary sinusitis, unspecified: Secondary | ICD-10-CM

## 2023-11-15 DIAGNOSIS — J340 Abscess, furuncle and carbuncle of nose: Secondary | ICD-10-CM | POA: Diagnosis not present

## 2023-11-15 MED ORDER — AMOXICILLIN-POT CLAVULANATE 875-125 MG PO TABS: ORAL_TABLET | ORAL | 0 refills | Status: DC

## 2023-11-15 NOTE — ED Triage Notes (Signed)
 Pt presents to [facility] with co sinus pain, congestion, runny nose, ha since 4/1. Pt has not taken any otc.

## 2023-11-15 NOTE — Discharge Instructions (Signed)
Take plain guaifenesin (1200mg extended release tabs such as Mucinex) twice daily, with plenty of water, for cough and congestion.  May add Pseudoephedrine (30mg, one or two every 4 to 6 hours) for sinus congestion.  Get adequate rest.   °May use Afrin nasal spray (or generic oxymetazoline) each morning for about 5 days and then discontinue.  Also recommend using saline nasal spray several times daily and saline nasal irrigation (AYR is a common brand).  Use Flonase nasal spray each morning after using Afrin nasal spray and saline nasal irrigation. °Try warm salt water gargles for sore throat.  °Stop all antihistamines for now, and other non-prescription cough/cold preparations. ° May take Delsym Cough Suppressant ("12 Hour Cough Relief") at bedtime for nighttime cough.  °

## 2023-11-15 NOTE — ED Provider Notes (Signed)
 Leonard Padilla CARE    CSN: [id number] Arrival date & time: 11/15/23  1032      History   Chief Complaint Chief Complaint  Patient presents with   Facial Pain   Nasal Congestion    HPI Leonard Padilla is a 78 y.o. male.   Patient complains of persistent sinus congestion for the past 10 days without URI symptoms.  He has now developed facial pressure and soreness at the tip of his nose.  He denies fevers, chills, and sweats.  The history is provided by the patient.    Past Medical History:  Diagnosis Date   Allergic conjunctivitis of left eye 10/16/2019   Aortic stenosis    moderate AS 12/01/15 echo (Dr. Lawana Pray)   Arthritis    Bilateral leg edema 09/24/2017   Chronic constipation 09/03/2021   CKD (chronic kidney disease) stage 3, GFR 30-59 ml/min (HCC) 08/04/2016   CKD stage G3a/A1, GFR 45-59 and albumin creatinine ratio <30 mg/g (HCC) 08/04/2016   Colon cancer screening declined 05/27/2018   Combined form of age-related cataract, both eyes 08/12/2018   Coronary artery disease    Diastolic dysfunction without heart failure 08/19/2017   Dyslipidemia, goal LDL below 70 03/18/2017   Dysthymia 03/18/2017   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/07/2017   Mild, repeat in 5 years (2023)   Family history of prostate cancer in father 03/02/2019   Former smoker, stopped smoking in distant past    Heart murmur    History of colon polyps    History of colonic polyps 03/02/2019   Hyperlipidemia    Hypertension goal BP (blood pressure) < 140/90 10/13/2015   Hypertensive retinopathy of both eyes 08/12/2018   Intraoperative floppy iris syndrome (IFIS) 03/02/2021   Irritability and anger 03/18/2017   Lower urinary tract symptoms (LUTS) 04/29/2017   Posterior vitreous detachment, left eye 08/12/2018   Postural dizziness with presyncope 08/27/2016   Psoriasis    S/P AVR 09/09/2017   Edwards Lifesciences 23 mm, model 3300TFX, serial #[id number]     SBE (subacute bacterial endocarditis)  prophylaxis candidate 10/06/2018   Sleep apnea    to pick cpap tomorrow 12/13/15   Status post carotid endarterectomy 12/15/2015   Right sided B/l plaques <50% velocity (09/2016)   Status post CVA 08/03/2016   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   Stroke (HCC) 09/2015   Symptomatic carotid artery stenosis 12/15/2015   Urinary retention due to benign prostatic hyperplasia 09/13/2017   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   Wears glasses     Patient Active Problem List   Diagnosis Date Noted   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   Well adult exam 09/20/2022   Non-recurrent acute suppurative otitis media of right ear without spontaneous rupture of tympanic membrane 04/03/2022   Actinic keratosis 12/14/2021   Chronic constipation 09/03/2021   Intraoperative floppy iris syndrome (IFIS) 03/02/2021   Aortic stenosis    Arthritis    Coronary artery disease    Heart murmur    History of colon polyps    Hyperlipidemia    Sleep apnea    Wears glasses    Allergic conjunctivitis of left eye 10/16/2019   History of colonic polyps 03/02/2019   Family history of prostate cancer in father 03/02/2019   SBE (subacute bacterial endocarditis) prophylaxis candidate 10/06/2018   Hypertensive retinopathy of both eyes 08/12/2018   Posterior vitreous detachment, left eye 08/12/2018   Colon cancer screening declined 05/27/2018   Status post  four vessel coronary artery bypass 11/08/2017   Bilateral leg edema 09/24/2017   Urinary retention due to benign prostatic hyperplasia 09/13/2017   S/P AVR 09/09/2017   Diastolic dysfunction without heart failure 08/19/2017   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/07/2017   Lower urinary tract symptoms (LUTS) 04/29/2017   Former smoker, stopped smoking in distant past    Dysthymia 03/18/2017   Irritability and anger 03/18/2017   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   Dyslipidemia, goal LDL below 70 03/18/2017    Postural dizziness with presyncope 08/27/2016   CKD (chronic kidney disease) stage 3, GFR 30-59 ml/min (HCC) 08/04/2016   CKD stage G3a/A1, GFR 45-59 and albumin creatinine ratio <30 mg/g (HCC) 08/04/2016   Status post CVA 08/03/2016   Status post carotid endarterectomy 12/15/2015   Symptomatic carotid artery stenosis 12/15/2015   Hypertension goal BP (blood pressure) < 140/90 10/13/2015   Stroke (HCC) 09/2015   Psoriasis 12/02/2014    Past Surgical History:  Procedure Laterality Date   AORTIC VALVE REPLACEMENT N/A 09/09/2017   Procedure: AORTIC VALVE REPLACEMENT (AVR) using a 23 Edwards Perimount Magna Ease Aortic Valve;  Surgeon: Norita Beauvais, MD;  Location: [facility] OR;  Service: Open Heart Surgery;  Laterality: N/A;   COLONOSCOPY W/ BIOPSIES AND POLYPECTOMY     CORONARY ARTERY BYPASS GRAFT N/A 09/09/2017   Procedure: CORONARY ARTERY BYPASS GRAFTING (CABG) x 4 using the left internal mammary artery and right greater saphenous vein harvested endoscopically.;  Surgeon: Norita Beauvais, MD;  Location[facility] OR;  Service: Open Heart Surgery;  Laterality: N/A;   ENDARTERECTOMY Right 12/15/2015   Procedure: RIGHT CAROTID ENDARTERECTOMY WITH PATCH ANGIOPLASTY;  Surgeon: Margherita Shell, MD;  Location: [facility] OR;  Service: Vascular;  Laterality: Right;   EYE SURGERY Bilateral    Right 03/02/2021 Left 03/09/2021 Dr Lissa Riding   [facility] ANGIOPLASTY Right 12/15/2015   Procedure: RIGHT CAROTID PATCH ANGIOPLASTY;  Surgeon: Margherita Shell, MD;  Location: [facility] OR;  Service: Vascular;  Laterality: Right;   RIGHT/LEFT HEART CATH AND CORONARY ANGIOGRAPHY N/A 08/23/2017   Procedure: RIGHT/LEFT HEART CATH AND CORONARY ANGIOGRAPHY;  Surgeon: Swaziland, Peter M, MD;  Location: [facility] INVASIVE CV LAB;  Service: Cardiovascular;  Laterality: N/A;   TEE WITHOUT CARDIOVERSION N/A 09/09/2017   Procedure: TRANSESOPHAGEAL ECHOCARDIOGRAM (TEE);  Surgeon: Norita Beauvais, MD;  Location: [facility] OR;  Service: Open Heart Surgery;   Laterality: N/A;   WISDOM TOOTH EXTRACTION         Home Medications    Prior to Admission medications   Medication Sig Start Date End Date Taking? Authorizing Provider  amoxicillin-clavulanate (AUGMENTIN) 875-125 MG tablet Take one tab PO Q12hr with food 11/15/23  Yes Klayten Jolliff, Lenis Quin, MD  alfuzosin (UROXATRAL) 10 MG 24 hr tablet TAKE 1 TABLET BY MOUTH EVERY DAY WITH BREAKFAST 09/04/23   Adela Holter, DO  aspirin EC 81 MG tablet Take 1 tablet (81 mg total) by mouth daily. 08/22/17   Revankar, Micael Adas, MD  atorvastatin (LIPITOR) 80 MG tablet Take 1 tablet (80 mg total) by mouth daily. 11/04/23   Adela Holter, DO  clobetasol ointment (TEMOVATE) 0.05 % Apply 1 Application topically 2 (two) times daily. 09/09/23   [provider]  fexofenadine-pseudoephedrine (ALLEGRA-D 24) 180-240 MG 24 hr tablet Take 1 tablet by mouth as needed (allergies).    [provider]  fluticasone (FLONASE) 50 MCG/ACT nasal spray Place 2 sprays into both nostrils daily. 09/20/22   Adela Holter, DO  MAGNESIUM PO Take by mouth.  [provider]  nitroGLYCERIN (NITROLINGUAL) 0.4 MG/SPRAY spray Place 1 spray under the tongue every 5 (five) minutes x 3 doses as needed for chest pain.    [provider]  sertraline (ZOLOFT) 50 MG tablet TAKE 1 TABLET(50 MG) BY MOUTH DAILY 09/20/22   Adela Holter, DO    Family History Family History  Problem Relation Age of Onset   Cancer Mother        ovarian cancer   Cancer Father        Prostate    Social History Social History   Tobacco Use   Smoking status: Former    Current packs/day: 0.00    Types: Cigarettes    Quit date: 08/07/1983    Years since quitting: 40.3   Smokeless tobacco: Never  Vaping Use   Vaping status: Never Used  Substance Use Topics   Alcohol use: Yes    Comment: 1 glass of wine daily    Drug use: No     Allergies   Viagra [sildenafil citrate] and Metoprolol   Review of Systems Review of Systems No  sore throat No cough No pleuritic pain No wheezing + nasal congestion + post-nasal drainage + sinus pain/pressure No itchy/red eyes No earache No hemoptysis No SOB No fever/chills No nausea No vomiting No abdominal pain No diarrhea No urinary symptoms No skin rash No fatigue No myalgias + headache   Physical Exam Triage Vital Signs ED Triage Vitals  Encounter Vitals Group     BP 11/15/23 1322 93/61     Systolic BP Percentile --      Diastolic BP Percentile --      Pulse Rate 11/15/23 1322 94     Resp 11/15/23 1322 16     Temp 11/15/23 1322 98 F (36.7 C)     Temp src --      SpO2 11/15/23 1322 98 %     Weight --      Height --      Head Circumference --      Peak Flow --      Pain Score 11/15/23 1320 3     Pain Loc --      Pain Education --      Exclude from Growth Chart --    No data found.  Updated Vital Signs BP 93/61   Pulse 94   Temp 98 F (36.7 C)   Resp 16   SpO2 98%   Visual Acuity Right Eye Distance:   Left Eye Distance:   Bilateral Distance:    Right Eye Near:   Left Eye Near:    Bilateral Near:     Physical Exam Nursing notes and Vital Signs reviewed. Appearance:  Patient appears stated age, and in no acute distress Eyes:  Pupils are equal, round, and reactive to light and accomodation.  Extraocular movement is intact.  Conjunctivae are not inflamed  Ears:  Canals normal.  Tympanic membranes normal.  Nose:  Mildly congested turbinates. Maxillary sinus tenderness is present. Mild tenderness and minimal erythema at nares Pharynx:  Normal Neck:  Supple. No adenopathy. Lungs:  Clear to auscultation.  Breath sounds are equal.  Moving air well. Heart:  Regular rate and rhythm without murmurs, rubs, or gallops.  Abdomen:  Nontender without masses or hepatosplenomegaly.  Bowel sounds are present.  No CVA or flank tenderness.  Extremities:  No edema.  Skin:  No rash present.   UC Treatments / Results  Labs (all labs ordered are listed,  but only abnormal results are displayed) Labs Reviewed - No data to display  EKG   Radiology No results found.  Procedures Procedures (including critical care time)  Medications Ordered in UC Medications - No data to display  Initial Impression / Assessment and Plan / UC Course  I have reviewed the triage vital signs and the nursing notes.  Pertinent labs & imaging results that were available during my care of the patient were reviewed by me and considered in my medical decision making (see chart for details).    Begin Augmentin 875 Q12hr for one week. Advised to apply warm compress to nose several times daily. Followup with Family Doctor if not improved in one week.   Final Clinical Impressions(s) / UC Diagnoses   Final diagnoses:  Acute maxillary sinusitis, recurrence not specified  Cellulitis of external nose     Discharge Instructions      Take plain guaifenesin (1200mg  extended release tabs such as Mucinex) twice daily, with plenty of water, for cough and congestion.  May add Pseudoephedrine (30mg , one or two every 4 to 6 hours) for sinus congestion.  Get adequate rest.   May use Afrin nasal spray (or generic oxymetazoline) each morning for about 5 days and then discontinue.  Also recommend using saline nasal spray several times daily and saline nasal irrigation (AYR is a common brand).  Use Flonase nasal spray each morning after using Afrin nasal spray and saline nasal irrigation. Try warm salt water gargles for sore throat.  Stop all antihistamines for now, and other non-prescription cough/cold preparations. May take Delsym Cough Suppressant ("12 Hour Cough Relief") at bedtime for nighttime cough.     ED Prescriptions     Medication Sig Dispense Auth. Provider   amoxicillin-clavulanate (AUGMENTIN) 875-125 MG tablet Take one tab PO Q12hr with food 14 tablet Leon Rajas, MD         Leon Rajas, MD 11/17/23 1110

## 2023-11-18 ENCOUNTER — Ambulatory Visit: Admitting: Family Medicine

## 2023-12-16 ENCOUNTER — Telehealth: Payer: Self-pay | Admitting: Family Medicine

## 2023-12-16 MED ORDER — SERTRALINE HCL 50 MG PO TABS: ORAL_TABLET | ORAL | 0 refills | Status: DC

## 2023-12-16 NOTE — Telephone Encounter (Signed)
 Copied from CRM [id number]. Topic: Clinical - Medication Refill >> Dec 16, 2023 12:08 PM Retta Caster wrote: Medication: sertraline  (ZOLOFT ) 50 MG tablet   Has the patient contacted their pharmacy? No (Agent: If no, request that the patient contact the pharmacy for the refill. If patient does not wish to contact the pharmacy document the reason why and proceed with request.) (Agent: If yes, when and what did the pharmacy advise?)  This is the patient's preferred pharmacy:    [id number] [place], [place] - [address] [place] [facility] [address] Phone: [phone] Fax: [phone]  Is this the correct pharmacy for this prescription? Yes If no, delete pharmacy and type the correct one.   Has the prescription been filled recently? Yes  Is the patient out of the medication? Yes  Has the patient been seen for an appointment in the last year OR does the patient have an upcoming appointment? Yes  Can we respond through MyChart? Yes  Agent: Please be advised that Rx refills may take up to 3 business days. We ask that you follow-up with your pharmacy.

## 2024-02-18 ENCOUNTER — Other Ambulatory Visit: Payer: Self-pay | Admitting: Family Medicine

## 2024-02-18 DIAGNOSIS — N401 Enlarged prostate with lower urinary tract symptoms: Secondary | ICD-10-CM

## 2024-03-13 ENCOUNTER — Other Ambulatory Visit: Payer: Self-pay | Admitting: Family Medicine

## 2024-03-24 ENCOUNTER — Ambulatory Visit (INDEPENDENT_AMBULATORY_CARE_PROVIDER_SITE_OTHER): Payer: Medicare HMO | Admitting: Family Medicine

## 2024-03-24 ENCOUNTER — Encounter: Payer: Self-pay | Admitting: Family Medicine

## 2024-03-24 VITALS — BP 138/73 | HR 60 | Ht 67.72 in | Wt 163.0 lb

## 2024-03-24 DIAGNOSIS — N183 Chronic kidney disease, stage 3 unspecified: Secondary | ICD-10-CM | POA: Diagnosis not present

## 2024-03-24 DIAGNOSIS — I10 Essential (primary) hypertension: Secondary | ICD-10-CM | POA: Diagnosis not present

## 2024-03-24 DIAGNOSIS — E782 Mixed hyperlipidemia: Secondary | ICD-10-CM | POA: Diagnosis not present

## 2024-03-24 DIAGNOSIS — M25511 Pain in right shoulder: Secondary | ICD-10-CM

## 2024-03-24 DIAGNOSIS — S46211A Strain of muscle, fascia and tendon of other parts of biceps, right arm, initial encounter: Secondary | ICD-10-CM

## 2024-03-24 DIAGNOSIS — Z8042 Family history of malignant neoplasm of prostate: Secondary | ICD-10-CM

## 2024-03-24 HISTORY — DX: Strain of muscle, fascia and tendon of other parts of biceps, right arm, initial encounter: S46.211A

## 2024-03-24 NOTE — Assessment & Plan Note (Signed)
 Update renal function.

## 2024-03-24 NOTE — Assessment & Plan Note (Signed)
-  Doing well with atorvastatin -Update lipids

## 2024-03-24 NOTE — Assessment & Plan Note (Signed)
 BP is well controlled on repeat BP.   Reports better numbers at home and cuff has been verified in clinic.  Low sodium diet and continued monitoring at home recommended.

## 2024-03-24 NOTE — Progress Notes (Signed)
 Leonard Padilla - 78 y.o. male MRN [id number]  Date of birth: 08-31-1945  Subjective Chief Complaint  Patient presents with   Hypertension    HPI:  Leonard Padilla is a 78 y.o. male here today for follow up visit.   He reports that he is doing ok.  He is having some R bicep pain.  This started about 1 year ago.  Thought he just strained it but it really has not gotten better.  He does not recall any injury to this area.  He does feel muscle weakness.  He does have some pain in the shoulder as well.      BP is a little elevated today on initial check.  He denies symptoms including chest pain, shortness of breath, palpitations, headache or vision changes.    He continues on  atorvastatin  for management of HLD, CAD and history of CVA.  Tolerating this well at current strength.    Alfuzosin  remains effective for LUT's.  There is a family history of prostate cancer and he wants to keep a close eye on his PSA level.   History of dysthymia and remains stable with sertraline .    ROS:  A comprehensive ROS was completed and negative except as noted per HPI   Allergies  Allergen Reactions   Viagra [Sildenafil Citrate] Other (See Comments)    COLOR DISCRIMINATION [DOSE RELATED] blue sensation   Metoprolol  Other (See Comments)    Orthostasis, near syncope    Past Medical History:  Diagnosis Date   Allergic conjunctivitis of left eye 10/16/2019   Aortic stenosis    moderate AS 12/01/15 echo (Dr. Inocencio)   Arthritis    Bilateral leg edema 09/24/2017   Chronic constipation 09/03/2021   CKD (chronic kidney disease) stage 3, GFR 30-59 ml/min (HCC) 08/04/2016   CKD stage G3a/A1, GFR 45-59 and albumin  creatinine ratio <30 mg/g (HCC) 08/04/2016   Colon cancer screening declined 05/27/2018   Combined form of age-related cataract, both eyes 08/12/2018   Coronary artery disease    Diastolic dysfunction without heart failure 08/19/2017   Dyslipidemia, goal LDL below 70 03/18/2017   Dysthymia  03/18/2017   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/07/2017   Mild, repeat in 5 years (2023)   Family history of prostate cancer in father 03/02/2019   Former smoker, stopped smoking in distant past    Heart murmur    History of colon polyps    History of colonic polyps 03/02/2019   Hyperlipidemia    Hypertension goal BP (blood pressure) < 140/90 10/13/2015   Hypertensive retinopathy of both eyes 08/12/2018   Intraoperative floppy iris syndrome (IFIS) 03/02/2021   Irritability and anger 03/18/2017   Lower urinary tract symptoms (LUTS) 04/29/2017   Posterior vitreous detachment, left eye 08/12/2018   Postural dizziness with presyncope 08/27/2016   Psoriasis    S/P AVR 09/09/2017   Edwards Lifesciences 23 mm, model 3300TFX, serial #[id number]     SBE (subacute bacterial endocarditis) prophylaxis candidate 10/06/2018   Sleep apnea    to pick cpap tomorrow 12/13/15   Status post carotid endarterectomy 12/15/2015   Right sided B/l plaques <50% velocity (09/2016)   Status post CVA 08/03/2016   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   Stroke (HCC) 09/2015   Symptomatic carotid artery stenosis 12/15/2015   Urinary retention due to benign prostatic hyperplasia 09/13/2017   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   Wears glasses     Past Surgical History:  Procedure Laterality Date  AORTIC VALVE REPLACEMENT N/A 09/09/2017   Procedure: AORTIC VALVE REPLACEMENT (AVR) using a 23 Edwards Perimount Magna Ease Aortic Valve;  Surgeon: Army Dallas NOVAK, MD;  Location: [facility] OR;  Service: Open Heart Surgery;  Laterality: N/A;   COLONOSCOPY W/ BIOPSIES AND POLYPECTOMY     CORONARY ARTERY BYPASS GRAFT N/A 09/09/2017   Procedure: CORONARY ARTERY BYPASS GRAFTING (CABG) x 4 using the left internal mammary artery and right greater saphenous vein harvested endoscopically.;  Surgeon: Army Dallas NOVAK, MD;  Location: [facility] OR;  Service: Open Heart Surgery;  Laterality: N/A;   ENDARTERECTOMY  Right 12/15/2015   Procedure: RIGHT CAROTID ENDARTERECTOMY WITH PATCH ANGIOPLASTY;  Surgeon: Gaile LELON New, MD;  Location: [facility] OR;  Service: Vascular;  Laterality: Right;   EYE SURGERY Bilateral    Right 03/02/2021 Left 03/09/2021 Dr Gennie   [facility] ANGIOPLASTY Right 12/15/2015   Procedure: RIGHT CAROTID PATCH ANGIOPLASTY;  Surgeon: Gaile LELON New, MD;  Location: [facility] OR;  Service: Vascular;  Laterality: Right;   RIGHT/LEFT HEART CATH AND CORONARY ANGIOGRAPHY N/A 08/23/2017   Procedure: RIGHT/LEFT HEART CATH AND CORONARY ANGIOGRAPHY;  Surgeon: Swaziland, Peter M, MD;  Location: [facility] INVASIVE CV LAB;  Service: Cardiovascular;  Laterality: N/A;   TEE WITHOUT CARDIOVERSION N/A 09/09/2017   Procedure: TRANSESOPHAGEAL ECHOCARDIOGRAM (TEE);  Surgeon: Army Dallas NOVAK, MD;  Location: [facility] OR;  Service: Open Heart Surgery;  Laterality: N/A;   WISDOM TOOTH EXTRACTION      Social History   Socioeconomic History   Marital status: Married    Spouse name: [name]   Number of children: 2   Years of education: 13   Highest education level: Some college, no degree  Occupational History   Occupation: Retired    Comment: retired Clinical biochemist, [employer]  Tobacco Use   Smoking status: Former    Current packs/day: 0.00    Types: Cigarettes    Quit date: 08/07/1983    Years since quitting: 40.6   Smokeless tobacco: Never  Vaping Use   Vaping status: Never Used  Substance and Sexual Activity   Alcohol use: Yes    Comment: 1 glass of wine daily    Drug use: No   Sexual activity: Not Currently  Other Topics Concern   Not on file  Social History Narrative   Lives with wife   Caffeine use- coffee 3-4 cups daily   Social Drivers of Health   Financial Resource Strain: Low Risk  (03/20/2024)   Overall Financial Resource Strain (CARDIA)    Difficulty of Paying Living Expenses: Not hard at all  Food Insecurity: No Food Insecurity (03/20/2024)   Hunger Vital Sign    Worried About Running Out of Food in the Last  Year: Never true    Ran Out of Food in the Last Year: Never true  Transportation Needs: No Transportation Needs (03/20/2024)   PRAPARE - Administrator, Civil Service (Medical): No    Lack of Transportation (Non-Medical): No  Physical Activity: Insufficiently Active (03/20/2024)   Exercise Vital Sign    Days of Exercise per Week: 3 days    Minutes of Exercise per Session: 20 min  Stress: No Stress Concern Present (03/20/2024)   [employer] of Occupational Health - Occupational Stress Questionnaire    Feeling of Stress: Not at all  Social Connections: Unknown (03/20/2024)   Social Connection and Isolation Panel    Frequency of Communication with Friends and Family: Once a week    Frequency of Social Gatherings with Friends  and Family: Once a week    Attends Religious Services: Not on file    Active Member of Clubs or Organizations: No    Attends Banker Meetings: Not on file    Marital Status: Married    Family History  Problem Relation Age of Onset   Cancer Mother        ovarian cancer   Cancer Father        Prostate    Health Maintenance  Topic Date Due   Medicare Annual Wellness (AWV)  03/03/2021   INFLUENZA VACCINE  03/06/2024   COVID-19 Vaccine (7 - 2024-25 season) 09/19/2024 (Originally 04/07/2023)   Pneumococcal Vaccine: 50+ Years  Completed   Hepatitis C Screening  Completed   Zoster Vaccines- Shingrix  Completed   HPV VACCINES  Aged Out   Meningococcal B Vaccine  Aged Out   DTaP/Tdap/Td  Discontinued     ----------------------------------------------------------------------------------------------------------------------------------------------------------------------------------------------------------------- Physical Exam BP 138/73   Pulse 60   Ht 5' 7.72 (1.72 m)   Wt 163 lb (73.9 kg)   SpO2 99%   BMI 24.99 kg/m   Physical Exam Constitutional:      Appearance: Normal appearance.  Eyes:     General: No scleral  icterus. Cardiovascular:     Rate and Rhythm: Normal rate and regular rhythm.  Pulmonary:     Effort: Pulmonary effort is normal.     Breath sounds: Normal breath sounds.  Neurological:     Mental Status: He is alert.     ------------------------------------------------------------------------------------------------------------------------------------------------------------------------------------------------------------------- Assessment and Plan  Strain of right biceps He is having bicep pain along with R shoulder pain.  Suspect possible labral pathology.  Referral to physical therapy initially.  If not improving will plan to obtain MRI arthrogram of the r shoulder  Hyperlipidemia Doing well with atorvastatin  .  Update lipids.   CKD (chronic kidney disease) stage 3, GFR 30-59 ml/min (HCC) Update renal function.   Hypertension goal BP (blood pressure) < 140/90 BP is well controlled on repeat BP.   Reports better numbers at home and cuff has been verified in clinic.  Low sodium diet and continued monitoring at home recommended.   Family history of prostate cancer in father Will continue to follow PSA levels.    No orders of the defined types were placed in this encounter.   Return in about 6 months (around 09/24/2024) for HLD.

## 2024-03-24 NOTE — Assessment & Plan Note (Signed)
 He is having bicep pain along with R shoulder pain.  Suspect possible labral pathology.  Referral to physical therapy initially.  If not improving will plan to obtain MRI arthrogram of the r shoulder

## 2024-03-24 NOTE — Assessment & Plan Note (Signed)
 Will continue to follow PSA levels.

## 2024-03-26 LAB — CMP14+EGFR
ALT: 19 IU/L (ref 0–44)
AST: 22 IU/L (ref 0–40)
Albumin: 4.4 g/dL (ref 3.8–4.8)
Alkaline Phosphatase: 92 IU/L (ref 44–121)
BUN/Creatinine Ratio: 24 (ref 10–24)
BUN: 35 mg/dL — ABNORMAL HIGH (ref 8–27)
Bilirubin Total: 0.5 mg/dL (ref 0.0–1.2)
CO2: 24 mmol/L (ref 20–29)
Calcium: 9.8 mg/dL (ref 8.6–10.2)
Chloride: 106 mmol/L (ref 96–106)
Creatinine, Ser: 1.45 mg/dL — ABNORMAL HIGH (ref 0.76–1.27)
Globulin, Total: 2.2 g/dL (ref 1.5–4.5)
Glucose: 87 mg/dL (ref 70–99)
Potassium: 5.4 mmol/L — ABNORMAL HIGH (ref 3.5–5.2)
Sodium: 143 mmol/L (ref 134–144)
Total Protein: 6.6 g/dL (ref 6.0–8.5)
eGFR: 49 mL/min/1.73 — ABNORMAL LOW (ref >59)

## 2024-03-26 LAB — CBC WITH DIFFERENTIAL/PLATELET
Basophils Absolute: 0 x10E3/uL (ref 0.0–0.2)
Basos: 1 %
EOS (ABSOLUTE): 0.4 x10E3/uL (ref 0.0–0.4)
Eos: 7 %
Hematocrit: 41.2 % (ref 37.5–51.0)
Hemoglobin: 13.5 g/dL (ref 13.0–17.7)
Immature Grans (Abs): 0 x10E3/uL (ref 0.0–0.1)
Immature Granulocytes: 0 %
Lymphocytes Absolute: 1.5 x10E3/uL (ref 0.7–3.1)
Lymphs: 28 %
MCH: 31.5 pg (ref 26.6–33.0)
MCHC: 32.8 g/dL (ref 31.5–35.7)
MCV: 96 fL (ref 79–97)
Monocytes Absolute: 0.4 x10E3/uL (ref 0.1–0.9)
Monocytes: 7 %
Neutrophils Absolute: 3.1 x10E3/uL (ref 1.4–7.0)
Neutrophils: 57 %
Platelets: 184 x10E3/uL (ref 150–450)
RBC: 4.28 x10E6/uL (ref 4.14–5.80)
RDW: 12.4 % (ref 11.6–15.4)
WBC: 5.4 x10E3/uL (ref 3.4–10.8)

## 2024-03-26 LAB — LIPID PANEL WITH LDL/HDL RATIO
Cholesterol, Total: 142 mg/dL (ref 100–199)
HDL: 58 mg/dL (ref >39)
LDL Chol Calc (NIH): 71 mg/dL (ref 0–99)
LDL/HDL Ratio: 1.2 ratio (ref 0.0–3.6)
Triglycerides: 63 mg/dL (ref 0–149)
VLDL Cholesterol Cal: 13 mg/dL (ref 5–40)

## 2024-03-26 LAB — PSA: Prostate Specific Ag, Serum: 0.9 ng/mL (ref 0.0–4.0)

## 2024-03-31 ENCOUNTER — Ambulatory Visit: Attending: Family Medicine

## 2024-03-31 ENCOUNTER — Other Ambulatory Visit: Payer: Self-pay

## 2024-03-31 DIAGNOSIS — G8929 Other chronic pain: Secondary | ICD-10-CM | POA: Insufficient documentation

## 2024-03-31 DIAGNOSIS — M25511 Pain in right shoulder: Secondary | ICD-10-CM | POA: Insufficient documentation

## 2024-03-31 DIAGNOSIS — R293 Abnormal posture: Secondary | ICD-10-CM | POA: Diagnosis present

## 2024-03-31 DIAGNOSIS — S46211A Strain of muscle, fascia and tendon of other parts of biceps, right arm, initial encounter: Secondary | ICD-10-CM | POA: Diagnosis not present

## 2024-03-31 DIAGNOSIS — M6281 Muscle weakness (generalized): Secondary | ICD-10-CM | POA: Insufficient documentation

## 2024-03-31 NOTE — Therapy (Signed)
 OUTPATIENT PHYSICAL THERAPY UPPER EXTREMITY EVALUATION   Patient Name: AZIZ SLAPE MRN: [id number] DOB:1946-05-18, 78 y.o., male Today's Date: 04/01/2024  END OF SESSION:  PT End of Session - 03/31/24 1400     Visit Number 1    Number of Visits 13    Date for PT Re-Evaluation 05/16/24    Authorization Type aetna MCR    Progress Note Due on Visit 10    PT Start Time 1400    PT Stop Time 1445    PT Time Calculation (min) 45 min    Activity Tolerance Patient tolerated treatment well    Behavior During Therapy [facility] for tasks assessed/performed          Past Medical History:  Diagnosis Date   Allergic conjunctivitis of left eye 10/16/2019   Aortic stenosis    moderate AS 12/01/15 echo (Dr. Inocencio)   Arthritis    Bilateral leg edema 09/24/2017   Chronic constipation 09/03/2021   CKD (chronic kidney disease) stage 3, GFR 30-59 ml/min (HCC) 08/04/2016   CKD stage G3a/A1, GFR 45-59 and albumin  creatinine ratio <30 mg/g (HCC) 08/04/2016   Colon cancer screening declined 05/27/2018   Combined form of age-related cataract, both eyes 08/12/2018   Coronary artery disease    Diastolic dysfunction without heart failure 08/19/2017   Dyslipidemia, goal LDL below 70 03/18/2017   Dysthymia 03/18/2017   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/07/2017   Mild, repeat in 5 years (2023)   Family history of prostate cancer in father 03/02/2019   Former smoker, stopped smoking in distant past    Heart murmur    History of colon polyps    History of colonic polyps 03/02/2019   Hyperlipidemia    Hypertension goal BP (blood pressure) < 140/90 10/13/2015   Hypertensive retinopathy of both eyes 08/12/2018   Intraoperative floppy iris syndrome (IFIS) 03/02/2021   Irritability and anger 03/18/2017   Lower urinary tract symptoms (LUTS) 04/29/2017   Posterior vitreous detachment, left eye 08/12/2018   Postural dizziness with presyncope 08/27/2016   Psoriasis    S/P AVR 09/09/2017   Edwards  Lifesciences 23 mm, model 3300TFX, serial #[id number]     SBE (subacute bacterial endocarditis) prophylaxis candidate 10/06/2018   Sleep apnea    to pick cpap tomorrow 12/13/15   Status post carotid endarterectomy 12/15/2015   Right sided B/l plaques <50% velocity (09/2016)   Status post CVA 08/03/2016   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   Stroke (HCC) 09/2015   Symptomatic carotid artery stenosis 12/15/2015   Urinary retention due to benign prostatic hyperplasia 09/13/2017   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   Wears glasses    Past Surgical History:  Procedure Laterality Date   AORTIC VALVE REPLACEMENT N/A 09/09/2017   Procedure: AORTIC VALVE REPLACEMENT (AVR) using a 23 Edwards Perimount Magna Ease Aortic Valve;  Surgeon: Army Dallas NOVAK, MD;  Location: [facility] OR;  Service: Open Heart Surgery;  Laterality: N/A;   COLONOSCOPY W/ BIOPSIES AND POLYPECTOMY     CORONARY ARTERY BYPASS GRAFT N/A 09/09/2017   Procedure: CORONARY ARTERY BYPASS GRAFTING (CABG) x 4 using the left internal mammary artery and right greater saphenous vein harvested endoscopically.;  Surgeon: Army Dallas NOVAK, MD;  Location: [facility] OR;  Service: Open Heart Surgery;  Laterality: N/A;   ENDARTERECTOMY Right 12/15/2015   Procedure: RIGHT CAROTID ENDARTERECTOMY WITH PATCH ANGIOPLASTY;  Surgeon: Gaile LELON New, MD;  Location: [facility] OR;  Service: Vascular;  Laterality: Right;   EYE SURGERY Bilateral  Right 03/02/2021 Left 03/09/2021 Dr Gennie   [facility] ANGIOPLASTY Right 12/15/2015   Procedure: RIGHT CAROTID PATCH ANGIOPLASTY;  Surgeon: Gaile LELON New, MD;  Location: [facility] OR;  Service: Vascular;  Laterality: Right;   RIGHT/LEFT HEART CATH AND CORONARY ANGIOGRAPHY N/A 08/23/2017   Procedure: RIGHT/LEFT HEART CATH AND CORONARY ANGIOGRAPHY;  Surgeon: [place], Peter M, MD;  Location: [facility] INVASIVE CV LAB;  Service: Cardiovascular;  Laterality: N/A;   TEE WITHOUT CARDIOVERSION N/A 09/09/2017   Procedure:  TRANSESOPHAGEAL ECHOCARDIOGRAM (TEE);  Surgeon: Army Dallas NOVAK, MD;  Location: [facility] OR;  Service: Open Heart Surgery;  Laterality: N/A;   WISDOM TOOTH EXTRACTION     Patient Active Problem List   Diagnosis Date Noted   Strain of right biceps 03/24/2024   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   Well adult exam 09/20/2022   Non-recurrent acute suppurative otitis media of right ear without spontaneous rupture of tympanic membrane 04/03/2022   Actinic keratosis 12/14/2021   Chronic constipation 09/03/2021   Intraoperative floppy iris syndrome (IFIS) 03/02/2021   Aortic stenosis    Arthritis    Coronary artery disease    Heart murmur    History of colon polyps    Hyperlipidemia    Sleep apnea    Wears glasses    Allergic conjunctivitis of left eye 10/16/2019   History of colonic polyps 03/02/2019   Family history of prostate cancer in father 03/02/2019   SBE (subacute bacterial endocarditis) prophylaxis candidate 10/06/2018   Hypertensive retinopathy of both eyes 08/12/2018   Posterior vitreous detachment, left eye 08/12/2018   Colon cancer screening declined 05/27/2018   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   Bilateral leg edema 09/24/2017   Urinary retention due to benign prostatic hyperplasia 09/13/2017   S/P AVR 09/09/2017   Diastolic dysfunction without heart failure 08/19/2017   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/07/2017   Lower urinary tract symptoms (LUTS) 04/29/2017   Former smoker, stopped smoking in distant past    Dysthymia 03/18/2017   Irritability and anger 03/18/2017   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   Dyslipidemia, goal LDL below 70 03/18/2017   Postural dizziness with presyncope 08/27/2016   CKD (chronic kidney disease) stage 3, GFR 30-59 ml/min (HCC) 08/04/2016   CKD stage G3a/A1, GFR 45-59 and albumin  creatinine ratio <30 mg/g (HCC) 08/04/2016   Status post CVA 08/03/2016   Status post carotid endarterectomy  12/15/2015   Symptomatic carotid artery stenosis 12/15/2015   Hypertension goal BP (blood pressure) < 140/90 10/13/2015   Stroke (HCC) 09/2015   Psoriasis 12/02/2014    PCP: Alvia Bring, DO   REFERRING PROVIDER: Alvia Bring, DO   REFERRING DIAG:  [phone] (ICD-10-CM) - Strain of right biceps, initial encounter  M25.511 (ICD-10-CM) - Acute pain of right shoulder    THERAPY DIAG:  Chronic right shoulder pain  Muscle weakness (generalized)  Abnormal posture  Rationale for Evaluation and Treatment: Rehabilitation  ONSET DATE: chronic   SUBJECTIVE:  SUBJECTIVE STATEMENT: Patient reports for at least a year he has had pain in the Rt upper arm/shoulder. He does not recall a MOI. The pain has remained relatively unchanged since initial onset.  Depending upon the way he moves it arm the pain can be more localized to the distal bicep or the posterolateral upper arm/shoulder. What brought him into PT was difficulty with lifting. No numbness/tingling. Patient reports previous injury to his rotator cuff 5-10 years stating that for awhile he was just unable to lift the right arm on it's own, but did not receive any treatment.  Hand dominance: Right  PERTINENT HISTORY: CVA See PSH above   PAIN:  Are you having pain? Yes: NPRS scale: 5 Pain location: Rt upper arm Pain description: ache Aggravating factors: lifting, maintaining flexed elbow Relieving factors: repositioning the arm (does not have one position of comfort)   PRECAUTIONS: None  RED FLAGS: None   WEIGHT BEARING RESTRICTIONS: No  FALLS:  Has patient fallen in last 6 months? No  LIVING ENVIRONMENT: Lives with: lives with their spouse Lives in: House/apartment Stairs: Yes: Internal: flight steps; on left going up Has following  equipment at home: None  OCCUPATION: Retired   PLOF: Independent  PATIENT GOALS: no pain.   NEXT MD VISIT: 09/24/24  OBJECTIVE:  Note: Objective measures were completed at Evaluation unless otherwise noted.  DIAGNOSTIC FINDINGS:  None   PATIENT SURVEYS :  Quick DASH: 22.7% disability   COGNITION: Overall cognitive status: Within functional limits for tasks assessed     SENSATION: Not tested  POSTURE: Forward head, rounded shoulders, kyphosis   UPPER EXTREMITY ROM:   Active ROM Right eval Left eval  Shoulder flexion 155 pain    Shoulder extension    Shoulder abduction Full painful arc   Shoulder adduction    Shoulder internal rotation 80 pain   Shoulder external rotation 70 pain    Elbow flexion    Elbow extension    Wrist flexion    Wrist extension    Wrist ulnar deviation    Wrist radial deviation    Wrist pronation    Wrist supination    (Blank rows = not tested)  UPPER EXTREMITY MMT:  MMT Right eval Left eval  Shoulder flexion 4 pain   Shoulder scaption 4- pain (most significant pain)    Shoulder abduction 5 pain   Shoulder adduction    Shoulder internal rotation 5 pain   Shoulder external rotation 4 pain    Middle trapezius    Lower trapezius    Elbow flexion    Elbow extension    Wrist flexion 5   Wrist extension    Wrist ulnar deviation    Wrist radial deviation    Wrist pronation    Wrist supination    Grip strength (lbs)    (Blank rows = not tested)  SHOULDER SPECIAL TESTS: Impingement tests: Neer impingement test: positive , Hawkins/Kennedy impingement test: positive , and Painful arc test: positive  Rotator cuff assessment: Empty can test: positive  Biceps assessment: Yergason's test: negative and Speed's test: positive   JOINT MOBILITY TESTING:  Not assessed  PALPATION:  TTP greater tubercle  [facility]  Adult PT Treatment:                                                DATE: 03/31/24 Therapeutic Exercise: Demonstrated,performed, and issued initial HEP.  Self Care: Ice for pain control Posture education and benefits of posture shirt Anatomy of current condition     PATIENT EDUCATION: Education details: see treatment: POC Person educated: Patient Education method: Explanation, Demonstration, Tactile cues, Verbal cues, and Handouts Education comprehension: verbalized understanding, returned demonstration, verbal cues required, tactile cues required, and needs further education  HOME EXERCISE PROGRAM: Access Code: [email or web address] URL: [email or web address] Date: 03/31/2024 Prepared by: Lucie Meeter  Exercises - Seated Scapular Retraction  - 2 x daily - 7 x weekly - 2 sets - 10 reps - Doorway Pec Stretch at 60 Elevation  - 2 x daily - 7 x weekly - 3 sets - 30 sec  hold - Shoulder Internal Rotation Reactive Isometrics  - 1 x daily - 7 x weekly - 2 sets - 10 reps - Shoulder External Rotation Reactive Isometrics  - 1 x daily - 7 x weekly - 2 sets - 10 reps  ASSESSMENT:  CLINICAL IMPRESSION: Patient is a 78 y.o. male who was seen today for physical therapy evaluation and treatment for chronic Rt shoulder/upper arm pain that has been ongoing for a year of insidious onset. Upon assessment he is noted to have painful Rt shoulder AROM in all planes with limitation noted in flexion and ER. He has weakness and pain with Rt shoulder MMT with most notable weakness and pain with scaption MMT. He has postural abnormalities: increased kyphosis, forward head, and rounded shoulders. He will benefit from skilled PT to address the above stated deficits in order to optimize his function and assist in overall pain reduction.    OBJECTIVE IMPAIRMENTS: decreased activity tolerance, decreased knowledge of condition, decreased ROM, decreased strength, impaired flexibility, impaired UE functional use,  postural dysfunction, and pain.   ACTIVITY LIMITATIONS: carrying, lifting, sleeping, and reach over head  PARTICIPATION LIMITATIONS: cleaning, community activity, and yard work  PERSONAL FACTORS: Age, Fitness, Time since onset of injury/illness/exacerbation, and 3+ comorbidities: see PMH above are also affecting patient's functional outcome.   REHAB POTENTIAL: Good  CLINICAL DECISION MAKING: Stable/uncomplicated  EVALUATION COMPLEXITY: Low  GOALS: Goals reviewed with patient? Yes  SHORT TERM GOALS: Target date: 04/21/2024    Patient will be independent and compliant with initial HEP.   Baseline:  initial HEP issued  Goal status: INITIAL  2.  Patient will demonstrate knowledge and application of appropriate sitting posture to reduce stress on his shoulder.  Baseline: see above Goal status: INITIAL  3.  Patient will report pain at worst rated as </= 3/10 to signify improvement in current condition.  Baseline: 5 Goal status: INITIAL   LONG TERM GOALS: Target date: 05/16/24  Patient will score </= 13 % disability on the QuickDASH (MCID is 8-15.9) to signify clinically meaningful improvement in functional abilities.   Baseline: see above Goal status: INITIAL  2.  Patient will demonstrate at least 4+/5 Rt rotator cuff strength to improve shoulder stability.  Baseline: see above  Goal status: INITIAL  3.  Patient will demonstrate 5/5 Rt shoulder flexion strength to improve ability to lift items.  Baseline: see above  Goal status: INITIAL  4.  Patient will be independent with advanced  HEP to progress/maintain current level of function.  Baseline: see above  Goal status: INITIAL  PLAN: PT FREQUENCY: 1-2x/week  PT DURATION: 6 weeks  PLANNED INTERVENTIONS: 02835- PT Re-evaluation, 97750- Physical Performance Testing, 97110-Therapeutic exercises, 97530- Therapeutic activity, [id number]- Neuromuscular re-education, 97535- Self Care, [id number]- Manual therapy, [id number]- Electrical  stimulation (unattended), [id number]- Electrical stimulation (manual), [id number]- Ionotophoresis 4mg /ml Dexamethasone, 79439 (1-2 muscles), 20561 (3+ muscles)- Dry Needling, Cryotherapy, and Moist heat  PLAN FOR NEXT SESSION: posture education, periscapular strengthening, progress pain free rotator cuff strengthening.    Md Smola, PT, DPT, ATC 04/01/24 12:38 PM

## 2024-04-03 ENCOUNTER — Ambulatory Visit: Payer: Self-pay | Admitting: Family Medicine

## 2024-04-08 ENCOUNTER — Ambulatory Visit: Attending: Family Medicine

## 2024-04-08 DIAGNOSIS — M6281 Muscle weakness (generalized): Secondary | ICD-10-CM | POA: Diagnosis present

## 2024-04-08 DIAGNOSIS — G8929 Other chronic pain: Secondary | ICD-10-CM | POA: Insufficient documentation

## 2024-04-08 DIAGNOSIS — M25511 Pain in right shoulder: Secondary | ICD-10-CM | POA: Insufficient documentation

## 2024-04-08 DIAGNOSIS — R293 Abnormal posture: Secondary | ICD-10-CM | POA: Diagnosis present

## 2024-04-08 NOTE — Therapy (Signed)
 OUTPATIENT PHYSICAL THERAPY UPPER EXTREMITY TREATMENT   Patient Name: Leonard Padilla MRN: [id number] DOB:March 17, 1946, 78 y.o., male Today's Date: 04/08/2024  END OF SESSION:  PT End of Session - 04/08/24 1017     Visit Number 2    Number of Visits 13    Date for PT Re-Evaluation 05/16/24    Authorization Type aetna MCR    Progress Note Due on Visit 10    PT Start Time 1017    PT Stop Time 1055    PT Time Calculation (min) 38 min    Activity Tolerance Patient tolerated treatment well    Behavior During Therapy [facility] for tasks assessed/performed           Past Medical History:  Diagnosis Date   Allergic conjunctivitis of left eye 10/16/2019   Aortic stenosis    moderate AS 12/01/15 echo (Dr. Inocencio)   Arthritis    Bilateral leg edema 09/24/2017   Chronic constipation 09/03/2021   CKD (chronic kidney disease) stage 3, GFR 30-59 ml/min (HCC) 08/04/2016   CKD stage G3a/A1, GFR 45-59 and albumin  creatinine ratio <30 mg/g (HCC) 08/04/2016   Colon cancer screening declined 05/27/2018   Combined form of age-related cataract, both eyes 08/12/2018   Coronary artery disease    Diastolic dysfunction without heart failure 08/19/2017   Dyslipidemia, goal LDL below 70 03/18/2017   Dysthymia 03/18/2017   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/07/2017   Mild, repeat in 5 years (2023)   Family history of prostate cancer in father 03/02/2019   Former smoker, stopped smoking in distant past    Heart murmur    History of colon polyps    History of colonic polyps 03/02/2019   Hyperlipidemia    Hypertension goal BP (blood pressure) < 140/90 10/13/2015   Hypertensive retinopathy of both eyes 08/12/2018   Intraoperative floppy iris syndrome (IFIS) 03/02/2021   Irritability and anger 03/18/2017   Lower urinary tract symptoms (LUTS) 04/29/2017   Posterior vitreous detachment, left eye 08/12/2018   Postural dizziness with presyncope 08/27/2016   Psoriasis    S/P AVR 09/09/2017   Edwards  Lifesciences 23 mm, model 3300TFX, serial #[id number]     SBE (subacute bacterial endocarditis) prophylaxis candidate 10/06/2018   Sleep apnea    to pick cpap tomorrow 12/13/15   Status post carotid endarterectomy 12/15/2015   Right sided B/l plaques <50% velocity (09/2016)   Status post CVA 08/03/2016   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   Stroke (HCC) 09/2015   Symptomatic carotid artery stenosis 12/15/2015   Urinary retention due to benign prostatic hyperplasia 09/13/2017   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   Wears glasses    Past Surgical History:  Procedure Laterality Date   AORTIC VALVE REPLACEMENT N/A 09/09/2017   Procedure: AORTIC VALVE REPLACEMENT (AVR) using a 23 Edwards Perimount Magna Ease Aortic Valve;  Surgeon: Army Dallas NOVAK, MD;  Location: [facility] OR;  Service: Open Heart Surgery;  Laterality: N/A;   COLONOSCOPY W/ BIOPSIES AND POLYPECTOMY     CORONARY ARTERY BYPASS GRAFT N/A 09/09/2017   Procedure: CORONARY ARTERY BYPASS GRAFTING (CABG) x 4 using the left internal mammary artery and right greater saphenous vein harvested endoscopically.;  Surgeon: Army Dallas NOVAK, MD;  Location: [facility] OR;  Service: Open Heart Surgery;  Laterality: N/A;   ENDARTERECTOMY Right 12/15/2015   Procedure: RIGHT CAROTID ENDARTERECTOMY WITH PATCH ANGIOPLASTY;  Surgeon: Gaile LELON New, MD;  Location: [facility] OR;  Service: Vascular;  Laterality: Right;   EYE SURGERY  Bilateral    Right 03/02/2021 Left 03/09/2021 Dr Gennie   [facility] ANGIOPLASTY Right 12/15/2015   Procedure: RIGHT CAROTID PATCH ANGIOPLASTY;  Surgeon: Gaile LELON New, MD;  Location: [facility] OR;  Service: Vascular;  Laterality: Right;   RIGHT/LEFT HEART CATH AND CORONARY ANGIOGRAPHY N/A 08/23/2017   Procedure: RIGHT/LEFT HEART CATH AND CORONARY ANGIOGRAPHY;  Surgeon: [place], Peter M, MD;  Location: [facility] INVASIVE CV LAB;  Service: Cardiovascular;  Laterality: N/A;   TEE WITHOUT CARDIOVERSION N/A 09/09/2017   Procedure:  TRANSESOPHAGEAL ECHOCARDIOGRAM (TEE);  Surgeon: Army Dallas NOVAK, MD;  Location: [facility] OR;  Service: Open Heart Surgery;  Laterality: N/A;   WISDOM TOOTH EXTRACTION     Patient Active Problem List   Diagnosis Date Noted   Strain of right biceps 03/24/2024   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   Well adult exam 09/20/2022   Non-recurrent acute suppurative otitis media of right ear without spontaneous rupture of tympanic membrane 04/03/2022   Actinic keratosis 12/14/2021   Chronic constipation 09/03/2021   Intraoperative floppy iris syndrome (IFIS) 03/02/2021   Aortic stenosis    Arthritis    Coronary artery disease    Heart murmur    History of colon polyps    Hyperlipidemia    Sleep apnea    Wears glasses    Allergic conjunctivitis of left eye 10/16/2019   History of colonic polyps 03/02/2019   Family history of prostate cancer in father 03/02/2019   SBE (subacute bacterial endocarditis) prophylaxis candidate 10/06/2018   Hypertensive retinopathy of both eyes 08/12/2018   Posterior vitreous detachment, left eye 08/12/2018   Colon cancer screening declined 05/27/2018   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   Bilateral leg edema 09/24/2017   Urinary retention due to benign prostatic hyperplasia 09/13/2017   S/P AVR 09/09/2017   Diastolic dysfunction without heart failure 08/19/2017   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/07/2017   Lower urinary tract symptoms (LUTS) 04/29/2017   Former smoker, stopped smoking in distant past    Dysthymia 03/18/2017   Irritability and anger 03/18/2017   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   Dyslipidemia, goal LDL below 70 03/18/2017   Postural dizziness with presyncope 08/27/2016   CKD (chronic kidney disease) stage 3, GFR 30-59 ml/min (HCC) 08/04/2016   CKD stage G3a/A1, GFR 45-59 and albumin  creatinine ratio <30 mg/g (HCC) 08/04/2016   Status post CVA 08/03/2016   Status post carotid endarterectomy  12/15/2015   Symptomatic carotid artery stenosis 12/15/2015   Hypertension goal BP (blood pressure) < 140/90 10/13/2015   Stroke (HCC) 09/2015   Psoriasis 12/02/2014    PCP: Alvia Bring, DO   REFERRING PROVIDER: Alvia Bring, DO   REFERRING DIAG:  [phone] (ICD-10-CM) - Strain of right biceps, initial encounter  M25.511 (ICD-10-CM) - Acute pain of right shoulder    THERAPY DIAG:  Chronic right shoulder pain  Muscle weakness (generalized)  Abnormal posture  Rationale for Evaluation and Treatment: Rehabilitation  ONSET DATE: chronic   SUBJECTIVE:  SUBJECTIVE STATEMENT: Patient reports he is sore from the exercises. Overall feels about the same.   EVAL: Patient reports for at least a year he has had pain in the Rt upper arm/shoulder. He does not recall a MOI. The pain has remained relatively unchanged since initial onset.  Depending upon the way he moves it arm the pain can be more localized to the distal bicep or the posterolateral upper arm/shoulder. What brought him into PT was difficulty with lifting. No numbness/tingling. Patient reports previous injury to his rotator cuff 5-10 years stating that for awhile he was just unable to lift the right arm on it's own, but did not receive any treatment.  Hand dominance: Right  PERTINENT HISTORY: CVA See PSH above   PAIN:  Are you having pain? Yes: NPRS scale: 2 Pain location: Rt upper lateral arm Pain description: ache Aggravating factors: lifting, maintaining flexed elbow Relieving factors: repositioning the arm (does not have one position of comfort)   PRECAUTIONS: None  RED FLAGS: None   WEIGHT BEARING RESTRICTIONS: No  FALLS:  Has patient fallen in last 6 months? No  LIVING ENVIRONMENT: Lives with: lives with their spouse Lives  in: House/apartment Stairs: Yes: Internal: flight steps; on left going up Has following equipment at home: None  OCCUPATION: Retired   PLOF: Independent  PATIENT GOALS: no pain.   NEXT MD VISIT: 09/24/24  OBJECTIVE:  Note: Objective measures were completed at Evaluation unless otherwise noted.  DIAGNOSTIC FINDINGS:  None   PATIENT SURVEYS :  Quick DASH: 22.7% disability   COGNITION: Overall cognitive status: Within functional limits for tasks assessed     SENSATION: Not tested  POSTURE: Forward head, rounded shoulders, kyphosis   UPPER EXTREMITY ROM:   Active ROM Right eval Left eval  Shoulder flexion 155 pain    Shoulder extension    Shoulder abduction Full painful arc   Shoulder adduction    Shoulder internal rotation 80 pain   Shoulder external rotation 70 pain    Elbow flexion    Elbow extension    Wrist flexion    Wrist extension    Wrist ulnar deviation    Wrist radial deviation    Wrist pronation    Wrist supination    (Blank rows = not tested)  UPPER EXTREMITY MMT:  MMT Right eval Left eval  Shoulder flexion 4 pain   Shoulder scaption 4- pain (most significant pain)    Shoulder abduction 5 pain   Shoulder adduction    Shoulder internal rotation 5 pain   Shoulder external rotation 4 pain    Middle trapezius    Lower trapezius    Elbow flexion    Elbow extension    Wrist flexion 5   Wrist extension    Wrist ulnar deviation    Wrist radial deviation    Wrist pronation    Wrist supination    Grip strength (lbs)    (Blank rows = not tested)  SHOULDER SPECIAL TESTS: Impingement tests: Neer impingement test: positive , Hawkins/Kennedy impingement test: positive , and Painful arc test: positive  Rotator cuff assessment: Empty can test: positive  Biceps assessment: Yergason's test: negative and Speed's test: positive   JOINT MOBILITY TESTING:  Not assessed  PALPATION:  TTP greater tubercle     [facility] Adult PT Treatment:  DATE: 04/08/24 Therapeutic Exercise: Pec doorway stretch 2 x 30 sec  Reviewed and updated HEP  Manual Therapy: STM Rt posterior rotator cuff and upper trap Demo and returned demo of use of theracane for self-soft tissue mobilization Neuromuscular re-ed: Scapular retraction 2 x 10 Reactive ER/IR isometrics x 10 each; red band  Resisted rows green band 2 x 10   '                                                                                                                     [facility] Adult PT Treatment:                                                DATE: 03/31/24 Therapeutic Exercise: Demonstrated,performed, and issued initial HEP.  Self Care: Ice for pain control Posture education and benefits of posture shirt Anatomy of current condition     PATIENT EDUCATION: Education details: see treatment Person educated: Patient Education method: Explanation, Demonstration, Tactile cues, Verbal cues, and Handouts Education comprehension: verbalized understanding, returned demonstration, verbal cues required, tactile cues required, and needs further education  HOME EXERCISE PROGRAM: Access Code: [email or web address] URL: [email or web address] Date: 04/08/2024 Prepared by: Lucie Meeter  Exercises - Seated Scapular Retraction  - 2 x daily - 7 x weekly - 2 sets - 10 reps - Doorway Pec Stretch at 60 Elevation  - 2 x daily - 7 x weekly - 3 sets - 30 sec  hold - Shoulder Internal Rotation Reactive Isometrics  - 1 x daily - 7 x weekly - 2 sets - 10 reps - Shoulder External Rotation Reactive Isometrics  - 1 x daily - 7 x weekly - 2 sets - 10 reps - Standing Shoulder Row with Anchored Resistance  - 1 x daily - 7 x weekly - 2 sets - 10 reps  ASSESSMENT:  CLINICAL IMPRESSION: Patient with notable tautness about posterior rotator cuff with partial release from manual therapy. He reported reduction in lateral arm pain following manual work. Discussed use of  theracane and where to purchase to complete self-soft tissue work as part of HEP. Reviewed initial HEP with patient requiring cues to reduce upper trap activation with postural correctives. No increase in shoulder/arm pain reported with strength progression.   EVAL: Patient is a 78 y.o. male who was seen today for physical therapy evaluation and treatment for chronic Rt shoulder/upper arm pain that has been ongoing for a year of insidious onset. Upon assessment he is noted to have painful Rt shoulder AROM in all planes with limitation noted in flexion and ER. He has weakness and pain with Rt shoulder MMT with most notable weakness and pain with scaption MMT. He has postural abnormalities: increased kyphosis, forward head, and rounded shoulders. He will benefit from skilled PT to address the above stated deficits in order to optimize his function and assist in overall  pain reduction.    OBJECTIVE IMPAIRMENTS: decreased activity tolerance, decreased knowledge of condition, decreased ROM, decreased strength, impaired flexibility, impaired UE functional use, postural dysfunction, and pain.   ACTIVITY LIMITATIONS: carrying, lifting, sleeping, and reach over head  PARTICIPATION LIMITATIONS: cleaning, community activity, and yard work  PERSONAL FACTORS: Age, Fitness, Time since onset of injury/illness/exacerbation, and 3+ comorbidities: see PMH above are also affecting patient's functional outcome.   REHAB POTENTIAL: Good  CLINICAL DECISION MAKING: Stable/uncomplicated  EVALUATION COMPLEXITY: Low  GOALS: Goals reviewed with patient? Yes  SHORT TERM GOALS: Target date: 04/21/2024    Patient will be independent and compliant with initial HEP.   Baseline:  initial HEP issued  Goal status: INITIAL  2.  Patient will demonstrate knowledge and application of appropriate sitting posture to reduce stress on his shoulder.  Baseline: see above Goal status: INITIAL  3.  Patient will report pain at  worst rated as </= 3/10 to signify improvement in current condition.  Baseline: 5 Goal status: INITIAL   LONG TERM GOALS: Target date: 05/16/24  Patient will score </= 13 % disability on the QuickDASH (MCID is 8-15.9) to signify clinically meaningful improvement in functional abilities.   Baseline: see above Goal status: INITIAL  2.  Patient will demonstrate at least 4+/5 Rt rotator cuff strength to improve shoulder stability.  Baseline: see above  Goal status: INITIAL  3.  Patient will demonstrate 5/5 Rt shoulder flexion strength to improve ability to lift items.  Baseline: see above  Goal status: INITIAL  4.  Patient will be independent with advanced HEP to progress/maintain current level of function.  Baseline: see above  Goal status: INITIAL  PLAN: PT FREQUENCY: 1-2x/week  PT DURATION: 6 weeks  PLANNED INTERVENTIONS: 02835- PT Re-evaluation, 97750- Physical Performance Testing, 97110-Therapeutic exercises, 97530- Therapeutic activity, [id number]- Neuromuscular re-education, 97535- Self Care, 02859- Manual therapy, [id number]- Electrical stimulation (unattended), [id number]- Electrical stimulation (manual), [id number]- Ionotophoresis 4mg /ml Dexamethasone, 79439 (1-2 muscles), 20561 (3+ muscles)- Dry Needling, Cryotherapy, and Moist heat  PLAN FOR NEXT SESSION: posture education, periscapular strengthening, progress pain free rotator cuff strengthening.    Marquez Ceesay, PT, DPT, ATC 04/08/24 10:56 AM

## 2024-04-15 ENCOUNTER — Ambulatory Visit

## 2024-04-15 DIAGNOSIS — G8929 Other chronic pain: Secondary | ICD-10-CM

## 2024-04-15 DIAGNOSIS — M6281 Muscle weakness (generalized): Secondary | ICD-10-CM

## 2024-04-15 DIAGNOSIS — R293 Abnormal posture: Secondary | ICD-10-CM

## 2024-04-15 DIAGNOSIS — M25511 Pain in right shoulder: Secondary | ICD-10-CM | POA: Diagnosis not present

## 2024-04-15 NOTE — Therapy (Signed)
 OUTPATIENT PHYSICAL THERAPY UPPER EXTREMITY TREATMENT   Patient Name: Leonard Padilla MRN: [id number] DOB:[birth year], 78 y.o., male Today's Date: 04/15/2024  END OF SESSION:  PT End of Session - 04/15/24 1140     Visit Number 3    Number of Visits 13    Date for PT Re-Evaluation 05/16/24    Authorization Type aetna MCR    Progress Note Due on Visit 10    PT Start Time 1145    PT Stop Time 1227    PT Time Calculation (min) 42 min    Activity Tolerance Patient tolerated treatment well    Behavior During Therapy [facility] for tasks assessed/performed           Past Medical History:  Diagnosis Date   Allergic conjunctivitis of left eye 10/16/2019   Aortic stenosis    moderate AS 12/01/15 echo (Dr. Inocencio)   Arthritis    Bilateral leg edema 09/24/2017   Chronic constipation 09/03/2021   CKD (chronic kidney disease) stage 3, GFR 30-59 ml/min (HCC) 08/04/2016   CKD stage G3a/A1, GFR 45-59 and albumin  creatinine ratio <30 mg/g (HCC) 08/04/2016   Colon cancer screening declined 05/27/2018   Combined form of age-related cataract, both eyes 08/12/2018   Coronary artery disease    Diastolic dysfunction without heart failure 08/19/2017   Dyslipidemia, goal LDL below 70 03/18/2017   Dysthymia 03/18/2017   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/07/2017   Mild, repeat in 5 years (2023)   Family history of prostate cancer in father 03/02/2019   Former smoker, stopped smoking in distant past    Heart murmur    History of colon polyps    History of colonic polyps 03/02/2019   Hyperlipidemia    Hypertension goal BP (blood pressure) < 140/90 10/13/2015   Hypertensive retinopathy of both eyes 08/12/2018   Intraoperative floppy iris syndrome (IFIS) 03/02/2021   Irritability and anger 03/18/2017   Lower urinary tract symptoms (LUTS) 04/29/2017   Posterior vitreous detachment, left eye 08/12/2018   Postural dizziness with presyncope 08/27/2016   Psoriasis    S/P AVR 09/09/2017   Edwards  Lifesciences 23 mm, model 3300TFX, serial #[id number]     SBE (subacute bacterial endocarditis) prophylaxis candidate 10/06/2018   Sleep apnea    to pick cpap tomorrow 12/13/15   Status post carotid endarterectomy 12/15/2015   Right sided B/l plaques <50% velocity (09/2016)   Status post CVA 08/03/2016   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   Stroke (HCC) 09/2015   Symptomatic carotid artery stenosis 12/15/2015   Urinary retention due to benign prostatic hyperplasia 09/13/2017   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   Wears glasses    Past Surgical History:  Procedure Laterality Date   AORTIC VALVE REPLACEMENT N/A 09/09/2017   Procedure: AORTIC VALVE REPLACEMENT (AVR) using a 23 Edwards Perimount Magna Ease Aortic Valve;  Surgeon: Army Dallas NOVAK, MD;  Location: [facility] OR;  Service: Open Heart Surgery;  Laterality: N/A;   COLONOSCOPY W/ BIOPSIES AND POLYPECTOMY     CORONARY ARTERY BYPASS GRAFT N/A 09/09/2017   Procedure: CORONARY ARTERY BYPASS GRAFTING (CABG) x 4 using the left internal mammary artery and right greater saphenous vein harvested endoscopically.;  Surgeon: Army Dallas NOVAK, MD;  Location: [facility] OR;  Service: Open Heart Surgery;  Laterality: N/A;   ENDARTERECTOMY Right 12/15/2015   Procedure: RIGHT CAROTID ENDARTERECTOMY WITH PATCH ANGIOPLASTY;  Surgeon: Gaile LELON New, MD;  Location: [facility] OR;  Service: Vascular;  Laterality: Right;   EYE SURGERY  Bilateral    Right 03/02/2021 Left 03/09/2021 Dr Gennie   [facility] ANGIOPLASTY Right 12/15/2015   Procedure: RIGHT CAROTID PATCH ANGIOPLASTY;  Surgeon: Gaile LELON New, MD;  Location: [facility] OR;  Service: Vascular;  Laterality: Right;   RIGHT/LEFT HEART CATH AND CORONARY ANGIOGRAPHY N/A 08/23/2017   Procedure: RIGHT/LEFT HEART CATH AND CORONARY ANGIOGRAPHY;  Surgeon: [place], Peter M, MD;  Location: [facility] INVASIVE CV LAB;  Service: Cardiovascular;  Laterality: N/A;   TEE WITHOUT CARDIOVERSION N/A 09/09/2017   Procedure:  TRANSESOPHAGEAL ECHOCARDIOGRAM (TEE);  Surgeon: Army Dallas NOVAK, MD;  Location: [facility] OR;  Service: Open Heart Surgery;  Laterality: N/A;   WISDOM TOOTH EXTRACTION     Patient Active Problem List   Diagnosis Date Noted   Strain of right biceps 03/24/2024   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   Well adult exam 09/20/2022   Non-recurrent acute suppurative otitis media of right ear without spontaneous rupture of tympanic membrane 04/03/2022   Actinic keratosis 12/14/2021   Chronic constipation 09/03/2021   Intraoperative floppy iris syndrome (IFIS) 03/02/2021   Aortic stenosis    Arthritis    Coronary artery disease    Heart murmur    History of colon polyps    Hyperlipidemia    Sleep apnea    Wears glasses    Allergic conjunctivitis of left eye 10/16/2019   History of colonic polyps 03/02/2019   Family history of prostate cancer in father 03/02/2019   SBE (subacute bacterial endocarditis) prophylaxis candidate 10/06/2018   Hypertensive retinopathy of both eyes 08/12/2018   Posterior vitreous detachment, left eye 08/12/2018   Colon cancer screening declined 05/27/2018   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   Bilateral leg edema 09/24/2017   Urinary retention due to benign prostatic hyperplasia 09/13/2017   S/P AVR 09/09/2017   Diastolic dysfunction without heart failure 08/19/2017   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/07/2017   Lower urinary tract symptoms (LUTS) 04/29/2017   Former smoker, stopped smoking in distant past    Dysthymia 03/18/2017   Irritability and anger 03/18/2017   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   Dyslipidemia, goal LDL below 70 03/18/2017   Postural dizziness with presyncope 08/27/2016   CKD (chronic kidney disease) stage 3, GFR 30-59 ml/min (HCC) 08/04/2016   CKD stage G3a/A1, GFR 45-59 and albumin  creatinine ratio <30 mg/g (HCC) 08/04/2016   Status post CVA 08/03/2016   Status post carotid endarterectomy  12/15/2015   Symptomatic carotid artery stenosis 12/15/2015   Hypertension goal BP (blood pressure) < 140/90 10/13/2015   Stroke (HCC) 09/2015   Psoriasis 12/02/2014    PCP: Alvia Bring, DO   REFERRING PROVIDER: Alvia Bring, DO   REFERRING DIAG:  [phone] (ICD-10-CM) - Strain of right biceps, initial encounter  M25.511 (ICD-10-CM) - Acute pain of right shoulder    THERAPY DIAG:  Chronic right shoulder pain  Muscle weakness (generalized)  Abnormal posture  Rationale for Evaluation and Treatment: Rehabilitation  ONSET DATE: chronic   SUBJECTIVE:  SUBJECTIVE STATEMENT: Patient reports the arm is still sore.   EVAL: Patient reports for at least a year he has had pain in the Rt upper arm/shoulder. He does not recall a MOI. The pain has remained relatively unchanged since initial onset.  Depending upon the way he moves it arm the pain can be more localized to the distal bicep or the posterolateral upper arm/shoulder. What brought him into PT was difficulty with lifting. No numbness/tingling. Patient reports previous injury to his rotator cuff 5-10 years stating that for awhile he was just unable to lift the right arm on it's own, but did not receive any treatment.  Hand dominance: Right  PERTINENT HISTORY: CVA See PSH above   PAIN:  Are you having pain? Yes: NPRS scale: 3 Pain location: Rt upper lateral arm Pain description: sore Aggravating factors: lifting, maintaining flexed elbow Relieving factors: repositioning the arm (does not have one position of comfort)   PRECAUTIONS: None  RED FLAGS: None   WEIGHT BEARING RESTRICTIONS: No  FALLS:  Has patient fallen in last 6 months? No  LIVING ENVIRONMENT: Lives with: lives with their spouse Lives in: House/apartment Stairs: Yes:  Internal: flight steps; on left going up Has following equipment at home: None  OCCUPATION: Retired   PLOF: Independent  PATIENT GOALS: no pain.   NEXT MD VISIT: 09/24/24  OBJECTIVE:  Note: Objective measures were completed at Evaluation unless otherwise noted.  DIAGNOSTIC FINDINGS:  None   PATIENT SURVEYS :  Quick DASH: 22.7% disability   COGNITION: Overall cognitive status: Within functional limits for tasks assessed     SENSATION: Not tested  POSTURE: Forward head, rounded shoulders, kyphosis   UPPER EXTREMITY ROM:   Active ROM Right eval Left eval 04/15/24 Right   Shoulder flexion 155 pain   165 pain   Shoulder extension     Shoulder abduction Full painful arc    Shoulder adduction     Shoulder internal rotation 80 pain  85 pain  Shoulder external rotation 70 pain   85 pain   Elbow flexion     Elbow extension     Wrist flexion     Wrist extension     Wrist ulnar deviation     Wrist radial deviation     Wrist pronation     Wrist supination     (Blank rows = not tested)  UPPER EXTREMITY MMT:  MMT Right eval Left eval  Shoulder flexion 4 pain   Shoulder scaption 4- pain (most significant pain)    Shoulder abduction 5 pain   Shoulder adduction    Shoulder internal rotation 5 pain   Shoulder external rotation 4 pain    Middle trapezius    Lower trapezius    Elbow flexion    Elbow extension    Wrist flexion 5   Wrist extension    Wrist ulnar deviation    Wrist radial deviation    Wrist pronation    Wrist supination    Grip strength (lbs)    (Blank rows = not tested)  SHOULDER SPECIAL TESTS: Impingement tests: Neer impingement test: positive , Hawkins/Kennedy impingement test: positive , and Painful arc test: positive  Rotator cuff assessment: Empty can test: positive  Biceps assessment: Yergason's test: negative and Speed's test: positive   JOINT MOBILITY TESTING:  Not assessed  PALPATION:  TTP greater tubercle  [facility] Adult PT  Treatment:  DATE: 04/15/24  Manual Therapy: STM Rt posterior rotator cuff, deltoid, bicep Rt GHJ mobilizations inferior/A/P grade II-III Neuromuscular re-ed: Sidelying shoulder ER 2 x 10; 2nd set with 1 lb  Supine horizontal shoulder abduction yellow band 2 x 10  Supine serratus punch 2 x 10  Bilateral shoulder ER yellow band 2 x 10  Therapeutic Activity: Standing towel wall slides x 10; flexion     [facility] Adult PT Treatment:                                                DATE: 04/08/24 Therapeutic Exercise: Pec doorway stretch 2 x 30 sec  Reviewed and updated HEP  Manual Therapy: STM Rt posterior rotator cuff and upper trap Demo and returned demo of use of theracane for self-soft tissue mobilization Neuromuscular re-ed: Scapular retraction 2 x 10 Reactive ER/IR isometrics x 10 each; red band  Resisted rows green band 2 x 10   '                                                                                                                     [facility] Adult PT Treatment:                                                DATE: 03/31/24 Therapeutic Exercise: Demonstrated,performed, and issued initial HEP.  Self Care: Ice for pain control Posture education and benefits of posture shirt Anatomy of current condition     PATIENT EDUCATION: Education details: HEP update Person educated: Patient Education method: Explanation, Demonstration, Tactile cues, Verbal cues, and Handouts Education comprehension: verbalized understanding, returned demonstration, verbal cues required, tactile cues required, and needs further education  HOME EXERCISE PROGRAM: Access Code: [email or web address] URL: [email or web address] Date: 04/15/2024 Prepared by: Lucie Meeter  Exercises - Seated Scapular Retraction  - 1 x daily - 7 x weekly - 2 sets - 10 reps - Doorway Pec Stretch at 60 Elevation  - 1 x daily - 7 x weekly - 3 sets - 30 sec  hold - Shoulder  Internal Rotation Reactive Isometrics  - 1 x daily - 7 x weekly - 2 sets - 10 reps - Shoulder External Rotation Reactive Isometrics  - 1 x daily - 7 x weekly - 2 sets - 10 reps - Standing Shoulder Row with Anchored Resistance  - 1 x daily - 7 x weekly - 2 sets - 10 reps - Supine Shoulder Horizontal Abduction with Resistance  - 1 x daily - 7 x weekly - 2 sets - 10 reps - Supine Bilateral Shoulder Protraction  - 1 x daily - 7 x weekly - 2 sets - 10 reps - Shoulder External Rotation and Scapular Retraction with Resistance  - 1 x  daily - 7 x weekly - 2 sets - 10 reps - Shoulder Flexion Wall Slide with Towel  - 1 x daily - 7 x weekly - 1 sets - 10 reps  ASSESSMENT:  CLINICAL IMPRESSION: Patient demonstrates improvements in Rt shoulder flexion, ER, and IR AROM compared to initial evaluation, though end ranges remain painful. Responds well to manual therapy reporting a reduction in upper arm pain following this intervention. Focused on progression of periscapular strengthening without onset of pain. With sidelying ER he reported mild discomfort about lateral upper arm, but overall tolerates well. Intermittent cues required to reduce upper trap compensation throughout session.   EVAL: Patient is a 78 y.o. male who was seen today for physical therapy evaluation and treatment for chronic Rt shoulder/upper arm pain that has been ongoing for a year of insidious onset. Upon assessment he is noted to have painful Rt shoulder AROM in all planes with limitation noted in flexion and ER. He has weakness and pain with Rt shoulder MMT with most notable weakness and pain with scaption MMT. He has postural abnormalities: increased kyphosis, forward head, and rounded shoulders. He will benefit from skilled PT to address the above stated deficits in order to optimize his function and assist in overall pain reduction.    OBJECTIVE IMPAIRMENTS: decreased activity tolerance, decreased knowledge of condition, decreased ROM,  decreased strength, impaired flexibility, impaired UE functional use, postural dysfunction, and pain.   ACTIVITY LIMITATIONS: carrying, lifting, sleeping, and reach over head  PARTICIPATION LIMITATIONS: cleaning, community activity, and yard work  PERSONAL FACTORS: Age, Fitness, Time since onset of injury/illness/exacerbation, and 3+ comorbidities: see PMH above are also affecting patient's functional outcome.   REHAB POTENTIAL: Good  CLINICAL DECISION MAKING: Stable/uncomplicated  EVALUATION COMPLEXITY: Low  GOALS: Goals reviewed with patient? Yes  SHORT TERM GOALS: Target date: 04/21/2024    Patient will be independent and compliant with initial HEP.   Baseline:  initial HEP issued  Goal status: INITIAL  2.  Patient will demonstrate knowledge and application of appropriate sitting posture to reduce stress on his shoulder.  Baseline: see above Goal status: INITIAL  3.  Patient will report pain at worst rated as </= 3/10 to signify improvement in current condition.  Baseline: 5 Goal status: INITIAL   LONG TERM GOALS: Target date: 05/16/24  Patient will score </= 13 % disability on the QuickDASH (MCID is 8-15.9) to signify clinically meaningful improvement in functional abilities.   Baseline: see above Goal status: INITIAL  2.  Patient will demonstrate at least 4+/5 Rt rotator cuff strength to improve shoulder stability.  Baseline: see above  Goal status: INITIAL  3.  Patient will demonstrate 5/5 Rt shoulder flexion strength to improve ability to lift items.  Baseline: see above  Goal status: INITIAL  4.  Patient will be independent with advanced HEP to progress/maintain current level of function.  Baseline: see above  Goal status: INITIAL  PLAN: PT FREQUENCY: 1-2x/week  PT DURATION: 6 weeks  PLANNED INTERVENTIONS: 02835- PT Re-evaluation, 97750- Physical Performance Testing, 97110-Therapeutic exercises, 97530- Therapeutic activity, [id number]- Neuromuscular  re-education, 97535- Self Care, 02859- Manual therapy, [id number]- Electrical stimulation (unattended), [id number]- Electrical stimulation (manual), [id number]- Ionotophoresis 4mg /ml Dexamethasone, 79439 (1-2 muscles), 20561 (3+ muscles)- Dry Needling, Cryotherapy, and Moist heat  PLAN FOR NEXT SESSION: posture education, periscapular strengthening, progress pain free rotator cuff strengthening.    Claudell Wohler, PT, DPT, ATC 04/15/24 12:34 PM

## 2024-04-22 ENCOUNTER — Encounter

## 2024-04-23 ENCOUNTER — Ambulatory Visit

## 2024-04-23 DIAGNOSIS — G8929 Other chronic pain: Secondary | ICD-10-CM

## 2024-04-23 DIAGNOSIS — R293 Abnormal posture: Secondary | ICD-10-CM

## 2024-04-23 DIAGNOSIS — M25511 Pain in right shoulder: Secondary | ICD-10-CM | POA: Diagnosis not present

## 2024-04-23 DIAGNOSIS — M6281 Muscle weakness (generalized): Secondary | ICD-10-CM

## 2024-04-23 NOTE — Therapy (Signed)
 OUTPATIENT PHYSICAL THERAPY UPPER EXTREMITY TREATMENT PHYSICAL THERAPY DISCHARGE SUMMARY  Visits from Start of Care: 4  Current functional level related to goals / functional outcomes: See goals below   Remaining deficits: See impression below   Education / Equipment: See education below    Patient agrees to discharge. Patient goals were partially met. Patient is being discharged due to did not respond to therapy.    Patient Name: Leonard Padilla MRN: [id number] DOB:28-Jun-1946, 78 y.o., male Today's Date: 04/23/2024  END OF SESSION:  PT End of Session - 04/23/24 1146     Visit Number 4    Number of Visits 13    Date for Recertification  05/16/24    Authorization Type aetna MCR    Progress Note Due on Visit 10    PT Start Time 1146    PT Stop Time 1211    PT Time Calculation (min) 25 min    Activity Tolerance Patient tolerated treatment well    Behavior During Therapy [facility] for tasks assessed/performed            Past Medical History:  Diagnosis Date   Allergic conjunctivitis of left eye 10/16/2019   Aortic stenosis    moderate AS 12/01/15 echo (Dr. Inocencio)   Arthritis    Bilateral leg edema 09/24/2017   Chronic constipation 09/03/2021   CKD (chronic kidney disease) stage 3, GFR 30-59 ml/min (HCC) 08/04/2016   CKD stage G3a/A1, GFR 45-59 and albumin  creatinine ratio <30 mg/g (HCC) 08/04/2016   Colon cancer screening declined 05/27/2018   Combined form of age-related cataract, both eyes 08/12/2018   Coronary artery disease    Diastolic dysfunction without heart failure 08/19/2017   Dyslipidemia, goal LDL below 70 03/18/2017   Dysthymia 03/18/2017   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/07/2017   Mild, repeat in 5 years (2023)   Family history of prostate cancer in father 03/02/2019   Former smoker, stopped smoking in distant past    Heart murmur    History of colon polyps    History of colonic polyps 03/02/2019   Hyperlipidemia    Hypertension goal BP (blood  pressure) < 140/90 10/13/2015   Hypertensive retinopathy of both eyes 08/12/2018   Intraoperative floppy iris syndrome (IFIS) 03/02/2021   Irritability and anger 03/18/2017   Lower urinary tract symptoms (LUTS) 04/29/2017   Posterior vitreous detachment, left eye 08/12/2018   Postural dizziness with presyncope 08/27/2016   Psoriasis    S/P AVR 09/09/2017   Edwards Lifesciences 23 mm, model 3300TFX, serial #[id number]     SBE (subacute bacterial endocarditis) prophylaxis candidate 10/06/2018   Sleep apnea    to pick cpap tomorrow 12/13/15   Status post carotid endarterectomy 12/15/2015   Right sided B/l plaques <50% velocity (09/2016)   Status post CVA 08/03/2016   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   Stroke (HCC) 09/2015   Symptomatic carotid artery stenosis 12/15/2015   Urinary retention due to benign prostatic hyperplasia 09/13/2017   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   Wears glasses    Past Surgical History:  Procedure Laterality Date   AORTIC VALVE REPLACEMENT N/A 09/09/2017   Procedure: AORTIC VALVE REPLACEMENT (AVR) using a 23 Edwards Perimount Magna Ease Aortic Valve;  Surgeon: Army Dallas NOVAK, MD;  Location: [facility] OR;  Service: Open Heart Surgery;  Laterality: N/A;   COLONOSCOPY W/ BIOPSIES AND POLYPECTOMY     CORONARY ARTERY BYPASS GRAFT N/A 09/09/2017   Procedure: CORONARY ARTERY BYPASS GRAFTING (CABG) x 4 using  the left internal mammary artery and right greater saphenous vein harvested endoscopically.;  Surgeon: Army Dallas NOVAK, MD;  Location: [facility] OR;  Service: Open Heart Surgery;  Laterality: N/A;   ENDARTERECTOMY Right 12/15/2015   Procedure: RIGHT CAROTID ENDARTERECTOMY WITH PATCH ANGIOPLASTY;  Surgeon: Gaile LELON New, MD;  Location: [facility] OR;  Service: Vascular;  Laterality: Right;   EYE SURGERY Bilateral    Right 03/02/2021 Left 03/09/2021 Dr Gennie   [facility] ANGIOPLASTY Right 12/15/2015   Procedure: RIGHT CAROTID PATCH ANGIOPLASTY;  Surgeon:  Gaile LELON New, MD;  Location: [facility] OR;  Service: Vascular;  Laterality: Right;   RIGHT/LEFT HEART CATH AND CORONARY ANGIOGRAPHY N/A 08/23/2017   Procedure: RIGHT/LEFT HEART CATH AND CORONARY ANGIOGRAPHY;  Surgeon: [place], Peter M, MD;  Location: [facility] INVASIVE CV LAB;  Service: Cardiovascular;  Laterality: N/A;   TEE WITHOUT CARDIOVERSION N/A 09/09/2017   Procedure: TRANSESOPHAGEAL ECHOCARDIOGRAM (TEE);  Surgeon: Army Dallas NOVAK, MD;  Location: [facility] OR;  Service: Open Heart Surgery;  Laterality: N/A;   WISDOM TOOTH EXTRACTION     Patient Active Problem List   Diagnosis Date Noted   Strain of right biceps 03/24/2024   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   Well adult exam 09/20/2022   Non-recurrent acute suppurative otitis media of right ear without spontaneous rupture of tympanic membrane 04/03/2022   Actinic keratosis 12/14/2021   Chronic constipation 09/03/2021   Intraoperative floppy iris syndrome (IFIS) 03/02/2021   Aortic stenosis    Arthritis    Coronary artery disease    Heart murmur    History of colon polyps    Hyperlipidemia    Sleep apnea    Wears glasses    Allergic conjunctivitis of left eye 10/16/2019   History of colonic polyps 03/02/2019   Family history of prostate cancer in father 03/02/2019   SBE (subacute bacterial endocarditis) prophylaxis candidate 10/06/2018   Hypertensive retinopathy of both eyes 08/12/2018   Posterior vitreous detachment, left eye 08/12/2018   Colon cancer screening declined 05/27/2018   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   Bilateral leg edema 09/24/2017   Urinary retention due to benign prostatic hyperplasia 09/13/2017   S/P AVR 09/09/2017   Diastolic dysfunction without heart failure 08/19/2017   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/07/2017   Lower urinary tract symptoms (LUTS) 04/29/2017   Former smoker, stopped smoking in distant past    Dysthymia 03/18/2017   Irritability and anger 03/18/2017    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   Dyslipidemia, goal LDL below 70 03/18/2017   Postural dizziness with presyncope 08/27/2016   CKD (chronic kidney disease) stage 3, GFR 30-59 ml/min (HCC) 08/04/2016   CKD stage G3a/A1, GFR 45-59 and albumin  creatinine ratio <30 mg/g (HCC) 08/04/2016   Status post CVA 08/03/2016   Status post carotid endarterectomy 12/15/2015   Symptomatic carotid artery stenosis 12/15/2015   Hypertension goal BP (blood pressure) < 140/90 10/13/2015   Stroke (HCC) 09/2015   Psoriasis 12/02/2014    PCP: Alvia Bring, DO   REFERRING PROVIDER: Alvia Bring, DO   REFERRING DIAG:  [phone] (ICD-10-CM) - Strain of right biceps, initial encounter  M25.511 (ICD-10-CM) - Acute pain of right shoulder    THERAPY DIAG:  Chronic right shoulder pain  Muscle weakness (generalized)  Abnormal posture  Rationale for Evaluation and Treatment: Rehabilitation  ONSET DATE: chronic   SUBJECTIVE:  SUBJECTIVE STATEMENT: Patient reports ongoing upper arm pain and does not feel like it is improving. The elbow pain has resolved, but the upper arm pain is present and is limiting his ability to complete reaching and  lifting activity.   EVAL: Patient reports for at least a year he has had pain in the Rt upper arm/shoulder. He does not recall a MOI. The pain has remained relatively unchanged since initial onset.  Depending upon the way he moves it arm the pain can be more localized to the distal bicep or the posterolateral upper arm/shoulder. What brought him into PT was difficulty with lifting. No numbness/tingling. Patient reports previous injury to his rotator cuff 5-10 years stating that for awhile he was just unable to lift the right arm on it's own, but did not receive any treatment.  Hand  dominance: Right  PERTINENT HISTORY: CVA See PSH above   PAIN:  Are you having pain? Yes: NPRS scale: 3-4 currently; 5 at worst  Pain location: Rt lateral shoulder/upper arm Pain description: ache;sore Aggravating factors: lifting, reaching Relieving factors: rest  PRECAUTIONS: None  RED FLAGS: None   WEIGHT BEARING RESTRICTIONS: No  FALLS:  Has patient fallen in last 6 months? No  LIVING ENVIRONMENT: Lives with: lives with their spouse Lives in: House/apartment Stairs: Yes: Internal: flight steps; on left going up Has following equipment at home: None  OCCUPATION: Retired   PLOF: Independent  PATIENT GOALS: no pain.   NEXT MD VISIT: 09/24/24  OBJECTIVE:  Note: Objective measures were completed at Evaluation unless otherwise noted.  DIAGNOSTIC FINDINGS:  None   PATIENT SURVEYS :  Quick DASH: 22.7% disability  QuickDASH 25% disability   COGNITION: Overall cognitive status: Within functional limits for tasks assessed     SENSATION: Not tested  POSTURE: Forward head, rounded shoulders, kyphosis   UPPER EXTREMITY ROM:   Active ROM Right eval Left eval 04/15/24 Right  04/23/24 Right   Shoulder flexion 155 pain   165 pain  165 pain  Shoulder extension      Shoulder abduction Full painful arc   Full painful arc  Shoulder adduction      Shoulder internal rotation 80 pain  85 pain   Shoulder external rotation 70 pain   85 pain    Elbow flexion      Elbow extension      Wrist flexion      Wrist extension      Wrist ulnar deviation      Wrist radial deviation      Wrist pronation      Wrist supination      (Blank rows = not tested)  UPPER EXTREMITY MMT:  MMT Right eval Left eval 04/23/24 Right   Shoulder flexion 4 pain  4- pain   Shoulder scaption 4- pain (most significant pain)   4- pain  Shoulder abduction 5 pain  5 pain   Shoulder adduction     Shoulder internal rotation 5 pain  5  Shoulder external rotation 4 pain   4 pain   Middle  trapezius     Lower trapezius     Elbow flexion   5  Elbow extension   5  Wrist flexion 5    Wrist extension     Wrist ulnar deviation     Wrist radial deviation     Wrist pronation     Wrist supination     Grip strength (lbs)     (Blank rows = not tested)  SHOULDER SPECIAL TESTS: Impingement tests: Neer impingement test: positive , Hawkins/Kennedy impingement test: positive , and Painful arc test: positive  Rotator cuff assessment: Empty can test: positive  Biceps assessment: Yergason's test: negative and Speed's test: positive    04/23/24: (+) Painful Arc, (-) Drop Arm  JOINT MOBILITY TESTING:  Not assessed  PALPATION:  TTP greater tubercle  [facility] Adult PT Treatment:                                                DATE: 04/23/24 Therapeutic Exercise: Reviewed and updated HEP discussing frequency, sets, reps   Therapeutic Activity: Re-assessment to determine overall progress, educating patient on progress towards goals  Self Care: Recommend f/u with referring provider for further assessment of ongoing pain  Modalities for pain control to utilize at home as needed    [facility] Adult PT Treatment:                                                DATE: 04/15/24  Manual Therapy: STM Rt posterior rotator cuff, deltoid, bicep Rt GHJ mobilizations inferior/A/P grade II-III Neuromuscular re-ed: Sidelying shoulder ER 2 x 10; 2nd set with 1 lb  Supine horizontal shoulder abduction yellow band 2 x 10  Supine serratus punch 2 x 10  Bilateral shoulder ER yellow band 2 x 10  Therapeutic Activity: Standing towel wall slides x 10; flexion     [facility] Adult PT Treatment:                                                DATE: 04/08/24 Therapeutic Exercise: Pec doorway stretch 2 x 30 sec  Reviewed and updated HEP  Manual Therapy: STM Rt posterior rotator cuff and upper trap Demo and returned demo of use of theracane for self-soft tissue mobilization Neuromuscular re-ed: Scapular retraction 2 x  10 Reactive ER/IR isometrics x 10 each; red band  Resisted rows green band 2 x 10   '                                                                                                                     [facility] Adult PT Treatment:                                                DATE: 03/31/24 Therapeutic Exercise: Demonstrated,performed, and issued initial HEP.  Self Care: Ice for pain control Posture education and benefits of posture shirt Anatomy of current  condition     PATIENT EDUCATION: Education details: see treatment; d/c education  Person educated: Patient Education method: Explanation and Demonstration Education comprehension: verbalized understanding and returned demonstration  HOME EXERCISE PROGRAM: Access Code: [email or web address] URL: [email or web address] Date: 04/23/2024 Prepared by: Lucie Meeter  Exercises - Seated Scapular Retraction  - 1 x daily - 7 x weekly - 2 sets - 10 reps - Shoulder Flexion Wall Slide with Towel  - 1 x daily - 7 x weekly - 1 sets - 10 reps - Doorway Pec Stretch at 60 Elevation  - 1 x daily - 7 x weekly - 3 sets - 30 sec  hold - Shoulder Internal Rotation Reactive Isometrics  - 1 x daily - 3 x weekly - 2 sets - 10 reps - Shoulder External Rotation Reactive Isometrics  - 1 x daily - 3 x weekly - 2 sets - 10 reps - Standing Shoulder Row with Anchored Resistance  - 1 x daily - 3 x weekly - 2 sets - 10 reps - Supine Shoulder Horizontal Abduction with Resistance  - 1 x daily - 3 x weekly - 2 sets - 10 reps - Supine Bilateral Shoulder Protraction  - 1 x daily - 3 x weekly - 2 sets - 10 reps - Shoulder External Rotation and Scapular Retraction with Resistance  - 1 x daily - 3 x weekly - 2 sets - 10 reps  ASSESSMENT:  CLINICAL IMPRESSION: Uel has attended 4 PT sessions reporting no improvement in Rt shoulder/upper arm pain. Minimal improvement noted in shoulder flexion, ER, IR AROM compared to initial evaluation, though all AROM remains painful.  No change in shoulder strength compared to initial evaluation with slight regression in shoulder flexion strength. Due to lack of improvement patient is appropriate for discharge at this time with plans to f/u with referring provider for further assessment with patient in agreement with this plan.   EVAL: Patient is a 78 y.o. male who was seen today for physical therapy evaluation and treatment for chronic Rt shoulder/upper arm pain that has been ongoing for a year of insidious onset. Upon assessment he is noted to have painful Rt shoulder AROM in all planes with limitation noted in flexion and ER. He has weakness and pain with Rt shoulder MMT with most notable weakness and pain with scaption MMT. He has postural abnormalities: increased kyphosis, forward head, and rounded shoulders. He will benefit from skilled PT to address the above stated deficits in order to optimize his function and assist in overall pain reduction.    OBJECTIVE IMPAIRMENTS: decreased activity tolerance, decreased knowledge of condition, decreased ROM, decreased strength, impaired flexibility, impaired UE functional use, postural dysfunction, and pain.   ACTIVITY LIMITATIONS: carrying, lifting, sleeping, and reach over head  PARTICIPATION LIMITATIONS: cleaning, community activity, and yard work  PERSONAL FACTORS: Age, Fitness, Time since onset of injury/illness/exacerbation, and 3+ comorbidities: see PMH above are also affecting patient's functional outcome.   REHAB POTENTIAL: Good  CLINICAL DECISION MAKING: Stable/uncomplicated  EVALUATION COMPLEXITY: Low  GOALS: Goals reviewed with patient? Yes  SHORT TERM GOALS: Target date: 04/21/2024    Patient will be independent and compliant with initial HEP.   Baseline:  initial HEP issued  Goal status: met  2.  Patient will demonstrate knowledge and application of appropriate sitting posture to reduce stress on his shoulder.  Baseline: see above 04/23/24: forward head,  rounded shoulder, increased kyphosis  Goal status: not met   3.  Patient will report pain at worst rated  as </= 3/10 to signify improvement in current condition.  Baseline: 5 Goal status: not met    LONG TERM GOALS: Target date: 05/16/24  Patient will score </= 13 % disability on the QuickDASH (MCID is 8-15.9) to signify clinically meaningful improvement in functional abilities.   Baseline: see above Goal status: not met   2.  Patient will demonstrate at least 4+/5 Rt rotator cuff strength to improve shoulder stability.  Baseline: see above  Goal status: not met   3.  Patient will demonstrate 5/5 Rt shoulder flexion strength to improve ability to lift items.  Baseline: see above  Goal status: not met   4.  Patient will be independent with advanced HEP to progress/maintain current level of function.  Baseline: see above  Goal status: met  PLAN: PT FREQUENCY: 1-2x/week  PT DURATION: 6 weeks  PLANNED INTERVENTIONS: [id number]- PT Re-evaluation, 97750- Physical Performance Testing, 97110-Therapeutic exercises, 97530- Therapeutic activity, [id number]- Neuromuscular re-education, 97535- Self Care, [id number]- Manual therapy, [id number]- Electrical stimulation (unattended), [id number]- Electrical stimulation (manual), [id number]- Ionotophoresis 4mg /ml Dexamethasone, 79439 (1-2 muscles), 20561 (3+ muscles)- Dry Needling, Cryotherapy, and Moist heat     Lucie Meeter, PT, DPT, ATC 04/23/24 12:25 PM

## 2024-04-29 ENCOUNTER — Encounter

## 2024-04-29 ENCOUNTER — Ambulatory Visit (INDEPENDENT_AMBULATORY_CARE_PROVIDER_SITE_OTHER)

## 2024-04-29 ENCOUNTER — Encounter: Payer: Self-pay | Admitting: Family Medicine

## 2024-04-29 ENCOUNTER — Ambulatory Visit (INDEPENDENT_AMBULATORY_CARE_PROVIDER_SITE_OTHER): Admitting: Family Medicine

## 2024-04-29 VITALS — BP 112/70 | HR 82 | Ht 67.72 in | Wt 162.0 lb

## 2024-04-29 DIAGNOSIS — M25511 Pain in right shoulder: Secondary | ICD-10-CM

## 2024-04-29 HISTORY — DX: Pain in right shoulder: M25.511

## 2024-04-29 NOTE — Progress Notes (Signed)
 Leonard Padilla - 78 y.o. male MRN [id number]  Date of birth: 11-27-1945  Subjective Chief Complaint  Patient presents with   Rehab    HPI Leonard Padilla is a 78 y.o. male here today for follow up of upper arm and bicep pain.  He has been seeing PT x4 weeks but doesn't feel that the upper arm and bicep pain is really improving.  He has had improvement in elbow pain. He has decreased ROM of the shoulder with feeling of weakness on abduction and flexion.  He has not noticed swelling and does not recall any MOI.    ROS:  A comprehensive ROS was completed and negative except as noted per HPI  Allergies  Allergen Reactions   Viagra [Sildenafil Citrate] Other (See Comments)    COLOR DISCRIMINATION [DOSE RELATED] blue sensation   Metoprolol  Other (See Comments)    Orthostasis, near syncope    Past Medical History:  Diagnosis Date   Allergic conjunctivitis of left eye 10/16/2019   Aortic stenosis    moderate AS 12/01/15 echo (Dr. Inocencio)   Arthritis    Bilateral leg edema 09/24/2017   Chronic constipation 09/03/2021   CKD (chronic kidney disease) stage 3, GFR 30-59 ml/min (HCC) 08/04/2016   CKD stage G3a/A1, GFR 45-59 and albumin  creatinine ratio <30 mg/g (HCC) 08/04/2016   Colon cancer screening declined 05/27/2018   Combined form of age-related cataract, both eyes 08/12/2018   Coronary artery disease    Diastolic dysfunction without heart failure 08/19/2017   Dyslipidemia, goal LDL below 70 03/18/2017   Dysthymia 03/18/2017   Ectatic abdominal aorta 05/07/2017   Mild, repeat in 5 years (2023)   Family history of prostate cancer in father 03/02/2019   Former smoker, stopped smoking in distant past    Heart murmur    History of colon polyps    History of colonic polyps 03/02/2019   Hyperlipidemia    Hypertension goal BP (blood pressure) < 140/90 10/13/2015   Hypertensive retinopathy of both eyes 08/12/2018   Intraoperative floppy iris syndrome (IFIS) 03/02/2021   Irritability  and anger 03/18/2017   Lower urinary tract symptoms (LUTS) 04/29/2017   Posterior vitreous detachment, left eye 08/12/2018   Postural dizziness with presyncope 08/27/2016   Psoriasis    S/P AVR 09/09/2017   Edwards Lifesciences 23 mm, model 3300TFX, serial #[id number]     SBE (subacute bacterial endocarditis) prophylaxis candidate 10/06/2018   Sleep apnea    to pick cpap tomorrow 12/13/15   Status post carotid endarterectomy 12/15/2015   Right sided B/l plaques <50% velocity (09/2016)   Status post CVA 08/03/2016   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   Stroke (HCC) 09/2015   Symptomatic carotid artery stenosis 12/15/2015   Urinary retention due to benign prostatic hyperplasia 09/13/2017   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   Wears glasses     Past Surgical History:  Procedure Laterality Date   AORTIC VALVE REPLACEMENT N/A 09/09/2017   Procedure: AORTIC VALVE REPLACEMENT (AVR) using a 23 Edwards Perimount Magna Ease Aortic Valve;  Surgeon: Army Dallas NOVAK, MD;  Location: [facility] OR;  Service: Open Heart Surgery;  Laterality: N/A;   COLONOSCOPY W/ BIOPSIES AND POLYPECTOMY     CORONARY ARTERY BYPASS GRAFT N/A 09/09/2017   Procedure: CORONARY ARTERY BYPASS GRAFTING (CABG) x 4 using the left internal mammary artery and right greater saphenous vein harvested endoscopically.;  Surgeon: Army Dallas NOVAK, MD;  Location: [facility] OR;  Service: Open Heart Surgery;  Laterality: N/A;  ENDARTERECTOMY Right 12/15/2015   Procedure: RIGHT CAROTID ENDARTERECTOMY WITH PATCH ANGIOPLASTY;  Surgeon: Gaile LELON New, MD;  Location: [facility] OR;  Service: Vascular;  Laterality: Right;   EYE SURGERY Bilateral    Right 03/02/2021 Left 03/09/2021 Dr Gennie   [facility] ANGIOPLASTY Right 12/15/2015   Procedure: RIGHT CAROTID PATCH ANGIOPLASTY;  Surgeon: Gaile LELON New, MD;  Location: [facility] OR;  Service: Vascular;  Laterality: Right;   RIGHT/LEFT HEART CATH AND CORONARY ANGIOGRAPHY N/A 08/23/2017    Procedure: RIGHT/LEFT HEART CATH AND CORONARY ANGIOGRAPHY;  Surgeon: Swazil[place], Peter M, MD;  Location: [facility] INVASIVE CV LAB;  Service: Cardiovascular;  Laterality: N/A;   TEE WITHOUT CARDIOVERSION N/A 09/09/2017   Procedure: TRANSESOPHAGEAL ECHOCARDIOGRAM (TEE);  Surgeon: Army Dallas NOVAK, MD;  Location: [facility] OR;  Service: Open Heart Surgery;  Laterality: N/A;   WISDOM TOOTH EXTRACTION      Social History   Socioeconomic History   Marital status: Married    Spouse name: [name]   Number of children: 2   Years of education: 13   Highest education level: Some college, no degree  Occupational History   Occupation: Retired    Comment: retired Clinical biochemist, [employer]  Tobacco Use   Smoking status: Former    Current packs/day: 0.00    Types: Cigarettes    Quit date: 08/07/1983    Years since quitting: 40.7   Smokeless tobacco: Never  Vaping Use   Vaping status: Never Used  Substance and Sexual Activity   Alcohol use: Yes    Comment: 1 glass of wine daily    Drug use: No   Sexual activity: Not Currently  Other Topics Concern   Not on file  Social History Narrative   Lives with wife   Caffeine use- coffee 3-4 cups daily   Social Drivers of Health   Financial Resource Strain: Low Risk  (03/20/2024)   Overall Financial Resource Strain (CARDIA)    Difficulty of Paying Living Expenses: Not hard at all  Food Insecurity: No Food Insecurity (03/20/2024)   Hunger Vital Sign    Worried About Running Out of Food in the Last Year: Never true    Ran Out of Food in the Last Year: Never true  Transportation Needs: No Transportation Needs (03/20/2024)   PRAPARE - Administrator, Civil Service (Medical): No    Lack of Transportation (Non-Medical): No  Physical Activity: Insufficiently Active (03/20/2024)   Exercise Vital Sign    Days of Exercise per Week: 3 days    Minutes of Exercise per Session: 20 min  Stress: No Stress Concern Present (03/20/2024)   [employer] of Occupational  Health - Occupational Stress Questionnaire    Feeling of Stress: Not at all  Social Connections: Unknown (03/20/2024)   Social Connection and Isolation Panel    Frequency of Communication with Friends and Family: Once a week    Frequency of Social Gatherings with Friends and Family: Once a week    Attends Religious Services: Not on Marketing executive or Organizations: No    Attends Banker Meetings: Not on file    Marital Status: Married    Family History  Problem Relation Age of Onset   Cancer Mother        ovarian cancer   Cancer Father        Prostate    Health Maintenance  Topic Date Due   Medicare Annual Wellness (AWV)  03/03/2021   COVID-19 Vaccine (7 -  2025-26 season) 04/06/2024   Influenza Vaccine  11/03/2024 (Originally 03/06/2024)   Pneumococcal Vaccine: 50+ Years  Completed   Hepatitis C Screening  Completed   Zoster Vaccines- Shingrix  Completed   HPV VACCINES  Aged Out   Meningococcal B Vaccine  Aged Out   DTaP/Tdap/Td  Discontinued     ----------------------------------------------------------------------------------------------------------------------------------------------------------------------------------------------------------------- Physical Exam BP 112/70 (BP Location: Left Arm, Patient Position: Sitting, Cuff Size: Normal)   Pulse 82   Ht 5' 7.72 (1.72 m)   Wt 162 lb (73.5 kg)   SpO2 97%   BMI 24.84 kg/m   Physical Exam Constitutional:      Appearance: Normal appearance.  HENT:     Head: Normocephalic and atraumatic.  Cardiovascular:     Rate and Rhythm: Normal rate.  Musculoskeletal:     Comments: ROM is limited in abduction and flexion.  He has weakness with abduction.  Negative drop arm sign.  Additonal rotator cuff testing with normal strength.  He has some pain with Yergason test.    Neurological:     Mental Status: He is alert.      ------------------------------------------------------------------------------------------------------------------------------------------------------------------------------------------------------------------- Assessment and Plan  Acute pain of right shoulder He has completed 4 weeks of PT without improvement.  Suspect rotator cuff and/or labral pathology.  MRI of the shoulder ordered.     No orders of the defined types were placed in this encounter.   No follow-ups on file.

## 2024-04-29 NOTE — Assessment & Plan Note (Signed)
 He has completed 4 weeks of PT without improvement.  Suspect rotator cuff and/or labral pathology.  MRI of the shoulder ordered.

## 2024-04-30 ENCOUNTER — Ambulatory Visit

## 2024-05-03 ENCOUNTER — Encounter: Payer: Self-pay | Admitting: Family Medicine

## 2024-05-06 ENCOUNTER — Encounter

## 2024-05-07 ENCOUNTER — Encounter

## 2024-05-11 ENCOUNTER — Encounter: Payer: Self-pay | Admitting: Family Medicine

## 2024-05-13 ENCOUNTER — Encounter

## 2024-05-14 ENCOUNTER — Encounter

## 2024-05-15 NOTE — Telephone Encounter (Signed)
 Faxed to  expedited appeal # [id number] - OV notes, PT notes and x-ray results.

## 2024-05-18 NOTE — Telephone Encounter (Signed)
 Medical records have been re- sent with attached coverage sheet.

## 2024-05-25 ENCOUNTER — Ambulatory Visit

## 2024-05-25 DIAGNOSIS — M25511 Pain in right shoulder: Secondary | ICD-10-CM

## 2024-05-26 ENCOUNTER — Ambulatory Visit: Payer: Self-pay | Admitting: Family Medicine

## 2024-05-26 DIAGNOSIS — M75121 Complete rotator cuff tear or rupture of right shoulder, not specified as traumatic: Secondary | ICD-10-CM

## 2024-06-11 ENCOUNTER — Other Ambulatory Visit: Payer: Self-pay | Admitting: Family Medicine

## 2024-07-15 ENCOUNTER — Ambulatory Visit (HOSPITAL_BASED_OUTPATIENT_CLINIC_OR_DEPARTMENT_OTHER): Payer: Self-pay | Admitting: Orthopaedic Surgery

## 2024-07-15 ENCOUNTER — Ambulatory Visit (HOSPITAL_BASED_OUTPATIENT_CLINIC_OR_DEPARTMENT_OTHER): Admitting: Orthopaedic Surgery

## 2024-07-15 DIAGNOSIS — S46011A Strain of muscle(s) and tendon(s) of the rotator cuff of right shoulder, initial encounter: Secondary | ICD-10-CM

## 2024-07-15 NOTE — Progress Notes (Signed)
 Chief Complaint: Right shoulder pain     History of Present Illness:    GAMAL TODISCO is a 78 y.o. male right dominant male presents with multiple years of ongoing right shoulder pain.  He has been doing physical therapy for the last several months without any relief.  He does enjoy being active and is retired.  He does enjoy swimming in his pool which has been limited due to pain.  He did not have any specific injury in the right shoulder.  He is having difficulty with overhead activity and activities of daily living    PMH/PSH/Family History/Social History/Meds/Allergies:    Past Medical History:  Diagnosis Date   Allergic conjunctivitis of left eye 10/16/2019   Aortic stenosis    moderate AS 12/01/15 echo (Dr. Inocencio)   Arthritis    Bilateral leg edema 09/24/2017   Chronic constipation 09/03/2021   CKD (chronic kidney disease) stage 3, GFR 30-59 ml/min (HCC) 08/04/2016   CKD stage G3a/A1, GFR 45-59 and albumin  creatinine ratio <30 mg/g (HCC) 08/04/2016   Colon cancer screening declined 05/27/2018   Combined form of age-related cataract, both eyes 08/12/2018   Coronary artery disease    Diastolic dysfunction without heart failure 08/19/2017   Dyslipidemia, goal LDL below 70 03/18/2017   Dysthymia 03/18/2017   Ectatic abdominal aorta 05/07/2017   Mild, repeat in 5 years (2023)   Family history of prostate cancer in father 03/02/2019   Former smoker, stopped smoking in distant past    Heart murmur    History of colon polyps    History of colonic polyps 03/02/2019   Hyperlipidemia    Hypertension goal BP (blood pressure) < 140/90 10/13/2015   Hypertensive retinopathy of both eyes 08/12/2018   Intraoperative floppy iris syndrome (IFIS) 03/02/2021   Irritability and anger 03/18/2017   Lower urinary tract symptoms (LUTS) 04/29/2017   Posterior vitreous detachment, left eye 08/12/2018   Postural dizziness with presyncope 08/27/2016   Psoriasis    S/P AVR 09/09/2017    Edwards Lifesciences 23 mm, model 3300TFX, serial #[id number]     SBE (subacute bacterial endocarditis) prophylaxis candidate 10/06/2018   Sleep apnea    to pick cpap tomorrow 12/13/15   Status post carotid endarterectomy 12/15/2015   Right sided B/l plaques <50% velocity (09/2016)   Status post CVA 08/03/2016   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   Stroke (HCC) 09/2015   Symptomatic carotid artery stenosis 12/15/2015   Urinary retention due to benign prostatic hyperplasia 09/13/2017   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   Wears glasses    Past Surgical History:  Procedure Laterality Date   AORTIC VALVE REPLACEMENT N/A 09/09/2017   Procedure: AORTIC VALVE REPLACEMENT (AVR) using a 23 Edwards Perimount Magna Ease Aortic Valve;  Surgeon: Army Dallas NOVAK, MD;  Location: [facility] OR;  Service: Open Heart Surgery;  Laterality: N/A;   COLONOSCOPY W/ BIOPSIES AND POLYPECTOMY     CORONARY ARTERY BYPASS GRAFT N/A 09/09/2017   Procedure: CORONARY ARTERY BYPASS GRAFTING (CABG) x 4 using the left internal mammary artery and right greater saphenous vein harvested endoscopically.;  Surgeon: Army Dallas NOVAK, MD;  Location: [facility] OR;  Service: Open Heart Surgery;  Laterality: N/A;   ENDARTERECTOMY Right 12/15/2015   Procedure: RIGHT CAROTID ENDARTERECTOMY WITH PATCH ANGIOPLASTY;  Surgeon: Gaile LELON New, MD;  Location: [facility] OR;  Service: Vascular;  Laterality: Right;   EYE SURGERY Bilateral    Right 03/02/2021 Left 03/09/2021 Dr Gennie   [facility] ANGIOPLASTY  Right 12/15/2015   Procedure: RIGHT CAROTID PATCH ANGIOPLASTY;  Surgeon: Gaile LELON New, MD;  Location: [facility] OR;  Service: Vascular;  Laterality: Right;   RIGHT/LEFT HEART CATH AND CORONARY ANGIOGRAPHY N/A 08/23/2017   Procedure: RIGHT/LEFT HEART CATH AND CORONARY ANGIOGRAPHY;  Surgeon: Jordan, Peter M, MD;  Location: [facility] INVASIVE CV LAB;  Service: Cardiovascular;  Laterality: N/A;   TEE WITHOUT CARDIOVERSION N/A 09/09/2017    Procedure: TRANSESOPHAGEAL ECHOCARDIOGRAM (TEE);  Surgeon: Army Dallas NOVAK, MD;  Location: [facility] OR;  Service: Open Heart Surgery;  Laterality: N/A;   WISDOM TOOTH EXTRACTION     Social History   Socioeconomic History   Marital status: Married    Spouse name: [name]   Number of children: 2   Years of education: 13   Highest education level: Some college, no degree  Occupational History   Occupation: Retired    Comment: retired Clinical biochemist, [employer]  Tobacco Use   Smoking status: Former    Current packs/day: 0.00    Types: Cigarettes    Quit date: 08/07/1983    Years since quitting: 40.9   Smokeless tobacco: Never  Vaping Use   Vaping status: Never Used  Substance and Sexual Activity   Alcohol use: Yes    Comment: 1 glass of wine daily    Drug use: No   Sexual activity: Not Currently  Other Topics Concern   Not on file  Social History Narrative   Lives with wife   Caffeine use- coffee 3-4 cups daily   Social Drivers of Health   Financial Resource Strain: Low Risk  (03/20/2024)   Overall Financial Resource Strain (CARDIA)    Difficulty of Paying Living Expenses: Not hard at all  Food Insecurity: No Food Insecurity (03/20/2024)   Hunger Vital Sign    Worried About Running Out of Food in the Last Year: Never true    Ran Out of Food in the Last Year: Never true  Transportation Needs: No Transportation Needs (03/20/2024)   PRAPARE - Administrator, Civil Service (Medical): No    Lack of Transportation (Non-Medical): No  Physical Activity: Insufficiently Active (03/20/2024)   Exercise Vital Sign    Days of Exercise per Week: 3 days    Minutes of Exercise per Session: 20 min  Stress: No Stress Concern Present (03/20/2024)   [employer] of Occupational Health - Occupational Stress Questionnaire    Feeling of Stress: Not at all  Social Connections: Unknown (03/20/2024)   Social Connection and Isolation Panel    Frequency of Communication with Friends and Family:  Once a week    Frequency of Social Gatherings with Friends and Family: Once a week    Attends Religious Services: Not on Marketing Executive or Organizations: No    Attends Engineer, Structural: Not on file    Marital Status: Married   Family History  Problem Relation Age of Onset   Cancer Mother        ovarian cancer   Cancer Father        Prostate   Allergies  Allergen Reactions   Viagra [Sildenafil Citrate] Other (See Comments)    COLOR DISCRIMINATION [DOSE RELATED] blue sensation   Metoprolol  Other (See Comments)    Orthostasis, near syncope   Current Outpatient Medications  Medication Sig Dispense Refill   alfuzosin  (UROXATRAL ) 10 MG 24 hr tablet TAKE 1 TABLET DAILY WITH   BREAKFAST 90 tablet 1   aspirin  EC 81  MG tablet Take 1 tablet (81 mg total) by mouth daily. 90 tablet 1   atorvastatin  (LIPITOR) 80 MG tablet Take 1 tablet (80 mg total) by mouth daily. 90 tablet 3   clobetasol  ointment (TEMOVATE ) 0.05 % Apply 1 Application topically 2 (two) times daily.     fexofenadine -pseudoephedrine (ALLEGRA-D 24) 180-240 MG 24 hr tablet Take 1 tablet by mouth as needed (allergies).     fluticasone  (FLONASE ) 50 MCG/ACT nasal spray Place 2 sprays into both nostrils daily. 16 g 0   MAGNESIUM  PO Take by mouth.     nitroGLYCERIN  (NITROLINGUAL ) 0.4 MG/SPRAY spray Place 1 spray under the tongue every 5 (five) minutes x 3 doses as needed for chest pain.     sertraline  (ZOLOFT ) 50 MG tablet TAKE 1 TABLET BY MOUTH EVERY DAY 90 tablet 0   No current facility-administered medications for this visit.   No results found.  Review of Systems:   A ROS was performed including pertinent positives and negatives as documented in the HPI.  Physical Exam :   Constitutional: NAD and appears stated age Neurological: Alert and oriented Psych: Appropriate affect and cooperative There were no vitals taken for this visit.   Comprehensive Musculoskeletal Exam:    Right shoulder  with tenderness about the anterior lateral deltoid positive Neer impingement and forward elevation of 160 degrees with external rotation at side to 50 degrees internal rotation to L1 4 out of 5 strength in forward elevation   Imaging:   Xray (3 views right shoulder): Normal  MRI (right shoulder): Full-thickness tear of the supraspinatus tendon as well as infraspinatus with tearing of the biceps   I personally reviewed and interpreted the radiographs.   Assessment and Plan:   78 y.o. male with full-thickness rotator cuff tear in addition to biceps tearing.  I did discuss that overall he does have essentially no fatty atrophy changes on his T1 sagittal view of the MRI and overall due to the size of the tear and lack of retracted status I do believe he may be a candidate for arthroscopic rotator cuff repair with biceps tenodesis.  I did discuss that I would recommend augmentation with collagen patch as well due to his age and to assist with healing status.  I discussed the risk limitations as well as associated recovery.  After discussion he would like to proceed  -Plan for right shoulder arthroscopy with rotator cuff repair and biceps tenodesis   After a lengthy discussion of treatment options, including risks, benefits, alternatives, complications of surgical and nonsurgical conservative options, the patient elected surgical repair.   The patient  is aware of the material risks  and complications including, but not limited to injury to adjacent structures, neurovascular injury, infection, numbness, bleeding, implant failure, thermal burns, stiffness, persistent pain, failure to heal, disease transmission from allograft, need for further surgery, dislocation, anesthetic risks, blood clots, risks of death,and others. The probabilities of surgical success and failure discussed with patient given their particular co-morbidities.The time and nature of expected rehabilitation and recovery was  discussed.The patient's questions were all answered preoperatively.  No barriers to understanding were noted. I explained the natural history of the disease process and Rx rationale.  I explained to the patient what I considered to be reasonable expectations given their personal situation.  The final treatment plan was arrived at through a shared patient decision making process model.    I personally saw and evaluated the patient, and participated in the management and treatment plan.  Elspeth Parker, MD Attending Physician, Orthopedic Surgery  This document was dictated using Dragon voice recognition software. A reasonable attempt at proof reading has been made to minimize errors.

## 2024-07-20 ENCOUNTER — Telehealth: Payer: Self-pay | Admitting: *Deleted

## 2024-07-20 NOTE — Telephone Encounter (Signed)
° °  Pre-operative Risk Assessment    Patient Name: Leonard Padilla  DOB: [birth year] MRN: [id number]      Request for Surgical Clearance    Procedure:  Right Shoulder Arthroplasty, Rotator Cuff Repair  Date of Surgery:  Clearance TBD                                 Surgeon:  Elspeth Gu, MD Surgeon's Group or Practice Name:  [facility] number:  [phone] Fax number:  [phone]   Type of Clearance Requested:   - Medical  - Pharmacy:  Hold Aspirin  Can she hold for 7 days prior to surgery   Type of Anesthesia:  General    Additional requests/questions:    [name]   07/20/2024, 3:49 PM

## 2024-07-20 NOTE — Telephone Encounter (Signed)
° °  Name: Leonard Padilla  DOB: 09/23/1945  MRN: [id number]  Primary Cardiologist: Revankar (not seen since 10/23/2021)   Chart reviewed as part of pre-operative protocol coverage. Because of PEARLEY BARANEK past medical history and time since last visit, he will require a follow-up in-office visit in order to better assess preoperative cardiovascular risk.  Pre-op covering staff: - Please schedule appointment and call patient to inform them. If patient already had an upcoming appointment within acceptable timeframe, please add pre-op clearance to the appointment notes so provider is aware. - Please contact requesting surgeon's office via preferred method (i.e, phone, fax) to inform them of need for appointment prior to surgery.    [name], NP  07/20/2024, 3:58 PM

## 2024-07-20 NOTE — Telephone Encounter (Signed)
 I will send a message to [facility] scheduling team to ask if they will call the pt with an appt . Pt last seen 2023.

## 2024-07-21 NOTE — Telephone Encounter (Signed)
 Good morning Christina,   I got your message pt wants to wait to be seen in April. So just FYI that will make him a new pt as 10/2024 will be 3 yrs from last seen. Is the pt aware he will not be cleared until he has been seen?  I will update the surgeon office as FYI.  Thank you for your help and update   Thank you  Niels

## 2024-07-21 NOTE — Telephone Encounter (Signed)
 I will update the requesting office to see the notes about appt for the pt and pt request.   If pt is not seen until March or April 2026, best to have surgeon office re-fax preop clearance request to be sure nothing has changed for the procedure.

## 2024-08-16 ENCOUNTER — Other Ambulatory Visit: Payer: Self-pay | Admitting: Family Medicine

## 2024-08-16 DIAGNOSIS — N401 Enlarged prostate with lower urinary tract symptoms: Secondary | ICD-10-CM

## 2024-09-07 ENCOUNTER — Other Ambulatory Visit: Payer: Self-pay | Admitting: Family Medicine

## 2024-09-16 ENCOUNTER — Ambulatory Visit: Admitting: Cardiology

## 2024-09-24 ENCOUNTER — Ambulatory Visit: Admitting: Family Medicine
